# Patient Record
Sex: Female | Born: 1945 | Race: White | Hispanic: No | Marital: Married | Smoking: Never smoker
Health system: Southern US, Community
[De-identification: ages and names within clinical notes are randomized; demographics above are authoritative.]

## PROBLEM LIST (undated history)

## (undated) DIAGNOSIS — J189 Pneumonia, unspecified organism: Secondary | ICD-10-CM

## (undated) DIAGNOSIS — G4733 Obstructive sleep apnea (adult) (pediatric): Secondary | ICD-10-CM

## (undated) DIAGNOSIS — I509 Heart failure, unspecified: Secondary | ICD-10-CM

## (undated) DIAGNOSIS — J479 Bronchiectasis, uncomplicated: Secondary | ICD-10-CM

## (undated) DIAGNOSIS — M8580 Other specified disorders of bone density and structure, unspecified site: Secondary | ICD-10-CM

## (undated) DIAGNOSIS — N189 Chronic kidney disease, unspecified: Secondary | ICD-10-CM

## (undated) DIAGNOSIS — G4730 Sleep apnea, unspecified: Secondary | ICD-10-CM

## (undated) DIAGNOSIS — E119 Type 2 diabetes mellitus without complications: Secondary | ICD-10-CM

## (undated) DIAGNOSIS — C801 Malignant (primary) neoplasm, unspecified: Secondary | ICD-10-CM

## (undated) DIAGNOSIS — H35359 Cystoid macular degeneration, unspecified eye: Secondary | ICD-10-CM

## (undated) DIAGNOSIS — Z860100 Personal history of colon polyps, unspecified: Secondary | ICD-10-CM

## (undated) DIAGNOSIS — M810 Age-related osteoporosis without current pathological fracture: Secondary | ICD-10-CM

## (undated) DIAGNOSIS — I10 Essential (primary) hypertension: Secondary | ICD-10-CM

## (undated) DIAGNOSIS — K219 Gastro-esophageal reflux disease without esophagitis: Secondary | ICD-10-CM

## (undated) DIAGNOSIS — J45909 Unspecified asthma, uncomplicated: Secondary | ICD-10-CM

## (undated) DIAGNOSIS — E785 Hyperlipidemia, unspecified: Secondary | ICD-10-CM

## (undated) DIAGNOSIS — I5030 Unspecified diastolic (congestive) heart failure: Secondary | ICD-10-CM

## (undated) HISTORY — DX: Hyperlipidemia, unspecified: E78.5

## (undated) HISTORY — DX: Heart failure, unspecified: I50.9

## (undated) HISTORY — DX: Personal history of colon polyps, unspecified: Z86.0100

## (undated) HISTORY — DX: Age-related osteoporosis without current pathological fracture: M81.0

## (undated) HISTORY — DX: Type 2 diabetes mellitus without complications: E11.9

## (undated) HISTORY — DX: Cystoid macular degeneration, unspecified eye: H35.359

## (undated) HISTORY — DX: Unspecified diastolic (congestive) heart failure: I50.30

## (undated) HISTORY — DX: Other specified disorders of bone density and structure, unspecified site: M85.80

## (undated) HISTORY — DX: Malignant (primary) neoplasm, unspecified: C80.1

## (undated) HISTORY — DX: Personal history of colonic polyps: Z86.010

## (undated) HISTORY — DX: Bronchiectasis, uncomplicated: J47.9

## (undated) HISTORY — DX: Sleep apnea, unspecified: G47.30

## (undated) HISTORY — DX: Chronic kidney disease, unspecified: N18.9

## (undated) HISTORY — DX: Essential (primary) hypertension: I10

## (undated) HISTORY — DX: Gastro-esophageal reflux disease without esophagitis: K21.9

## (undated) HISTORY — DX: Obstructive sleep apnea (adult) (pediatric): G47.33

## (undated) HISTORY — PX: TUBAL LIGATION: SHX77

## (undated) HISTORY — PX: SHOULDER SURGERY: SHX246

## (undated) HISTORY — PX: FOOT SURGERY: SHX648

## (undated) HISTORY — DX: Pneumonia, unspecified organism: J18.9

## (undated) HISTORY — DX: Unspecified asthma, uncomplicated: J45.909

## (undated) HISTORY — PX: FOOT NEUROMA SURGERY: SHX646

---

## 1998-03-16 ENCOUNTER — Other Ambulatory Visit: Admission: RE | Admit: 1998-03-16 | Discharge: 1998-03-16 | Payer: Self-pay | Admitting: Obstetrics and Gynecology

## 1999-04-08 ENCOUNTER — Other Ambulatory Visit: Admission: RE | Admit: 1999-04-08 | Discharge: 1999-04-08 | Payer: Self-pay | Admitting: Obstetrics and Gynecology

## 1999-06-06 ENCOUNTER — Other Ambulatory Visit: Admission: RE | Admit: 1999-06-06 | Discharge: 1999-06-06 | Payer: Self-pay | Admitting: Obstetrics and Gynecology

## 2000-05-03 ENCOUNTER — Other Ambulatory Visit: Admission: RE | Admit: 2000-05-03 | Discharge: 2000-05-03 | Payer: Self-pay | Admitting: Obstetrics and Gynecology

## 2001-05-17 ENCOUNTER — Other Ambulatory Visit: Admission: RE | Admit: 2001-05-17 | Discharge: 2001-05-17 | Payer: Self-pay | Admitting: Obstetrics and Gynecology

## 2002-07-04 ENCOUNTER — Other Ambulatory Visit: Admission: RE | Admit: 2002-07-04 | Discharge: 2002-07-04 | Payer: Self-pay | Admitting: Obstetrics and Gynecology

## 2003-08-07 ENCOUNTER — Other Ambulatory Visit: Admission: RE | Admit: 2003-08-07 | Discharge: 2003-08-07 | Payer: Self-pay | Admitting: Obstetrics and Gynecology

## 2004-08-19 ENCOUNTER — Other Ambulatory Visit: Admission: RE | Admit: 2004-08-19 | Discharge: 2004-08-19 | Payer: Self-pay | Admitting: Obstetrics and Gynecology

## 2012-05-07 DIAGNOSIS — I10 Essential (primary) hypertension: Secondary | ICD-10-CM | POA: Diagnosis not present

## 2012-05-23 DIAGNOSIS — I10 Essential (primary) hypertension: Secondary | ICD-10-CM | POA: Diagnosis not present

## 2012-05-23 DIAGNOSIS — E782 Mixed hyperlipidemia: Secondary | ICD-10-CM | POA: Diagnosis not present

## 2012-05-23 DIAGNOSIS — K219 Gastro-esophageal reflux disease without esophagitis: Secondary | ICD-10-CM | POA: Diagnosis not present

## 2012-05-23 DIAGNOSIS — K5900 Constipation, unspecified: Secondary | ICD-10-CM | POA: Diagnosis not present

## 2012-05-23 DIAGNOSIS — Z79899 Other long term (current) drug therapy: Secondary | ICD-10-CM | POA: Diagnosis not present

## 2012-05-23 DIAGNOSIS — R635 Abnormal weight gain: Secondary | ICD-10-CM | POA: Diagnosis not present

## 2012-06-27 DIAGNOSIS — Z713 Dietary counseling and surveillance: Secondary | ICD-10-CM | POA: Diagnosis not present

## 2012-06-27 DIAGNOSIS — R635 Abnormal weight gain: Secondary | ICD-10-CM | POA: Diagnosis not present

## 2012-07-23 DIAGNOSIS — E669 Obesity, unspecified: Secondary | ICD-10-CM | POA: Diagnosis not present

## 2012-07-23 DIAGNOSIS — Z713 Dietary counseling and surveillance: Secondary | ICD-10-CM | POA: Diagnosis not present

## 2012-07-26 DIAGNOSIS — Z79899 Other long term (current) drug therapy: Secondary | ICD-10-CM | POA: Diagnosis not present

## 2012-07-26 DIAGNOSIS — Z124 Encounter for screening for malignant neoplasm of cervix: Secondary | ICD-10-CM | POA: Diagnosis not present

## 2012-07-26 DIAGNOSIS — K5900 Constipation, unspecified: Secondary | ICD-10-CM | POA: Diagnosis not present

## 2012-07-26 DIAGNOSIS — G4700 Insomnia, unspecified: Secondary | ICD-10-CM | POA: Diagnosis not present

## 2012-07-26 DIAGNOSIS — Z780 Asymptomatic menopausal state: Secondary | ICD-10-CM | POA: Diagnosis not present

## 2012-07-26 DIAGNOSIS — N951 Menopausal and female climacteric states: Secondary | ICD-10-CM | POA: Diagnosis not present

## 2012-07-26 DIAGNOSIS — E782 Mixed hyperlipidemia: Secondary | ICD-10-CM | POA: Diagnosis not present

## 2012-07-26 DIAGNOSIS — I10 Essential (primary) hypertension: Secondary | ICD-10-CM | POA: Diagnosis not present

## 2012-07-26 DIAGNOSIS — Z1211 Encounter for screening for malignant neoplasm of colon: Secondary | ICD-10-CM | POA: Diagnosis not present

## 2012-08-15 DIAGNOSIS — Z1231 Encounter for screening mammogram for malignant neoplasm of breast: Secondary | ICD-10-CM | POA: Diagnosis not present

## 2012-08-28 DIAGNOSIS — Z713 Dietary counseling and surveillance: Secondary | ICD-10-CM | POA: Diagnosis not present

## 2012-08-28 DIAGNOSIS — R635 Abnormal weight gain: Secondary | ICD-10-CM | POA: Diagnosis not present

## 2012-08-29 DIAGNOSIS — G4733 Obstructive sleep apnea (adult) (pediatric): Secondary | ICD-10-CM | POA: Diagnosis not present

## 2012-09-12 DIAGNOSIS — R6889 Other general symptoms and signs: Secondary | ICD-10-CM | POA: Diagnosis not present

## 2012-09-17 ENCOUNTER — Ambulatory Visit (INDEPENDENT_AMBULATORY_CARE_PROVIDER_SITE_OTHER): Payer: Medicare Other | Admitting: Pulmonary Disease

## 2012-09-17 ENCOUNTER — Encounter: Payer: Self-pay | Admitting: Pulmonary Disease

## 2012-09-17 ENCOUNTER — Ambulatory Visit (HOSPITAL_BASED_OUTPATIENT_CLINIC_OR_DEPARTMENT_OTHER): Payer: Medicare Other | Attending: Pulmonary Disease | Admitting: Radiology

## 2012-09-17 VITALS — BP 108/74 | HR 88 | Temp 97.7°F | Ht 63.0 in | Wt 183.0 lb

## 2012-09-17 VITALS — Ht 63.0 in | Wt 179.0 lb

## 2012-09-17 DIAGNOSIS — G4709 Other insomnia: Secondary | ICD-10-CM | POA: Insufficient documentation

## 2012-09-17 DIAGNOSIS — I10 Essential (primary) hypertension: Secondary | ICD-10-CM | POA: Insufficient documentation

## 2012-09-17 DIAGNOSIS — E669 Obesity, unspecified: Secondary | ICD-10-CM | POA: Diagnosis not present

## 2012-09-17 DIAGNOSIS — R5381 Other malaise: Secondary | ICD-10-CM | POA: Diagnosis not present

## 2012-09-17 DIAGNOSIS — R6889 Other general symptoms and signs: Secondary | ICD-10-CM | POA: Insufficient documentation

## 2012-09-17 DIAGNOSIS — G4733 Obstructive sleep apnea (adult) (pediatric): Secondary | ICD-10-CM | POA: Insufficient documentation

## 2012-09-17 DIAGNOSIS — G4730 Sleep apnea, unspecified: Secondary | ICD-10-CM | POA: Diagnosis not present

## 2012-09-17 DIAGNOSIS — R002 Palpitations: Secondary | ICD-10-CM | POA: Diagnosis not present

## 2012-09-17 DIAGNOSIS — R0683 Snoring: Secondary | ICD-10-CM

## 2012-09-17 DIAGNOSIS — R0989 Other specified symptoms and signs involving the circulatory and respiratory systems: Secondary | ICD-10-CM | POA: Insufficient documentation

## 2012-09-17 DIAGNOSIS — R0609 Other forms of dyspnea: Secondary | ICD-10-CM | POA: Insufficient documentation

## 2012-09-17 DIAGNOSIS — K219 Gastro-esophageal reflux disease without esophagitis: Secondary | ICD-10-CM | POA: Insufficient documentation

## 2012-09-17 DIAGNOSIS — G4700 Insomnia, unspecified: Secondary | ICD-10-CM | POA: Diagnosis not present

## 2012-09-17 HISTORY — DX: Obstructive sleep apnea (adult) (pediatric): G47.33

## 2012-09-17 NOTE — Assessment & Plan Note (Signed)
She reports snoring, sleep disruption, and daytime sleepiness.  She has history of refractory hypertension.  She reports recent home sleep study which was negative for sleep apnea, but showed oxygen desaturation >> I explained limitation of home sleep studies, and how these can underestimate presence of sleep apnea.  I am still concerned she could have sleep apnea.  We discussed how sleep apnea can affect various health problems including risks for hypertension, cardiovascular disease, and diabetes.  We also discussed how sleep disruption can increase risks for accident, such as while driving.  Weight loss as a means of improving sleep apnea was also reviewed.  Additional treatment options discussed were CPAP therapy, oral appliance, and surgical intervention.  Will get copy of her home sleep study.  Will also arrange for in lab sleep study to better assess presence of sleep apnea.

## 2012-09-17 NOTE — Patient Instructions (Signed)
Will arrange for sleep study Will call to arrange for follow up after sleep study reviewed 

## 2012-09-17 NOTE — Assessment & Plan Note (Signed)
Explained how nocturnal palpitations can be related to sleep apnea.  Will further assess her heart rhythm during sleep study.

## 2012-09-17 NOTE — Assessment & Plan Note (Signed)
Explained how nocturnal reflux can be associated with sleep apnea.

## 2012-09-17 NOTE — Assessment & Plan Note (Signed)
She is to continue elavil and melatonin for now.  Will re-assess her sleep aide requirements after review of her in lab sleep study.

## 2012-09-17 NOTE — Assessment & Plan Note (Signed)
Explained how sleep apnea can affect blood pressure control.

## 2012-09-17 NOTE — Progress Notes (Signed)
Chief Complaint  Patient presents with  . Sleep Consult    Epworth Score: 3.    History of Present Illness: Diana Hendrix is a 67 y.o. female for evaluation of sleep problems.  She has noticed trouble waking up feeling like her heart is beating hard.  This has been present for years, but seems to be happening more frequently.  She was also found to have refractory hypertension after developing a nose bleed.  She was seen by a nephrologist to further assess/treat her hypertension, and is now on multiple medications for her blood pressure control.  She does snore also.  There was concern she could have sleep apnea.  She had a home sleep study recently >> this is not available at present.  She was told her sleep apnea number was not high enough, but her oxygen level was low.  As a result she was referred to pulmonary/sleep medicine for further assessment.  She goes to sleep at 9 pm after taking elavil and melantonin at 730 pm.  She has been using these medications for years.  She falls asleep after 10 to 15 minutes.  She wakes up several times to use the bathroom.  She gets out of bed at 630 am.  She feels okay in the morning, but will get sleepy later in the afternoon >> this is not as much of a problem since she retired.  She denies morning headache.  She does not use anything to help her stay awake.  She will occasional take ativan during the day to help with her anxiety, but does not use this to help sleep.  She is unable to sleep on her back, and her mouth gets dry when she is asleep.  She denies sleep walking, sleep talking, bruxism, or nightmares.  There is no history of restless legs.  She denies sleep hallucinations, sleep paralysis, or cataplexy.  The Epworth score is 3 out of 24.  Yeily Link Stainback  has a past medical history of Hypertension; Hyperlipidemia; GERD (gastroesophageal reflux disease); and Macular edema, cystoid.  Toree Edling Lempke  has past surgical history that includes  Cesarean section; Tubal ligation; and Shoulder surgery.  Prior to Admission medications   Medication Sig Start Date End Date Taking? Authorizing Provider  amitriptyline (ELAVIL) 10 MG tablet Take 20 mg by mouth at bedtime.   Yes Historical Provider, MD  amLODipine (NORVASC) 5 MG tablet Take 5 mg by mouth daily.   Yes Historical Provider, MD  calcium carbonate (OS-CAL) 600 MG TABS tablet Take 600 mg by mouth 2 (two) times daily with a meal.   Yes Historical Provider, MD  Cholecalciferol (VITAMIN D-3) 1000 UNITS CAPS Take 2 capsules by mouth daily.   Yes Historical Provider, MD  fish oil-omega-3 fatty acids 1000 MG capsule Take 2 g by mouth daily.   Yes Historical Provider, MD  LORazepam (ATIVAN) 0.5 MG tablet Take 0.5 mg by mouth every 8 (eight) hours.   Yes Historical Provider, MD  Melatonin 5 MG TABS Take 1 tablet by mouth at bedtime.   Yes Historical Provider, MD  metoprolol succinate (TOPROL-XL) 50 MG 24 hr tablet Take 50 mg by mouth. Take with or immediately following a meal.   Yes Historical Provider, MD  olmesartan-hydrochlorothiazide (BENICAR HCT) 40-12.5 MG per tablet Take 1 tablet by mouth daily.   Yes Historical Provider, MD  pantoprazole (PROTONIX) 40 MG tablet Take 40 mg by mouth daily.   Yes Historical Provider, MD  simvastatin (ZOCOR) 10 MG  tablet Take 10 mg by mouth at bedtime.   Yes Historical Provider, MD  tobramycin-dexamethasone [facility]) ophthalmic solution 1 drop every 4 (four) hours while awake.   Yes Historical Provider, MD    No Known Allergies  Her family history includes Atrial fibrillation in her mother; Diabetes in her mother; Hypertension in her mother.  She  reports that she has never smoked. She has never used smokeless tobacco. She reports that she does not drink alcohol or use illicit drugs.   Physical Exam:  General - No distress ENT - No sinus tenderness, no oral exudate, MP 4, scalloped tongue, high arch palate, retrognathic, no LAN, no thyromegaly, TM  clear, pupils equal/reactive Cardiac - s1s2 regular, no murmur, pulses symmetric Chest - No wheeze/rales/dullness, good air entry, normal respiratory excursion Back - No focal tenderness Abd - Soft, non-tender, no organomegaly, + bowel sounds Ext - No edema Neuro - Normal strength, cranial nerves intact Skin - No rashes Psych - Normal mood, and behavior  Assessment:  Coralyn Helling, MD [facility] Pulmonary/Critical Care 09/17/2012, 3:57 PM Pager:  [phone] After 3pm call: [phone]

## 2012-09-17 NOTE — Progress Notes (Signed)
  Subjective:    Patient ID: Diana Hendrix, female    DOB: 05-02-1945, 67 y.o.   MRN: [id number]  HPI    Review of Systems  Constitutional: Negative for fever, chills, diaphoresis, activity change, appetite change, fatigue and unexpected weight change.  HENT: Negative for hearing loss, ear pain, nosebleeds, congestion, sore throat, facial swelling, rhinorrhea, sneezing, mouth sores, trouble swallowing, neck pain, neck stiffness, dental problem, voice change, postnasal drip, sinus pressure, tinnitus and ear discharge.   Eyes: Negative for photophobia, discharge, itching and visual disturbance.  Respiratory: Negative for apnea, cough, choking, chest tightness, shortness of breath, wheezing and stridor.   Cardiovascular: Negative for chest pain, palpitations and leg swelling.  Gastrointestinal: Negative for nausea, vomiting, abdominal pain, constipation, blood in stool and abdominal distention.  Genitourinary: Negative for dysuria, urgency, frequency, hematuria, flank pain, decreased urine volume and difficulty urinating.  Musculoskeletal: Negative for myalgias, back pain, joint swelling, arthralgias and gait problem.  Skin: Negative for color change, pallor and rash.  Neurological: Negative for dizziness, tremors, seizures, syncope, speech difficulty, weakness, light-headedness, numbness and headaches.  Hematological: Negative for adenopathy. Does not bruise/bleed easily.  Psychiatric/Behavioral: Negative for confusion, sleep disturbance and agitation. The patient is not nervous/anxious.        Objective:   Physical Exam        Assessment & Plan:

## 2012-09-19 DIAGNOSIS — G4733 Obstructive sleep apnea (adult) (pediatric): Secondary | ICD-10-CM

## 2012-09-19 NOTE — Procedures (Signed)
NAME:  Diana, Hendrix NO.:  [id number]  MEDICAL RECORD NO.:  [id number]          PATIENT TYPE:  OUT  LOCATION:  SLEEP CENTER                 FACILITY:  [facility]  PHYSICIAN:  Coralyn Helling, MD        DATE OF BIRTH:  Aug 24, 1945  DATE OF STUDY:  09/17/2012                           NOCTURNAL POLYSOMNOGRAM  REFERRING PHYSICIAN:  Coralyn Helling, MD  FACILITY:  [facility].  REFERRING PHYSICIAN:  Coralyn Helling, MD  INDICATION:  Ms. Simerson is a 67 year old female, who has a history of hypertension.  She also reports snoring, sleep disruption, and daytime sleepiness.  She is referred to sleep lab for evaluation of hypersomnia with obstructive sleep apnea.  Height is 5 feet 3 inches, weight is 139 pounds.  BMI is 32.  Neck size is 14.5 inches.  Medications are reviewed in her chart.  EPWORTH SLEEPINESS SCORE:  2.  SLEEP ARCHITECTURE:  Total recording time was 377 minutes.  Total sleep time was 262 minutes, sleep efficiency was 69%.  Sleep latency was 21 minutes.  REM latency was 253 minutes.  The patient was observed in all stages of sleep and she slept predominantly in the nonsupine position. Of note is that she had difficulty with sleep initiation and sleep maintenance due to respiratory events.  RESPIRATORY DATA:  The average respiratory rate was 14.  Moderate snoring was noted by the technician.  The respiratory disturbance index was 13.7.  There was 1 central apneic event.  The remainder of the events were obstructive in nature.  OXYGEN DATA:  The baseline oxygenation was 98%.  The oxygen saturation nadir was 90%.  The patient had the study conducted without the use of supplemental oxygen.  CARDIAC DATA:  The average heart rate was 52 and the rhythm strip showed sinus rhythm with occasional PVCs and PACs.  MOVEMENT/PARASOMNIA:  The periodic limb movement index was 0 and the patient had 1 restroom trip.  IMPRESSION:  This study shows evidence of mild  obstructive sleep apnea with a respiratory disturbance index of 13.7 and oxygen saturation nadir of 90%.  Additional therapeutic interventions include weight reduction, CPAP therapy, oral appliance, or surgical intervention.     Coralyn Helling, MD Diplomat, American Board of Sleep Medicine    VS/MEDQ  D:  09/19/2012 11:25:34  T:  09/19/2012 16:10:96  Job:  [id number]

## 2012-09-23 ENCOUNTER — Telehealth: Payer: Self-pay | Admitting: Pulmonary Disease

## 2012-09-23 NOTE — Telephone Encounter (Signed)
Pt returned call & can be reached at [phone].  Diana Hendrix

## 2012-09-23 NOTE — Telephone Encounter (Signed)
PSG 09/17/12 >> RDI 13.7, SpO2 low 905, PLMI 0, occasional PVC's and PAC's.  Will have my nurse inform pt that sleep study shows mild sleep apnea, and she needs ROV to discuss in more detail.

## 2012-09-23 NOTE — Telephone Encounter (Signed)
I spoke with pt and appt has been scheduled. Nothing further needed

## 2012-09-23 NOTE — Telephone Encounter (Signed)
lmtcb

## 2012-09-24 DIAGNOSIS — H04129 Dry eye syndrome of unspecified lacrimal gland: Secondary | ICD-10-CM | POA: Diagnosis not present

## 2012-09-24 DIAGNOSIS — H01009 Unspecified blepharitis unspecified eye, unspecified eyelid: Secondary | ICD-10-CM | POA: Diagnosis not present

## 2012-09-24 DIAGNOSIS — H2510 Age-related nuclear cataract, unspecified eye: Secondary | ICD-10-CM | POA: Diagnosis not present

## 2012-10-25 ENCOUNTER — Ambulatory Visit (INDEPENDENT_AMBULATORY_CARE_PROVIDER_SITE_OTHER): Payer: Medicare Other | Admitting: Pulmonary Disease

## 2012-10-25 ENCOUNTER — Encounter: Payer: Self-pay | Admitting: Pulmonary Disease

## 2012-10-25 VITALS — BP 112/72 | HR 60 | Temp 97.7°F | Ht 63.0 in | Wt 182.0 lb

## 2012-10-25 DIAGNOSIS — G4733 Obstructive sleep apnea (adult) (pediatric): Secondary | ICD-10-CM

## 2012-10-25 DIAGNOSIS — G4700 Insomnia, unspecified: Secondary | ICD-10-CM | POA: Diagnosis not present

## 2012-10-25 DIAGNOSIS — Z23 Encounter for immunization: Secondary | ICD-10-CM | POA: Diagnosis not present

## 2012-10-25 NOTE — Progress Notes (Signed)
Chief Complaint  Patient presents with  . Follow-up    Review sleep study results.    CC: Diana Hendrix, [facility] Nephrology  History of Present Illness: Diana Hendrix is a 67 y.o. female with OSA.  She is here to review her sleep study.  This shows mild sleep apnea.  She continues to have trouble with her sleep.  She continues to take ativan and elavil to help her sleep.  TESTS: PSG 09/17/12 >> RDI 13.7, SpO2 low 90%, PLMI 0, occasional PVC's and PAC's.   Diana Hendrix  has a past medical history of Hypertension; Hyperlipidemia; GERD (gastroesophageal reflux disease); and Macular edema, cystoid.  Diana Hendrix  has past surgical history that includes Cesarean section; Tubal ligation; and Shoulder surgery.  Prior to Admission medications   Medication Sig Start Date End Date Taking? Authorizing Provider  amitriptyline (ELAVIL) 10 MG tablet Take 20 mg by mouth at bedtime.   Yes Historical Provider, MD  amLODipine (NORVASC) 5 MG tablet Take 5 mg by mouth daily.   Yes Historical Provider, MD  calcium carbonate (OS-CAL) 600 MG TABS tablet Take 600 mg by mouth 2 (two) times daily with a meal.   Yes Historical Provider, MD  Cholecalciferol (VITAMIN D-3) 1000 UNITS CAPS Take 2 capsules by mouth daily.   Yes Historical Provider, MD  fish oil-omega-3 fatty acids 1000 MG capsule Take 2 g by mouth daily.   Yes Historical Provider, MD  LORazepam (ATIVAN) 0.5 MG tablet Take 0.5 mg by mouth every 8 (eight) hours.   Yes Historical Provider, MD  Melatonin 5 MG TABS Take 1 tablet by mouth at bedtime.   Yes Historical Provider, MD  metoprolol succinate (TOPROL-XL) 50 MG 24 hr tablet Take 50 mg by mouth 3 (three) times daily. Take with or immediately following a meal.   Yes Historical Provider, MD  olmesartan-hydrochlorothiazide (BENICAR HCT) 40-12.5 MG per tablet Take 1 tablet by mouth daily.   Yes Historical Provider, MD  pantoprazole (PROTONIX) 40 MG tablet Take 40 mg by mouth daily.   Yes  Historical Provider, MD  simvastatin (ZOCOR) 10 MG tablet Take 10 mg by mouth at bedtime.   Yes Historical Provider, MD  tobramycin-dexamethasone [facility]) ophthalmic solution 1 drop every 4 (four) hours while awake.   Yes Historical Provider, MD    No Known Allergies   Physical Exam:  General - No distress ENT - No sinus tenderness, no oral exudate, no LAN, MP 4, scalloped tongue, high arch palate, retrognathic Cardiac - s1s2 regular, no murmur Chest - No wheeze/rales/dullness Back - No focal tenderness Abd - Soft, non-tender Ext - No edema Neuro - Normal strength Skin - No rashes Psych - normal mood, and behavior   Assessment/Plan:  Coralyn Helling, MD [place] Pulmonary/Critical Care/Sleep Pager:  [phone]

## 2012-10-25 NOTE — Assessment & Plan Note (Signed)
Explained how some of her symptoms of insomnia could in fact be related to sleep apnea.  Will continue elavil and ativan for now.  Will re-assess her need for sleep aide after she is established on therapy for sleep apnea.

## 2012-10-25 NOTE — Assessment & Plan Note (Signed)
She has mild sleep apnea.  This is in the setting of refractory hypertension.  I have reviewed the recent sleep study results with the patient.  We discussed how sleep apnea can affect various health problems including risks for hypertension, cardiovascular disease, and diabetes.  We also discussed how sleep disruption can increase risks for accident, such as while driving.  Weight loss as a means of improving sleep apnea was also reviewed.  Additional treatment options discussed were CPAP therapy, oral appliance, and surgical intervention.  She would like to try arranging for oral appliance first.  Will arrange for referral to Dr. Althea Grimmer to assess for oral appliance to treat her obstructive sleep apnea.

## 2012-10-25 NOTE — Patient Instructions (Signed)
Will arrange for referral to Dr. Mark Katz to assess for oral appliance to treat obstructive sleep apnea  Follow up in 6 months 

## 2012-10-28 DIAGNOSIS — I10 Essential (primary) hypertension: Secondary | ICD-10-CM | POA: Diagnosis not present

## 2012-10-28 DIAGNOSIS — E7800 Pure hypercholesterolemia, unspecified: Secondary | ICD-10-CM | POA: Diagnosis not present

## 2012-10-28 DIAGNOSIS — N189 Chronic kidney disease, unspecified: Secondary | ICD-10-CM | POA: Diagnosis not present

## 2012-10-28 DIAGNOSIS — R300 Dysuria: Secondary | ICD-10-CM | POA: Diagnosis not present

## 2012-11-18 DIAGNOSIS — H2510 Age-related nuclear cataract, unspecified eye: Secondary | ICD-10-CM | POA: Diagnosis not present

## 2012-11-18 DIAGNOSIS — H02839 Dermatochalasis of unspecified eye, unspecified eyelid: Secondary | ICD-10-CM | POA: Diagnosis not present

## 2012-11-18 DIAGNOSIS — H04129 Dry eye syndrome of unspecified lacrimal gland: Secondary | ICD-10-CM | POA: Diagnosis not present

## 2012-11-26 DIAGNOSIS — H02839 Dermatochalasis of unspecified eye, unspecified eyelid: Secondary | ICD-10-CM | POA: Diagnosis not present

## 2012-11-26 DIAGNOSIS — H04129 Dry eye syndrome of unspecified lacrimal gland: Secondary | ICD-10-CM | POA: Diagnosis not present

## 2012-11-26 DIAGNOSIS — H2510 Age-related nuclear cataract, unspecified eye: Secondary | ICD-10-CM | POA: Diagnosis not present

## 2013-01-03 DIAGNOSIS — H2510 Age-related nuclear cataract, unspecified eye: Secondary | ICD-10-CM | POA: Diagnosis not present

## 2013-01-03 DIAGNOSIS — H269 Unspecified cataract: Secondary | ICD-10-CM | POA: Diagnosis not present

## 2013-01-24 DIAGNOSIS — H2510 Age-related nuclear cataract, unspecified eye: Secondary | ICD-10-CM | POA: Diagnosis not present

## 2013-01-24 DIAGNOSIS — H269 Unspecified cataract: Secondary | ICD-10-CM | POA: Diagnosis not present

## 2013-03-24 DIAGNOSIS — G4700 Insomnia, unspecified: Secondary | ICD-10-CM | POA: Diagnosis not present

## 2013-04-24 ENCOUNTER — Encounter: Payer: Self-pay | Admitting: Pulmonary Disease

## 2013-04-24 ENCOUNTER — Ambulatory Visit (INDEPENDENT_AMBULATORY_CARE_PROVIDER_SITE_OTHER): Payer: Medicare Other | Admitting: Pulmonary Disease

## 2013-04-24 VITALS — BP 118/72 | HR 60 | Ht 63.0 in | Wt 187.0 lb

## 2013-04-24 DIAGNOSIS — G4733 Obstructive sleep apnea (adult) (pediatric): Secondary | ICD-10-CM | POA: Diagnosis not present

## 2013-04-24 NOTE — Assessment & Plan Note (Signed)
Unfortunately she was unable to tolerate oral appliance.  We again discussed trying CPAP.  Also discussed how most of her nocturnal symptoms are likely related to sleep apnea.  Also discussed how her blood pressure control can be effected by sleep apnea.  She has f/u with her nephrologist next week, and would like to discuss with her first about how much treating sleep apnea can help her blood pressure control.  She will then call our office to inform whether she would want to try CPAP.  If she is agreeable to try CPAP, then will arrange for auto CPAP set up.

## 2013-04-24 NOTE — Progress Notes (Signed)
Chief Complaint  Patient presents with  . Sleep Apnea    Went to Dr. Ron Hendrix for oral appliance, has not been using due to it causing problems with her teeth    CC: Diana Hendrix, [facility] Nephrology  History of Present Illness: Diana Hendrix is a 68 y.o. female with OSA.  She was seen by Dr. Oneal Hendrix, and fitted for oral appliance.  She feels this helped her sleep.  Unfortunately she developed dental malalignment, and was advised by Dr. Ron Hendrix to stop using oral appliance.  She has started snoring again, and her husband thinks this is worse than before.  She also wakes up feeling like she is having palpitations, and wakes up hearing herself snore.  Her husband has sleep apnea, and uses CPAP > he has struggled with his set up, and she is concerned about whether she could use CPAP.  TESTS: PSG 09/17/12 >> RDI 13.7, SpO2 low 90%, PLMI 0, occasional PVC's and PAC's.  Diana Hendrix  has a past medical history of Hypertension; Hyperlipidemia; GERD (gastroesophageal reflux disease); Macular edema, cystoid; and OSA (obstructive sleep apnea) (09/17/2012).  Diana Hendrix  has past surgical history that includes Cesarean section; Tubal ligation; and Shoulder surgery.  Prior to Admission medications   Medication Sig Start Date End Date Taking? Authorizing Provider  amitriptyline (ELAVIL) 10 MG tablet Take 20 mg by mouth at bedtime.   Yes Historical Provider, MD  amLODipine (NORVASC) 5 MG tablet Take 5 mg by mouth daily.   Yes Historical Provider, MD  calcium carbonate (OS-CAL) 600 MG TABS tablet Take 600 mg by mouth 2 (two) times daily with a meal.   Yes Historical Provider, MD  Cholecalciferol (VITAMIN D-3) 1000 UNITS CAPS Take 2 capsules by mouth daily.   Yes Historical Provider, MD  fish oil-omega-3 fatty acids 1000 MG capsule Take 2 g by mouth daily.   Yes Historical Provider, MD  LORazepam (ATIVAN) 0.5 MG tablet Take 0.5 mg by mouth every 8 (eight) hours.   Yes Historical Provider, MD   Melatonin 5 MG TABS Take 1 tablet by mouth at bedtime.   Yes Historical Provider, MD  metoprolol succinate (TOPROL-XL) 50 MG 24 hr tablet Take 50 mg by mouth 3 (three) times daily. Take with or immediately following a meal.   Yes Historical Provider, MD  olmesartan-hydrochlorothiazide (BENICAR HCT) 40-12.5 MG per tablet Take 1 tablet by mouth daily.   Yes Historical Provider, MD  pantoprazole (PROTONIX) 40 MG tablet Take 40 mg by mouth daily.   Yes Historical Provider, MD  simvastatin (ZOCOR) 10 MG tablet Take 10 mg by mouth at bedtime.   Yes Historical Provider, MD  tobramycin-dexamethasone [facility]) ophthalmic solution 1 drop every 4 (four) hours while awake.   Yes Historical Provider, MD    No Known Allergies   Physical Exam:  General - No distress ENT - No sinus tenderness, no oral exudate, no LAN, MP 4, scalloped tongue, high arch palate, retrognathic Cardiac - s1s2 regular, no murmur Chest - No wheeze/rales/dullness Back - No focal tenderness Abd - Soft, non-tender Ext - No edema Neuro - Normal strength Skin - No rashes Psych - normal mood, and behavior   Assessment/Plan:  Diana Mires, MD Diana Hendrix Pulmonary/Critical Care/Sleep Pager:  [phone]

## 2013-04-24 NOTE — Patient Instructions (Signed)
Call once you have decide if you want to use CPAP Follow up in 3 months

## 2013-04-28 ENCOUNTER — Telehealth: Payer: Self-pay | Admitting: Pulmonary Disease

## 2013-04-28 DIAGNOSIS — G4733 Obstructive sleep apnea (adult) (pediatric): Secondary | ICD-10-CM

## 2013-04-28 DIAGNOSIS — N189 Chronic kidney disease, unspecified: Secondary | ICD-10-CM | POA: Diagnosis not present

## 2013-04-28 DIAGNOSIS — I10 Essential (primary) hypertension: Secondary | ICD-10-CM | POA: Diagnosis not present

## 2013-04-28 DIAGNOSIS — D649 Anemia, unspecified: Secondary | ICD-10-CM | POA: Diagnosis not present

## 2013-04-28 DIAGNOSIS — E039 Hypothyroidism, unspecified: Secondary | ICD-10-CM | POA: Diagnosis not present

## 2013-04-28 DIAGNOSIS — R300 Dysuria: Secondary | ICD-10-CM | POA: Diagnosis not present

## 2013-04-28 NOTE — Telephone Encounter (Signed)
Spoke with pt. She saw her Nephrologist and has decided to try CPAP. Advised her that we will send this message to VS and determine what pressure she should be set at.  VS - please advise. Thanks.

## 2013-04-28 NOTE — Telephone Encounter (Signed)
Please inform pt that I have sent order for Auto CPAP set up.

## 2013-04-29 DIAGNOSIS — R0902 Hypoxemia: Secondary | ICD-10-CM | POA: Diagnosis not present

## 2013-04-30 DIAGNOSIS — R0902 Hypoxemia: Secondary | ICD-10-CM | POA: Diagnosis not present

## 2013-05-07 ENCOUNTER — Telehealth: Payer: Self-pay | Admitting: Pulmonary Disease

## 2013-05-07 NOTE — Telephone Encounter (Signed)
Note   Order faxed to [facility]                          Type Date User    Provider Comments 04/28/2013 4:03 PM SOOD, Elisabeth Cara         Summary    Provider Comments         Note    New CPAP set. Please arrange for auto CPAP range 5 to 15 cm H2O with heated humidity and mask of choice. Please have download sent after two weeks use.       I spoke with rep at [facility] who stated that they needed OV notes from when patient was first seen and the most recent OV notes showing patient still needs CPAP therapy. I have faxed the OV notes from 09/17/12 and 04/24/13. They said the patient should hear from their office within a week at the latest.   I had to leave a message for patient to call us back to inform her of this.

## 2013-05-08 NOTE — Telephone Encounter (Signed)
Noted  

## 2013-05-08 NOTE — Telephone Encounter (Signed)
Pt returned call to triage.

## 2013-05-08 NOTE — Telephone Encounter (Signed)
Called spoke w/ pt. Made her aware. She reports her PCP ordered an ONO and had this done. Showed O2 level dropped below 80's several times every night. PCP did not order O2 since she told them we were ordering CPAP for her. She reports they were suppose to send [place] the reports yesterday. Will forward to Dr. Halford Chessman and Ria Comment so they can look out for results on pt. thanks

## 2013-05-27 DIAGNOSIS — I10 Essential (primary) hypertension: Secondary | ICD-10-CM | POA: Diagnosis not present

## 2013-05-27 DIAGNOSIS — E7800 Pure hypercholesterolemia, unspecified: Secondary | ICD-10-CM | POA: Diagnosis not present

## 2013-05-27 DIAGNOSIS — K219 Gastro-esophageal reflux disease without esophagitis: Secondary | ICD-10-CM | POA: Diagnosis not present

## 2013-05-27 DIAGNOSIS — G4700 Insomnia, unspecified: Secondary | ICD-10-CM | POA: Diagnosis not present

## 2013-05-29 ENCOUNTER — Telehealth: Payer: Self-pay | Admitting: Pulmonary Disease

## 2013-05-29 NOTE — Telephone Encounter (Signed)
Pt is aware of results. 

## 2013-05-29 NOTE — Telephone Encounter (Signed)
Auto CPAP 05/14/13 to 05/27/13 >> used on 14 of 14 nights with average 8 hrs 10 min. Average AHI 1.9 with median CPAP 9 cm H2O and 95 th percentile CPAP 12 cm H2O.  Will have my nurse inform pt that CPAP report looks good.  No change to current set up.  Will discuss in more detail at next ROV which needs to be two months after initial CPAP set up.

## 2013-06-30 DIAGNOSIS — I10 Essential (primary) hypertension: Secondary | ICD-10-CM | POA: Diagnosis not present

## 2013-06-30 DIAGNOSIS — N189 Chronic kidney disease, unspecified: Secondary | ICD-10-CM | POA: Diagnosis not present

## 2013-07-04 ENCOUNTER — Telehealth: Payer: Self-pay | Admitting: Pulmonary Disease

## 2013-07-04 NOTE — Telephone Encounter (Signed)
Called spoke with Jeneen Rinks. He reports the sleep study they received VS signature was not on it. He needs this faxed to [phone]. I have printed off sleep study and will have libby look over this with me.

## 2013-07-04 NOTE — Telephone Encounter (Signed)
refaxed to final report to [facility]

## 2013-07-04 NOTE — Telephone Encounter (Signed)
Per libby send to [facility].

## 2013-08-03 ENCOUNTER — Encounter: Payer: Self-pay | Admitting: Pulmonary Disease

## 2013-08-08 ENCOUNTER — Ambulatory Visit (INDEPENDENT_AMBULATORY_CARE_PROVIDER_SITE_OTHER): Payer: Medicare Other | Admitting: Pulmonary Disease

## 2013-08-08 ENCOUNTER — Encounter: Payer: Self-pay | Admitting: Pulmonary Disease

## 2013-08-08 VITALS — BP 142/88 | HR 65 | Temp 98.0°F | Ht 63.0 in | Wt 192.0 lb

## 2013-08-08 DIAGNOSIS — G4733 Obstructive sleep apnea (adult) (pediatric): Secondary | ICD-10-CM | POA: Diagnosis not present

## 2013-08-08 NOTE — Patient Instructions (Signed)
Follow up in 1 year.

## 2013-08-08 NOTE — Assessment & Plan Note (Signed)
She is compliant with CPAP and reports benefit.  Her main issue is with mouth dryness.  She can continue biotene gel.  Discussed humidifier adjustment and trying chin strap or full face mask.  She can use nasal irrigation and nasacort prn for sinus congestion.

## 2013-08-08 NOTE — Progress Notes (Signed)
Chief Complaint  Patient presents with  . Follow-up    Pt c/o increased dry mouth/throat in AM. Pt also states that she has a cold which is causing problems with CPPA. Pt states that the pressure seems to be too much--air is being forced out of mouth. Pt reports she has d/c all sleep aids.    CC: Diana Hendrix, [facility] Nephrology  History of Present Illness: Diana Hendrix is a 68 y.o. female with OSA.  She gets dry mouth.  She also gets sinus congestion.  She has nasal mask >> she does not think she could use full face mask.  TESTS: PSG 09/17/12 >> RDI 13.7, SpO2 low 90%, PLMI 0, occasional PVC's and PAC's. Auto CPAP 06/29/13 to 07/28/13 >> used on 29 of 30 nights with average 8 hrs and 27 min.  Average AHI is 3.1 with median CPAP 11 cm H2O and 95 th percentile CPAP 14 cm H2O  PMHx, PSHx, Medications, Allergies, Fhx, Shx reviewed.  Physical Exam:  General - No distress ENT - No sinus tenderness, no oral exudate, no LAN, MP 4, scalloped tongue, high arch palate, retrognathic Cardiac - s1s2 regular, no murmur Chest - No wheeze/rales/dullness Back - No focal tenderness Abd - Soft, non-tender Ext - No edema Neuro - Normal strength Skin - No rashes Psych - normal mood, and behavior   Assessment/Plan:  Chesley Mires, MD [place] Pulmonary/Critical Care/Sleep Pager:  [phone]

## 2013-09-17 ENCOUNTER — Encounter: Payer: Self-pay | Admitting: Internal Medicine

## 2013-09-17 ENCOUNTER — Ambulatory Visit (INDEPENDENT_AMBULATORY_CARE_PROVIDER_SITE_OTHER): Payer: Medicare Other | Admitting: Internal Medicine

## 2013-09-17 ENCOUNTER — Telehealth: Payer: Self-pay | Admitting: *Deleted

## 2013-09-17 ENCOUNTER — Other Ambulatory Visit (INDEPENDENT_AMBULATORY_CARE_PROVIDER_SITE_OTHER): Payer: Medicare Other

## 2013-09-17 VITALS — BP 114/82 | HR 65 | Temp 97.8°F | Resp 16 | Ht 63.0 in | Wt 190.0 lb

## 2013-09-17 DIAGNOSIS — K649 Unspecified hemorrhoids: Secondary | ICD-10-CM

## 2013-09-17 DIAGNOSIS — R002 Palpitations: Secondary | ICD-10-CM

## 2013-09-17 DIAGNOSIS — R635 Abnormal weight gain: Secondary | ICD-10-CM

## 2013-09-17 DIAGNOSIS — E782 Mixed hyperlipidemia: Secondary | ICD-10-CM | POA: Insufficient documentation

## 2013-09-17 DIAGNOSIS — I10 Essential (primary) hypertension: Secondary | ICD-10-CM | POA: Diagnosis not present

## 2013-09-17 DIAGNOSIS — E669 Obesity, unspecified: Secondary | ICD-10-CM | POA: Diagnosis not present

## 2013-09-17 DIAGNOSIS — E66812 Obesity, class 2: Secondary | ICD-10-CM | POA: Insufficient documentation

## 2013-09-17 DIAGNOSIS — Z23 Encounter for immunization: Secondary | ICD-10-CM | POA: Diagnosis not present

## 2013-09-17 DIAGNOSIS — G4733 Obstructive sleep apnea (adult) (pediatric): Secondary | ICD-10-CM

## 2013-09-17 DIAGNOSIS — Z1211 Encounter for screening for malignant neoplasm of colon: Secondary | ICD-10-CM | POA: Diagnosis not present

## 2013-09-17 DIAGNOSIS — E785 Hyperlipidemia, unspecified: Secondary | ICD-10-CM | POA: Insufficient documentation

## 2013-09-17 DIAGNOSIS — K219 Gastro-esophageal reflux disease without esophagitis: Secondary | ICD-10-CM

## 2013-09-17 LAB — CBC
HEMATOCRIT: 40 % (ref 36.0–46.0)
HEMOGLOBIN: 13.5 g/dL (ref 12.0–15.0)
MCHC: 33.7 g/dL (ref 30.0–36.0)
MCV: 91 fl (ref 78.0–100.0)
Platelets: 259 10*3/uL (ref 150.0–400.0)
RBC: 4.4 Mil/uL (ref 3.87–5.11)
RDW: 13.1 % (ref 11.5–15.5)
WBC: 5.2 10*3/uL (ref 4.0–10.5)

## 2013-09-17 LAB — LIPID PANEL
CHOL/HDL RATIO: 4
Cholesterol: 169 mg/dL (ref 0–200)
HDL: 47.6 mg/dL (ref >39.00)
LDL Cholesterol: 95 mg/dL (ref 0–99)
NONHDL: 121.4
Triglycerides: 131 mg/dL (ref 0.0–149.0)
VLDL: 26.2 mg/dL (ref 0.0–40.0)

## 2013-09-17 LAB — BASIC METABOLIC PANEL
BUN: 11 mg/dL (ref 6–23)
CALCIUM: 9.6 mg/dL (ref 8.4–10.5)
CO2: 31 mEq/L (ref 19–32)
Chloride: 96 mEq/L (ref 96–112)
Creatinine, Ser: 0.9 mg/dL (ref 0.4–1.2)
GFR: 69.92 mL/min (ref >60.00)
GLUCOSE: 95 mg/dL (ref 70–99)
Potassium: 3.9 mEq/L (ref 3.5–5.1)
SODIUM: 133 meq/L — AB (ref 135–145)

## 2013-09-17 LAB — TSH: TSH: 1.2 u[IU]/mL (ref 0.35–4.50)

## 2013-09-17 MED ORDER — PANTOPRAZOLE SODIUM 40 MG PO TBEC: 40.0000 mg | DELAYED_RELEASE_TABLET | Freq: Every day | ORAL | Status: DC

## 2013-09-17 MED ORDER — POLYETHYLENE GLYCOL 3350 17 G PO PACK: 17.0000 g | PACK | Freq: Every day | ORAL | Status: DC

## 2013-09-17 MED ORDER — AMITRIPTYLINE HCL 10 MG PO TABS
10.0000 mg | ORAL_TABLET | Freq: Every evening | ORAL | Status: DC | PRN
Start: 2013-09-17 — End: 2014-07-18

## 2013-09-17 MED ORDER — CITALOPRAM HYDROBROMIDE 10 MG PO TABS: 10.0000 mg | ORAL_TABLET | Freq: Every day | ORAL | Status: DC

## 2013-09-17 MED ORDER — SIMVASTATIN 10 MG PO TABS: 10.0000 mg | ORAL_TABLET | Freq: Every day | ORAL | Status: DC

## 2013-09-17 MED ORDER — METOPROLOL SUCCINATE ER 50 MG PO TB24: 50.0000 mg | ORAL_TABLET | Freq: Every day | ORAL | Status: DC

## 2013-09-17 NOTE — Assessment & Plan Note (Signed)
Filed Vitals:   09/17/13 0856  BP: 114/82  Pulse: 65  Temp: 97.8 F (36.6 C)  TempSrc: Oral  Resp: 16  Height: 5\' 3"  (1.6 m)  Weight: 190 lb 0.6 oz (86.202 kg)  SpO2: 97%   BP good at today's visit and will continue norvasc 5 mg daily, toprol-xl 50 mg daily, benicar 40/12.5 mg daily. Can try to titrate down if able in the future with some mild weight loss. Check BMP at today's visit and adjust meds if needed.

## 2013-09-17 NOTE — Assessment & Plan Note (Signed)
Sounds to be related to hypoxic nocturnal awakenings and have not been present since starting CPAP.

## 2013-09-17 NOTE — Progress Notes (Signed)
Pre visit review using our clinic review tool, if applicable. No additional management support is needed unless otherwise documented below in the visit note. 

## 2013-09-17 NOTE — Patient Instructions (Signed)
We will send you to a nutritionist to help with getting your diet on track. Also, work on going to [facility] 5 times per week or going on a walk or doing activity at home days you are not going to [employer].   We will check some blood tests today to check on your thyroid and kidneys.   Come back in about 6-12 months or sooner if you are having problems.   Exercise to Lose Weight Exercise and a healthy diet may help you lose weight. Your doctor may suggest specific exercises. EXERCISE IDEAS AND TIPS  Choose low-cost things you enjoy doing, such as walking, bicycling, or exercising to workout videos.  Take stairs instead of the elevator.  Walk during your lunch break.  Park your car further away from work or school.  Go to a gym or an exercise class.  Start with 5 to 10 minutes of exercise each day. Build up to 30 minutes of exercise 4 to 6 days a week.  Wear shoes with good support and comfortable clothes.  Stretch before and after working out.  Work out until you breathe harder and your heart beats faster.  Drink extra water when you exercise.  Do not do so much that you hurt yourself, feel dizzy, or get very short of breath. Exercises that burn about 150 calories:  Running 1  miles in 15 minutes.  Playing volleyball for 45 to 60 minutes.  Washing and waxing a car for 45 to 60 minutes.  Playing touch football for 45 minutes.  Walking 1  miles in 35 minutes.  Pushing a stroller 1  miles in 30 minutes.  Playing basketball for 30 minutes.  Raking leaves for 30 minutes.  Bicycling 5 miles in 30 minutes.  Walking 2 miles in 30 minutes.  Dancing for 30 minutes.  Shoveling snow for 15 minutes.  Swimming laps for 20 minutes.  Walking up stairs for 15 minutes.  Bicycling 4 miles in 15 minutes.  Gardening for 30 to 45 minutes.  Jumping rope for 15 minutes.  Washing windows or floors for 45 to 60 minutes. Document Released: 02/04/2010 Document Revised: 03/27/2011  Document Reviewed: 02/04/2010 [facility] Patient Information 2015 [place], [place]. This information is not intended to replace advice given to you by your health care provider. Make sure you discuss any questions you have with your health care provider.

## 2013-09-17 NOTE — Progress Notes (Signed)
   Subjective:    Patient ID: Diana Hendrix, female    DOB: [birth year], 68 y.o.   MRN: [id number]  HPI The patient is coming to be a new patient today. She is a 68 YO female with PMH of OSA, HTN, hyperlipidemia, GERD, constipation. 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 She does not go to [facility] as often since it is now further from her house. She denies any chest pains, SOB. She is now using CPAP for her mild OSA to help with her blood pressure. She is due for mammogram and colonoscopy. She would also like to see a nutritionist to help with her diet. She had seen one in the past but did not feel it was often enough to help her stay on track. She would like to have the flu shot today. She has had pap smears in the past which were normal. She uses miralax every day to help her bowel movements stay regular. She thinks that before she had her CPAP she would wake up at night with some heart racing however since starting CPAP she has not noticed any heart racing during the night and no problems with activity.    Review of Systems  Constitutional: Positive for activity change and fatigue. Negative for fever, chills, diaphoresis, appetite change and unexpected weight change.       Since retirement less physical activity  HENT: Positive for congestion. Negative for dental problem, drooling, nosebleeds, postnasal drip, sinus pressure and sore throat.        With CPAP  Respiratory: Negative for cough, chest tightness, shortness of breath and wheezing.   Endocrine: Negative for cold intolerance, heat intolerance, polydipsia, polyphagia and polyuria.  Genitourinary: Negative for dysuria, frequency and difficulty urinating.  Musculoskeletal: Negative for arthralgias and back pain.  Skin: Negative for color change, pallor, rash and wound.  Neurological: Negative for dizziness, weakness, light-headedness, numbness and  headaches.       Objective:   Physical Exam  Nursing note and vitals reviewed. Constitutional: She is oriented to person, place, and time. She appears well-developed and well-nourished. No distress.  Overweight  HENT:  Head: Normocephalic.  Eyes: EOM are normal.  Neck: Normal range of motion. Neck supple. No JVD present. No tracheal deviation present. No thyromegaly present.  Cardiovascular: Normal rate and regular rhythm.   No murmur heard. Pulmonary/Chest: Effort normal and breath sounds normal. No respiratory distress. She has no wheezes. She has no rales.  Abdominal: Soft. Bowel sounds are normal. She exhibits no distension. There is no tenderness. There is no rebound and no guarding.  Musculoskeletal: She exhibits no tenderness.  Neurological: She is alert and oriented to person, place, and time.  Skin: Skin is warm and dry. She is not diaphoretic.  Good pedal pulses      Assessment & Plan:   Referral placed for nutrtion for her obesity and GI for her screening colonoscopy. Also would ask GI to address her painful hemorrhoid.

## 2013-09-17 NOTE — Telephone Encounter (Signed)
Pt stated md referred her to a nutritionist and they did call her but medicare does not cover. So pt is wanting md advisement on what she can do. Inform pt md has already left for today will give her a call back tomorrow once she responds...Diana Hendrix

## 2013-09-17 NOTE — Assessment & Plan Note (Signed)
Mild to moderate OSA but has felt better with CPAP and advised her to continue working with it. She does have some congestion and is working with humidity.

## 2013-09-17 NOTE — Assessment & Plan Note (Signed)
Patient has gained weight since retirement and especially in the last 6 months. Will check thyroid and also advised her of exercises she can do at home since the gym is not close to her house anymore. Also referred to medical nutrition for some dietary training as she states she got benefit from this kind of help before but could use a refresher.

## 2013-09-17 NOTE — Assessment & Plan Note (Signed)
Patient has a hemorrhoid that is paining her more lately that was noticed at time of last colonoscopy (5 years ago) as she is due for colonoscopy will ask GI to address her hemorrhoid as well.

## 2013-09-17 NOTE — Assessment & Plan Note (Signed)
Patient is taking protonix 40 mg daily. Refilled at today's visit.

## 2013-09-17 NOTE — Assessment & Plan Note (Signed)
Checking lipid panel today and adjust zocor if needed.

## 2013-09-18 NOTE — Telephone Encounter (Signed)
Notified pt with md response.../lmb 

## 2013-09-18 NOTE — Telephone Encounter (Signed)
Could you tell her I will look into other resources for the nutrition but she may be able to attend the free weight loss seminars at [facility] long and  for her to continue working to increase her exercise and work on smaller portions at home.   Thanks, Dr. Doug Sou

## 2013-09-23 ENCOUNTER — Telehealth: Payer: Self-pay | Admitting: *Deleted

## 2013-09-23 MED ORDER — METOPROLOL SUCCINATE ER 50 MG PO TB24: 50.0000 mg | ORAL_TABLET | Freq: Two times a day (BID) | ORAL | Status: DC

## 2013-09-23 NOTE — Telephone Encounter (Signed)
Left msg on triage stating Dr. Doug Sou sent in refill on her metoprolol but she only sent in for 30 pills. Pt states she take metoprolol twice a day. Requesting updated script to be sent to pharmacy. Called pt inform her will resend to [facility]...Diana Hendrix

## 2013-09-29 DIAGNOSIS — Z1231 Encounter for screening mammogram for malignant neoplasm of breast: Secondary | ICD-10-CM | POA: Diagnosis not present

## 2013-10-15 ENCOUNTER — Telehealth: Payer: Self-pay | Admitting: Internal Medicine

## 2013-10-15 NOTE — Telephone Encounter (Signed)
Rec'd from Ann Held MD forward 24 pages to [place]

## 2013-10-16 DIAGNOSIS — Z86010 Personal history of colonic polyps: Secondary | ICD-10-CM | POA: Diagnosis not present

## 2013-10-16 DIAGNOSIS — K5900 Constipation, unspecified: Secondary | ICD-10-CM | POA: Diagnosis not present

## 2013-10-16 DIAGNOSIS — K648 Other hemorrhoids: Secondary | ICD-10-CM | POA: Diagnosis not present

## 2013-10-16 DIAGNOSIS — Z1211 Encounter for screening for malignant neoplasm of colon: Secondary | ICD-10-CM | POA: Diagnosis not present

## 2013-10-29 DIAGNOSIS — K219 Gastro-esophageal reflux disease without esophagitis: Secondary | ICD-10-CM | POA: Diagnosis not present

## 2013-10-29 DIAGNOSIS — Z1211 Encounter for screening for malignant neoplasm of colon: Secondary | ICD-10-CM | POA: Diagnosis not present

## 2013-10-29 DIAGNOSIS — K648 Other hemorrhoids: Secondary | ICD-10-CM | POA: Diagnosis not present

## 2013-10-29 DIAGNOSIS — D122 Benign neoplasm of ascending colon: Secondary | ICD-10-CM | POA: Diagnosis not present

## 2013-10-29 DIAGNOSIS — K5730 Diverticulosis of large intestine without perforation or abscess without bleeding: Secondary | ICD-10-CM | POA: Diagnosis not present

## 2013-10-29 DIAGNOSIS — Z79899 Other long term (current) drug therapy: Secondary | ICD-10-CM | POA: Diagnosis not present

## 2013-10-29 DIAGNOSIS — Z86010 Personal history of colonic polyps: Secondary | ICD-10-CM | POA: Diagnosis not present

## 2013-10-29 DIAGNOSIS — I10 Essential (primary) hypertension: Secondary | ICD-10-CM | POA: Diagnosis not present

## 2013-10-29 DIAGNOSIS — K5900 Constipation, unspecified: Secondary | ICD-10-CM | POA: Diagnosis not present

## 2013-10-29 HISTORY — PX: COLONOSCOPY: SHX174

## 2013-10-29 LAB — HM COLONOSCOPY

## 2013-11-03 DIAGNOSIS — D649 Anemia, unspecified: Secondary | ICD-10-CM | POA: Diagnosis not present

## 2013-11-03 DIAGNOSIS — I10 Essential (primary) hypertension: Secondary | ICD-10-CM | POA: Diagnosis not present

## 2013-11-03 DIAGNOSIS — E780 Pure hypercholesterolemia: Secondary | ICD-10-CM | POA: Diagnosis not present

## 2013-11-03 DIAGNOSIS — E039 Hypothyroidism, unspecified: Secondary | ICD-10-CM | POA: Diagnosis not present

## 2013-11-03 DIAGNOSIS — N189 Chronic kidney disease, unspecified: Secondary | ICD-10-CM | POA: Diagnosis not present

## 2013-11-15 ENCOUNTER — Encounter: Payer: Self-pay | Admitting: *Deleted

## 2014-01-22 ENCOUNTER — Telehealth: Payer: Self-pay | Admitting: Internal Medicine

## 2014-01-26 NOTE — Telephone Encounter (Signed)
Please review to see error.

## 2014-01-27 DIAGNOSIS — H04123 Dry eye syndrome of bilateral lacrimal glands: Secondary | ICD-10-CM | POA: Diagnosis not present

## 2014-01-27 DIAGNOSIS — Z961 Presence of intraocular lens: Secondary | ICD-10-CM | POA: Diagnosis not present

## 2014-01-27 NOTE — Telephone Encounter (Signed)
Patient called back.  States Pharmacy has not received script.

## 2014-01-28 ENCOUNTER — Other Ambulatory Visit: Payer: Self-pay | Admitting: Geriatric Medicine

## 2014-01-28 MED ORDER — POLYETHYLENE GLYCOL 3350 17 G PO PACK: 17.0000 g | PACK | Freq: Every day | ORAL | Status: DC

## 2014-01-28 NOTE — Telephone Encounter (Signed)
Spoke with pharmacy. Rx is ready.

## 2014-02-03 DIAGNOSIS — E780 Pure hypercholesterolemia: Secondary | ICD-10-CM | POA: Diagnosis not present

## 2014-02-03 DIAGNOSIS — R309 Painful micturition, unspecified: Secondary | ICD-10-CM | POA: Diagnosis not present

## 2014-02-03 DIAGNOSIS — D649 Anemia, unspecified: Secondary | ICD-10-CM | POA: Diagnosis not present

## 2014-02-03 DIAGNOSIS — E119 Type 2 diabetes mellitus without complications: Secondary | ICD-10-CM | POA: Diagnosis not present

## 2014-02-03 DIAGNOSIS — I10 Essential (primary) hypertension: Secondary | ICD-10-CM | POA: Diagnosis not present

## 2014-02-03 DIAGNOSIS — N189 Chronic kidney disease, unspecified: Secondary | ICD-10-CM | POA: Diagnosis not present

## 2014-03-08 ENCOUNTER — Other Ambulatory Visit: Payer: Self-pay | Admitting: Internal Medicine

## 2014-03-11 DIAGNOSIS — H43811 Vitreous degeneration, right eye: Secondary | ICD-10-CM | POA: Diagnosis not present

## 2014-03-18 ENCOUNTER — Encounter: Payer: Self-pay | Admitting: Internal Medicine

## 2014-03-18 ENCOUNTER — Ambulatory Visit (INDEPENDENT_AMBULATORY_CARE_PROVIDER_SITE_OTHER): Payer: Medicare Other | Admitting: Internal Medicine

## 2014-03-18 VITALS — BP 126/64 | HR 62 | Temp 98.0°F | Resp 16 | Ht 63.0 in | Wt 187.0 lb

## 2014-03-18 DIAGNOSIS — H9192 Unspecified hearing loss, left ear: Secondary | ICD-10-CM

## 2014-03-18 DIAGNOSIS — Z0000 Encounter for general adult medical examination without abnormal findings: Secondary | ICD-10-CM

## 2014-03-18 DIAGNOSIS — I10 Essential (primary) hypertension: Secondary | ICD-10-CM

## 2014-03-18 DIAGNOSIS — H9193 Unspecified hearing loss, bilateral: Secondary | ICD-10-CM | POA: Diagnosis not present

## 2014-03-18 NOTE — Patient Instructions (Addendum)
We have put in for the hearing test and you should get a call about scheduling that.   Keep taking the 1 and a half of the metoprolol and if you keep up the good work with weight loss we may be able to decrease the dose even further.   Exercise to Lose Weight Exercise and a healthy diet may help you lose weight. Your doctor may suggest specific exercises. EXERCISE IDEAS AND TIPS  Choose low-cost things you enjoy doing, such as walking, bicycling, or exercising to workout videos.  Take stairs instead of the elevator.  Walk during your lunch break.  Park your car further away from work or school.  Go to a gym or an exercise class.  Start with 5 to 10 minutes of exercise each day. Build up to 30 minutes of exercise 4 to 6 days a week.  Wear shoes with good support and comfortable clothes.  Stretch before and after working out.  Work out until you breathe harder and your heart beats faster.  Drink extra water when you exercise.  Do not do so much that you hurt yourself, feel dizzy, or get very short of breath. Exercises that burn about 150 calories:  Running 1  miles in 15 minutes.  Playing volleyball for 45 to 60 minutes.  Washing and waxing a car for 45 to 60 minutes.  Playing touch football for 45 minutes.  Walking 1  miles in 35 minutes.  Pushing a stroller 1  miles in 30 minutes.  Playing basketball for 30 minutes.  Raking leaves for 30 minutes.  Bicycling 5 miles in 30 minutes.  Walking 2 miles in 30 minutes.  Dancing for 30 minutes.  Shoveling snow for 15 minutes.  Swimming laps for 20 minutes.  Walking up stairs for 15 minutes.  Bicycling 4 miles in 15 minutes.  Gardening for 30 to 45 minutes.  Jumping rope for 15 minutes.  Washing windows or floors for 45 to 60 minutes. Document Released: 02/04/2010 Document Revised: 03/27/2011 Document Reviewed: 02/04/2010 [facility] Patient Information 2015 [place], [place]. This information is not intended to  replace advice given to you by your health care provider. Make sure you discuss any questions you have with your health care provider.

## 2014-03-18 NOTE — Assessment & Plan Note (Signed)
Doing great and able to decrease her metoprolol to 1.5 pills per day, continue other regimen as on medication list. If she continues with weight loss may be able to decrease even further. See back in 6-12 months. Recent BMP normal and no need for one today.

## 2014-03-18 NOTE — Progress Notes (Signed)
   Subjective:    Patient ID: Diana Hendrix, female    DOB: 1945/12/26, 69 y.o.   MRN: [id number]  HPI The patient is a 69 YO female who is here for follow up of her blood pressure. She has had this for many years and has been stable on her medicines for some time. She was going to try to lose some weight to see if we could go down on her medications. She denies any headaches, chest pains, pressure. Denies any side effects from the medication or lightheadedness.   Review of Systems  Constitutional: Positive for activity change. Negative for fever, chills, diaphoresis, appetite change and unexpected weight change.       Exercising more lately.   HENT: Negative for dental problem, drooling, nosebleeds, postnasal drip, sinus pressure and sore throat.        With CPAP  Respiratory: Negative for cough, chest tightness, shortness of breath and wheezing.   Endocrine: Negative for cold intolerance, heat intolerance, polydipsia, polyphagia and polyuria.  Genitourinary: Negative for dysuria, frequency and difficulty urinating.  Musculoskeletal: Negative for back pain and arthralgias.  Skin: Negative for color change, pallor, rash and wound.  Neurological: Negative for dizziness, weakness, light-headedness, numbness and headaches.      Objective:   Physical Exam  Constitutional: She is oriented to person, place, and time. She appears well-developed and well-nourished. No distress.  Overweight  HENT:  Head: Normocephalic.  Eyes: EOM are normal.  Neck: Normal range of motion. Neck supple. No JVD present. No tracheal deviation present. No thyromegaly present.  Cardiovascular: Normal rate and regular rhythm.   No murmur heard. Carotids without bruits  Pulmonary/Chest: Effort normal and breath sounds normal. No respiratory distress. She has no wheezes. She has no rales.  Abdominal: Soft. Bowel sounds are normal. She exhibits no distension. There is no tenderness. There is no rebound and no  guarding.  Musculoskeletal: She exhibits no tenderness.  Neurological: She is alert and oriented to person, place, and time.  Skin: Skin is warm and dry. She is not diaphoretic.  Nursing note and vitals reviewed.  Filed Vitals:   03/18/14 0940  BP: 126/64  Pulse: 62  Temp: 98 F (36.7 C)  TempSrc: Oral  Resp: 16  Height: 5\' 3"  (1.6 m)  Weight: 187 lb (84.823 kg)  SpO2: 95%      Assessment & Plan:

## 2014-03-21 ENCOUNTER — Other Ambulatory Visit: Payer: Self-pay | Admitting: Internal Medicine

## 2014-04-18 ENCOUNTER — Other Ambulatory Visit: Payer: Self-pay | Admitting: Internal Medicine

## 2014-04-26 ENCOUNTER — Other Ambulatory Visit: Payer: Self-pay | Admitting: Internal Medicine

## 2014-05-04 ENCOUNTER — Encounter: Payer: Self-pay | Admitting: Internal Medicine

## 2014-05-04 ENCOUNTER — Ambulatory Visit (INDEPENDENT_AMBULATORY_CARE_PROVIDER_SITE_OTHER): Payer: Medicare Other | Admitting: Internal Medicine

## 2014-05-04 VITALS — BP 122/80 | HR 64 | Temp 97.8°F | Resp 14 | Ht 63.0 in | Wt 184.8 lb

## 2014-05-04 DIAGNOSIS — K219 Gastro-esophageal reflux disease without esophagitis: Secondary | ICD-10-CM

## 2014-05-04 DIAGNOSIS — H9193 Unspecified hearing loss, bilateral: Secondary | ICD-10-CM | POA: Diagnosis not present

## 2014-05-04 DIAGNOSIS — R140 Abdominal distension (gaseous): Secondary | ICD-10-CM

## 2014-05-04 NOTE — Progress Notes (Signed)
Pre visit review using our clinic review tool, if applicable. No additional management support is needed unless otherwise documented below in the visit note. 

## 2014-05-04 NOTE — Patient Instructions (Signed)
We have given you some information about the gas and bloating. I would recommend to continue the probiotic as it will help restore the good bacteria in your gut and decrease the amount of gas.   You can also try gas-x or beano over the counter to help with gas as some people get a lot of relief from them.   It may take several weeks for the side effects of that diet to go away. The other thing to look at is if the protein shakes have a lot of the fake sugar (sweet n'low, aspertame) this can add some gas to the colon.   Call us if you have more problems or this does not go back to normal.

## 2014-05-05 DIAGNOSIS — R140 Abdominal distension (gaseous): Secondary | ICD-10-CM | POA: Insufficient documentation

## 2014-05-05 NOTE — Progress Notes (Signed)
   Subjective:    Patient ID: Diana Hendrix, female    DOB: 23-Jul-1945, 69 y.o.   MRN: [id number]  HPI The patient is a 68 YO female who is coming in with new problem of stomach bloating and gas causing discomfort (4/10) going on for 2-3 weeks now. She started a new diet with physician weight loss center and was advised to eat lots of greens and vegetables. Since then she has had this problem. She stopped the diet about 1-2 weeks ago. She is still drinking protein shakes. She has also started taking a probiotic and thinks that is helping some. She now is not having the discomfort in her stomach but still some gas and bloating. She denies constipation and is moving her bowels regularly. No blood in her stools.   Review of Systems  Constitutional: Negative.   Respiratory: Negative.   Cardiovascular: Negative.   Gastrointestinal: Positive for abdominal distention. Negative for nausea, vomiting, abdominal pain, diarrhea and constipation.  Neurological: Negative.       Objective:   Physical Exam  Constitutional: She appears well-developed and well-nourished.  HENT:  Head: Normocephalic and atraumatic.  Eyes: EOM are normal.  Neck: Normal range of motion.  Cardiovascular: Normal rate and regular rhythm.   Pulmonary/Chest: Effort normal and breath sounds normal.  Abdominal: Soft. Bowel sounds are normal. There is no tenderness. There is no rebound.  Hard to tell distention versus adiposity, not much tympany.    Filed Vitals:   05/04/14 1632  BP: 122/80  Pulse: 64  Temp: 97.8 F (36.6 C)  TempSrc: Oral  Resp: 14  Height: 5\' 3"  (1.6 m)  Weight: 184 lb 12.8 oz (83.825 kg)  SpO2: 95%      Assessment & Plan:

## 2014-05-05 NOTE — Assessment & Plan Note (Signed)
No do feel this represents an exacerbation. Continue daily PPI.

## 2014-05-05 NOTE — Assessment & Plan Note (Signed)
Likely from the change in diet. Encouraged her to continue her probiotic. Gave her information about high gas producing foods. Also talked to her about artificial sugars in her protein drink that could be contributing. She will continue with exercise. Keep taking PPI but do not think that is exacerbated.

## 2014-05-09 ENCOUNTER — Other Ambulatory Visit: Payer: Self-pay | Admitting: Internal Medicine

## 2014-05-17 ENCOUNTER — Other Ambulatory Visit: Payer: Self-pay | Admitting: Internal Medicine

## 2014-05-18 DIAGNOSIS — J301 Allergic rhinitis due to pollen: Secondary | ICD-10-CM | POA: Diagnosis not present

## 2014-05-18 DIAGNOSIS — H9193 Unspecified hearing loss, bilateral: Secondary | ICD-10-CM | POA: Diagnosis not present

## 2014-05-18 DIAGNOSIS — J342 Deviated nasal septum: Secondary | ICD-10-CM | POA: Diagnosis not present

## 2014-05-23 ENCOUNTER — Other Ambulatory Visit: Payer: Self-pay | Admitting: Internal Medicine

## 2014-05-25 ENCOUNTER — Encounter: Payer: Self-pay | Admitting: Internal Medicine

## 2014-05-29 DIAGNOSIS — H5711 Ocular pain, right eye: Secondary | ICD-10-CM | POA: Diagnosis not present

## 2014-05-29 DIAGNOSIS — H43811 Vitreous degeneration, right eye: Secondary | ICD-10-CM | POA: Diagnosis not present

## 2014-06-08 ENCOUNTER — Other Ambulatory Visit: Payer: Self-pay | Admitting: Internal Medicine

## 2014-06-20 ENCOUNTER — Other Ambulatory Visit: Payer: Self-pay | Admitting: Internal Medicine

## 2014-06-28 ENCOUNTER — Other Ambulatory Visit: Payer: Self-pay | Admitting: Internal Medicine

## 2014-07-11 ENCOUNTER — Other Ambulatory Visit: Payer: Self-pay | Admitting: Internal Medicine

## 2014-07-18 ENCOUNTER — Other Ambulatory Visit: Payer: Self-pay | Admitting: Internal Medicine

## 2014-08-04 DIAGNOSIS — I10 Essential (primary) hypertension: Secondary | ICD-10-CM | POA: Diagnosis not present

## 2014-08-04 DIAGNOSIS — N189 Chronic kidney disease, unspecified: Secondary | ICD-10-CM | POA: Diagnosis not present

## 2014-08-04 DIAGNOSIS — E039 Hypothyroidism, unspecified: Secondary | ICD-10-CM | POA: Diagnosis not present

## 2014-08-04 DIAGNOSIS — E780 Pure hypercholesterolemia: Secondary | ICD-10-CM | POA: Diagnosis not present

## 2014-08-04 DIAGNOSIS — D649 Anemia, unspecified: Secondary | ICD-10-CM | POA: Diagnosis not present

## 2014-08-08 ENCOUNTER — Other Ambulatory Visit: Payer: Self-pay | Admitting: Internal Medicine

## 2014-08-15 ENCOUNTER — Other Ambulatory Visit: Payer: Self-pay | Admitting: Internal Medicine

## 2014-08-22 ENCOUNTER — Other Ambulatory Visit: Payer: Self-pay | Admitting: Internal Medicine

## 2014-08-24 ENCOUNTER — Other Ambulatory Visit: Payer: Self-pay

## 2014-08-24 MED ORDER — PANTOPRAZOLE SODIUM 40 MG PO TBEC: 40.0000 mg | DELAYED_RELEASE_TABLET | Freq: Every day | ORAL | Status: DC

## 2014-08-24 MED ORDER — AMITRIPTYLINE HCL 10 MG PO TABS: 20.0000 mg | ORAL_TABLET | Freq: Every day | ORAL | Status: DC

## 2014-09-05 ENCOUNTER — Other Ambulatory Visit: Payer: Self-pay | Admitting: Internal Medicine

## 2014-09-07 ENCOUNTER — Ambulatory Visit (INDEPENDENT_AMBULATORY_CARE_PROVIDER_SITE_OTHER): Payer: Medicare Other | Admitting: Pulmonary Disease

## 2014-09-07 ENCOUNTER — Encounter: Payer: Self-pay | Admitting: Pulmonary Disease

## 2014-09-07 VITALS — BP 118/76 | HR 70 | Ht 63.0 in | Wt 187.4 lb

## 2014-09-07 DIAGNOSIS — G4733 Obstructive sleep apnea (adult) (pediatric): Secondary | ICD-10-CM | POA: Diagnosis not present

## 2014-09-07 NOTE — Patient Instructions (Signed)
Follow up in 1 year.

## 2014-09-07 NOTE — Progress Notes (Signed)
Chief Complaint  Patient presents with  . Follow-up    Pt reports good tolerance with CPAP. Denies problems with mask or pressure    CC: [name], [facility]  History of Present Illness: Diana Hendrix is a 69 y.o. female with OSA.  She has been doing well with CPAP.  She has nasal mask.  She gets about 8 hrs sleep.  She gets dryness in the mouth >> drinks water when she wakes up, and then she is okay.    She has noticed some post-nasal drip, throat clearing, and cough with chest congestion.  This does not happen often, and is not causing her too much trouble.  TESTS: PSG 09/17/12 >> RDI 13.7, SpO2 low 90%, PLMI 0, occasional PVC's and PAC's. Auto CPAP 08/08/14 to 09/06/14 >> used on 30 of 30 nights with average 8 hrs and 59 min.  Average AHI is 2 with median CPAP 10 cm H2O and 95 th percentile CPAP 11 cm H20.  PMHx >> HTN, HLD, GERD, Macular degeneration  PSHx, Medications, Allergies, Fhx, Shx reviewed.  Physical Exam: BP 118/76 mmHg  Pulse 70  Ht 5\' 3"  (1.6 m)  Wt 187 lb 6.4 oz (85.004 kg)  BMI 33.20 kg/m2  SpO2 96%  General - No distress ENT - No sinus tenderness, no oral exudate, no LAN, MP 4, scalloped tongue, high arch palate, retrognathic Cardiac - s1s2 regular, no murmur Chest - No wheeze/rales/dullness Back - No focal tenderness Abd - Soft, non-tender Ext - No edema Neuro - Normal strength Skin - No rashes Psych - normal mood, and behavior   Assessment/Plan:  Obstructive sleep apnea. She is compliant with therapy and reports benefit. Plan: - continue auto CPAP  Chest congestion. Discussion: She reports this is intermittent and associated with post-nasal drip.  She does not feel this is much of an issue at present. Plan: - she would like to defer further assessment at this time >> advised her to call if her symptoms progress  Obesity. Plan: - discussed importance of weight loss   Chesley Mires, MD [facility] Pager:  [phone]

## 2014-09-18 ENCOUNTER — Ambulatory Visit (INDEPENDENT_AMBULATORY_CARE_PROVIDER_SITE_OTHER): Payer: Medicare Other | Admitting: Internal Medicine

## 2014-09-18 ENCOUNTER — Encounter: Payer: Self-pay | Admitting: Internal Medicine

## 2014-09-18 VITALS — BP 130/80 | HR 69 | Temp 98.5°F | Resp 12 | Ht 63.0 in | Wt 189.1 lb

## 2014-09-18 DIAGNOSIS — Z780 Asymptomatic menopausal state: Secondary | ICD-10-CM

## 2014-09-18 DIAGNOSIS — M79672 Pain in left foot: Secondary | ICD-10-CM

## 2014-09-18 DIAGNOSIS — M8580 Other specified disorders of bone density and structure, unspecified site: Secondary | ICD-10-CM | POA: Diagnosis not present

## 2014-09-18 DIAGNOSIS — I10 Essential (primary) hypertension: Secondary | ICD-10-CM

## 2014-09-18 DIAGNOSIS — E669 Obesity, unspecified: Secondary | ICD-10-CM

## 2014-09-18 MED ORDER — POLYETHYLENE GLYCOL 3350 17 GM/SCOOP PO POWD: ORAL | Status: DC

## 2014-09-18 MED ORDER — PANTOPRAZOLE SODIUM 40 MG PO TBEC: 40.0000 mg | DELAYED_RELEASE_TABLET | Freq: Every day | ORAL | Status: DC

## 2014-09-18 MED ORDER — AMITRIPTYLINE HCL 10 MG PO TABS: 20.0000 mg | ORAL_TABLET | Freq: Every day | ORAL | Status: DC

## 2014-09-18 MED ORDER — CITALOPRAM HYDROBROMIDE 10 MG PO TABS: 10.0000 mg | ORAL_TABLET | Freq: Every day | ORAL | Status: DC

## 2014-09-18 MED ORDER — SIMVASTATIN 10 MG PO TABS
10.0000 mg | ORAL_TABLET | Freq: Every day | ORAL | Status: DC
Start: 2014-09-18 — End: 2015-09-11

## 2014-09-18 NOTE — Assessment & Plan Note (Signed)
Feels like bone spur. Can ice for the redness and try to use shoes that do not rub. She does not want further evaluation today but offered podiatry referral.

## 2014-09-18 NOTE — Assessment & Plan Note (Signed)
Weight unchanged and diet and exercise the same. Encouraged increased activity and she will work on it. Complicated by hypertension and hyperlipidemia.

## 2014-09-18 NOTE — Patient Instructions (Addendum)
Biotin is a supplement that helps with nail strength as well as hair.   You can try using some ice on the spot on your foot to see if that helps with the swelling. If it starts giving you more troubles we can always have you see a foot doctor.   Come back for the physical next time and we will get the bone density test when you get back from your trip.  Call us if you have problems or questions before the next visit.

## 2014-09-18 NOTE — Progress Notes (Signed)
   Subjective:    Patient ID: Diana Hendrix, female    DOB: 1945/07/19, 69 y.o.   MRN: [id number]  HPI The patient is a 69 YO female who is coming in for follow up on her blood pressure. She is still doing well on her medicine and no changes. Denies headaches, chest pains, SOB. She denies side effects from medicine. Checks BP at home and usually at goal. Has had for many years.  She is also having new foot pain. Has a lump on the side of her foot. Present about 3 months. Rubs against her shoes and has been trying to wear open shoes to help. Taken tylenol for pain which is effective.   Review of Systems  Constitutional: Negative.   Respiratory: Negative.   Cardiovascular: Negative.   Gastrointestinal: Negative for nausea, vomiting, abdominal pain, diarrhea, constipation and abdominal distention.  Musculoskeletal: Positive for arthralgias. Negative for myalgias and gait problem.  Neurological: Negative.       Objective:   Physical Exam  Constitutional: She is oriented to person, place, and time. She appears well-developed and well-nourished. No distress.  Overweight  HENT:  Head: Normocephalic.  Eyes: EOM are normal.  Neck: Normal range of motion. Neck supple. No JVD present. No tracheal deviation present. No thyromegaly present.  Cardiovascular: Normal rate and regular rhythm.   No murmur heard. Pulmonary/Chest: Effort normal and breath sounds normal. No respiratory distress. She has no wheezes. She has no rales.  Abdominal: Soft. Bowel sounds are normal. She exhibits no distension. There is no tenderness. There is no rebound and no guarding.  Musculoskeletal: She exhibits tenderness.  Small lump on the lateral aspect of the left foot, feels bony in origin. Slight redness to the skin covering but no signs of cellulitis or infection. No purulence to the area.  Neurological: She is alert and oriented to person, place, and time.  Skin: Skin is warm and dry. She is not diaphoretic.    Nursing note and vitals reviewed.  Filed Vitals:   09/18/14 1002  BP: 130/80  Pulse: 69  Temp: 98.5 F (36.9 C)  TempSrc: Oral  Resp: 12  Height: 5\' 3"  (1.6 m)  Weight: 189 lb 1.9 oz (85.784 kg)  SpO2: 98%      Assessment & Plan:

## 2014-09-18 NOTE — Assessment & Plan Note (Signed)
BP at goal on metoprolol, amlodipine, olmesartan/hctz. Recent BMP reviewed and no changes indicated today.

## 2014-09-18 NOTE — Progress Notes (Signed)
Pre visit review using our clinic review tool, if applicable. No additional management support is needed unless otherwise documented below in the visit note. 

## 2014-10-27 DIAGNOSIS — Z1231 Encounter for screening mammogram for malignant neoplasm of breast: Secondary | ICD-10-CM | POA: Diagnosis not present

## 2014-11-10 DIAGNOSIS — M722 Plantar fascial fibromatosis: Secondary | ICD-10-CM | POA: Diagnosis not present

## 2014-11-10 DIAGNOSIS — M2011 Hallux valgus (acquired), right foot: Secondary | ICD-10-CM | POA: Diagnosis not present

## 2014-11-10 DIAGNOSIS — M2012 Hallux valgus (acquired), left foot: Secondary | ICD-10-CM | POA: Diagnosis not present

## 2014-11-10 DIAGNOSIS — M7672 Peroneal tendinitis, left leg: Secondary | ICD-10-CM | POA: Diagnosis not present

## 2014-11-28 DIAGNOSIS — Z23 Encounter for immunization: Secondary | ICD-10-CM | POA: Diagnosis not present

## 2014-12-01 DIAGNOSIS — M722 Plantar fascial fibromatosis: Secondary | ICD-10-CM | POA: Diagnosis not present

## 2014-12-01 DIAGNOSIS — M7672 Peroneal tendinitis, left leg: Secondary | ICD-10-CM | POA: Diagnosis not present

## 2015-01-14 ENCOUNTER — Encounter: Payer: Self-pay | Admitting: *Deleted

## 2015-02-02 DIAGNOSIS — Z0100 Encounter for examination of eyes and vision without abnormal findings: Secondary | ICD-10-CM | POA: Diagnosis not present

## 2015-02-02 DIAGNOSIS — Z961 Presence of intraocular lens: Secondary | ICD-10-CM | POA: Diagnosis not present

## 2015-02-08 DIAGNOSIS — N189 Chronic kidney disease, unspecified: Secondary | ICD-10-CM | POA: Diagnosis not present

## 2015-02-08 DIAGNOSIS — R809 Proteinuria, unspecified: Secondary | ICD-10-CM | POA: Diagnosis not present

## 2015-02-08 DIAGNOSIS — E119 Type 2 diabetes mellitus without complications: Secondary | ICD-10-CM | POA: Diagnosis not present

## 2015-02-08 DIAGNOSIS — D649 Anemia, unspecified: Secondary | ICD-10-CM | POA: Diagnosis not present

## 2015-02-08 DIAGNOSIS — I10 Essential (primary) hypertension: Secondary | ICD-10-CM | POA: Diagnosis not present

## 2015-03-11 ENCOUNTER — Ambulatory Visit (INDEPENDENT_AMBULATORY_CARE_PROVIDER_SITE_OTHER)
Admission: RE | Admit: 2015-03-11 | Discharge: 2015-03-11 | Disposition: A | Discharge status: Home / Self Care | Payer: Medicare Other | Source: Ambulatory Visit | Attending: Internal Medicine | Admitting: Internal Medicine

## 2015-03-11 DIAGNOSIS — Z780 Asymptomatic menopausal state: Secondary | ICD-10-CM | POA: Diagnosis not present

## 2015-03-18 ENCOUNTER — Encounter: Payer: Self-pay | Admitting: Internal Medicine

## 2015-03-18 ENCOUNTER — Other Ambulatory Visit (INDEPENDENT_AMBULATORY_CARE_PROVIDER_SITE_OTHER): Payer: Medicare Other

## 2015-03-18 ENCOUNTER — Ambulatory Visit (INDEPENDENT_AMBULATORY_CARE_PROVIDER_SITE_OTHER): Payer: Medicare Other | Admitting: Internal Medicine

## 2015-03-18 VITALS — BP 122/80 | HR 90 | Temp 99.1°F | Resp 12 | Ht 63.0 in | Wt 192.4 lb

## 2015-03-18 DIAGNOSIS — Z0000 Encounter for general adult medical examination without abnormal findings: Secondary | ICD-10-CM | POA: Insufficient documentation

## 2015-03-18 DIAGNOSIS — D649 Anemia, unspecified: Secondary | ICD-10-CM

## 2015-03-18 DIAGNOSIS — E785 Hyperlipidemia, unspecified: Secondary | ICD-10-CM | POA: Diagnosis not present

## 2015-03-18 DIAGNOSIS — Z23 Encounter for immunization: Secondary | ICD-10-CM | POA: Diagnosis not present

## 2015-03-18 LAB — COMPREHENSIVE METABOLIC PANEL
ALK PHOS: 80 U/L (ref 39–117)
ALT: 14 U/L (ref 0–35)
AST: 22 U/L (ref 0–37)
Albumin: 4.4 g/dL (ref 3.5–5.2)
BUN: 17 mg/dL (ref 6–23)
CO2: 30 mEq/L (ref 19–32)
Calcium: 9.7 mg/dL (ref 8.4–10.5)
Chloride: 95 mEq/L — ABNORMAL LOW (ref 96–112)
Creatinine, Ser: 0.97 mg/dL (ref 0.40–1.20)
GFR: 60.58 mL/min (ref >60.00)
GLUCOSE: 109 mg/dL — AB (ref 70–99)
POTASSIUM: 4.2 meq/L (ref 3.5–5.1)
SODIUM: 132 meq/L — AB (ref 135–145)
TOTAL PROTEIN: 7.8 g/dL (ref 6.0–8.3)
Total Bilirubin: 0.4 mg/dL (ref 0.2–1.2)

## 2015-03-18 LAB — LIPID PANEL
Cholesterol: 147 mg/dL (ref 0–200)
HDL: 47.6 mg/dL (ref >39.00)
LDL Cholesterol: 78 mg/dL (ref 0–99)
NonHDL: 99.21
Total CHOL/HDL Ratio: 3
Triglycerides: 104 mg/dL (ref 0.0–149.0)
VLDL: 20.8 mg/dL (ref 0.0–40.0)

## 2015-03-18 LAB — CBC
HEMATOCRIT: 40.8 % (ref 36.0–46.0)
HEMOGLOBIN: 14 g/dL (ref 12.0–15.0)
MCHC: 34.3 g/dL (ref 30.0–36.0)
MCV: 88.3 fl (ref 78.0–100.0)
Platelets: 231 10*3/uL (ref 150.0–400.0)
RBC: 4.62 Mil/uL (ref 3.87–5.11)
RDW: 13.2 % (ref 11.5–15.5)
WBC: 5.7 10*3/uL (ref 4.0–10.5)

## 2015-03-18 MED ORDER — FLUTICASONE PROPIONATE 50 MCG/ACT NA SUSP: 2.0000 | Freq: Every day | NASAL | Status: DC

## 2015-03-18 NOTE — Progress Notes (Signed)
Pre visit review using our clinic review tool, if applicable. No additional management support is needed unless otherwise documented below in the visit note. 

## 2015-03-18 NOTE — Progress Notes (Signed)
   Subjective:    Patient ID: Diana Hendrix, female    DOB: 1945-08-24, 70 y.o.   MRN: [id number]  HPI Here for medicare wellness, no new complaints. Please see A/P for status and treatment of chronic medical problems.   Diet: heart healthy Physical activity: sedentary Depression/mood screen: negative Hearing: intact to whispered voice, mild loss Visual acuity: grossly normal with lens, performs annual eye exam  ADLs: capable Fall risk: none Home safety: good Cognitive evaluation: intact to orientation, naming, recall and repetition EOL planning: adv directives discussed  I have personally reviewed and have noted 1. The patient's medical and social history - reviewed today no changes 2. Their use of alcohol, tobacco or illicit drugs 3. Their current medications and supplements 4. The patient's functional ability including ADL's, fall risks, home safety risks and hearing or visual impairment. 5. Diet and physical activities 6. Evidence for depression or mood disorders 7. Care team reviewed and updated (available in snapshot)  Review of Systems  Constitutional: Negative.   HENT: Negative.   Eyes: Negative.   Respiratory: Negative.   Cardiovascular: Negative.   Gastrointestinal: Negative for nausea, vomiting, abdominal pain, diarrhea, constipation and abdominal distention.  Musculoskeletal: Positive for arthralgias. Negative for myalgias and gait problem.  Skin: Negative.   Neurological: Negative.       Objective:   Physical Exam  Constitutional: She is oriented to person, place, and time. She appears well-developed and well-nourished. No distress.  Overweight  HENT:  Head: Normocephalic.  Eyes: EOM are normal.  Neck: Normal range of motion. Neck supple. No JVD present. No tracheal deviation present. No thyromegaly present.  Cardiovascular: Normal rate and regular rhythm.   No murmur heard. Carotids without bruit bilaterally.   Pulmonary/Chest: Effort normal and  breath sounds normal. No respiratory distress. She has no wheezes. She has no rales.  Abdominal: Soft. Bowel sounds are normal. She exhibits no distension. There is no tenderness. There is no rebound and no guarding.  Musculoskeletal: She exhibits no tenderness.  Neurological: She is alert and oriented to person, place, and time.  Skin: Skin is warm and dry. She is not diaphoretic.  Nursing note and vitals reviewed.  Filed Vitals:   03/18/15 0955  BP: 122/80  Pulse: 90  Temp: 99.1 F (37.3 C)  TempSrc: Oral  Resp: 12  Height: 5\' 3"  (1.6 m)  Weight: 192 lb 6.4 oz (87.272 kg)  SpO2: 96%      Assessment & Plan:  Prevnar 13 and tdap given at visit.

## 2015-03-18 NOTE — Patient Instructions (Signed)
We have sent in flonase for the sinuses to help them to dry up. Use 2 sprays in each nostril once a day for the next 1-2 weeks.   We are checking the labs today and have given you the pneumonia and tetanus shot.   Come back in about 6 months for a check on the blood pressure.   Health Maintenance, Female Adopting a healthy lifestyle and getting preventive care can go a long way to promote health and wellness. Talk with your health care provider about what schedule of regular examinations is right for you. This is a good chance for you to check in with your provider about disease prevention and staying healthy. In between checkups, there are plenty of things you can do on your own. Experts have done a lot of research about which lifestyle changes and preventive measures are most likely to keep you healthy. Ask your health care provider for more information. WEIGHT AND DIET  Eat a healthy diet  Be sure to include plenty of vegetables, fruits, low-fat dairy products, and lean protein.  Do not eat a lot of foods high in solid fats, added sugars, or salt.  Get regular exercise. This is one of the most important things you can do for your health.  Most adults should exercise for at least 150 minutes each week. The exercise should increase your heart rate and make you sweat (moderate-intensity exercise).  Most adults should also do strengthening exercises at least twice a week. This is in addition to the moderate-intensity exercise.  Maintain a healthy weight  Body mass index (BMI) is a measurement that can be used to identify possible weight problems. It estimates body fat based on height and weight. Your health care provider can help determine your BMI and help you achieve or maintain a healthy weight.  For females 12 years of age and older:   A BMI below 18.5 is considered underweight.  A BMI of 18.5 to 24.9 is normal.  A BMI of 25 to 29.9 is considered overweight.  A BMI of 30 and  above is considered obese.  Watch levels of cholesterol and blood lipids  You should start having your blood tested for lipids and cholesterol at 69 years of age, then have this test every 5 years.  You may need to have your cholesterol levels checked more often if:  Your lipid or cholesterol levels are high.  You are older than 69 years of age.  You are at high risk for heart disease.  CANCER SCREENING   Lung Cancer  Lung cancer screening is recommended for adults 47-61 years old who are at high risk for lung cancer because of a history of smoking.  A yearly low-dose CT scan of the lungs is recommended for people who:  Currently smoke.  Have quit within the past 15 years.  Have at least a 30-pack-year history of smoking. A pack year is smoking an average of one pack of cigarettes a day for 1 year.  Yearly screening should continue until it has been 15 years since you quit.  Yearly screening should stop if you develop a health problem that would prevent you from having lung cancer treatment.  Breast Cancer  Practice breast self-awareness. This means understanding how your breasts normally appear and feel.  It also means doing regular breast self-exams. Let your health care provider know about any changes, no matter how small.  If you are in your 20s or 30s, you should have a  clinical breast exam (CBE) by a health care provider every 1-3 years as part of a regular health exam.  If you are 40 or older, have a CBE every year. Also consider having a breast X-ray (mammogram) every year.  If you have a family history of breast cancer, talk to your health care provider about genetic screening.  If you are at high risk for breast cancer, talk to your health care provider about having an MRI and a mammogram every year.  Breast cancer gene (BRCA) assessment is recommended for women who have family members with BRCA-related cancers. BRCA-related cancers  include:  Breast.  Ovarian.  Tubal.  Peritoneal cancers.  Results of the assessment will determine the need for genetic counseling and BRCA1 and BRCA2 testing. Cervical Cancer Your health care provider may recommend that you be screened regularly for cancer of the pelvic organs (ovaries, uterus, and vagina). This screening involves a pelvic examination, including checking for microscopic changes to the surface of your cervix (Pap test). You may be encouraged to have this screening done every 3 years, beginning at age 21.  For women ages 30-65, health care providers may recommend pelvic exams and Pap testing every 3 years, or they may recommend the Pap and pelvic exam, combined with testing for human papilloma virus (HPV), every 5 years. Some types of HPV increase your risk of cervical cancer. Testing for HPV may also be done on women of any age with unclear Pap test results.  Other health care providers may not recommend any screening for nonpregnant women who are considered low risk for pelvic cancer and who do not have symptoms. Ask your health care provider if a screening pelvic exam is right for you.  If you have had past treatment for cervical cancer or a condition that could lead to cancer, you need Pap tests and screening for cancer for at least 20 years after your treatment. If Pap tests have been discontinued, your risk factors (such as having a new sexual partner) need to be reassessed to determine if screening should resume. Some women have medical problems that increase the chance of getting cervical cancer. In these cases, your health care provider may recommend more frequent screening and Pap tests. Colorectal Cancer  This type of cancer can be detected and often prevented.  Routine colorectal cancer screening usually begins at 69 years of age and continues through 69 years of age.  Your health care provider may recommend screening at an earlier age if you have risk factors for  colon cancer.  Your health care provider may also recommend using home test kits to check for hidden blood in the stool.  A small camera at the end of a tube can be used to examine your colon directly (sigmoidoscopy or colonoscopy). This is done to check for the earliest forms of colorectal cancer.  Routine screening usually begins at age 50.  Direct examination of the colon should be repeated every 5-10 years through 69 years of age. However, you may need to be screened more often if early forms of precancerous polyps or small growths are found. Skin Cancer  Check your skin from head to toe regularly.  Tell your health care provider about any new moles or changes in moles, especially if there is a change in a mole's shape or color.  Also tell your health care provider if you have a mole that is larger than the size of a pencil eraser.  Always use sunscreen. Apply sunscreen liberally   and repeatedly throughout the day.  Protect yourself by wearing long sleeves, pants, a wide-brimmed hat, and sunglasses whenever you are outside. HEART DISEASE, DIABETES, AND HIGH BLOOD PRESSURE   High blood pressure causes heart disease and increases the risk of stroke. High blood pressure is more likely to develop in:  People who have blood pressure in the high end of the normal range (130-139/85-89 mm Hg).  People who are overweight or obese.  People who are African American.  If you are 68-18 years of age, have your blood pressure checked every 3-5 years. If you are 96 years of age or older, have your blood pressure checked every year. You should have your blood pressure measured twice--once when you are at a hospital or clinic, and once when you are not at a hospital or clinic. Record the average of the two measurements. To check your blood pressure when you are not at a hospital or clinic, you can use:  An automated blood pressure machine at a pharmacy.  A home blood pressure monitor.  If you  are between 52 years and 37 years old, ask your health care provider if you should take aspirin to prevent strokes.  Have regular diabetes screenings. This involves taking a blood sample to check your fasting blood sugar level.  If you are at a normal weight and have a low risk for diabetes, have this test once every three years after 69 years of age.  If you are overweight and have a high risk for diabetes, consider being tested at a younger age or more often. PREVENTING INFECTION  Hepatitis B  If you have a higher risk for hepatitis B, you should be screened for this virus. You are considered at high risk for hepatitis B if:  You were born in a country where hepatitis B is common. Ask your health care provider which countries are considered high risk.  Your parents were born in a high-risk country, and you have not been immunized against hepatitis B (hepatitis B vaccine).  You have HIV or AIDS.  You use needles to inject street drugs.  You live with someone who has hepatitis B.  You have had sex with someone who has hepatitis B.  You get hemodialysis treatment.  You take certain medicines for conditions, including cancer, organ transplantation, and autoimmune conditions. Hepatitis C  Blood testing is recommended for:  Everyone born from [birth year] through 1964.  Anyone with known risk factors for hepatitis C. Sexually transmitted infections (STIs)  You should be screened for sexually transmitted infections (STIs) including gonorrhea and chlamydia if:  You are sexually active and are younger than 69 years of age.  You are older than 69 years of age and your health care provider tells you that you are at risk for this type of infection.  Your sexual activity has changed since you were last screened and you are at an increased risk for chlamydia or gonorrhea. Ask your health care provider if you are at risk.  If you do not have HIV, but are at risk, it may be recommended that you  take a prescription medicine daily to prevent HIV infection. This is called pre-exposure prophylaxis (PrEP). You are considered at risk if:  You are sexually active and do not regularly use condoms or know the HIV status of your partner(s).  You take drugs by injection.  You are sexually active with a partner who has HIV. Talk with your health care provider about whether you are  at high risk of being infected with HIV. If you choose to begin PrEP, you should first be tested for HIV. You should then be tested every 3 months for as long as you are taking PrEP.  PREGNANCY   If you are premenopausal and you may become pregnant, ask your health care provider about preconception counseling.  If you may become pregnant, take 400 to 800 micrograms (mcg) of folic acid every day.  If you want to prevent pregnancy, talk to your health care provider about birth control (contraception). OSTEOPOROSIS AND MENOPAUSE   Osteoporosis is a disease in which the bones lose minerals and strength with aging. This can result in serious bone fractures. Your risk for osteoporosis can be identified using a bone density scan.  If you are 56 years of age or older, or if you are at risk for osteoporosis and fractures, ask your health care provider if you should be screened.  Ask your health care provider whether you should take a calcium or vitamin D supplement to lower your risk for osteoporosis.  Menopause may have certain physical symptoms and risks.  Hormone replacement therapy may reduce some of these symptoms and risks. Talk to your health care provider about whether hormone replacement therapy is right for you.  HOME CARE INSTRUCTIONS   Schedule regular health, dental, and eye exams.  Stay current with your immunizations.   Do not use any tobacco products including cigarettes, chewing tobacco, or electronic cigarettes.  If you are pregnant, do not drink alcohol.  If you are breastfeeding, limit how  much and how often you drink alcohol.  Limit alcohol intake to no more than 1 drink per day for nonpregnant women. One drink equals 12 ounces of beer, 5 ounces of wine, or 1 ounces of hard liquor.  Do not use street drugs.  Do not share needles.  Ask your health care provider for help if you need support or information about quitting drugs.  Tell your health care provider if you often feel depressed.  Tell your health care provider if you have ever been abused or do not feel safe at home.   This information is not intended to replace advice given to you by your health care provider. Make sure you discuss any questions you have with your health care provider.   Document Released: 07/18/2010 Document Revised: 01/23/2014 Document Reviewed: 12/04/2012 Elsevier Interactive Patient Education [employer].

## 2015-03-18 NOTE — Addendum Note (Signed)
Addended by: Resa Miner R on: 03/18/2015 03:29 PM   Modules accepted: Orders

## 2015-03-18 NOTE — Assessment & Plan Note (Signed)
Tdap and prevnar 13 given to update immunizations. Colonoscopy and mammogram up to date. Counseled her on sun safety and mole surveillance. Given 10 year screening recommendations.

## 2015-08-03 DIAGNOSIS — E785 Hyperlipidemia, unspecified: Secondary | ICD-10-CM | POA: Diagnosis not present

## 2015-08-03 DIAGNOSIS — E119 Type 2 diabetes mellitus without complications: Secondary | ICD-10-CM | POA: Diagnosis not present

## 2015-08-03 DIAGNOSIS — R809 Proteinuria, unspecified: Secondary | ICD-10-CM | POA: Diagnosis not present

## 2015-08-03 DIAGNOSIS — I10 Essential (primary) hypertension: Secondary | ICD-10-CM | POA: Diagnosis not present

## 2015-08-03 DIAGNOSIS — E7800 Pure hypercholesterolemia, unspecified: Secondary | ICD-10-CM | POA: Diagnosis not present

## 2015-08-03 DIAGNOSIS — N189 Chronic kidney disease, unspecified: Secondary | ICD-10-CM | POA: Diagnosis not present

## 2015-08-03 DIAGNOSIS — D649 Anemia, unspecified: Secondary | ICD-10-CM | POA: Diagnosis not present

## 2015-08-03 DIAGNOSIS — E039 Hypothyroidism, unspecified: Secondary | ICD-10-CM | POA: Diagnosis not present

## 2015-09-02 ENCOUNTER — Other Ambulatory Visit: Payer: Self-pay | Admitting: Internal Medicine

## 2015-09-02 ENCOUNTER — Ambulatory Visit (INDEPENDENT_AMBULATORY_CARE_PROVIDER_SITE_OTHER): Payer: Medicare Other | Admitting: Internal Medicine

## 2015-09-02 ENCOUNTER — Encounter: Payer: Self-pay | Admitting: Internal Medicine

## 2015-09-02 DIAGNOSIS — R0781 Pleurodynia: Secondary | ICD-10-CM | POA: Diagnosis not present

## 2015-09-02 MED ORDER — MELOXICAM 7.5 MG PO TABS
7.5000 mg | ORAL_TABLET | Freq: Every day | ORAL | 0 refills | Status: DC
Start: 2015-09-02 — End: 2015-09-02

## 2015-09-02 NOTE — Progress Notes (Signed)
Pre visit review using our clinic review tool, if applicable. No additional management support is needed unless otherwise documented below in the visit note. 

## 2015-09-02 NOTE — Assessment & Plan Note (Signed)
Rx for meloxicam for 1-2 weeks for pain. No evidence of fracture on exam and gradual improvement is reassuring. Counseled on home safety and making sure she does not fall again in the future.

## 2015-09-02 NOTE — Progress Notes (Signed)
   Subjective:    Patient ID: Diana Hendrix, female    DOB: [birth year], 70 y.o.   MRN: [id number]  HPI The patient is a 70 YO female coming in for follow up of fall. She was at her child's house and fell while in the tub. She hit the right rib cage and into her back. She denies hitting her head or LOC. She did not seek care at the time. She has been taking tylenol for the pain. She was scared to take ibuprofen for the pain. She has gradually improved but still having some soreness. She is able to take a deep breath without pain. No popping or snapping sound when she fell.   Review of Systems  Constitutional: Negative.   Respiratory: Negative.   Cardiovascular: Negative.   Gastrointestinal: Negative for abdominal distention, abdominal pain, constipation, diarrhea, nausea and vomiting.  Musculoskeletal: Positive for arthralgias and myalgias. Negative for gait problem.  Skin: Negative.   Neurological: Negative.       Objective:   Physical Exam  Constitutional: She is oriented to person, place, and time. She appears well-developed and well-nourished. No distress.  Overweight  HENT:  Head: Normocephalic.  Eyes: EOM are normal.  Neck: Normal range of motion. Neck supple. No JVD present. No tracheal deviation present. No thyromegaly present.  Cardiovascular: Normal rate and regular rhythm.   No murmur heard. Pulmonary/Chest: Effort normal and breath sounds normal. No respiratory distress. She has no wheezes. She has no rales. She exhibits tenderness.  Rib pain from central sternum to right paraspinal without evidence of fracture of the rib cage  Abdominal: Soft. Bowel sounds are normal. She exhibits no distension. There is no tenderness. There is no rebound and no guarding.  Musculoskeletal: She exhibits no tenderness.  Neurological: She is alert and oriented to person, place, and time.  Skin: Skin is warm and dry. She is not diaphoretic.  Nursing note and vitals reviewed.  Vitals:   09/02/15 0834  BP: 122/70  Pulse: 70  Resp: 16  Temp: 98.5 F (36.9 C)  TempSrc: Oral  SpO2: 98%  Weight: 198 lb 6.4 oz (90 kg)  Height: 5\' 3"  (1.6 m)      Assessment & Plan:

## 2015-09-02 NOTE — Patient Instructions (Signed)
We have sent in meloxicam (mobic) which is an anti-inflammatory medicine. Take 1 pill with breakfast for the next week. Then use as needed. It is okay to take the tylenol as well while taking the mobic.

## 2015-09-07 ENCOUNTER — Encounter: Payer: Self-pay | Admitting: Pulmonary Disease

## 2015-09-07 ENCOUNTER — Ambulatory Visit (INDEPENDENT_AMBULATORY_CARE_PROVIDER_SITE_OTHER): Payer: Medicare Other | Admitting: Pulmonary Disease

## 2015-09-07 VITALS — BP 102/60 | HR 62 | Ht 63.0 in | Wt 199.2 lb

## 2015-09-07 DIAGNOSIS — J310 Chronic rhinitis: Secondary | ICD-10-CM | POA: Diagnosis not present

## 2015-09-07 DIAGNOSIS — R05 Cough: Secondary | ICD-10-CM | POA: Diagnosis not present

## 2015-09-07 DIAGNOSIS — R058 Other specified cough: Secondary | ICD-10-CM

## 2015-09-07 DIAGNOSIS — G4733 Obstructive sleep apnea (adult) (pediatric): Secondary | ICD-10-CM

## 2015-09-07 NOTE — Progress Notes (Signed)
Current Outpatient Prescriptions on File Prior to Visit  Medication Sig  . amitriptyline (ELAVIL) 10 MG tablet Take 2 tablets (20 mg total) by mouth at bedtime.  Marland Kitchen amLODipine (NORVASC) 5 MG tablet Take 5 mg by mouth daily.  . calcium carbonate (OS-CAL) 600 MG TABS tablet Take 600 mg by mouth 2 (two) times daily with a meal.  . Cholecalciferol (VITAMIN D-3) 1000 UNITS CAPS Take 1 capsule by mouth daily.   . citalopram (CELEXA) 10 MG tablet Take 1 tablet (10 mg total) by mouth daily.  . meloxicam (MOBIC) 7.5 MG tablet TAKE 1 TABLET(7.5 MG) BY MOUTH DAILY  . metoprolol succinate (TOPROL-XL) 50 MG 24 hr tablet Take 50 mg by mouth daily. Take with or immediately following a meal.  . olmesartan-hydrochlorothiazide (BENICAR HCT) 40-12.5 MG per tablet Take 1 tablet by mouth daily.  . pantoprazole (PROTONIX) 40 MG tablet Take 1 tablet (40 mg total) by mouth daily.  . polyethylene glycol powder (GLYCOLAX/MIRALAX) powder MIX 1 CAPFUL IN 8 OUNCES OF WATER AND DRINK ONCE A DAY  . simvastatin (ZOCOR) 10 MG tablet Take 1 tablet (10 mg total) by mouth at bedtime.   No current facility-administered medications on file prior to visit.     Chief Complaint  Patient presents with  . Follow-up    Wears CPAP nightly. Denies problems with mask/pressure. DME: [facility]    Sleep tests PSG 09/17/12 >> RDI 13.7, SpO2 low 90%, PLMI 0, occasional PVC's and PAC's. Auto CPAP 08/07/15 to 09/05/15 >> used on 30 of 30 nights with average 8 hrs 56 min.  Average AHI 1.5 with median CPAP 9 and 95 th percentile CPAP 11 cm H2O  Past medical history HTN, HLD, GERD, Macular degeneration  Past surgical history, Family history, Social history, Allergies reviewed.  Vital signs BP 102/60 (BP Location: Left Arm, Cuff Size: Normal)   Pulse 62   Ht 5\' 3"  (1.6 m)   Wt 199 lb 3.2 oz (90.4 kg)   SpO2 96%   BMI 35.29 kg/m   CC: Diana Hendrix, [facility]  History of Present Illness: Diana Hendrix is a 70 y.o. female  with OSA.  She is still having nasal congestion and post nasal drip.  This is worse in the morning.  She gets a raspy voice because of this.  She has not been using flonase recently.  She has nasal mask.  No issues with mask fit.  She is sleeping well and feels CPAP helps.  Physical Exam:  General - No distress ENT - No sinus tenderness, no oral exudate, no LAN, MP 4, scalloped tongue, high arch palate, retrognathic Cardiac - s1s2 regular, no murmur Chest - No wheeze/rales/dullness Back - No focal tenderness Abd - Soft, non-tender Ext - No edema Neuro - Normal strength Skin - No rashes Psych - normal mood, and behavior   Assessment/Plan:  Obstructive sleep apnea. - She is compliant with therapy and reports benefit. 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 - she can try nasal irrigation - if symptoms persistent, then can try using flonase  Obesity. - discussed importance of weight loss   Patient Instructions  Try using saline nasal spray  If sinus congestion continues, then try using flonase one spray in each nostril daily as needed  Follow up in 1 year    Chesley Mires, MD [facility] Pager:  [phone] 09/07/2015, 10:37 AM

## 2015-09-07 NOTE — Patient Instructions (Signed)
Try using saline nasal spray  If sinus congestion continues, then try using flonase one spray in each nostril daily as needed  Follow up in 1 year

## 2015-09-10 DIAGNOSIS — H04123 Dry eye syndrome of bilateral lacrimal glands: Secondary | ICD-10-CM | POA: Diagnosis not present

## 2015-09-11 ENCOUNTER — Other Ambulatory Visit: Payer: Self-pay | Admitting: Internal Medicine

## 2015-09-21 ENCOUNTER — Encounter: Payer: Self-pay | Admitting: Internal Medicine

## 2015-09-21 ENCOUNTER — Ambulatory Visit (INDEPENDENT_AMBULATORY_CARE_PROVIDER_SITE_OTHER): Payer: Medicare Other | Admitting: Internal Medicine

## 2015-09-21 VITALS — BP 112/60 | HR 67 | Temp 98.1°F | Resp 16 | Ht 63.0 in | Wt 197.0 lb

## 2015-09-21 DIAGNOSIS — Z23 Encounter for immunization: Secondary | ICD-10-CM | POA: Diagnosis not present

## 2015-09-21 DIAGNOSIS — L989 Disorder of the skin and subcutaneous tissue, unspecified: Secondary | ICD-10-CM

## 2015-09-21 DIAGNOSIS — R21 Rash and other nonspecific skin eruption: Secondary | ICD-10-CM | POA: Insufficient documentation

## 2015-09-21 MED ORDER — HYDROCORTISONE 0.5 % EX CREA: 1.0000 "application " | TOPICAL_CREAM | Freq: Two times a day (BID) | CUTANEOUS | 0 refills | Status: DC

## 2015-09-21 NOTE — Progress Notes (Signed)
Pre visit review using our clinic review tool, if applicable. No additional management support is needed unless otherwise documented below in the visit note. 

## 2015-09-21 NOTE — Progress Notes (Signed)
   Subjective:    Patient ID: Diana Hendrix, female    DOB: 1945/09/22, 70 y.o.   MRN: [id number]  HPI The patient is a 70 YO female coming in for several skin concerns. She has a spot on her left thigh which started off as a skin tag and now is growing and changing colors in the last several years. Is now about the size of a pencil eraser. Not painful or sore. She has not seen a dermatologist in years. Additionally she has a red spot with dryness under her right eye and on the left chin. She had been using an old rx of triamcinolone cream on her face. When she saw her eye doctor recently she asked him about it and he advised she stop using this cream. She is now using moisturizer only and feels the spot is returning. Some itching mild but no pain. Not affecting her vision.   Review of Systems  Constitutional: Negative.   Respiratory: Negative.   Cardiovascular: Negative.   Gastrointestinal: Negative for abdominal distention, abdominal pain, constipation, diarrhea, nausea and vomiting.  Musculoskeletal: Positive for arthralgias. Negative for gait problem and myalgias.  Skin: Positive for color change and rash. Negative for pallor and wound.      Objective:   Physical Exam  Constitutional: She is oriented to person, place, and time. She appears well-developed and well-nourished. No distress.  Overweight  HENT:  Head: Normocephalic.  Eyes: EOM are normal.  Some puffiness under the right eye, no clear rash or skin discoloration.   Neck: Normal range of motion. Neck supple. No JVD present. No tracheal deviation present. No thyromegaly present.  Cardiovascular: Normal rate and regular rhythm.   No murmur heard. Pulmonary/Chest: Effort normal and breath sounds normal. No respiratory distress. She has no wheezes. She has no rales.  Abdominal: Soft. Bowel sounds are normal. She exhibits no distension. There is no tenderness. There is no rebound and no guarding.  Musculoskeletal: She exhibits  no tenderness.  Neurological: She is alert and oriented to person, place, and time.  Skin: Skin is warm and dry. She is not diaphoretic.  Skin lesion brown on the left thigh, no surrounding cellulitis.    Vitals:   09/21/15 0931  BP: 112/60  Pulse: 67  Resp: 16  Temp: 98.1 F (36.7 C)  TempSrc: Oral  SpO2: 98%  Weight: 197 lb (89.4 kg)  Height: 5\' 3"  (1.6 m)      Assessment & Plan:  Flu shot given at visit.

## 2015-09-21 NOTE — Patient Instructions (Signed)
We will get you in with the dermatologist to get those spots looked at.   We have also sent in a lotion to use on the face on the chin and under the eyes to help.

## 2015-09-21 NOTE — Assessment & Plan Note (Signed)
2 separate lesions, one on the left thigh which needs removal. Refer to dermatology for this. Additionally the spot of dry skin under the right eye which rx for low potency hydrocortisone. Advised to use moisturizer TID to help with the dry skin and avoid itching.

## 2015-09-25 ENCOUNTER — Other Ambulatory Visit: Payer: Self-pay | Admitting: Internal Medicine

## 2015-10-09 ENCOUNTER — Other Ambulatory Visit: Payer: Self-pay | Admitting: Internal Medicine

## 2015-10-22 DIAGNOSIS — L820 Inflamed seborrheic keratosis: Secondary | ICD-10-CM | POA: Diagnosis not present

## 2015-10-22 DIAGNOSIS — D225 Melanocytic nevi of trunk: Secondary | ICD-10-CM | POA: Diagnosis not present

## 2015-10-22 DIAGNOSIS — L309 Dermatitis, unspecified: Secondary | ICD-10-CM | POA: Diagnosis not present

## 2015-11-02 DIAGNOSIS — Z1231 Encounter for screening mammogram for malignant neoplasm of breast: Secondary | ICD-10-CM | POA: Diagnosis not present

## 2015-11-19 DIAGNOSIS — L821 Other seborrheic keratosis: Secondary | ICD-10-CM | POA: Diagnosis not present

## 2015-11-19 DIAGNOSIS — L814 Other melanin hyperpigmentation: Secondary | ICD-10-CM | POA: Diagnosis not present

## 2015-11-19 DIAGNOSIS — L820 Inflamed seborrheic keratosis: Secondary | ICD-10-CM | POA: Diagnosis not present

## 2015-11-19 DIAGNOSIS — L309 Dermatitis, unspecified: Secondary | ICD-10-CM | POA: Diagnosis not present

## 2015-11-19 DIAGNOSIS — D225 Melanocytic nevi of trunk: Secondary | ICD-10-CM | POA: Diagnosis not present

## 2015-12-11 ENCOUNTER — Other Ambulatory Visit: Payer: Self-pay | Admitting: Internal Medicine

## 2015-12-13 DIAGNOSIS — L309 Dermatitis, unspecified: Secondary | ICD-10-CM | POA: Diagnosis not present

## 2015-12-15 DIAGNOSIS — L309 Dermatitis, unspecified: Secondary | ICD-10-CM | POA: Diagnosis not present

## 2015-12-17 DIAGNOSIS — L239 Allergic contact dermatitis, unspecified cause: Secondary | ICD-10-CM | POA: Diagnosis not present

## 2015-12-25 ENCOUNTER — Other Ambulatory Visit: Payer: Self-pay | Admitting: Internal Medicine

## 2016-01-01 ENCOUNTER — Other Ambulatory Visit: Payer: Self-pay | Admitting: Internal Medicine

## 2016-02-01 DIAGNOSIS — I1310 Hypertensive heart and chronic kidney disease without heart failure, with stage 1 through stage 4 chronic kidney disease, or unspecified chronic kidney disease: Secondary | ICD-10-CM | POA: Diagnosis not present

## 2016-02-01 DIAGNOSIS — D649 Anemia, unspecified: Secondary | ICD-10-CM | POA: Diagnosis not present

## 2016-02-01 DIAGNOSIS — E785 Hyperlipidemia, unspecified: Secondary | ICD-10-CM | POA: Diagnosis not present

## 2016-02-01 DIAGNOSIS — I10 Essential (primary) hypertension: Secondary | ICD-10-CM | POA: Diagnosis not present

## 2016-02-01 DIAGNOSIS — E119 Type 2 diabetes mellitus without complications: Secondary | ICD-10-CM | POA: Diagnosis not present

## 2016-02-01 DIAGNOSIS — E039 Hypothyroidism, unspecified: Secondary | ICD-10-CM | POA: Diagnosis not present

## 2016-02-01 DIAGNOSIS — I43 Cardiomyopathy in diseases classified elsewhere: Secondary | ICD-10-CM | POA: Diagnosis not present

## 2016-02-01 DIAGNOSIS — N189 Chronic kidney disease, unspecified: Secondary | ICD-10-CM | POA: Diagnosis not present

## 2016-02-01 DIAGNOSIS — E7800 Pure hypercholesterolemia, unspecified: Secondary | ICD-10-CM | POA: Diagnosis not present

## 2016-03-06 DIAGNOSIS — H43813 Vitreous degeneration, bilateral: Secondary | ICD-10-CM | POA: Diagnosis not present

## 2016-03-06 DIAGNOSIS — H5203 Hypermetropia, bilateral: Secondary | ICD-10-CM | POA: Diagnosis not present

## 2016-03-11 ENCOUNTER — Other Ambulatory Visit: Payer: Self-pay | Admitting: Internal Medicine

## 2016-03-20 ENCOUNTER — Ambulatory Visit (INDEPENDENT_AMBULATORY_CARE_PROVIDER_SITE_OTHER): Payer: Medicare Other | Admitting: Internal Medicine

## 2016-03-20 ENCOUNTER — Other Ambulatory Visit (INDEPENDENT_AMBULATORY_CARE_PROVIDER_SITE_OTHER): Payer: Medicare Other

## 2016-03-20 ENCOUNTER — Encounter: Payer: Self-pay | Admitting: Internal Medicine

## 2016-03-20 VITALS — BP 142/80 | HR 65 | Temp 97.9°F | Ht 63.0 in | Wt 200.0 lb

## 2016-03-20 DIAGNOSIS — E785 Hyperlipidemia, unspecified: Secondary | ICD-10-CM

## 2016-03-20 DIAGNOSIS — Z6835 Body mass index (BMI) 35.0-35.9, adult: Secondary | ICD-10-CM | POA: Diagnosis not present

## 2016-03-20 DIAGNOSIS — Z0000 Encounter for general adult medical examination without abnormal findings: Secondary | ICD-10-CM

## 2016-03-20 DIAGNOSIS — I10 Essential (primary) hypertension: Secondary | ICD-10-CM | POA: Diagnosis not present

## 2016-03-20 DIAGNOSIS — R7301 Impaired fasting glucose: Secondary | ICD-10-CM

## 2016-03-20 DIAGNOSIS — E6609 Other obesity due to excess calories: Secondary | ICD-10-CM

## 2016-03-20 DIAGNOSIS — IMO0001 Reserved for inherently not codable concepts without codable children: Secondary | ICD-10-CM

## 2016-03-20 LAB — LIPID PANEL
CHOLESTEROL: 177 mg/dL (ref 0–200)
HDL: 50.5 mg/dL (ref >39.00)
LDL Cholesterol: 103 mg/dL — ABNORMAL HIGH (ref 0–99)
NonHDL: 126.87
TRIGLYCERIDES: 119 mg/dL (ref 0.0–149.0)
Total CHOL/HDL Ratio: 4
VLDL: 23.8 mg/dL (ref 0.0–40.0)

## 2016-03-20 LAB — COMPREHENSIVE METABOLIC PANEL
ALBUMIN: 4.3 g/dL (ref 3.5–5.2)
ALK PHOS: 86 U/L (ref 39–117)
ALT: 14 U/L (ref 0–35)
AST: 17 U/L (ref 0–37)
BUN: 13 mg/dL (ref 6–23)
CALCIUM: 9.7 mg/dL (ref 8.4–10.5)
CO2: 28 mEq/L (ref 19–32)
CREATININE: 0.94 mg/dL (ref 0.40–1.20)
Chloride: 97 mEq/L (ref 96–112)
GFR: 62.63 mL/min (ref >60.00)
Glucose, Bld: 101 mg/dL — ABNORMAL HIGH (ref 70–99)
Potassium: 4.2 mEq/L (ref 3.5–5.1)
SODIUM: 133 meq/L — AB (ref 135–145)
TOTAL PROTEIN: 7.5 g/dL (ref 6.0–8.3)
Total Bilirubin: 0.6 mg/dL (ref 0.2–1.2)

## 2016-03-20 LAB — CBC
HCT: 39.6 % (ref 36.0–46.0)
Hemoglobin: 13.6 g/dL (ref 12.0–15.0)
MCHC: 34.4 g/dL (ref 30.0–36.0)
MCV: 88.7 fl (ref 78.0–100.0)
Platelets: 272 10*3/uL (ref 150.0–400.0)
RBC: 4.47 Mil/uL (ref 3.87–5.11)
RDW: 13.2 % (ref 11.5–15.5)
WBC: 5.1 10*3/uL (ref 4.0–10.5)

## 2016-03-20 LAB — HEMOGLOBIN A1C: Hgb A1c MFr Bld: 6.1 % (ref 4.6–6.5)

## 2016-03-20 NOTE — Assessment & Plan Note (Signed)
Checking lipid panel for goal LDL <130 (ideal <100). Taking simvastatin 10 mg daily without side effects. Adjust as needed.

## 2016-03-20 NOTE — Assessment & Plan Note (Signed)
EKG unchanged from prior. BP mildly elevated today from prior. Has not taken meds this morning. Usually at goal. Metoprolol 50 mg daily, olmesartan/hctz, amlodipine. Checking CMP and adjust as needed.

## 2016-03-20 NOTE — Progress Notes (Signed)
Pre visit review using our clinic review tool, if applicable. No additional management support is needed unless otherwise documented below in the visit note. 

## 2016-03-20 NOTE — Assessment & Plan Note (Addendum)
Weight stable, she is not exercising and have advised her that she needs to do this. She is not drinking sodas anymore and doing less bread which is good. Not making a concerted effort at weight loss at this time due to no results previously and she has lost motivation. Complicated by HTN, GERD, OSA, hyperlipidemia.

## 2016-03-20 NOTE — Assessment & Plan Note (Signed)
Reminded about the need for mammogram, flu and tetanus and pneumonia up to date. Colonoscopy up to date. Aged out of pap screening. Counseled on sun safety and mole surveillance. Counseled on the need for regular exercise to help her health and wellness. Given 10 year screening recommendations.

## 2016-03-20 NOTE — Patient Instructions (Addendum)
We have done the EKG which is not changed from before. We are checking the labs today.   Work on getting back into exercise at least 3-4 days per week for 30 minutes each time to keep your heart and lungs healthy.   Ask Dr. Halford Chessman about the humidifier for the CPAP to help with dryness.   Health Maintenance, Female Adopting a healthy lifestyle and getting preventive care can go a long way to promote health and wellness. Talk with your health care provider about what schedule of regular examinations is right for you. This is a good chance for you to check in with your provider about disease prevention and staying healthy. In between checkups, there are plenty of things you can do on your own. Experts have done a lot of research about which lifestyle changes and preventive measures are most likely to keep you healthy. Ask your health care provider for more information. Weight and diet Eat a healthy diet  Be sure to include plenty of vegetables, fruits, low-fat dairy products, and lean protein.  Do not eat a lot of foods high in solid fats, added sugars, or salt.  Get regular exercise. This is one of the most important things you can do for your health.  Most adults should exercise for at least 150 minutes each week. The exercise should increase your heart rate and make you sweat (moderate-intensity exercise).  Most adults should also do strengthening exercises at least twice a week. This is in addition to the moderate-intensity exercise. Maintain a healthy weight  Body mass index (BMI) is a measurement that can be used to identify possible weight problems. It estimates body fat based on height and weight. Your health care provider can help determine your BMI and help you achieve or maintain a healthy weight.  For females 49 years of age and older:  A BMI below 18.5 is considered underweight.  A BMI of 18.5 to 24.9 is normal.  A BMI of 25 to 29.9 is considered overweight.  A BMI of 30 and  above is considered obese. Watch levels of cholesterol and blood lipids  You should start having your blood tested for lipids and cholesterol at 70 years of age, then have this test every 5 years.  You may need to have your cholesterol levels checked more often if:  Your lipid or cholesterol levels are high.  You are older than 70 years of age.  You are at high risk for heart disease. Cancer screening Lung Cancer  Lung cancer screening is recommended for adults 38-46 years old who are at high risk for lung cancer because of a history of smoking.  A yearly low-dose CT scan of the lungs is recommended for people who:  Currently smoke.  Have quit within the past 15 years.  Have at least a 30-pack-year history of smoking. A pack year is smoking an average of one pack of cigarettes a day for 1 year.  Yearly screening should continue until it has been 15 years since you quit.  Yearly screening should stop if you develop a health problem that would prevent you from having lung cancer treatment. Breast Cancer  Practice breast self-awareness. This means understanding how your breasts normally appear and feel.  It also means doing regular breast self-exams. Let your health care provider know about any changes, no matter how small.  If you are in your 20s or 30s, you should have a clinical breast exam (CBE) by a health care provider every  1-3 years as part of a regular health exam.  If you are 74 or older, have a CBE every year. Also consider having a breast X-ray (mammogram) every year.  If you have a family history of breast cancer, talk to your health care provider about genetic screening.  If you are at high risk for breast cancer, talk to your health care provider about having an MRI and a mammogram every year.  Breast cancer gene (BRCA) assessment is recommended for women who have family members with BRCA-related cancers. BRCA-related cancers  include:  Breast.  Ovarian.  Tubal.  Peritoneal cancers.  Results of the assessment will determine the need for genetic counseling and BRCA1 and BRCA2 testing. Cervical Cancer  Your health care provider may recommend that you be screened regularly for cancer of the pelvic organs (ovaries, uterus, and vagina). This screening involves a pelvic examination, including checking for microscopic changes to the surface of your cervix (Pap test). You may be encouraged to have this screening done every 3 years, beginning at age 27.  For women ages 10-65, health care providers may recommend pelvic exams and Pap testing every 3 years, or they may recommend the Pap and pelvic exam, combined with testing for human papilloma virus (HPV), every 5 years. Some types of HPV increase your risk of cervical cancer. Testing for HPV may also be done on women of any age with unclear Pap test results.  Other health care providers may not recommend any screening for nonpregnant women who are considered low risk for pelvic cancer and who do not have symptoms. Ask your health care provider if a screening pelvic exam is right for you.  If you have had past treatment for cervical cancer or a condition that could lead to cancer, you need Pap tests and screening for cancer for at least 20 years after your treatment. If Pap tests have been discontinued, your risk factors (such as having a new sexual partner) need to be reassessed to determine if screening should resume. Some women have medical problems that increase the chance of getting cervical cancer. In these cases, your health care provider may recommend more frequent screening and Pap tests. Colorectal Cancer  This type of cancer can be detected and often prevented.  Routine colorectal cancer screening usually begins at 70 years of age and continues through 70 years of age.  Your health care provider may recommend screening at an earlier age if you have risk factors  for colon cancer.  Your health care provider may also recommend using home test kits to check for hidden blood in the stool.  A small camera at the end of a tube can be used to examine your colon directly (sigmoidoscopy or colonoscopy). This is done to check for the earliest forms of colorectal cancer.  Routine screening usually begins at age 83.  Direct examination of the colon should be repeated every 5-10 years through 70 years of age. However, you may need to be screened more often if early forms of precancerous polyps or small growths are found. Skin Cancer  Check your skin from head to toe regularly.  Tell your health care provider about any new moles or changes in moles, especially if there is a change in a mole's shape or color.  Also tell your health care provider if you have a mole that is larger than the size of a pencil eraser.  Always use sunscreen. Apply sunscreen liberally and repeatedly throughout the day.  Protect yourself by  wearing long sleeves, pants, a wide-brimmed hat, and sunglasses whenever you are outside. Heart disease, diabetes, and high blood pressure  High blood pressure causes heart disease and increases the risk of stroke. High blood pressure is more likely to develop in:  People who have blood pressure in the high end of the normal range (130-139/85-89 mm Hg).  People who are overweight or obese.  People who are African American.  If you are 59-33 years of age, have your blood pressure checked every 3-5 years. If you are 11 years of age or older, have your blood pressure checked every year. You should have your blood pressure measured twice-once when you are at a hospital or clinic, and once when you are not at a hospital or clinic. Record the average of the two measurements. To check your blood pressure when you are not at a hospital or clinic, you can use:  An automated blood pressure machine at a pharmacy.  A home blood pressure monitor.  If you  are between 85 years and 70 years old, ask your health care provider if you should take aspirin to prevent strokes.  Have regular diabetes screenings. This involves taking a blood sample to check your fasting blood sugar level.  If you are at a normal weight and have a low risk for diabetes, have this test once every three years after 70 years of age.  If you are overweight and have a high risk for diabetes, consider being tested at a younger age or more often. Preventing infection Hepatitis B  If you have a higher risk for hepatitis B, you should be screened for this virus. You are considered at high risk for hepatitis B if:  You were born in a country where hepatitis B is common. Ask your health care provider which countries are considered high risk.  Your parents were born in a high-risk country, and you have not been immunized against hepatitis B (hepatitis B vaccine).  You have HIV or AIDS.  You use needles to inject street drugs.  You live with someone who has hepatitis B.  You have had sex with someone who has hepatitis B.  You get hemodialysis treatment.  You take certain medicines for conditions, including cancer, organ transplantation, and autoimmune conditions. Hepatitis C  Blood testing is recommended for:  Everyone born from [birth year] through 1964.  Anyone with known risk factors for hepatitis C. Sexually transmitted infections (STIs)  You should be screened for sexually transmitted infections (STIs) including gonorrhea and chlamydia if:  You are sexually active and are younger than 70 years of age.  You are older than 70 years of age and your health care provider tells you that you are at risk for this type of infection.  Your sexual activity has changed since you were last screened and you are at an increased risk for chlamydia or gonorrhea. Ask your health care provider if you are at risk.  If you do not have HIV, but are at risk, it may be recommended that you  take a prescription medicine daily to prevent HIV infection. This is called pre-exposure prophylaxis (PrEP). You are considered at risk if:  You are sexually active and do not regularly use condoms or know the HIV status of your partner(s).  You take drugs by injection.  You are sexually active with a partner who has HIV. Talk with your health care provider about whether you are at high risk of being infected with HIV. If you choose  to begin PrEP, you should first be tested for HIV. You should then be tested every 3 months for as long as you are taking PrEP. Pregnancy  If you are premenopausal and you may become pregnant, ask your health care provider about preconception counseling.  If you may become pregnant, take 400 to 800 micrograms (mcg) of folic acid every day.  If you want to prevent pregnancy, talk to your health care provider about birth control (contraception). Osteoporosis and menopause  Osteoporosis is a disease in which the bones lose minerals and strength with aging. This can result in serious bone fractures. Your risk for osteoporosis can be identified using a bone density scan.  If you are 41 years of age or older, or if you are at risk for osteoporosis and fractures, ask your health care provider if you should be screened.  Ask your health care provider whether you should take a calcium or vitamin D supplement to lower your risk for osteoporosis.  Menopause may have certain physical symptoms and risks.  Hormone replacement therapy may reduce some of these symptoms and risks. Talk to your health care provider about whether hormone replacement therapy is right for you. Follow these instructions at home:  Schedule regular health, dental, and eye exams.  Stay current with your immunizations.  Do not use any tobacco products including cigarettes, chewing tobacco, or electronic cigarettes.  If you are pregnant, do not drink alcohol.  If you are breastfeeding, limit  how much and how often you drink alcohol.  Limit alcohol intake to no more than 1 drink per day for nonpregnant women. One drink equals 12 ounces of beer, 5 ounces of wine, or 1 ounces of hard liquor.  Do not use street drugs.  Do not share needles.  Ask your health care provider for help if you need support or information about quitting drugs.  Tell your health care provider if you often feel depressed.  Tell your health care provider if you have ever been abused or do not feel safe at home. This information is not intended to replace advice given to you by your health care provider. Make sure you discuss any questions you have with your health care provider. Document Released: 07/18/2010 Document Revised: 06/10/2015 Document Reviewed: 10/06/2014 Elsevier Interactive Patient Education  2017 [employer].

## 2016-03-20 NOTE — Progress Notes (Signed)
   Subjective:    Patient ID: Diana Hendrix, female    DOB: 1945-09-12, 71 y.o.   MRN: [id number]  HPI Here for medicare wellness, no new complaints. Please see A/P for status and treatment of chronic medical problems.  HPI #2: coming in for follow up of her hypertension (no recent EKG in the last several years, BP mildly elevated today but typically at goal, significant family hx of CAD and heart attack, denies SOB or chest pains or abdominal pain) and her hyperlipidemia (needs lipid panel, goal <130 but ideal <100 due to family history, taking simvastatin 10 mg daily, no side effects, denies chest pains or SOB), and her weight (BMI 35.4, not dieting at all due to loss of motivation in prior attempts with lack of results, she has stopped drinking sodas and decreased bread intake, not exercising at all right now, complicated by GERD, OSA, HTN, hyperlipidemia).   Diet: heart healthy  Physical activity: sedentary Depression/mood screen: negative Hearing: intact to whispered voice Visual acuity: grossly normal, performs annual eye exam  ADLs: capable Fall risk: none Home safety: good Cognitive evaluation: intact to orientation, naming, recall and repetition EOL planning: adv directives discussed  I have personally reviewed and have noted 1. The patient's medical and social history - reviewed today no changes 2. Their use of alcohol, tobacco or illicit drugs 3. Their current medications and supplements 4. The patient's functional ability including ADL's, fall risks, home safety risks and hearing or visual impairment. 5. Diet and physical activities 6. Evidence for depression or mood disorders 7. Care team reviewed and updated (available in snapshot)  Review of Systems  Constitutional: Positive for activity change. Negative for appetite change, fatigue, fever and unexpected weight change.  HENT: Negative.   Eyes: Negative.   Respiratory: Negative for cough, chest tightness and shortness  of breath.   Cardiovascular: Negative for chest pain, palpitations and leg swelling.  Gastrointestinal: Negative for abdominal distention, abdominal pain, constipation, diarrhea, nausea and vomiting.  Musculoskeletal: Negative.   Skin: Negative.   Neurological: Negative.   Psychiatric/Behavioral: Negative.       Objective:   Physical Exam  Constitutional: She is oriented to person, place, and time. She appears well-developed and well-nourished.  Overweight  HENT:  Head: Normocephalic and atraumatic.  Eyes: EOM are normal.  Neck: Normal range of motion.  Cardiovascular: Normal rate and regular rhythm.   Pulmonary/Chest: Effort normal and breath sounds normal. No respiratory distress. She has no wheezes. She has no rales.  Abdominal: Soft. Bowel sounds are normal. She exhibits no distension. There is no tenderness. There is no rebound.  Musculoskeletal: She exhibits no edema.  Neurological: She is alert and oriented to person, place, and time. Coordination normal.  Skin: Skin is warm and dry.  Psychiatric: She has a normal mood and affect.   Vitals:   03/20/16 0856  BP: (!) 142/80  Pulse: 65  Temp: 97.9 F (36.6 C)  TempSrc: Oral  SpO2: 97%  Weight: 200 lb (90.7 kg)  Height: 5\' 3"  (1.6 m)   EKG: Rate 58, axis normal, intervals normal, sinus, no st or t wave changes, no significant change from prior.    Assessment & Plan:

## 2016-03-25 ENCOUNTER — Other Ambulatory Visit: Payer: Self-pay | Admitting: Internal Medicine

## 2016-04-02 ENCOUNTER — Other Ambulatory Visit: Payer: Self-pay | Admitting: Internal Medicine

## 2016-05-17 DIAGNOSIS — M25561 Pain in right knee: Secondary | ICD-10-CM | POA: Diagnosis not present

## 2016-05-17 DIAGNOSIS — M25461 Effusion, right knee: Secondary | ICD-10-CM | POA: Diagnosis not present

## 2016-05-17 DIAGNOSIS — M25462 Effusion, left knee: Secondary | ICD-10-CM | POA: Diagnosis not present

## 2016-05-17 DIAGNOSIS — G8929 Other chronic pain: Secondary | ICD-10-CM | POA: Insufficient documentation

## 2016-05-17 DIAGNOSIS — M7651 Patellar tendinitis, right knee: Secondary | ICD-10-CM | POA: Diagnosis not present

## 2016-05-17 DIAGNOSIS — M25562 Pain in left knee: Secondary | ICD-10-CM | POA: Diagnosis not present

## 2016-05-17 DIAGNOSIS — M170 Bilateral primary osteoarthritis of knee: Secondary | ICD-10-CM | POA: Diagnosis not present

## 2016-06-09 DIAGNOSIS — M1711 Unilateral primary osteoarthritis, right knee: Secondary | ICD-10-CM | POA: Diagnosis not present

## 2016-06-09 DIAGNOSIS — G8929 Other chronic pain: Secondary | ICD-10-CM | POA: Diagnosis not present

## 2016-06-10 ENCOUNTER — Other Ambulatory Visit: Payer: Self-pay | Admitting: Internal Medicine

## 2016-07-12 ENCOUNTER — Telehealth: Payer: Self-pay | Admitting: Pulmonary Disease

## 2016-07-12 DIAGNOSIS — G4733 Obstructive sleep apnea (adult) (pediatric): Secondary | ICD-10-CM

## 2016-07-12 NOTE — Telephone Encounter (Signed)
Pt requesting an order for cpap supplies to be sent to [facility].  This order has been placed.  Nothing further needed.

## 2016-07-24 DIAGNOSIS — H43812 Vitreous degeneration, left eye: Secondary | ICD-10-CM | POA: Diagnosis not present

## 2016-08-10 DIAGNOSIS — M1711 Unilateral primary osteoarthritis, right knee: Secondary | ICD-10-CM | POA: Diagnosis not present

## 2016-08-10 DIAGNOSIS — M25361 Other instability, right knee: Secondary | ICD-10-CM | POA: Diagnosis not present

## 2016-08-18 DIAGNOSIS — S83231A Complex tear of medial meniscus, current injury, right knee, initial encounter: Secondary | ICD-10-CM | POA: Diagnosis not present

## 2016-08-18 DIAGNOSIS — S83241A Other tear of medial meniscus, current injury, right knee, initial encounter: Secondary | ICD-10-CM | POA: Diagnosis not present

## 2016-08-18 DIAGNOSIS — G8929 Other chronic pain: Secondary | ICD-10-CM | POA: Diagnosis not present

## 2016-08-22 DIAGNOSIS — M1711 Unilateral primary osteoarthritis, right knee: Secondary | ICD-10-CM | POA: Diagnosis not present

## 2016-08-22 DIAGNOSIS — S83241A Other tear of medial meniscus, current injury, right knee, initial encounter: Secondary | ICD-10-CM | POA: Diagnosis not present

## 2016-08-22 DIAGNOSIS — H43813 Vitreous degeneration, bilateral: Secondary | ICD-10-CM | POA: Diagnosis not present

## 2016-08-25 ENCOUNTER — Encounter: Payer: Self-pay | Admitting: Pulmonary Disease

## 2016-08-25 ENCOUNTER — Ambulatory Visit (INDEPENDENT_AMBULATORY_CARE_PROVIDER_SITE_OTHER): Payer: Medicare Other | Admitting: Pulmonary Disease

## 2016-08-25 VITALS — BP 102/70 | HR 64 | Ht 63.0 in | Wt 203.0 lb

## 2016-08-25 DIAGNOSIS — J310 Chronic rhinitis: Secondary | ICD-10-CM | POA: Diagnosis not present

## 2016-08-25 DIAGNOSIS — R05 Cough: Secondary | ICD-10-CM | POA: Diagnosis not present

## 2016-08-25 DIAGNOSIS — Z9989 Dependence on other enabling machines and devices: Secondary | ICD-10-CM

## 2016-08-25 DIAGNOSIS — R058 Other specified cough: Secondary | ICD-10-CM

## 2016-08-25 DIAGNOSIS — G4733 Obstructive sleep apnea (adult) (pediatric): Secondary | ICD-10-CM | POA: Diagnosis not present

## 2016-08-25 MED ORDER — FLUTICASONE PROPIONATE 50 MCG/ACT NA SUSP: 2.0000 | Freq: Every day | NASAL | 2 refills | Status: DC

## 2016-08-25 NOTE — Progress Notes (Signed)
Current Outpatient Prescriptions on File Prior to Visit  Medication Sig  . amitriptyline (ELAVIL) 10 MG tablet TAKE 2 TABLETS(20 MG) BY MOUTH AT BEDTIME  . amLODipine (NORVASC) 5 MG tablet Take 5 mg by mouth daily.  . Cholecalciferol (VITAMIN D-3) 1000 UNITS CAPS Take 1 capsule by mouth daily.   . citalopram (CELEXA) 10 MG tablet TAKE 1 TABLET(10 MG) BY MOUTH DAILY  . metoprolol succinate (TOPROL-XL) 50 MG 24 hr tablet Take 75 mg by mouth daily. Take with or immediately following a meal.   . olmesartan-hydrochlorothiazide (BENICAR HCT) 40-12.5 MG per tablet Take 1 tablet by mouth daily.  . pantoprazole (PROTONIX) 40 MG tablet TAKE 1 TABLET(40 MG) BY MOUTH DAILY  . polyethylene glycol powder (GLYCOLAX/MIRALAX) powder MIX 1 CAPFUL IN 8 OUNCES OF WATER AND DRINK ONCE A DAY  . simvastatin (ZOCOR) 10 MG tablet TAKE 1 TABLET(10 MG) BY MOUTH AT BEDTIME   No current facility-administered medications on file prior to visit.     Chief Complaint  Patient presents with  . Follow-up    Pt denies any increased cough - uses OTC cough/mucus medication. Pt reports congested cough until about noon daily. Wears CPAP nightly.  Denies problems with mask/pressure setting. DME: [facility]    Sleep tests PSG 09/17/12 >> RDI 13.7, SpO2 low 90%, PLMI 0, occasional PVC's and PAC's. Auto CPAP 07/26/16 to 08/24/16 >> used on 30 of 30 nights with average 8 hrs 46 min.  Average AHI 1.8 with median CPAP 10 and 95 th percentile CPAP 12 cm H2O  Past medical history HTN, HLD, GERD, Macular degeneration  Past surgical history, Family history, Social history, Allergies reviewed.  Vital signs BP 102/70 (BP Location: Left Arm, Cuff Size: Normal)   Pulse 64   Ht 5\' 3"  (1.6 m)   Wt 203 lb (92.1 kg)   SpO2 96%   BMI 35.96 kg/m   CC: [name], [facility]  History of Present Illness: Diana Hendrix is a 71 y.o. female with OSA.  She is still having sinus congestion, dry mouth, and scratchy throat.  She  has to clear her throat frequently.  She also gets dry eyes.  She tried increasing temperature on humidifier, but then had water build up.  She is using nasal cup mask.  This leaks sometimes.  She is not having fever, wheeze, sputum, or chest pain.  Physical Exam:  General - pleasant Eyes - pupils reactive, wears glasses ENT - no sinus tenderness, no oral exudate, no LAN, MP 4, scalloped tongue, high arch palate, retrognathic Cardiac - regular, no murmur Chest - no wheeze, rales Abd - soft, non tender Ext - no edema Skin - no rashes Neuro - normal strength Psych - normal mood    Assessment/Plan:  Obstructive sleep apnea. 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 - will have her adjust her humidifier setting and try flonase  Obesity. - reviewed options to assist with weight loss   Patient Instructions  Try using flonase 1 spray in each nostril nightly for next two weeks, then as needed  Follow up in 1 year    Chesley Mires, MD [facility] Pager:  [phone] 08/25/2016, 9:25 AM

## 2016-08-25 NOTE — Patient Instructions (Addendum)
Try using flonase 1 spray in each nostril nightly for next two weeks, then as needed  Follow up in 1 year

## 2016-09-13 DIAGNOSIS — M659 Synovitis and tenosynovitis, unspecified: Secondary | ICD-10-CM | POA: Diagnosis not present

## 2016-09-13 DIAGNOSIS — S83231A Complex tear of medial meniscus, current injury, right knee, initial encounter: Secondary | ICD-10-CM | POA: Diagnosis not present

## 2016-09-13 DIAGNOSIS — S83262A Peripheral tear of lateral meniscus, current injury, left knee, initial encounter: Secondary | ICD-10-CM | POA: Diagnosis not present

## 2016-09-13 DIAGNOSIS — M6752 Plica syndrome, left knee: Secondary | ICD-10-CM | POA: Diagnosis not present

## 2016-09-13 DIAGNOSIS — M94262 Chondromalacia, left knee: Secondary | ICD-10-CM | POA: Diagnosis not present

## 2016-09-13 DIAGNOSIS — Y999 Unspecified external cause status: Secondary | ICD-10-CM | POA: Diagnosis not present

## 2016-09-13 DIAGNOSIS — S83261A Peripheral tear of lateral meniscus, current injury, right knee, initial encounter: Secondary | ICD-10-CM | POA: Diagnosis not present

## 2016-09-13 DIAGNOSIS — S83232A Complex tear of medial meniscus, current injury, left knee, initial encounter: Secondary | ICD-10-CM | POA: Diagnosis not present

## 2016-09-13 DIAGNOSIS — G8918 Other acute postprocedural pain: Secondary | ICD-10-CM | POA: Diagnosis not present

## 2016-09-13 HISTORY — PX: MENISCUS REPAIR: SHX5179

## 2016-09-19 ENCOUNTER — Other Ambulatory Visit: Payer: Self-pay | Admitting: Internal Medicine

## 2016-09-20 ENCOUNTER — Ambulatory Visit: Payer: Medicare Other | Admitting: Internal Medicine

## 2016-09-21 DIAGNOSIS — Z9889 Other specified postprocedural states: Secondary | ICD-10-CM | POA: Insufficient documentation

## 2016-09-22 DIAGNOSIS — R2689 Other abnormalities of gait and mobility: Secondary | ICD-10-CM | POA: Diagnosis not present

## 2016-09-22 DIAGNOSIS — M25561 Pain in right knee: Secondary | ICD-10-CM | POA: Diagnosis not present

## 2016-09-22 DIAGNOSIS — M62551 Muscle wasting and atrophy, not elsewhere classified, right thigh: Secondary | ICD-10-CM | POA: Diagnosis not present

## 2016-09-25 DIAGNOSIS — M25561 Pain in right knee: Secondary | ICD-10-CM | POA: Diagnosis not present

## 2016-09-25 DIAGNOSIS — R2689 Other abnormalities of gait and mobility: Secondary | ICD-10-CM | POA: Diagnosis not present

## 2016-09-25 DIAGNOSIS — M62551 Muscle wasting and atrophy, not elsewhere classified, right thigh: Secondary | ICD-10-CM | POA: Diagnosis not present

## 2016-09-26 DIAGNOSIS — I10 Essential (primary) hypertension: Secondary | ICD-10-CM | POA: Diagnosis not present

## 2016-09-26 DIAGNOSIS — R809 Proteinuria, unspecified: Secondary | ICD-10-CM | POA: Diagnosis not present

## 2016-09-26 DIAGNOSIS — R1033 Periumbilical pain: Secondary | ICD-10-CM | POA: Diagnosis not present

## 2016-09-26 DIAGNOSIS — E7800 Pure hypercholesterolemia, unspecified: Secondary | ICD-10-CM | POA: Diagnosis not present

## 2016-09-26 DIAGNOSIS — N189 Chronic kidney disease, unspecified: Secondary | ICD-10-CM | POA: Diagnosis not present

## 2016-09-26 DIAGNOSIS — D649 Anemia, unspecified: Secondary | ICD-10-CM | POA: Diagnosis not present

## 2016-09-26 DIAGNOSIS — E119 Type 2 diabetes mellitus without complications: Secondary | ICD-10-CM | POA: Diagnosis not present

## 2016-09-28 DIAGNOSIS — R1011 Right upper quadrant pain: Secondary | ICD-10-CM | POA: Diagnosis not present

## 2016-09-29 DIAGNOSIS — M25561 Pain in right knee: Secondary | ICD-10-CM | POA: Diagnosis not present

## 2016-09-29 DIAGNOSIS — M62551 Muscle wasting and atrophy, not elsewhere classified, right thigh: Secondary | ICD-10-CM | POA: Diagnosis not present

## 2016-09-29 DIAGNOSIS — R2689 Other abnormalities of gait and mobility: Secondary | ICD-10-CM | POA: Diagnosis not present

## 2016-09-30 ENCOUNTER — Other Ambulatory Visit: Payer: Self-pay | Admitting: Internal Medicine

## 2016-10-03 DIAGNOSIS — R2689 Other abnormalities of gait and mobility: Secondary | ICD-10-CM | POA: Diagnosis not present

## 2016-10-03 DIAGNOSIS — M25561 Pain in right knee: Secondary | ICD-10-CM | POA: Diagnosis not present

## 2016-10-03 DIAGNOSIS — M62551 Muscle wasting and atrophy, not elsewhere classified, right thigh: Secondary | ICD-10-CM | POA: Diagnosis not present

## 2016-10-06 DIAGNOSIS — R2689 Other abnormalities of gait and mobility: Secondary | ICD-10-CM | POA: Diagnosis not present

## 2016-10-06 DIAGNOSIS — M25561 Pain in right knee: Secondary | ICD-10-CM | POA: Diagnosis not present

## 2016-10-06 DIAGNOSIS — M62551 Muscle wasting and atrophy, not elsewhere classified, right thigh: Secondary | ICD-10-CM | POA: Diagnosis not present

## 2016-10-09 ENCOUNTER — Encounter: Payer: Self-pay | Admitting: Internal Medicine

## 2016-10-09 ENCOUNTER — Ambulatory Visit (INDEPENDENT_AMBULATORY_CARE_PROVIDER_SITE_OTHER): Payer: Medicare Other | Admitting: Internal Medicine

## 2016-10-09 VITALS — BP 110/70 | HR 65 | Temp 98.1°F | Ht 63.0 in | Wt 206.0 lb

## 2016-10-09 DIAGNOSIS — I10 Essential (primary) hypertension: Secondary | ICD-10-CM

## 2016-10-09 DIAGNOSIS — G4700 Insomnia, unspecified: Secondary | ICD-10-CM | POA: Diagnosis not present

## 2016-10-09 DIAGNOSIS — Z23 Encounter for immunization: Secondary | ICD-10-CM | POA: Diagnosis not present

## 2016-10-09 DIAGNOSIS — R7301 Impaired fasting glucose: Secondary | ICD-10-CM

## 2016-10-09 NOTE — Progress Notes (Signed)
   Subjective:    Patient ID: Diana Hendrix, female    DOB: [birth year], 71 y.o.   MRN: [id number]  HPI The patient is a 71 YO female coming in for follow up of her blood pressure (taking amlodipine and metoprolol and olmesartan/hctz with BP at goal and no side effects, denies headaches or chest pains), and her mood (taking celexa daily and denies worsening depression or anxiety, she feels the level of control is good, denies SI/HI, no sleeping problems). No new concerns. She is also working on decreasing sugar and carbs in her diet due to mildly elevated sugar levels (last Hga1c 6.1).   Review of Systems  Constitutional: Negative.   Respiratory: Negative for cough, chest tightness and shortness of breath.   Cardiovascular: Negative for chest pain, palpitations and leg swelling.  Gastrointestinal: Negative for abdominal distention, abdominal pain, constipation, diarrhea, nausea and vomiting.  Musculoskeletal: Negative.   Skin: Negative.   Neurological: Negative.   Psychiatric/Behavioral: Negative.  Negative for behavioral problems, confusion, decreased concentration, dysphoric mood and sleep disturbance.      Objective:   Physical Exam  Constitutional: She is oriented to person, place, and time. She appears well-developed and well-nourished.  HENT:  Head: Normocephalic and atraumatic.  Eyes: EOM are normal.  Neck: Normal range of motion.  Cardiovascular: Normal rate and regular rhythm.   Pulmonary/Chest: Effort normal and breath sounds normal. No respiratory distress. She has no wheezes. She has no rales.  Abdominal: Soft. Bowel sounds are normal. She exhibits no distension. There is no tenderness. There is no rebound.  Musculoskeletal: She exhibits no edema.  Neurological: She is alert and oriented to person, place, and time. Coordination normal.  Skin: Skin is warm and dry.  Psychiatric: She has a normal mood and affect.   Vitals:   10/09/16 1334  BP: 110/70  Pulse: 65  Temp:  98.1 F (36.7 C)  TempSrc: Oral  SpO2: 98%  Weight: 206 lb (93.4 kg)  Height: 5\' 3"  (1.6 m)      Assessment & Plan:  Flu shot given at visit

## 2016-10-09 NOTE — Patient Instructions (Signed)
Keep working on the exercise and cutting back on sugars and carbs in the diet.

## 2016-10-11 ENCOUNTER — Encounter: Payer: Self-pay | Admitting: Internal Medicine

## 2016-10-11 DIAGNOSIS — R7301 Impaired fasting glucose: Secondary | ICD-10-CM | POA: Insufficient documentation

## 2016-10-11 DIAGNOSIS — I152 Hypertension secondary to endocrine disorders: Secondary | ICD-10-CM

## 2016-10-11 NOTE — Assessment & Plan Note (Signed)
BP at goal on metoprolol, amlodipine, olmesartan/hctz and recent CMP at goal without need for changes.

## 2016-10-11 NOTE — Assessment & Plan Note (Signed)
Does have pre-diabetes but not diabetes and removed modifier from her chart. Not on medications but is working on diet and exercise to prevent progression to diabetes.

## 2016-10-11 NOTE — Assessment & Plan Note (Signed)
Taking celexa for insomnia and mood and doing well on 10 mg daily. Does not wish to try to stop at this time.

## 2016-10-11 NOTE — Assessment & Plan Note (Signed)
>>  ASSESSMENT AND PLAN FOR IMPAIRED FASTING BLOOD SUGAR WRITTEN ON 10/11/2016 11:20 AM BY CRAWFORD, ELIZABETH A, MD  Does have pre-diabetes but not diabetes and removed modifier from her chart. Not on medications but is working on diet and exercise to prevent progression to diabetes.

## 2016-11-21 LAB — HM MAMMOGRAPHY

## 2016-11-22 ENCOUNTER — Encounter: Payer: Self-pay | Admitting: Internal Medicine

## 2016-11-22 DIAGNOSIS — Z1231 Encounter for screening mammogram for malignant neoplasm of breast: Secondary | ICD-10-CM | POA: Diagnosis not present

## 2016-12-02 ENCOUNTER — Other Ambulatory Visit: Payer: Self-pay | Admitting: Internal Medicine

## 2016-12-16 ENCOUNTER — Other Ambulatory Visit: Payer: Self-pay | Admitting: Internal Medicine

## 2016-12-19 DIAGNOSIS — M1711 Unilateral primary osteoarthritis, right knee: Secondary | ICD-10-CM | POA: Diagnosis not present

## 2016-12-19 DIAGNOSIS — Z9889 Other specified postprocedural states: Secondary | ICD-10-CM | POA: Diagnosis not present

## 2016-12-21 ENCOUNTER — Other Ambulatory Visit: Payer: Self-pay

## 2016-12-30 ENCOUNTER — Other Ambulatory Visit: Payer: Self-pay | Admitting: Internal Medicine

## 2017-01-16 HISTORY — PX: OTHER SURGICAL HISTORY: SHX169

## 2017-01-16 HISTORY — PX: MENISCUS REPAIR: SHX5179

## 2017-01-30 DIAGNOSIS — S39012A Strain of muscle, fascia and tendon of lower back, initial encounter: Secondary | ICD-10-CM | POA: Diagnosis not present

## 2017-03-03 ENCOUNTER — Other Ambulatory Visit: Payer: Self-pay | Admitting: Internal Medicine

## 2017-03-08 DIAGNOSIS — H524 Presbyopia: Secondary | ICD-10-CM | POA: Diagnosis not present

## 2017-03-08 DIAGNOSIS — H04123 Dry eye syndrome of bilateral lacrimal glands: Secondary | ICD-10-CM | POA: Diagnosis not present

## 2017-03-17 ENCOUNTER — Other Ambulatory Visit: Payer: Self-pay | Admitting: Internal Medicine

## 2017-03-26 DIAGNOSIS — N189 Chronic kidney disease, unspecified: Secondary | ICD-10-CM | POA: Diagnosis not present

## 2017-03-26 DIAGNOSIS — E7800 Pure hypercholesterolemia, unspecified: Secondary | ICD-10-CM | POA: Diagnosis not present

## 2017-03-26 DIAGNOSIS — I10 Essential (primary) hypertension: Secondary | ICD-10-CM | POA: Diagnosis not present

## 2017-03-26 DIAGNOSIS — E119 Type 2 diabetes mellitus without complications: Secondary | ICD-10-CM | POA: Diagnosis not present

## 2017-03-26 DIAGNOSIS — D649 Anemia, unspecified: Secondary | ICD-10-CM | POA: Diagnosis not present

## 2017-03-31 ENCOUNTER — Other Ambulatory Visit: Payer: Self-pay | Admitting: Internal Medicine

## 2017-04-09 ENCOUNTER — Ambulatory Visit (INDEPENDENT_AMBULATORY_CARE_PROVIDER_SITE_OTHER): Payer: Medicare Other | Admitting: Internal Medicine

## 2017-04-09 ENCOUNTER — Other Ambulatory Visit (INDEPENDENT_AMBULATORY_CARE_PROVIDER_SITE_OTHER): Payer: Medicare Other

## 2017-04-09 ENCOUNTER — Encounter: Payer: Self-pay | Admitting: Internal Medicine

## 2017-04-09 VITALS — BP 116/70 | HR 59 | Temp 98.0°F | Ht 63.0 in | Wt 205.0 lb

## 2017-04-09 DIAGNOSIS — E782 Mixed hyperlipidemia: Secondary | ICD-10-CM | POA: Diagnosis not present

## 2017-04-09 DIAGNOSIS — R7301 Impaired fasting glucose: Secondary | ICD-10-CM

## 2017-04-09 DIAGNOSIS — Z0000 Encounter for general adult medical examination without abnormal findings: Secondary | ICD-10-CM

## 2017-04-09 DIAGNOSIS — I10 Essential (primary) hypertension: Secondary | ICD-10-CM

## 2017-04-09 LAB — COMPREHENSIVE METABOLIC PANEL
ALK PHOS: 84 U/L (ref 39–117)
ALT: 12 U/L (ref 0–35)
AST: 17 U/L (ref 0–37)
Albumin: 3.9 g/dL (ref 3.5–5.2)
BUN: 16 mg/dL (ref 6–23)
CO2: 29 meq/L (ref 19–32)
Calcium: 9.6 mg/dL (ref 8.4–10.5)
Chloride: 98 mEq/L (ref 96–112)
Creatinine, Ser: 0.92 mg/dL (ref 0.40–1.20)
GFR: 64.01 mL/min (ref >60.00)
GLUCOSE: 105 mg/dL — AB (ref 70–99)
POTASSIUM: 3.9 meq/L (ref 3.5–5.1)
SODIUM: 134 meq/L — AB (ref 135–145)
TOTAL PROTEIN: 7.5 g/dL (ref 6.0–8.3)
Total Bilirubin: 0.6 mg/dL (ref 0.2–1.2)

## 2017-04-09 LAB — CBC
HEMATOCRIT: 38.6 % (ref 36.0–46.0)
HEMOGLOBIN: 13.2 g/dL (ref 12.0–15.0)
MCHC: 34.3 g/dL (ref 30.0–36.0)
MCV: 88.1 fl (ref 78.0–100.0)
PLATELETS: 271 10*3/uL (ref 150.0–400.0)
RBC: 4.38 Mil/uL (ref 3.87–5.11)
RDW: 13.6 % (ref 11.5–15.5)
WBC: 5.1 10*3/uL (ref 4.0–10.5)

## 2017-04-09 LAB — LIPID PANEL
Cholesterol: 150 mg/dL (ref 0–200)
HDL: 43.2 mg/dL (ref >39.00)
LDL Cholesterol: 81 mg/dL (ref 0–99)
NONHDL: 107.25
Total CHOL/HDL Ratio: 3
Triglycerides: 130 mg/dL (ref 0.0–149.0)
VLDL: 26 mg/dL (ref 0.0–40.0)

## 2017-04-09 LAB — HEMOGLOBIN A1C: Hgb A1c MFr Bld: 6.1 % (ref 4.6–6.5)

## 2017-04-09 NOTE — Patient Instructions (Signed)
We will check the labs today and call you back about the results.   Health Maintenance, Female Adopting a healthy lifestyle and getting preventive care can go a long way to promote health and wellness. Talk with your health care provider about what schedule of regular examinations is right for you. This is a good chance for you to check in with your provider about disease prevention and staying healthy. In between checkups, there are plenty of things you can do on your own. Experts have done a lot of research about which lifestyle changes and preventive measures are most likely to keep you healthy. Ask your health care provider for more information. Weight and diet Eat a healthy diet  Be sure to include plenty of vegetables, fruits, low-fat dairy products, and lean protein.  Do not eat a lot of foods high in solid fats, added sugars, or salt.  Get regular exercise. This is one of the most important things you can do for your health. ? Most adults should exercise for at least 150 minutes each week. The exercise should increase your heart rate and make you sweat (moderate-intensity exercise). ? Most adults should also do strengthening exercises at least twice a week. This is in addition to the moderate-intensity exercise.  Maintain a healthy weight  Body mass index (BMI) is a measurement that can be used to identify possible weight problems. It estimates body fat based on height and weight. Your health care provider can help determine your BMI and help you achieve or maintain a healthy weight.  For females 20 years of age and older: ? A BMI below 18.5 is considered underweight. ? A BMI of 18.5 to 24.9 is normal. ? A BMI of 25 to 29.9 is considered overweight. ? A BMI of 30 and above is considered obese.  Watch levels of cholesterol and blood lipids  You should start having your blood tested for lipids and cholesterol at 71 years of age, then have this test every 5 years.  You may need to  have your cholesterol levels checked more often if: ? Your lipid or cholesterol levels are high. ? You are older than 71 years of age. ? You are at high risk for heart disease.  Cancer screening Lung Cancer  Lung cancer screening is recommended for adults 55-80 years old who are at high risk for lung cancer because of a history of smoking.  A yearly low-dose CT scan of the lungs is recommended for people who: ? Currently smoke. ? Have quit within the past 15 years. ? Have at least a 30-pack-year history of smoking. A pack year is smoking an average of one pack of cigarettes a day for 1 year.  Yearly screening should continue until it has been 15 years since you quit.  Yearly screening should stop if you develop a health problem that would prevent you from having lung cancer treatment.  Breast Cancer  Practice breast self-awareness. This means understanding how your breasts normally appear and feel.  It also means doing regular breast self-exams. Let your health care provider know about any changes, no matter how small.  If you are in your 20s or 30s, you should have a clinical breast exam (CBE) by a health care provider every 1-3 years as part of a regular health exam.  If you are 40 or older, have a CBE every year. Also consider having a breast X-ray (mammogram) every year.  If you have a family history of breast cancer, talk   to your health care provider about genetic screening.  If you are at high risk for breast cancer, talk to your health care provider about having an MRI and a mammogram every year.  Breast cancer gene (BRCA) assessment is recommended for women who have family members with BRCA-related cancers. BRCA-related cancers include: ? Breast. ? Ovarian. ? Tubal. ? Peritoneal cancers.  Results of the assessment will determine the need for genetic counseling and BRCA1 and BRCA2 testing.  Cervical Cancer Your health care provider may recommend that you be screened  regularly for cancer of the pelvic organs (ovaries, uterus, and vagina). This screening involves a pelvic examination, including checking for microscopic changes to the surface of your cervix (Pap test). You may be encouraged to have this screening done every 3 years, beginning at age 21.  For women ages 30-65, health care providers may recommend pelvic exams and Pap testing every 3 years, or they may recommend the Pap and pelvic exam, combined with testing for human papilloma virus (HPV), every 5 years. Some types of HPV increase your risk of cervical cancer. Testing for HPV may also be done on women of any age with unclear Pap test results.  Other health care providers may not recommend any screening for nonpregnant women who are considered low risk for pelvic cancer and who do not have symptoms. Ask your health care provider if a screening pelvic exam is right for you.  If you have had past treatment for cervical cancer or a condition that could lead to cancer, you need Pap tests and screening for cancer for at least 20 years after your treatment. If Pap tests have been discontinued, your risk factors (such as having a new sexual partner) need to be reassessed to determine if screening should resume. Some women have medical problems that increase the chance of getting cervical cancer. In these cases, your health care provider may recommend more frequent screening and Pap tests.  Colorectal Cancer  This type of cancer can be detected and often prevented.  Routine colorectal cancer screening usually begins at 71 years of age and continues through 71 years of age.  Your health care provider may recommend screening at an earlier age if you have risk factors for colon cancer.  Your health care provider may also recommend using home test kits to check for hidden blood in the stool.  A small camera at the end of a tube can be used to examine your colon directly (sigmoidoscopy or colonoscopy). This is  done to check for the earliest forms of colorectal cancer.  Routine screening usually begins at age 50.  Direct examination of the colon should be repeated every 5-10 years through 71 years of age. However, you may need to be screened more often if early forms of precancerous polyps or small growths are found.  Skin Cancer  Check your skin from head to toe regularly.  Tell your health care provider about any new moles or changes in moles, especially if there is a change in a mole's shape or color.  Also tell your health care provider if you have a mole that is larger than the size of a pencil eraser.  Always use sunscreen. Apply sunscreen liberally and repeatedly throughout the day.  Protect yourself by wearing long sleeves, pants, a wide-brimmed hat, and sunglasses whenever you are outside.  Heart disease, diabetes, and high blood pressure  High blood pressure causes heart disease and increases the risk of stroke. High blood pressure is more   likely to develop in: ? People who have blood pressure in the high end of the normal range (130-139/85-89 mm Hg). ? People who are overweight or obese. ? People who are African American.  If you are 90-95 years of age, have your blood pressure checked every 3-5 years. If you are 4 years of age or older, have your blood pressure checked every year. You should have your blood pressure measured twice-once when you are at a hospital or clinic, and once when you are not at a hospital or clinic. Record the average of the two measurements. To check your blood pressure when you are not at a hospital or clinic, you can use: ? An automated blood pressure machine at a pharmacy. ? A home blood pressure monitor.  If you are between 44 years and 21 years old, ask your health care provider if you should take aspirin to prevent strokes.  Have regular diabetes screenings. This involves taking a blood sample to check your fasting blood sugar level. ? If you are  at a normal weight and have a low risk for diabetes, have this test once every three years after 71 years of age. ? If you are overweight and have a high risk for diabetes, consider being tested at a younger age or more often. Preventing infection Hepatitis B  If you have a higher risk for hepatitis B, you should be screened for this virus. You are considered at high risk for hepatitis B if: ? You were born in a country where hepatitis B is common. Ask your health care provider which countries are considered high risk. ? Your parents were born in a high-risk country, and you have not been immunized against hepatitis B (hepatitis B vaccine). ? You have HIV or AIDS. ? You use needles to inject street drugs. ? You live with someone who has hepatitis B. ? You have had sex with someone who has hepatitis B. ? You get hemodialysis treatment. ? You take certain medicines for conditions, including cancer, organ transplantation, and autoimmune conditions.  Hepatitis C  Blood testing is recommended for: ? Everyone born from [birth year] through 1964. ? Anyone with known risk factors for hepatitis C.  Sexually transmitted infections (STIs)  You should be screened for sexually transmitted infections (STIs) including gonorrhea and chlamydia if: ? You are sexually active and are younger than 71 years of age. ? You are older than 71 years of age and your health care provider tells you that you are at risk for this type of infection. ? Your sexual activity has changed since you were last screened and you are at an increased risk for chlamydia or gonorrhea. Ask your health care provider if you are at risk.  If you do not have HIV, but are at risk, it may be recommended that you take a prescription medicine daily to prevent HIV infection. This is called pre-exposure prophylaxis (PrEP). You are considered at risk if: ? You are sexually active and do not regularly use condoms or know the HIV status of your  partner(s). ? You take drugs by injection. ? You are sexually active with a partner who has HIV.  Talk with your health care provider about whether you are at high risk of being infected with HIV. If you choose to begin PrEP, you should first be tested for HIV. You should then be tested every 3 months for as long as you are taking PrEP. Pregnancy  If you are premenopausal and you may  become pregnant, ask your health care provider about preconception counseling.  If you may become pregnant, take 400 to 800 micrograms (mcg) of folic acid every day.  If you want to prevent pregnancy, talk to your health care provider about birth control (contraception). Osteoporosis and menopause  Osteoporosis is a disease in which the bones lose minerals and strength with aging. This can result in serious bone fractures. Your risk for osteoporosis can be identified using a bone density scan.  If you are 40 years of age or older, or if you are at risk for osteoporosis and fractures, ask your health care provider if you should be screened.  Ask your health care provider whether you should take a calcium or vitamin D supplement to lower your risk for osteoporosis.  Menopause may have certain physical symptoms and risks.  Hormone replacement therapy may reduce some of these symptoms and risks. Talk to your health care provider about whether hormone replacement therapy is right for you. Follow these instructions at home:  Schedule regular health, dental, and eye exams.  Stay current with your immunizations.  Do not use any tobacco products including cigarettes, chewing tobacco, or electronic cigarettes.  If you are pregnant, do not drink alcohol.  If you are breastfeeding, limit how much and how often you drink alcohol.  Limit alcohol intake to no more than 1 drink per day for nonpregnant women. One drink equals 12 ounces of beer, 5 ounces of wine, or 1 ounces of hard liquor.  Do not use street  drugs.  Do not share needles.  Ask your health care provider for help if you need support or information about quitting drugs.  Tell your health care provider if you often feel depressed.  Tell your health care provider if you have ever been abused or do not feel safe at home. This information is not intended to replace advice given to you by your health care provider. Make sure you discuss any questions you have with your health care provider. Document Released: 07/18/2010 Document Revised: 06/10/2015 Document Reviewed: 10/06/2014 Elsevier Interactive Patient Education  [employer].

## 2017-04-09 NOTE — Progress Notes (Signed)
   Subjective:    Patient ID: Diana Hendrix, female    DOB: November 04, 1945, 72 y.o.   MRN: [id number]  HPI Here for medicare wellness, no new complaints. Please see A/P for status and treatment of chronic medical problems.   HPI #2: Here for follow up of cholesterol (taking simvastatin, denies side effects, denies new chest pains or stroke symptoms), and her blood pressure (taking olmesartan/hctz) and her impaired fasting blood sugars (exercising some in the last 2-3 weeks, no weight gain but no loss, still working on less carbs and sugars, denies numbness or tingling). No new concerns.   Diet: heart healthy  Physical activity: sedentary, exercises 2 days per week Depression/mood screen: negative Hearing: intact to whispered voice Visual acuity: grossly normal with lens, performs annual eye exam  ADLs: capable Fall risk: none Home safety: good Cognitive evaluation: intact to orientation, naming, recall and repetition EOL planning: adv directives discussed  I have personally reviewed and have noted 1. The patient's medical and social history - reviewed today no changes 2. Their use of alcohol, tobacco or illicit drugs 3. Their current medications and supplements 4. The patient's functional ability including ADL's, fall risks, home safety risks and hearing or visual impairment. 5. Diet and physical activities 6. Evidence for depression or mood disorders 7. Care team reviewed and updated (available in snapshot)  Review of Systems  Constitutional: Negative.   HENT: Negative.   Eyes: Negative.   Respiratory: Negative for cough, chest tightness and shortness of breath.   Cardiovascular: Negative for chest pain, palpitations and leg swelling.  Gastrointestinal: Negative for abdominal distention, abdominal pain, constipation, diarrhea, nausea and vomiting.  Musculoskeletal: Positive for arthralgias.  Skin: Negative.   Neurological: Negative.   Psychiatric/Behavioral: Negative.         Objective:   Physical Exam  Constitutional: She is oriented to person, place, and time. She appears well-developed and well-nourished.  overweight  HENT:  Head: Normocephalic and atraumatic.  Eyes: EOM are normal.  Neck: Normal range of motion.  Cardiovascular: Normal rate and regular rhythm.  Pulmonary/Chest: Effort normal and breath sounds normal. No respiratory distress. She has no wheezes. She has no rales.  Abdominal: Soft. Bowel sounds are normal. She exhibits no distension. There is no tenderness. There is no rebound.  Musculoskeletal: She exhibits no edema.  Neurological: She is alert and oriented to person, place, and time. Coordination normal.  Skin: Skin is warm and dry.  Psychiatric: She has a normal mood and affect.   Vitals:   04/09/17 0925  BP: 116/70  Pulse: (!) 59  Temp: 98 F (36.7 C)  TempSrc: Oral  SpO2: 97%  Weight: 205 lb (93 kg)  Height: 5\' 3"  (1.6 m)      Assessment & Plan:

## 2017-04-12 NOTE — Assessment & Plan Note (Signed)
Checking HGA1c and adjust therapy if needed. She is not exercising as much and would like to do more now that the weather is improving.

## 2017-04-12 NOTE — Assessment & Plan Note (Signed)
Taking simvastatin, checking lipid panel and adjust as needed.

## 2017-04-12 NOTE — Assessment & Plan Note (Signed)
>>  ASSESSMENT AND PLAN FOR IMPAIRED FASTING BLOOD SUGAR WRITTEN ON 04/12/2017  7:41 AM BY CRAWFORD, ELIZABETH A, MD  Checking HGA1c and adjust therapy if needed. She is not exercising as much and would like to do more now that the weather is improving.

## 2017-04-12 NOTE — Assessment & Plan Note (Signed)
Colonoscopy due 2025, mammogram due nov 2020. Flu and tetanus and pneumonia are up to date. Counseled about shingrix. DEXA done. Aged out of pap smear. Counseled about exercise, sun safety and mole surveillance. Given 10 year screening recommendations.

## 2017-04-12 NOTE — Assessment & Plan Note (Signed)
Taking metoprolol, olmesartan/hctz and amlodipine. Checking CMP and adjust as needed. BP at goal today.

## 2017-04-16 ENCOUNTER — Observation Stay (HOSPITAL_COMMUNITY): Payer: Medicare Other

## 2017-04-16 ENCOUNTER — Emergency Department (HOSPITAL_COMMUNITY): Payer: Medicare Other

## 2017-04-16 ENCOUNTER — Inpatient Hospital Stay (HOSPITAL_COMMUNITY)
Admission: EM | Admit: 2017-04-16 | Discharge: 2017-04-17 | DRG: 101 | Disposition: A | Discharge status: Home / Self Care | Payer: Medicare Other | Attending: Internal Medicine | Admitting: Internal Medicine

## 2017-04-16 ENCOUNTER — Encounter (HOSPITAL_COMMUNITY): Payer: Self-pay | Admitting: Emergency Medicine

## 2017-04-16 DIAGNOSIS — R32 Unspecified urinary incontinence: Secondary | ICD-10-CM | POA: Diagnosis not present

## 2017-04-16 DIAGNOSIS — Z9889 Other specified postprocedural states: Secondary | ICD-10-CM | POA: Diagnosis not present

## 2017-04-16 DIAGNOSIS — I4581 Long QT syndrome: Secondary | ICD-10-CM | POA: Diagnosis present

## 2017-04-16 DIAGNOSIS — R55 Syncope and collapse: Secondary | ICD-10-CM

## 2017-04-16 DIAGNOSIS — R9431 Abnormal electrocardiogram [ECG] [EKG]: Secondary | ICD-10-CM

## 2017-04-16 DIAGNOSIS — Z9989 Dependence on other enabling machines and devices: Secondary | ICD-10-CM

## 2017-04-16 DIAGNOSIS — G4733 Obstructive sleep apnea (adult) (pediatric): Secondary | ICD-10-CM | POA: Diagnosis present

## 2017-04-16 DIAGNOSIS — G8929 Other chronic pain: Secondary | ICD-10-CM | POA: Diagnosis not present

## 2017-04-16 DIAGNOSIS — R42 Dizziness and giddiness: Secondary | ICD-10-CM | POA: Diagnosis not present

## 2017-04-16 DIAGNOSIS — R112 Nausea with vomiting, unspecified: Secondary | ICD-10-CM | POA: Diagnosis not present

## 2017-04-16 DIAGNOSIS — Z79899 Other long term (current) drug therapy: Secondary | ICD-10-CM

## 2017-04-16 DIAGNOSIS — R569 Unspecified convulsions: Secondary | ICD-10-CM | POA: Diagnosis not present

## 2017-04-16 DIAGNOSIS — Z91018 Allergy to other foods: Secondary | ICD-10-CM | POA: Diagnosis not present

## 2017-04-16 DIAGNOSIS — I10 Essential (primary) hypertension: Secondary | ICD-10-CM | POA: Diagnosis not present

## 2017-04-16 DIAGNOSIS — M25562 Pain in left knee: Secondary | ICD-10-CM | POA: Diagnosis not present

## 2017-04-16 DIAGNOSIS — J9811 Atelectasis: Secondary | ICD-10-CM | POA: Diagnosis not present

## 2017-04-16 DIAGNOSIS — M25561 Pain in right knee: Secondary | ICD-10-CM | POA: Diagnosis not present

## 2017-04-16 DIAGNOSIS — E876 Hypokalemia: Secondary | ICD-10-CM | POA: Diagnosis not present

## 2017-04-16 DIAGNOSIS — T502X5A Adverse effect of carbonic-anhydrase inhibitors, benzothiadiazides and other diuretics, initial encounter: Secondary | ICD-10-CM | POA: Diagnosis present

## 2017-04-16 DIAGNOSIS — E782 Mixed hyperlipidemia: Secondary | ICD-10-CM | POA: Diagnosis present

## 2017-04-16 DIAGNOSIS — E785 Hyperlipidemia, unspecified: Secondary | ICD-10-CM | POA: Diagnosis present

## 2017-04-16 DIAGNOSIS — K219 Gastro-esophageal reflux disease without esophagitis: Secondary | ICD-10-CM | POA: Diagnosis not present

## 2017-04-16 DIAGNOSIS — R404 Transient alteration of awareness: Secondary | ICD-10-CM | POA: Diagnosis not present

## 2017-04-16 DIAGNOSIS — M170 Bilateral primary osteoarthritis of knee: Secondary | ICD-10-CM | POA: Diagnosis not present

## 2017-04-16 HISTORY — DX: Syncope and collapse: R55

## 2017-04-16 LAB — CBC
HEMATOCRIT: 37.8 % (ref 36.0–46.0)
HEMOGLOBIN: 12.6 g/dL (ref 12.0–15.0)
MCH: 29.2 pg (ref 26.0–34.0)
MCHC: 33.3 g/dL (ref 30.0–36.0)
MCV: 87.7 fL (ref 78.0–100.0)
Platelets: 283 10*3/uL (ref 150–400)
RBC: 4.31 MIL/uL (ref 3.87–5.11)
RDW: 13.1 % (ref 11.5–15.5)
WBC: 7.8 10*3/uL (ref 4.0–10.5)

## 2017-04-16 LAB — BASIC METABOLIC PANEL
Anion gap: 17 — ABNORMAL HIGH (ref 5–15)
BUN: 15 mg/dL (ref 6–20)
CALCIUM: 9.4 mg/dL (ref 8.9–10.3)
CHLORIDE: 99 mmol/L — AB (ref 101–111)
CO2: 17 mmol/L — AB (ref 22–32)
Creatinine, Ser: 1.02 mg/dL — ABNORMAL HIGH (ref 0.44–1.00)
GFR calc non Af Amer: 54 mL/min — ABNORMAL LOW (ref >60)
GLUCOSE: 147 mg/dL — AB (ref 65–99)
POTASSIUM: 3 mmol/L — AB (ref 3.5–5.1)
Sodium: 133 mmol/L — ABNORMAL LOW (ref 135–145)

## 2017-04-16 LAB — URINALYSIS, ROUTINE W REFLEX MICROSCOPIC
Bilirubin Urine: NEGATIVE
Glucose, UA: NEGATIVE mg/dL
HGB URINE DIPSTICK: NEGATIVE
Ketones, ur: 20 mg/dL — AB
Leukocytes, UA: NEGATIVE
NITRITE: NEGATIVE
PH: 7 (ref 5.0–8.0)
Protein, ur: NEGATIVE mg/dL
SPECIFIC GRAVITY, URINE: 1.011 (ref 1.005–1.030)

## 2017-04-16 LAB — CBG MONITORING, ED: GLUCOSE-CAPILLARY: 158 mg/dL — AB (ref 65–99)

## 2017-04-16 LAB — I-STAT TROPONIN, ED: Troponin i, poc: 0 ng/mL (ref 0.00–0.08)

## 2017-04-16 LAB — MAGNESIUM: MAGNESIUM: 1.8 mg/dL (ref 1.7–2.4)

## 2017-04-16 LAB — HEPATIC FUNCTION PANEL
ALK PHOS: 81 U/L (ref 38–126)
ALT: 18 U/L (ref 14–54)
AST: 33 U/L (ref 15–41)
Albumin: 3.9 g/dL (ref 3.5–5.0)
Bilirubin, Direct: 0.2 mg/dL (ref 0.1–0.5)
Indirect Bilirubin: 0.5 mg/dL (ref 0.3–0.9)
TOTAL PROTEIN: 6.8 g/dL (ref 6.5–8.1)
Total Bilirubin: 0.7 mg/dL (ref 0.3–1.2)

## 2017-04-16 LAB — CREATININE, SERUM
CREATININE: 0.99 mg/dL (ref 0.44–1.00)
GFR, EST NON AFRICAN AMERICAN: 56 mL/min — AB (ref >60)

## 2017-04-16 MED ORDER — ACETAMINOPHEN 650 MG RE SUPP: 650.0000 mg | Freq: Four times a day (QID) | RECTAL | Status: DC | PRN

## 2017-04-16 MED ORDER — MAGNESIUM SULFATE IN D5W 1-5 GM/100ML-% IV SOLN
1.0000 g | Freq: Once | INTRAVENOUS | Status: AC
Start: 2017-04-16 — End: 2017-04-17
  Administered 2017-04-17: 1 g via INTRAVENOUS
  Filled 2017-04-16: qty 100

## 2017-04-16 MED ORDER — LORAZEPAM 2 MG/ML IJ SOLN: 1.0000 mg | INTRAMUSCULAR | Status: DC | PRN

## 2017-04-16 MED ORDER — HYDRALAZINE HCL 20 MG/ML IJ SOLN
10.0000 mg | INTRAMUSCULAR | Status: DC | PRN
Start: 2017-04-16 — End: 2017-04-17

## 2017-04-16 MED ORDER — SIMVASTATIN 5 MG PO TABS: 10.0000 mg | ORAL_TABLET | Freq: Every day | ORAL | Status: DC

## 2017-04-16 MED ORDER — ACETAMINOPHEN 325 MG PO TABS
650.0000 mg | ORAL_TABLET | Freq: Four times a day (QID) | ORAL | Status: DC | PRN
  Administered 2017-04-17 (×2): 650 mg via ORAL
  Filled 2017-04-16 (×2): qty 2

## 2017-04-16 MED ORDER — SODIUM CHLORIDE 0.9 % IV SOLN
INTRAVENOUS | Status: DC
  Administered 2017-04-17 (×2): via INTRAVENOUS

## 2017-04-16 MED ORDER — POTASSIUM CHLORIDE CRYS ER 20 MEQ PO TBCR
40.0000 meq | EXTENDED_RELEASE_TABLET | Freq: Once | ORAL | Status: AC
  Administered 2017-04-16: 40 meq via ORAL
  Filled 2017-04-16: qty 2

## 2017-04-16 MED ORDER — LORAZEPAM 2 MG/ML IJ SOLN
0.5000 mg | Freq: Once | INTRAMUSCULAR | Status: AC
  Administered 2017-04-16: 0.5 mg via INTRAVENOUS
  Filled 2017-04-16: qty 1

## 2017-04-16 MED ORDER — ENOXAPARIN SODIUM 40 MG/0.4ML ~~LOC~~ SOLN
40.0000 mg | Freq: Every day | SUBCUTANEOUS | Status: DC
  Administered 2017-04-17: 40 mg via SUBCUTANEOUS
  Filled 2017-04-16: qty 0.4

## 2017-04-16 MED ORDER — CITALOPRAM HYDROBROMIDE 10 MG PO TABS
10.0000 mg | ORAL_TABLET | Freq: Every day | ORAL | Status: DC
  Administered 2017-04-17: 10 mg via ORAL
  Filled 2017-04-16: qty 1

## 2017-04-16 MED ORDER — AMITRIPTYLINE HCL 10 MG PO TABS
10.0000 mg | ORAL_TABLET | Freq: Every day | ORAL | Status: DC
  Filled 2017-04-16: qty 1

## 2017-04-16 MED ORDER — SODIUM CHLORIDE 0.9 % IV BOLUS
1000.0000 mL | Freq: Once | INTRAVENOUS | Status: AC
  Administered 2017-04-16: 1000 mL via INTRAVENOUS

## 2017-04-16 NOTE — ED Triage Notes (Signed)
Per EMS pt was at a PT appointment to get a cortisone shot for her right knee.  Immediately after the shot she became nausea and had a syncopal event.  When EMS arrived she vomited x2.  They gave her 4mg  of Zofran and she has not vomited any more. AOx4 NAD.

## 2017-04-16 NOTE — ED Notes (Signed)
Back to CT 

## 2017-04-16 NOTE — ED Notes (Signed)
Transport here for MRI 

## 2017-04-16 NOTE — H&P (Signed)
History and Physical    Diana Hendrix YDX:[id number] DOB: [birth year] DOA: 04/16/2017  PCP: Hoyt Koch, MD  Patient coming from: Home.  Chief Complaint: Possible seizures.  HPI: Diana Hendrix is a 72 y.o. female with history of hypertension, hyperlipidemia, sleep apnea was brought to the ER from orthopedic office after patient had a seizure-like activity.  Patient states she went for knee injection following which patient started developing mild dizziness.  Patient was made to lie flat.  After a few minutes patient stood up and again started feeling dizzy and next thing she remembers is that the physician and PAS were around her.  She does not recollect what happened but was told that she may have had a seizure.  She had incontinence of urine.  Had mild headache which improved.  Did not have any weakness of the upper or lower extremities before or after the episode.  ED Course: In the ER patient was found to be having persistent nausea vomiting for which patient was given Ativan and following which patient's nausea vomiting improved.  Mildly hypotensive which improved with fluids.  EKG shows normal sinus rhythm with prolonged QT interval.  CT head was unremarkable.  ER physician discussed with on-call neurologist who advised patient to be admitted for observation and get MRI and EEG.  Patient states she had recently taken coenzyme Q and turmeric otherwise no new medications.  She has been taking these medications for last 1 week.  She did take some chondroitin sulfate last month which she discontinued last week because of diarrhea.  Review of Systems: As per HPI, rest all negative.   Past Medical History:  Diagnosis Date  . GERD (gastroesophageal reflux disease)   . Hyperlipidemia   . Hypertension   . Macular edema, cystoid   . OSA (obstructive sleep apnea) 09/17/2012    Past Surgical History:  Procedure Laterality Date  . CESAREAN SECTION    . MENISCUS REPAIR Right  09/13/2016  . SHOULDER SURGERY    . TUBAL LIGATION       reports that she has never smoked. She has never used smokeless tobacco. She reports that she does not drink alcohol or use drugs.  Allergies  Allergen Reactions  . Glucosamine Forte [Nutritional Supplements] Nausea And Vomiting    Family History  Problem Relation Age of Onset  . Hypertension Mother   . Atrial fibrillation Mother   . Diabetes Mother     Prior to Admission medications   Medication Sig Start Date End Date Taking? Authorizing Provider  amitriptyline (ELAVIL) 10 MG tablet TAKE 2 TABLETS(20 MG) BY MOUTH AT BEDTIME 04/02/17  Yes Hoyt Koch, MD  amLODipine (NORVASC) 5 MG tablet Take 5 mg by mouth every evening.    Yes [provider]  Cholecalciferol (VITAMIN D-3) 1000 UNITS CAPS Take 1 capsule by mouth daily.    Yes [provider]  citalopram (CELEXA) 10 MG tablet TAKE 1 TABLET(10 MG) BY MOUTH DAILY 04/02/17  Yes Hoyt Koch, MD  Coenzyme Q10 (COQ10 PO) Take 1 tablet by mouth every morning.   Yes [provider]  fluticasone (FLONASE) 50 MCG/ACT nasal spray Place 2 sprays into both nostrils daily. Patient taking differently: Place 2 sprays into both nostrils daily as needed for allergies.  08/25/16  Yes Chesley Mires, MD  metoprolol succinate (TOPROL-XL) 50 MG 24 hr tablet Take 75 mg by mouth daily. Take with or immediately following a meal.    Yes [provider]  olmesartan-hydrochlorothiazide (BENICAR HCT) 40-12.5 MG per tablet Take 1 tablet by mouth daily.   Yes [provider]  pantoprazole (PROTONIX) 40 MG tablet TAKE 1 TABLET(40 MG) BY MOUTH DAILY 03/19/17  Yes Hoyt Koch, MD  Polyethyl Glycol-Propyl Glycol (SYSTANE OP) Place 1 drop into both eyes 4 (four) times daily.   Yes [provider]  polyethylene glycol powder (GLYCOLAX/MIRALAX) powder MIX 1 CAPFUL IN 8 OUNCES OF WATER AND DRINK ONCE A DAY 09/18/14  Yes Hoyt Koch,  MD  simvastatin (ZOCOR) 10 MG tablet TAKE 1 TABLET(10 MG) BY MOUTH AT BEDTIME 03/05/17  Yes Hoyt Koch, MD  TURMERIC PO Take 500 mg by mouth daily.   Yes [provider]    Physical Exam: Vitals:   04/16/17 2030 04/16/17 2045 04/16/17 2100 04/16/17 2115  BP: 109/71 107/72 93/79 102/81  Pulse: 96 98 (!) 101 (!) 104  Resp: 17 18 (!) 22 (!) 23  Temp:      TempSrc:      SpO2: 97% 98% 97% 95%  Weight:      Height:          Constitutional: Moderately built and nourished. Vitals:   04/16/17 2030 04/16/17 2045 04/16/17 2100 04/16/17 2115  BP: 109/71 107/72 93/79 102/81  Pulse: 96 98 (!) 101 (!) 104  Resp: 17 18 (!) 22 (!) 23  Temp:      TempSrc:      SpO2: 97% 98% 97% 95%  Weight:      Height:       Eyes: Anicteric no pallor. ENMT: No discharge from the ears eyes nose or mouth. Neck: No mass felt.  No neck rigidity.  No JVD appreciated. Respiratory: No rhonchi no crepitations. Cardiovascular: S1-S2 heard no murmurs appreciated. Abdomen: Soft nontender bowel sounds present. Musculoskeletal: No edema.  No joint effusion. Skin: No rash.  Skin appears warm. Neurologic: Alert awake oriented to time place and person.  Moves all extremities 5 x 5.  No facial asymmetry.  Tongue is midline. Psychiatric: Appears normal.  Normal affect.   Labs on Admission: I have personally reviewed following labs and imaging studies  CBC: Recent Labs  Lab 04/16/17 1623  WBC 7.8  HGB 12.6  HCT 37.8  MCV 87.7  PLT 983   Basic Metabolic Panel: Recent Labs  Lab 04/16/17 1623 04/16/17 1735  NA 133*  --   K 3.0*  --   CL 99*  --   CO2 17*  --   GLUCOSE 147*  --   BUN 15  --   CREATININE 1.02*  --   CALCIUM 9.4  --   MG  --  1.8   GFR: Estimated Creatinine Clearance: 54.8 mL/min (A) (by C-G formula based on SCr of 1.02 mg/dL (H)). Liver Function Tests: Recent Labs  Lab 04/16/17 1735  AST 33  ALT 18  ALKPHOS 81  BILITOT 0.7  PROT 6.8  ALBUMIN 3.9   No  results for input(s): LIPASE, AMYLASE in the last 168 hours. No results for input(s): AMMONIA in the last 168 hours. Coagulation Profile: No results for input(s): INR, PROTIME in the last 168 hours. Cardiac Enzymes: No results for input(s): CKTOTAL, CKMB, CKMBINDEX, TROPONINI in the last 168 hours. BNP (last 3 results) No results for input(s): PROBNP in the last 8760 hours. HbA1C: No results for input(s): HGBA1C in the last 72 hours. CBG: Recent Labs  Lab 04/16/17 1630  GLUCAP 158*   Lipid Profile: No results for input(s): CHOL,  HDL, LDLCALC, TRIG, CHOLHDL, LDLDIRECT in the last 72 hours. Thyroid Function Tests: No results for input(s): TSH, T4TOTAL, FREET4, T3FREE, THYROIDAB in the last 72 hours. Anemia Panel: No results for input(s): VITAMINB12, FOLATE, FERRITIN, TIBC, IRON, RETICCTPCT in the last 72 hours. Urine analysis:    Component Value Date/Time   COLORURINE STRAW (A) 04/16/2017 2040   APPEARANCEUR CLEAR 04/16/2017 2040   LABSPEC 1.011 04/16/2017 2040   PHURINE 7.0 04/16/2017 2040   GLUCOSEU NEGATIVE 04/16/2017 2040   HGBUR NEGATIVE 04/16/2017 2040   BILIRUBINUR NEGATIVE 04/16/2017 2040   KETONESUR 20 (A) 04/16/2017 2040   PROTEINUR NEGATIVE 04/16/2017 2040   NITRITE NEGATIVE 04/16/2017 2040   LEUKOCYTESUR NEGATIVE 04/16/2017 2040   Sepsis Labs: @LABRCNTIP (procalcitonin:4,lacticidven:4) )No results found for this or any previous visit (from the past 240 hour(s)).   Radiological Exams on Admission: Dg Chest 2 View  Result Date: 04/16/2017 CLINICAL DATA:  Syncope EXAM: CHEST - 2 VIEW COMPARISON:  None. FINDINGS: Normal heart size. Low lung volumes with bibasilar atelectasis. No pneumothorax. No pleural effusion. IMPRESSION: Bibasilar atelectasis. Electronically Signed   By: Marybelle Killings M.D.   On: 04/16/2017 18:39   Ct Head Wo Contrast  Result Date: 04/16/2017 CLINICAL DATA:  Vertigo EXAM: CT HEAD WITHOUT CONTRAST TECHNIQUE: Contiguous axial images were obtained  from the base of the skull through the vertex without intravenous contrast. COMPARISON:  None. FINDINGS: Brain: There is no mass effect, midline shift, or acute intracranial hemorrhage. Global atrophy is noted. Mild chronic ischemic changes in the periventricular white matter. Vascular: No hyperdense vessel or unexpected calcification. Skull: Cranium is intact. Sinuses/Orbits: Mastoid air cells are clear. Visualized paranasal sinuses are clear. Orbits are within normal limits. Other: Noncontributory. IMPRESSION: No acute intracranial pathology. Electronically Signed   By: Marybelle Killings M.D.   On: 04/16/2017 19:09    EKG: Independently reviewed.  Normal sinus rhythm with QTC of 521 ms.  Assessment/Plan Principal Problem:   Convulsion ([place]) Active Problems:   OSA (obstructive sleep apnea)   Hypertension   Hyperlipidemia    1. Possible seizures -will check MRI brain EEG.  Closely monitor in telemetry.  Being the first episode no antibiotics started.  We will also check 2D echo. 2. Prolonged QT interval -replace potassium check magnesium levels.  Avoid QT prolonging medications. 3. Hypertension -mildly hypotensive on presentation.  Holding antihypertensives and keeping patient on PRN IV hydralazine for now.  Closely follow blood pressure trends.  Restart antihypertensives at the lower dose. 4. Hypokalemia likely from diuretics and also recent diarrhea.  Replace potassium and recheck. 5. Hyperlipidemia on statins. 6. Sleep apnea on CPAP.   DVT prophylaxis: Lovenox. Code Status: Full code. Family Communication: Discussed with patient. Disposition Plan: Home. Consults called: ER physician discussed with neurologist. Admission status: Observation.   Rise Patience MD [facility] Pager [phone].  If 7PM-7AM, please contact night-coverage [email or web address] Password [email or web address]  04/16/2017, 10:14 PM

## 2017-04-16 NOTE — ED Provider Notes (Signed)
[facility] EMERGENCY DEPARTMENT Provider Note   CSN: [id number] Arrival date & time: 04/16/17  1612     History   Chief Complaint Chief Complaint  Patient presents with  . Loss of Consciousness    HPI Diana Hendrix is a 72 y.o. female.  HPI   Diana Hendrix is a 71 y.o. female, with a history of GERD, HTN, and hyperlipidemia, presenting to the ED with loss of consciousness. Was at an office visit with Jacobo Forest, PA-C with [facility] to receive bilateral knee injections around 2pm when she suddenly lost consciousness.  She was injected with 8 mL of 1% lidocaine (80 total mg at 10mg /mL) and 12 mg of Celestone.  In his note, Mr. Clance Boll states patient tolerated the injection well, but then stated she felt as if she was going to pass out.  She lost consciousness, became "very spastic," and was incontinent of urine. Unknown seizure time, unknown postictal period. Patient states she has received injections in her knees previously without adverse effect.  Per EMS, she vomited twice.  She received 4 mg Zofran in route to the hospital.  Patient continues to feel nauseous as well as dizzy/lightheaded.  States she feels "numb all over" and "very fatigued."   Denies headache, fever, diarrhea, unilateral weakness, vision deficit, chest pain, shortness of breath, abdominal pain, or any other complaints.   Past Medical History:  Diagnosis Date  . GERD (gastroesophageal reflux disease)   . Hyperlipidemia   . Hypertension   . Macular edema, cystoid   . OSA (obstructive sleep apnea) 09/17/2012    Patient Active Problem List   Diagnosis Date Noted  . Convulsion ([place]) 04/16/2017  . Impaired fasting blood sugar 10/11/2016  . Routine general medical examination at a health care facility 03/18/2015  . Hyperlipidemia 09/17/2013  . Hemorrhoid 09/17/2013  . Obesity 09/17/2013  . OSA (obstructive sleep apnea) 09/17/2012  . Palpitations 09/17/2012  .  GERD (gastroesophageal reflux disease) 09/17/2012  . Insomnia 09/17/2012  . Hypertension 09/17/2012    Past Surgical History:  Procedure Laterality Date  . CESAREAN SECTION    . MENISCUS REPAIR Right 09/13/2016  . SHOULDER SURGERY    . TUBAL LIGATION       OB History   None      Home Medications    Prior to Admission medications   Medication Sig Start Date End Date Taking? Authorizing Provider  amitriptyline (ELAVIL) 10 MG tablet TAKE 2 TABLETS(20 MG) BY MOUTH AT BEDTIME 04/02/17  Yes Hoyt Koch, MD  amLODipine (NORVASC) 5 MG tablet Take 5 mg by mouth every evening.    Yes [provider]  Cholecalciferol (VITAMIN D-3) 1000 UNITS CAPS Take 1 capsule by mouth daily.    Yes [provider]  citalopram (CELEXA) 10 MG tablet TAKE 1 TABLET(10 MG) BY MOUTH DAILY 04/02/17  Yes Hoyt Koch, MD  Coenzyme Q10 (COQ10 PO) Take 1 tablet by mouth every morning.   Yes [provider]  fluticasone (FLONASE) 50 MCG/ACT nasal spray Place 2 sprays into both nostrils daily. Patient taking differently: Place 2 sprays into both nostrils daily as needed for allergies.  08/25/16  Yes Chesley Mires, MD  metoprolol succinate (TOPROL-XL) 50 MG 24 hr tablet Take 75 mg by mouth daily. Take with or immediately following a meal.    Yes [provider]  olmesartan-hydrochlorothiazide (BENICAR HCT) 40-12.5 MG per tablet Take 1 tablet by mouth daily.   Yes [provider]  pantoprazole (PROTONIX) 40 MG tablet TAKE 1 TABLET(40 MG) BY MOUTH DAILY 03/19/17  Yes Hoyt Koch, MD  Polyethyl Glycol-Propyl Glycol (SYSTANE OP) Place 1 drop into both eyes 4 (four) times daily.   Yes [provider]  polyethylene glycol powder (GLYCOLAX/MIRALAX) powder MIX 1 CAPFUL IN 8 OUNCES OF WATER AND DRINK ONCE A DAY 09/18/14  Yes Hoyt Koch, MD  simvastatin (ZOCOR) 10 MG tablet TAKE 1 TABLET(10 MG) BY MOUTH AT BEDTIME 03/05/17  Yes Hoyt Koch, MD  TURMERIC PO Take 500 mg by mouth daily.   Yes [provider]    Family History Family History  Problem Relation Age of Onset  . Hypertension Mother   . Atrial fibrillation Mother   . Diabetes Mother     Social History Social History   Tobacco Use  . Smoking status: Never Smoker  . Smokeless tobacco: Never Used  Substance Use Topics  . Alcohol use: No  . Drug use: No     Allergies   Glucosamine forte [nutritional supplements]   Review of Systems Review of Systems  Constitutional: Positive for diaphoresis. Negative for chills and fever.  Respiratory: Negative for cough and shortness of breath.   Cardiovascular: Negative for chest pain.  Gastrointestinal: Positive for nausea and vomiting. Negative for abdominal pain, blood in stool and diarrhea.  Musculoskeletal: Negative for back pain and neck pain.  Neurological: Positive for dizziness, seizures, syncope and numbness. Negative for headaches.  All other systems reviewed and are negative.    Physical Exam Updated Vital Signs BP 120/68 (BP Location: Right Arm)   Pulse 78   Temp (!) 96.1 F (35.6 C) (Axillary)   Resp 20   Ht 5\' 3"  (1.6 m)   Wt 90.7 kg (200 lb)   SpO2 100%   BMI 35.43 kg/m   Physical Exam  Constitutional: She appears well-developed and well-nourished. She appears distressed.  HENT:  Head: Normocephalic and atraumatic.  Eyes: Conjunctivae are normal.  Neck: Neck supple.  Cardiovascular: Normal rate, regular rhythm, normal heart sounds and intact distal pulses.  Pulmonary/Chest: Effort normal and breath sounds normal. No respiratory distress.  Abdominal: Soft. There is no tenderness. There is no guarding.  Musculoskeletal: She exhibits no edema.  Lymphadenopathy:    She has no cervical adenopathy.  Neurological: She is alert.  Patient is somewhat disoriented and resistant to answering questions stating that she "just feels too bad." Lip smacking. No noted facial  droop.  After patient began to feel better, I performed the following exam: No sensory deficits.  No noted speech deficits. No aphasia. Patient handles oral secretions without difficulty. No noted swallowing defects.  Equal grip strength bilaterally. Strength 5/5 in the upper extremities. Strength 5/5 in the ankles bilaterally.  Patient was unable to sit up on her own to assess her lower extremity strength and reflexes. She was able to hold herself up by grabbing the rails, but then needed to lay back down because she started to feel nauseous again. Coordination intact including finger to nose.  Cranial nerves III-XII grossly intact.  No facial droop.   Skin: She is diaphoretic. There is pallor.  Patient is cool, clammy, and diaphoretic.  Psychiatric: She has a normal mood and affect. Her behavior is normal.  Nursing note and vitals reviewed.    ED Treatments / Results  Labs (all labs ordered are listed, but only abnormal results are displayed) Labs Reviewed  BASIC METABOLIC PANEL - Abnormal; Notable for the  following components:      Result Value   Sodium 133 (*)    Potassium 3.0 (*)    Chloride 99 (*)    CO2 17 (*)    Glucose, Bld 147 (*)    Creatinine, Ser 1.02 (*)    GFR calc non Af Amer 54 (*)    Anion gap 17 (*)    All other components within normal limits  URINALYSIS, ROUTINE W REFLEX MICROSCOPIC - Abnormal; Notable for the following components:   Color, Urine STRAW (*)    Ketones, ur 20 (*)    All other components within normal limits  CBG MONITORING, ED - Abnormal; Notable for the following components:   Glucose-Capillary 158 (*)    All other components within normal limits  CBC  HEPATIC FUNCTION PANEL  MAGNESIUM  I-STAT TROPONIN, ED    EKG EKG Interpretation  Date/Time:  Monday April 16 2017 19:27:37 EDT Ventricular Rate:  88 PR Interval:    QRS Duration: 110 QT Interval:  430 QTC Calculation: 521 R Axis:   61 Text Interpretation:  Sinus rhythm  Prolonged PR interval Abnormal R-wave progression, early transition Borderline repolarization abnormality Prolonged QT interval Confirmed by Aletta Edouard [phone]) on 04/16/2017 7:37:21 PM   EKG Interpretation  Date/Time:  Monday April 16 2017 16:22:56 EDT Ventricular Rate:  79 PR Interval:    QRS Duration: 103 QT Interval:  441 QTC Calculation: 506 R Axis:   61 Text Interpretation:  Sinus rhythm Abnormal R-wave progression, early transition Nonspecific repol abnormality, diffuse leads Prolonged QT interval no prior to compare with Confirmed by Aletta Edouard [phone]) on 04/16/2017 4:46:58 PM        Radiology Dg Chest 2 View  Result Date: 04/16/2017 CLINICAL DATA:  Syncope EXAM: CHEST - 2 VIEW COMPARISON:  None. FINDINGS: Normal heart size. Low lung volumes with bibasilar atelectasis. No pneumothorax. No pleural effusion. IMPRESSION: Bibasilar atelectasis. Electronically Signed   By: Marybelle Killings M.D.   On: 04/16/2017 18:39   Ct Head Wo Contrast  Result Date: 04/16/2017 CLINICAL DATA:  Vertigo EXAM: CT HEAD WITHOUT CONTRAST TECHNIQUE: Contiguous axial images were obtained from the base of the skull through the vertex without intravenous contrast. COMPARISON:  None. FINDINGS: Brain: There is no mass effect, midline shift, or acute intracranial hemorrhage. Global atrophy is noted. Mild chronic ischemic changes in the periventricular white matter. Vascular: No hyperdense vessel or unexpected calcification. Skull: Cranium is intact. Sinuses/Orbits: Mastoid air cells are clear. Visualized paranasal sinuses are clear. Orbits are within normal limits. Other: Noncontributory. IMPRESSION: No acute intracranial pathology. Electronically Signed   By: Marybelle Killings M.D.   On: 04/16/2017 19:09    Procedures Procedures (including critical care time)  Medications Ordered in ED Medications  magnesium sulfate IVPB 1 g 100 mL (has no administration in time range)  sodium chloride 0.9 % bolus 1,000 mL (1,000  mLs Intravenous New Bag/Given 04/16/17 1738)  LORazepam (ATIVAN) injection 0.5 mg (0.5 mg Intravenous Given 04/16/17 1738)  potassium chloride SA (K-DUR,KLOR-CON) CR tablet 40 mEq (40 mEq Oral Given 04/16/17 1925)     Initial Impression / Assessment and Plan / ED Course  I have reviewed the triage vital signs and the nursing notes.  Pertinent labs & imaging results that were available during my care of the patient were reviewed by me and considered in my medical decision making (see chart for details).  Clinical Course as of Apr 16 2200  Mon Apr 16, 2017  1742 Patient presents after  receiving an intra-articular injection in her right knee for arthritis.  She is acutely complaining of nausea vomiting feeling tingling in her face and extremities.  Also complaining of excessive thirst.  Felt like she had a syncopal event or possible seizure with some motor activity.  Her vitals are stable but she looks very uncomfortable.  She has had these injections before and never caused any kind of event like this.  Injection involved lidocaine and steroid I believe.  On EKG she has a prolonged QTC.  Rest of her labs are coming back and so far unremarkable.  She is getting some fluids and some Ativan for nausea.  We will try to be careful of increasing her QTC at all.   [MB]  3016 Will review with poison control.   [MB]  0109 Spoke with Oris Drone from poison control. States she will review with toxicologist.    [SJ]  1755 Spoke with Nicole Kindred, pharmacist.    [SJ]  [phone] Patient reevaluated.  States she feels somewhat better.  Nausea and overall feeling of illness have improved.  She has return of nausea with sitting up in the bed and states she needs to lay back.  Feeling of numbness in extremities has improved.  Denies dizziness, chest pain, shortness of breath, or any other complaints.   [SJ]  5732 Bernice from poison control called back.  States she talked to the toxicologist, Dr. Brigitte Pulse, who states he thinks it is  unlikely that the patient is having systemic lidocaine toxicity.  Recommend looking for alternate sources for her symptoms.    [SJ]  2046 Spoke with Dr. Rory Percy, neurology. States patient can be admitted via hospitalist for new onset seizure work up including MRI and EEG. No anticonvulsant at this time.   [SJ]  2100 Patient continues to feel better. No seizure activity thus far in ED course.   [SJ]  2118 Spoke with Dr. Hal Hope, hospitalist. Agrees to admit the patient.   [SJ]    Clinical Course User Index [MB] Hayden Rasmussen, MD [SJ] Lorayne Bender, PA-C    Patient presents following an episode of syncope versus seizure.  She improved during ED course.  Hypokalemia noted and addressed.  Hyponatremia, hypochloremia, and decreased CO2 are thought to be due to dehydration and were addressed with IV fluids.  She will be admitted for new onset seizure workup.   Findings and plan of care discussed with Aletta Edouard, MD. Dr. Melina Copa personally evaluated and examined this patient.  Vitals:   04/16/17 2030 04/16/17 2045 04/16/17 2100 04/16/17 2115  BP: 109/71 107/72 93/79 102/81  Pulse: 96 98 (!) 101 (!) 104  Resp: 17 18 (!) 22 (!) 23  Temp:      TempSrc:      SpO2: 97% 98% 97% 95%  Weight:      Height:        Orthostatic VS for the past 24 hrs:  BP- Lying Pulse- Lying BP- Sitting Pulse- Sitting BP- Standing at 0 minutes Pulse- Standing at 0 minutes  04/16/17 1931 112/69 90 116/75 89 121/67 75     Final Clinical Impressions(s) / ED Diagnoses   Final diagnoses:  Syncope and collapse  Prolonged QT interval    ED Discharge Orders    None       Layla Maw 04/16/17 2204    Hayden Rasmussen, MD 04/17/17 1218

## 2017-04-16 NOTE — ED Notes (Signed)
Admitting at bedside 

## 2017-04-17 ENCOUNTER — Observation Stay (HOSPITAL_COMMUNITY): Payer: Medicare Other

## 2017-04-17 DIAGNOSIS — E782 Mixed hyperlipidemia: Secondary | ICD-10-CM | POA: Diagnosis not present

## 2017-04-17 DIAGNOSIS — R55 Syncope and collapse: Secondary | ICD-10-CM | POA: Diagnosis not present

## 2017-04-17 DIAGNOSIS — R569 Unspecified convulsions: Principal | ICD-10-CM

## 2017-04-17 DIAGNOSIS — T502X5A Adverse effect of carbonic-anhydrase inhibitors, benzothiadiazides and other diuretics, initial encounter: Secondary | ICD-10-CM | POA: Diagnosis present

## 2017-04-17 DIAGNOSIS — I10 Essential (primary) hypertension: Secondary | ICD-10-CM | POA: Diagnosis not present

## 2017-04-17 DIAGNOSIS — Z9989 Dependence on other enabling machines and devices: Secondary | ICD-10-CM | POA: Diagnosis not present

## 2017-04-17 DIAGNOSIS — Z79899 Other long term (current) drug therapy: Secondary | ICD-10-CM | POA: Diagnosis not present

## 2017-04-17 DIAGNOSIS — R32 Unspecified urinary incontinence: Secondary | ICD-10-CM | POA: Diagnosis present

## 2017-04-17 DIAGNOSIS — K219 Gastro-esophageal reflux disease without esophagitis: Secondary | ICD-10-CM | POA: Diagnosis present

## 2017-04-17 DIAGNOSIS — E876 Hypokalemia: Secondary | ICD-10-CM | POA: Diagnosis present

## 2017-04-17 DIAGNOSIS — I4581 Long QT syndrome: Secondary | ICD-10-CM | POA: Diagnosis present

## 2017-04-17 DIAGNOSIS — G4733 Obstructive sleep apnea (adult) (pediatric): Secondary | ICD-10-CM | POA: Diagnosis not present

## 2017-04-17 DIAGNOSIS — Z91018 Allergy to other foods: Secondary | ICD-10-CM | POA: Diagnosis not present

## 2017-04-17 DIAGNOSIS — E785 Hyperlipidemia, unspecified: Secondary | ICD-10-CM | POA: Diagnosis present

## 2017-04-17 LAB — CBC
HEMATOCRIT: 36.7 % (ref 36.0–46.0)
Hemoglobin: 12.7 g/dL (ref 12.0–15.0)
MCH: 30.5 pg (ref 26.0–34.0)
MCHC: 34.6 g/dL (ref 30.0–36.0)
MCV: 88.2 fL (ref 78.0–100.0)
Platelets: 286 10*3/uL (ref 150–400)
RBC: 4.16 MIL/uL (ref 3.87–5.11)
RDW: 13.6 % (ref 11.5–15.5)
WBC: 8.3 10*3/uL (ref 4.0–10.5)

## 2017-04-17 LAB — BASIC METABOLIC PANEL
Anion gap: 12 (ref 5–15)
BUN: 10 mg/dL (ref 6–20)
CALCIUM: 8.8 mg/dL — AB (ref 8.9–10.3)
CO2: 20 mmol/L — AB (ref 22–32)
Chloride: 102 mmol/L (ref 101–111)
Creatinine, Ser: 0.9 mg/dL (ref 0.44–1.00)
GFR calc Af Amer: >60 mL/min (ref >60)
GLUCOSE: 156 mg/dL — AB (ref 65–99)
POTASSIUM: 3.7 mmol/L (ref 3.5–5.1)
Sodium: 134 mmol/L — ABNORMAL LOW (ref 135–145)

## 2017-04-17 LAB — MAGNESIUM: Magnesium: 2.1 mg/dL (ref 1.7–2.4)

## 2017-04-17 MED ORDER — POTASSIUM CHLORIDE CRYS ER 20 MEQ PO TBCR: 20.0000 meq | EXTENDED_RELEASE_TABLET | Freq: Once | ORAL | Status: DC

## 2017-04-17 NOTE — Procedures (Signed)
ELECTROENCEPHALOGRAM REPORT  Date of Study: 04/17/2017  Patient's Name: Diana Hendrix MRN: [id number] Date of Birth: 05/13/1945  Referring Provider: Dr. Gean Birchwood  Clinical History: This is a 72 year old woman with loss of consciousness  Medications: Elavil Celexa  Technical Summary: A multichannel digital EEG recording measured by the international 10-20 system with electrodes applied with paste and impedances below 5000 ohms performed in our laboratory with EKG monitoring in an awake and drowsy patient.  Hyperventilation and photic stimulation were performed.  The digital EEG was referentially recorded, reformatted, and digitally filtered in a variety of bipolar and referential montages for optimal display.    Description: The patient is awake and drowsy during the recording.  During maximal wakefulness, there is a symmetric, medium voltage 9 Hz posterior dominant rhythm that attenuates with eye opening.  The record is symmetric.  During drowsiness, there is an increase in theta slowing of the background, at times with shifting asymmetry over the bilateral temporal regions. Deeper stages of sleep were not seen. Hyperventilation and photic stimulation did not elicit any abnormalities.  There were no epileptiform discharges or electrographic seizures seen.    EKG lead was unremarkable.  Impression: This awake and drowsy EEG is normal.    Clinical Correlation: A normal EEG does not exclude a clinical diagnosis of epilepsy.  Clinical correlation is advised.   Ellouise Newer, M.D.

## 2017-04-17 NOTE — Discharge Summary (Signed)
Physician Discharge Summary  Diana Hendrix QMV:[id number] DOB: 08-05-1945 DOA: 04/16/2017  PCP: Hoyt Koch, MD  Admit date: 04/16/2017 Discharge date: 04/17/2017  Time spent: 32 minutes  Recommendations for Outpatient Follow-up:  1. Follow-up with PCP    Discharge Diagnoses:  Principal Problem:   Convulsion ([place]) Active Problems:   OSA (obstructive sleep apnea)   Hypertension   Hyperlipidemia   Discharge Condition: good  Diet recommendation: heart healthy  Filed Weights   04/16/17 1622 04/16/17 2355  Weight: 90.7 kg (200 lb) 93.4 kg (205 lb 14.6 oz)    History of present illness:  Patient is a 72 year old female that came from orthopedics office after she went in there for knee injection but developed dizziness and passed out.  She was noted to have been having tonic-clonic seizures.  She was incontinent of urine.  In the ER she was still having nausea was vomiting which responded to Ativan.  Patient was mildly hypotensive but also has EKG showing QT interval prolongation.  Head CT was negative but patient was admitted for observation and evaluation of seizure  Hospital Course:  Patient was admitted and MRI was done.  EEG also don't that were both normal.  She was mildly orthostatic.  Normal seizures in the hospital.  She was not treated with any antiseizure medication as this is her first episode and the only episode.  She did very well this point being discharged home to follow up with PCP.  Suspected possible vagal syncopal episode or medication-induced seizure  Procedures:  MRI of the brain  EEG   Consultations:  None  Discharge Exam: Vitals:   04/17/17 0835 04/17/17 1229  BP: 131/67 98/67  Pulse: 93 89  Resp: 18 18  Temp: 97.7 F (36.5 C) 97.8 F (36.6 C)  SpO2: 99% 98%    General: Stable, NAD Cardiovascular: RRR Respiratory: Good AE bilateraly  Discharge Instructions   Discharge Instructions    Diet - low sodium heart healthy   Complete  by:  As directed    Increase activity slowly   Complete by:  As directed      Allergies as of 04/17/2017      Reactions   Glucosamine Forte [nutritional Supplements] Nausea And Vomiting      Medication List    TAKE these medications   amitriptyline 10 MG tablet Commonly known as:  ELAVIL TAKE 2 TABLETS(20 MG) BY MOUTH AT BEDTIME   amLODipine 5 MG tablet Commonly known as:  NORVASC Take 5 mg by mouth every evening.   citalopram 10 MG tablet Commonly known as:  CELEXA TAKE 1 TABLET(10 MG) BY MOUTH DAILY   COQ10 PO Take 1 tablet by mouth every morning.   fluticasone 50 MCG/ACT nasal spray Commonly known as:  FLONASE Place 2 sprays into both nostrils daily. What changed:    when to take this  reasons to take this   metoprolol succinate 50 MG 24 hr tablet Commonly known as:  TOPROL-XL Take 75 mg by mouth daily. Take with or immediately following a meal.   olmesartan-hydrochlorothiazide 40-12.5 MG tablet Commonly known as:  BENICAR HCT Take 1 tablet by mouth daily.   pantoprazole 40 MG tablet Commonly known as:  PROTONIX TAKE 1 TABLET(40 MG) BY MOUTH DAILY   polyethylene glycol powder powder Commonly known as:  GLYCOLAX/MIRALAX MIX 1 CAPFUL IN 8 OUNCES OF WATER AND DRINK ONCE A DAY   simvastatin 10 MG tablet Commonly known as:  ZOCOR TAKE 1 TABLET(10 MG) BY MOUTH  AT BEDTIME   SYSTANE OP Place 1 drop into both eyes 4 (four) times daily.   TURMERIC PO Take 500 mg by mouth daily.   Vitamin D-3 1000 units Caps Take 1 capsule by mouth daily.      Allergies  Allergen Reactions  . Glucosamine Forte [Nutritional Supplements] Nausea And Vomiting      The results of significant diagnostics from this hospitalization (including imaging, microbiology, ancillary and laboratory) are listed below for reference.    Significant Diagnostic Studies: Dg Chest 2 View  Result Date: 04/16/2017 CLINICAL DATA:  Syncope EXAM: CHEST - 2 VIEW COMPARISON:  None. FINDINGS:  Normal heart size. Low lung volumes with bibasilar atelectasis. No pneumothorax. No pleural effusion. IMPRESSION: Bibasilar atelectasis. Electronically Signed   By: Marybelle Killings M.D.   On: 04/16/2017 18:39   Ct Head Wo Contrast  Result Date: 04/16/2017 CLINICAL DATA:  Vertigo EXAM: CT HEAD WITHOUT CONTRAST TECHNIQUE: Contiguous axial images were obtained from the base of the skull through the vertex without intravenous contrast. COMPARISON:  None. FINDINGS: Brain: There is no mass effect, midline shift, or acute intracranial hemorrhage. Global atrophy is noted. Mild chronic ischemic changes in the periventricular white matter. Vascular: No hyperdense vessel or unexpected calcification. Skull: Cranium is intact. Sinuses/Orbits: Mastoid air cells are clear. Visualized paranasal sinuses are clear. Orbits are within normal limits. Other: Noncontributory. IMPRESSION: No acute intracranial pathology. Electronically Signed   By: Marybelle Killings M.D.   On: 04/16/2017 19:09   Mr Brain Wo Contrast  Result Date: 04/16/2017 CLINICAL DATA:  Syncopal episode at Sports Medicine appointment. Possible seizure. History of hypertension and hyperlipidemia. EXAM: MRI HEAD WITHOUT CONTRAST TECHNIQUE: Multiplanar, multiecho pulse sequences of the brain and surrounding structures were obtained without intravenous contrast. COMPARISON:  Insert young CT HEAD CT HEAD April 16, 2017 FINDINGS: INTRACRANIAL CONTENTS: No reduced diffusion to suggest acute ischemia. No susceptibility artifact to suggest hemorrhage. The ventricles and sulci are normal for patient's age. Scattered subcentimeter supratentorial white matter FLAIR T2 hyperintensities compatible with mild chronic small vessel ischemic disease, less than expected for age. No suspicious parenchymal signal, masses, mass effect. No abnormal extra-axial fluid collections. No extra-axial masses. Symmetric size, morphology and signal of the hippocampi. VASCULAR: Normal major intracranial  vascular flow voids present at skull base. SKULL AND UPPER CERVICAL SPINE: No abnormal sellar expansion. No suspicious calvarial bone marrow signal. Craniocervical junction maintained. SINUSES/ORBITS: The mastoid air-cells and included paranasal sinuses are well-aerated.The included ocular globes and orbital contents are non-suspicious. Status post bilateral ocular lens implants. OTHER: None. IMPRESSION: Normal noncontrast MRI of the head for age. Electronically Signed   By: Elon Alas M.D.   On: 04/16/2017 22:54    Microbiology: No results found for this or any previous visit (from the past 240 hour(s)).   Labs: Basic Metabolic Panel: Recent Labs  Lab 04/16/17 1623 04/16/17 1735 04/17/17 0347  NA 133*  --  134*  K 3.0*  --  3.7  CL 99*  --  102  CO2 17*  --  20*  GLUCOSE 147*  --  156*  BUN 15  --  10  CREATININE 1.02* 0.99 0.90  CALCIUM 9.4  --  8.8*  MG  --  1.8 2.1   Liver Function Tests: Recent Labs  Lab 04/16/17 1735  AST 33  ALT 18  ALKPHOS 81  BILITOT 0.7  PROT 6.8  ALBUMIN 3.9   No results for input(s): LIPASE, AMYLASE in the last 168 hours. No results for  input(s): AMMONIA in the last 168 hours. CBC: Recent Labs  Lab 04/16/17 1623 04/17/17 0347  WBC 7.8 8.3  HGB 12.6 12.7  HCT 37.8 36.7  MCV 87.7 88.2  PLT 283 286   Cardiac Enzymes: No results for input(s): CKTOTAL, CKMB, CKMBINDEX, TROPONINI in the last 168 hours. BNP: BNP (last 3 results) No results for input(s): BNP in the last 8760 hours.  ProBNP (last 3 results) No results for input(s): PROBNP in the last 8760 hours.  CBG: Recent Labs  Lab 04/16/17 1630  GLUCAP 158*       SignedBarbette Merino MD.  [facility] 04/17/2017, 6:07 PM

## 2017-04-17 NOTE — Progress Notes (Signed)
EEG completed, results pending. 

## 2017-04-18 ENCOUNTER — Telehealth: Payer: Self-pay | Admitting: *Deleted

## 2017-04-18 NOTE — Telephone Encounter (Signed)
Transition Care Management Follow-up Telephone Call   Date discharged? 04/17/17   How have you been since you were released from the hospital? Pt states she is doing alright still kind of weak   Do you understand why you were in the hospital? YES   Do you understand the discharge instructions? YES   Where were you discharged to? Home   Items Reviewed:  Medications reviewed: YES  Allergies reviewed: YES  Dietary changes reviewed: NO  Referrals reviewed: No referral needed   Functional Questionnaire:   Activities of Daily Living (ADLs):   She states she are independent in the following: ambulation, bathing and hygiene, feeding, continence, grooming, toileting and dressing States she doesn't  require assistance she just takes her time   Any transportation issues/concerns?: NO   Any patient concerns? NO   Confirmed importance and date/time of follow-up visits scheduled YES, appt 04/26/17  Provider Appointment booked with Dr. Sharlet Salina  Confirmed with patient if condition begins to worsen call PCP or go to the ER.  Patient was given the office number and encouraged to call back with question or concerns.  : YES

## 2017-04-19 NOTE — Consult Note (Signed)
            [facility] Primary Care Navigator  04/19/2017  Diana Hendrix 03-Jan-1946 [id number]  Attempt to seepatient at the bedside to identify possible discharge needsbutshe was already dischargedhome.  Per chart review, patient was seen after she went in to orthopedic office for knee injection but developed dizziness and passed out; noted to have been having tonic-clonic seizures.  Primary care provider's officeis listed as providingtransition of care (TOC)follow-up.   Patient has discharge instruction to follow-up with primary care provider post hospitalization.   For additional questions please contact:  Edwena Felty A. Kendyll Huettner, BSN, RN-BC [facility] PRIMARY CARE Navigator Cell: [phone]

## 2017-04-24 DIAGNOSIS — G8929 Other chronic pain: Secondary | ICD-10-CM | POA: Diagnosis not present

## 2017-04-24 DIAGNOSIS — Z8679 Personal history of other diseases of the circulatory system: Secondary | ICD-10-CM | POA: Diagnosis not present

## 2017-04-24 DIAGNOSIS — M170 Bilateral primary osteoarthritis of knee: Secondary | ICD-10-CM | POA: Diagnosis not present

## 2017-04-26 ENCOUNTER — Encounter: Payer: Self-pay | Admitting: Internal Medicine

## 2017-04-26 ENCOUNTER — Ambulatory Visit (INDEPENDENT_AMBULATORY_CARE_PROVIDER_SITE_OTHER): Payer: Medicare Other | Admitting: Internal Medicine

## 2017-04-26 VITALS — BP 110/70 | HR 62 | Temp 97.7°F | Ht 63.0 in | Wt 202.0 lb

## 2017-04-26 DIAGNOSIS — I10 Essential (primary) hypertension: Secondary | ICD-10-CM

## 2017-04-26 DIAGNOSIS — R55 Syncope and collapse: Secondary | ICD-10-CM | POA: Diagnosis not present

## 2017-04-26 NOTE — Progress Notes (Signed)
   Subjective:    Patient ID: Diana Hendrix, female    DOB: [birth year], 72 y.o.   MRN: [id number]  HPI The patient is a 72 YO female coming in for hospital follow up (passed out in the orthopedic office after cortisone injection, some concern about seizures given bladder release, no shaking witnessed, no post ictal state). She had EEG and CT head without findings. She denies fevers or chills. No chest pains or coughing or SOB. She denies diarrhea or vomiting. She had vomiting after the event and nausea which is resolved. Went back to orthopedic and they do not think she should do any more shots in her knees. Still feeling slightly tired.   PMH, [facility], social history reviewed and updated.   Review of Systems  Constitutional: Negative.   HENT: Negative.   Eyes: Negative.   Respiratory: Negative for cough, chest tightness and shortness of breath.   Cardiovascular: Negative for chest pain, palpitations and leg swelling.  Gastrointestinal: Negative for abdominal distention, abdominal pain, constipation, diarrhea, nausea and vomiting.  Musculoskeletal: Negative.   Skin: Negative.   Neurological: Negative.   Psychiatric/Behavioral: Negative.       Objective:   Physical Exam  Constitutional: She is oriented to person, place, and time. She appears well-developed and well-nourished.  HENT:  Head: Normocephalic and atraumatic.  Eyes: EOM are normal.  Neck: Normal range of motion.  Cardiovascular: Normal rate and regular rhythm.  Pulmonary/Chest: Effort normal and breath sounds normal. No respiratory distress. She has no wheezes. She has no rales.  Abdominal: Soft. Bowel sounds are normal. She exhibits no distension. There is no tenderness. There is no rebound.  Musculoskeletal: She exhibits no edema.  Neurological: She is alert and oriented to person, place, and time. Coordination normal.  Skin: Skin is warm and dry.  Psychiatric: She has a normal mood and affect.   Vitals:   04/26/17 0854   BP: 110/70  Pulse: 62  Temp: 97.7 F (36.5 C)  TempSrc: Oral  SpO2: 96%  Weight: 202 lb (91.6 kg)  Height: 5\' 3"  (1.6 m)      Assessment & Plan:

## 2017-04-26 NOTE — Patient Instructions (Signed)
We are not checking any labs today.

## 2017-04-26 NOTE — Assessment & Plan Note (Signed)
BP at goal and no indication to change her amlodipine, metoprolol, olmesartan/hctz. Recent CMP okay.

## 2017-04-26 NOTE — Assessment & Plan Note (Signed)
I do not suspect that this was a true convulsion. Likely some hypoxia with syncope and vasovagal. She is reminded not to drive for 6 months after this event.

## 2017-06-02 ENCOUNTER — Other Ambulatory Visit: Payer: Self-pay | Admitting: Internal Medicine

## 2017-06-16 ENCOUNTER — Other Ambulatory Visit: Payer: Self-pay | Admitting: Internal Medicine

## 2017-06-30 ENCOUNTER — Other Ambulatory Visit: Payer: Self-pay | Admitting: Internal Medicine

## 2017-07-17 DIAGNOSIS — M25562 Pain in left knee: Secondary | ICD-10-CM | POA: Insufficient documentation

## 2017-07-18 DIAGNOSIS — M25562 Pain in left knee: Secondary | ICD-10-CM | POA: Diagnosis not present

## 2017-07-21 ENCOUNTER — Other Ambulatory Visit: Payer: Self-pay | Admitting: Internal Medicine

## 2017-07-25 DIAGNOSIS — S83232A Complex tear of medial meniscus, current injury, left knee, initial encounter: Secondary | ICD-10-CM | POA: Diagnosis not present

## 2017-07-27 ENCOUNTER — Telehealth: Payer: Self-pay | Admitting: Internal Medicine

## 2017-07-27 NOTE — Telephone Encounter (Signed)
Form is complete. We will fax back to her surgeon.

## 2017-07-27 NOTE — Telephone Encounter (Signed)
Copied from [place] [id number]. Topic: Appointment Scheduling - Scheduling Inquiry for Clinic >> Jul 27, 2017  1:29 PM Synthia Innocent wrote: Reason for CRM: Pt is going to have knee surgery soon by Dr Veverly Fells. His office is going to fax over surgical clearance. Patient would like to know if she needs appt? Please advise

## 2017-07-27 NOTE — Telephone Encounter (Signed)
Forms faxed and patient informed

## 2017-08-08 DIAGNOSIS — S83272A Complex tear of lateral meniscus, current injury, left knee, initial encounter: Secondary | ICD-10-CM | POA: Diagnosis not present

## 2017-08-08 DIAGNOSIS — S83232A Complex tear of medial meniscus, current injury, left knee, initial encounter: Secondary | ICD-10-CM | POA: Diagnosis not present

## 2017-08-08 DIAGNOSIS — X58XXXA Exposure to other specified factors, initial encounter: Secondary | ICD-10-CM | POA: Diagnosis not present

## 2017-08-08 DIAGNOSIS — G8918 Other acute postprocedural pain: Secondary | ICD-10-CM | POA: Diagnosis not present

## 2017-08-08 DIAGNOSIS — M94262 Chondromalacia, left knee: Secondary | ICD-10-CM | POA: Diagnosis not present

## 2017-08-08 DIAGNOSIS — Y999 Unspecified external cause status: Secondary | ICD-10-CM | POA: Diagnosis not present

## 2017-08-23 DIAGNOSIS — Z4789 Encounter for other orthopedic aftercare: Secondary | ICD-10-CM | POA: Insufficient documentation

## 2017-09-15 ENCOUNTER — Other Ambulatory Visit: Payer: Self-pay | Admitting: Internal Medicine

## 2017-10-04 ENCOUNTER — Ambulatory Visit (INDEPENDENT_AMBULATORY_CARE_PROVIDER_SITE_OTHER): Payer: Medicare Other | Admitting: Internal Medicine

## 2017-10-04 ENCOUNTER — Telehealth: Payer: Self-pay | Admitting: Internal Medicine

## 2017-10-04 ENCOUNTER — Encounter: Payer: Self-pay | Admitting: Internal Medicine

## 2017-10-04 VITALS — BP 120/80 | HR 62 | Temp 97.7°F | Ht 63.0 in | Wt 207.0 lb

## 2017-10-04 DIAGNOSIS — R7301 Impaired fasting glucose: Secondary | ICD-10-CM | POA: Diagnosis not present

## 2017-10-04 DIAGNOSIS — Z23 Encounter for immunization: Secondary | ICD-10-CM | POA: Diagnosis not present

## 2017-10-04 NOTE — Progress Notes (Signed)
   Subjective:    Patient ID: Diana Hendrix, female    DOB: 19-Dec-1945, 72 y.o.   MRN: [id number]  HPI The patient is a 72 YO female coming in for follow up of pre-diabetes. She is gaining weight since last visit. Has had knee surgery on left knee and this kept her from being active for the last 2-3 months. She is snacking some more and her husband likes snacks so they are always in their house. She denies numbness or tingling in hands or feet. Denies extreme thirst or hunger. Denies urinating a lot.   Review of Systems  Constitutional: Negative.   HENT: Negative.   Eyes: Negative.   Respiratory: Negative for cough, chest tightness and shortness of breath.   Cardiovascular: Negative for chest pain, palpitations and leg swelling.  Gastrointestinal: Negative for abdominal distention, abdominal pain, constipation, diarrhea, nausea and vomiting.  Musculoskeletal: Positive for arthralgias and gait problem.  Skin: Negative.   Psychiatric/Behavioral: Negative.       Objective:   Physical Exam  Constitutional: She is oriented to person, place, and time. She appears well-developed and well-nourished.  HENT:  Head: Normocephalic and atraumatic.  Eyes: EOM are normal.  Neck: Normal range of motion.  Cardiovascular: Normal rate and regular rhythm.  Pulmonary/Chest: Effort normal and breath sounds normal. No respiratory distress. She has no wheezes. She has no rales.  Abdominal: Soft. Bowel sounds are normal. She exhibits no distension. There is no tenderness. There is no rebound.  Musculoskeletal: She exhibits tenderness. She exhibits no edema.  Left knee pain  Neurological: She is alert and oriented to person, place, and time. Coordination normal.  Skin: Skin is warm and dry.  Psychiatric: She has a normal mood and affect.   Vitals:   10/04/17 0941  BP: 120/80  Pulse: 62  Temp: 97.7 F (36.5 C)  TempSrc: Oral  SpO2: 98%  Weight: 207 lb (93.9 kg)  Height: 5\' 3"  (1.6 m)        Assessment & Plan:  Flu shot given at visit

## 2017-10-04 NOTE — Assessment & Plan Note (Signed)
BMI >98 and complicated by athritis, OSA, hypertension, hyperlipidemia and pre-diabetes. She is up about 5 pounds since last visit due to immobility due to knee surgery. She is going to work on limiting snacks and increasing mobility at home.

## 2017-10-04 NOTE — Telephone Encounter (Signed)
Copied from [place] [phone]. Topic: General - Other >> Oct 04, 2017  4:39 PM Wynetta Emery, [place] C wrote: Reason for CRM: pt says that she was suppose to have labs today after her visit but she forgot. Pt is going to see her nephrologist tomorrow and will have labs completed there also. She will have provider to fax PCP her results.

## 2017-10-04 NOTE — Assessment & Plan Note (Signed)
>>  ASSESSMENT AND PLAN FOR IMPAIRED FASTING BLOOD SUGAR WRITTEN ON 10/04/2017 10:21 AM BY CRAWFORD, ELIZABETH A, MD  Weight is up about 5 pounds since last visit. Checking HgA1c and would not be surprised if this is up some. We talked about limiting snacks kept in the house as well as increasing exercise as she is able with her knees and doing arm and upper body exercises in the meantime. She is motivated to work on [employer].

## 2017-10-04 NOTE — Telephone Encounter (Signed)
fyi

## 2017-10-04 NOTE — Assessment & Plan Note (Signed)
Weight is up about 5 pounds since last visit. Checking HgA1c and would not be surprised if this is up some. We talked about limiting snacks kept in the house as well as increasing exercise as she is able with her knees and doing arm and upper body exercises in the meantime. She is motivated to work on [employer].

## 2017-10-04 NOTE — Patient Instructions (Signed)
We have given you the flu shot today and will check the sugar levels.

## 2017-10-05 DIAGNOSIS — E785 Hyperlipidemia, unspecified: Secondary | ICD-10-CM | POA: Diagnosis not present

## 2017-10-05 DIAGNOSIS — N189 Chronic kidney disease, unspecified: Secondary | ICD-10-CM | POA: Diagnosis not present

## 2017-10-05 DIAGNOSIS — E119 Type 2 diabetes mellitus without complications: Secondary | ICD-10-CM | POA: Diagnosis not present

## 2017-10-05 DIAGNOSIS — E7800 Pure hypercholesterolemia, unspecified: Secondary | ICD-10-CM | POA: Diagnosis not present

## 2017-10-05 DIAGNOSIS — D649 Anemia, unspecified: Secondary | ICD-10-CM | POA: Diagnosis not present

## 2017-10-05 DIAGNOSIS — E039 Hypothyroidism, unspecified: Secondary | ICD-10-CM | POA: Diagnosis not present

## 2017-10-05 DIAGNOSIS — I10 Essential (primary) hypertension: Secondary | ICD-10-CM | POA: Diagnosis not present

## 2017-10-11 ENCOUNTER — Ambulatory Visit: Payer: Medicare Other | Admitting: Internal Medicine

## 2017-10-20 ENCOUNTER — Other Ambulatory Visit: Payer: Self-pay | Admitting: Internal Medicine

## 2017-11-27 ENCOUNTER — Telehealth: Payer: Self-pay | Admitting: Pulmonary Disease

## 2017-11-27 NOTE — Telephone Encounter (Signed)
Spoke with [facility] [place]) and they stated the patient had to wait two more days on 11/14 to be qualified for new supplies so the order should be released at that time. I called pt to let her know and advised her to call if she didn't receive them from [facility]. Pt understood. Nothing further is needed.

## 2017-12-05 ENCOUNTER — Telehealth: Payer: Self-pay | Admitting: Pulmonary Disease

## 2017-12-05 NOTE — Telephone Encounter (Signed)
Called and spoke with Patient.  Her Last OV was 08/25/16.  Explained that she needed a follow up appointment to get new supplies.  Patient stated understanding.  OV with Lazaro Arms, NP for 12/07/17.  Nothing further at this time.

## 2017-12-06 ENCOUNTER — Other Ambulatory Visit: Payer: Self-pay

## 2017-12-07 ENCOUNTER — Encounter: Payer: Self-pay | Admitting: Nurse Practitioner

## 2017-12-07 ENCOUNTER — Ambulatory Visit (INDEPENDENT_AMBULATORY_CARE_PROVIDER_SITE_OTHER): Payer: Medicare Other | Admitting: Nurse Practitioner

## 2017-12-07 VITALS — BP 128/64 | HR 76 | Ht 63.0 in | Wt 200.2 lb

## 2017-12-07 DIAGNOSIS — G4733 Obstructive sleep apnea (adult) (pediatric): Secondary | ICD-10-CM

## 2017-12-07 DIAGNOSIS — Z9989 Dependence on other enabling machines and devices: Secondary | ICD-10-CM

## 2017-12-07 NOTE — Progress Notes (Signed)
@Patient  ID: Diana Hendrix, female    DOB: 09-12-1945, 72 y.o.   MRN: [id number]  Chief Complaint  Patient presents with  . Follow-up    Referring provider: Hoyt Koch, *  HPI 72 year old female with OSA followed by Dr. Halford Chessman PMH: HTN, HLD, GERD, Macular degeneration  Sleep tests: PSG 09/17/12 >> RDI 13.7, SpO2 low 90%, PLMI 0, occasional PVC's and PAC's. Auto CPAP 07/26/16 to 08/24/16 >> used on 30 of 30 nights with average 8 hrs 46 min.  Average AHI 1.8 with median CPAP 10 and 95 th percentile CPAP 12 cm H2O  OV 12/07/17 - follow up: Patient presents for a follow up on CPAP/OSA. She states that she has been doing well. She is compliant with her CPAP. She wears it every night. States that she feels less drowsy throughout the day. She needs new supplies for her CPAP today. She denies any night mares or sleep walking. She denies any fever, chest pain, or shortness of breath.   CPAP compliance report 11/06/17 - 12/05/17: usage days 30/30 (100%), average usage 8 hours 36 minutes, AutoSet 5-15 cmH20, AHI: 1.6.     Allergies  Allergen Reactions  . Cortisone Other (See Comments)    seizure   . Glucosamine Forte [Nutritional Supplements] Nausea And Vomiting    Immunization History  Administered Date(s) Administered  . Influenza, High Dose Seasonal PF 10/09/2016, 10/04/2017  . Influenza,inj,Quad PF,6+ Mos 10/25/2012, 09/17/2013, 09/21/2015  . Influenza-Unspecified 12/12/2014  . Pneumococcal Conjugate-13 03/18/2015  . Pneumococcal Polysaccharide-23 09/17/2013  . Tdap 03/18/2015  . Zoster 09/17/2011    Past Medical History:  Diagnosis Date  . GERD (gastroesophageal reflux disease)   . Hyperlipidemia   . Hypertension   . Macular edema, cystoid   . OSA (obstructive sleep apnea) 09/17/2012    Tobacco History: Social History   Tobacco Use  Smoking Status Never Smoker  Smokeless Tobacco Never Used   Counseling given: Yes   Outpatient Encounter Medications as  of 12/07/2017  Medication Sig  . amitriptyline (ELAVIL) 10 MG tablet TAKE 2 TABLETS(20 MG) BY MOUTH AT BEDTIME  . amLODipine (NORVASC) 5 MG tablet Take 5 mg by mouth every evening.   . Cholecalciferol (VITAMIN D-3) 1000 UNITS CAPS Take 1 capsule by mouth daily.   . citalopram (CELEXA) 10 MG tablet TAKE 1 TABLET(10 MG) BY MOUTH DAILY  . Coenzyme Q10 (COQ10 PO) Take 1 tablet by mouth every morning.  . fluticasone (FLONASE) 50 MCG/ACT nasal spray Place 2 sprays into both nostrils daily. (Patient taking differently: Place 2 sprays into both nostrils daily as needed for allergies. )  . metoprolol succinate (TOPROL-XL) 50 MG 24 hr tablet Take 75 mg by mouth daily. Take with or immediately following a meal.   . olmesartan-hydrochlorothiazide (BENICAR HCT) 40-12.5 MG per tablet Take 1 tablet by mouth daily.  . pantoprazole (PROTONIX) 40 MG tablet TAKE 1 TABLET(40 MG) BY MOUTH DAILY  . Polyethyl Glycol-Propyl Glycol (SYSTANE OP) Place 1 drop into both eyes 4 (four) times daily.  . polyethylene glycol powder (GLYCOLAX/MIRALAX) powder MIX 1 CAPFUL IN 8 OUNCES OF WATER AND DRINK ONCE A DAY  . simvastatin (ZOCOR) 10 MG tablet TAKE 1 TABLET(10 MG) BY MOUTH AT BEDTIME  . TURMERIC PO Take 500 mg by mouth daily.   No facility-administered encounter medications on file as of 12/07/2017.      Review of Systems  Review of Systems  Constitutional: Negative.  Negative for chills and fever.  HENT: Negative.  Negative for congestion, sinus pressure and sinus pain.   Respiratory: Negative for cough and shortness of breath.   Cardiovascular: Negative.  Negative for chest pain, palpitations and leg swelling.  Gastrointestinal: Negative.   Allergic/Immunologic: Negative.   Neurological: Negative.   Psychiatric/Behavioral: Negative.        Physical Exam  BP 128/64 (BP Location: Left Arm, Patient Position: Sitting, Cuff Size: Normal)   Pulse 76   Ht 5\' 3"  (1.6 m)   Wt 200 lb 3.2 oz (90.8 kg)   SpO2 96%    BMI 35.46 kg/m   Wt Readings from Last 5 Encounters:  12/07/17 200 lb 3.2 oz (90.8 kg)  10/04/17 207 lb (93.9 kg)  04/26/17 202 lb (91.6 kg)  04/16/17 205 lb 14.6 oz (93.4 kg)  04/09/17 205 lb (93 kg)     Physical Exam  Constitutional: She is oriented to person, place, and time. She appears well-developed and well-nourished. No distress.  Cardiovascular: Normal rate and regular rhythm.  Pulmonary/Chest: Effort normal and breath sounds normal. No respiratory distress. She has no wheezes. She has no rales.  Musculoskeletal: She exhibits no edema.  Neurological: She is alert and oriented to person, place, and time.  Psychiatric: She has a normal mood and affect.  Nursing note and vitals reviewed.      Assessment & Plan:   OSA (obstructive sleep apnea) Patient Instructions  Patient continues to benefit from CPAP with good compliance and control documented Will order new supplies Continue CPAP at current settings 5-15 cmH20  Continue current medications Goal of 4 hours or more usage per night Maintain healthy weight Do not drive if drowsy Follow up with Dr. Halford Chessman  in 1 year or sooner if needed        Fenton Foy, NP 12/07/2017

## 2017-12-07 NOTE — Assessment & Plan Note (Signed)
Patient Instructions  Patient continues to benefit from CPAP with good compliance and control documented Will order new supplies Continue CPAP at current settings 5-15 cmH20  Continue current medications Goal of 4 hours or more usage per night Maintain healthy weight Do not drive if drowsy Follow up with Dr. Halford Chessman  in 1 year or sooner if needed

## 2017-12-07 NOTE — Patient Instructions (Addendum)
Patient continues to benefit from CPAP with good compliance and control documented Will order new supplies Continue CPAP at current settings 5-15 cmH20  Continue current medications Goal of 4 hours or more usage per night Maintain healthy weight Do not drive if drowsy Follow up with Dr. Halford Chessman  in 1 year or sooner if needed

## 2017-12-17 ENCOUNTER — Other Ambulatory Visit: Payer: Self-pay | Admitting: Internal Medicine

## 2017-12-18 DIAGNOSIS — Z1231 Encounter for screening mammogram for malignant neoplasm of breast: Secondary | ICD-10-CM | POA: Diagnosis not present

## 2017-12-18 LAB — HM MAMMOGRAPHY

## 2017-12-20 ENCOUNTER — Encounter: Payer: Self-pay | Admitting: Internal Medicine

## 2017-12-20 NOTE — Progress Notes (Signed)
Abstracted and sent to scan  

## 2017-12-22 ENCOUNTER — Other Ambulatory Visit: Payer: Self-pay | Admitting: Internal Medicine

## 2017-12-25 NOTE — Progress Notes (Signed)
Reviewed and agree with assessment/plan.   Lela Gell, MD [facility] 01/12/2016, 12:24 PM Pager:  [phone]  

## 2018-03-11 DIAGNOSIS — H524 Presbyopia: Secondary | ICD-10-CM | POA: Diagnosis not present

## 2018-03-11 DIAGNOSIS — H04123 Dry eye syndrome of bilateral lacrimal glands: Secondary | ICD-10-CM | POA: Diagnosis not present

## 2018-03-11 DIAGNOSIS — Z961 Presence of intraocular lens: Secondary | ICD-10-CM | POA: Diagnosis not present

## 2018-03-16 ENCOUNTER — Other Ambulatory Visit: Payer: Self-pay | Admitting: Internal Medicine

## 2018-03-26 DIAGNOSIS — D649 Anemia, unspecified: Secondary | ICD-10-CM | POA: Diagnosis not present

## 2018-03-26 DIAGNOSIS — E039 Hypothyroidism, unspecified: Secondary | ICD-10-CM | POA: Diagnosis not present

## 2018-03-26 DIAGNOSIS — N189 Chronic kidney disease, unspecified: Secondary | ICD-10-CM | POA: Diagnosis not present

## 2018-03-26 DIAGNOSIS — M109 Gout, unspecified: Secondary | ICD-10-CM | POA: Diagnosis not present

## 2018-03-26 DIAGNOSIS — E119 Type 2 diabetes mellitus without complications: Secondary | ICD-10-CM | POA: Diagnosis not present

## 2018-03-26 DIAGNOSIS — I10 Essential (primary) hypertension: Secondary | ICD-10-CM | POA: Diagnosis not present

## 2018-03-26 LAB — HEPATIC FUNCTION PANEL
ALT: 11 (ref 7–35)
AST: 18 (ref 13–35)
Alkaline Phosphatase: 93 (ref 25–125)
Bilirubin, Total: 0.7

## 2018-03-26 LAB — CBC AND DIFFERENTIAL
HCT: 40 (ref 36–46)
Hemoglobin: 13.5 (ref 12.0–16.0)
Neutrophils Absolute: 4
Platelets: 279 (ref 150–399)
WBC: 5.6

## 2018-03-26 LAB — BASIC METABOLIC PANEL
BUN: 19 (ref 4–21)
Creatinine: 0.9 (ref 0.5–1.1)
Glucose: 93
Potassium: 4 (ref 3.4–5.3)
Sodium: 134 — AB (ref 137–147)

## 2018-03-26 LAB — LIPID PANEL
Cholesterol: 181 (ref 0–200)
HDL: 45 (ref 35–70)
LDL Cholesterol: 111
Triglycerides: 156 (ref 40–160)

## 2018-03-26 LAB — HEMOGLOBIN A1C: Hemoglobin A1C: 5.8

## 2018-04-11 ENCOUNTER — Telehealth: Payer: Self-pay | Admitting: *Deleted

## 2018-04-11 NOTE — Telephone Encounter (Signed)
Diana P., front office team lead to call pt and advise of virtual visit instructions.

## 2018-04-11 NOTE — Telephone Encounter (Signed)
Convert to virtual if able please

## 2018-04-11 NOTE — Telephone Encounter (Signed)
I called patient Re: 04/12/18 OV with PCP for 6 mo f/u due to COVID-19 pandemic. Initially patient stated she was asymptomatic. We discussed her missed Hgb A1c from 09/2017. Patient states her A1c was checked on 03/26/18 and was 5.7. She c/o of a dizzy spell episode at the end of February. She states the episode lasted approximately 5 hours. She was seated and eating her lunch when it started. Then she felt nauseated. She checked he BP and it was normal.  Also, she was at [facility] 2 weeks ago and states the pollen count was high. Since she c/o throat pain and a "terrible cough", but she feels this is allergy related. She does not take OTC meds for he allergies. Please advise, keep ROV on 04/12/18, virtual visit or R/S.

## 2018-04-12 ENCOUNTER — Ambulatory Visit (INDEPENDENT_AMBULATORY_CARE_PROVIDER_SITE_OTHER): Payer: Medicare Other | Admitting: Internal Medicine

## 2018-04-12 ENCOUNTER — Encounter: Payer: Self-pay | Admitting: Internal Medicine

## 2018-04-12 DIAGNOSIS — F5101 Primary insomnia: Secondary | ICD-10-CM | POA: Diagnosis not present

## 2018-04-12 DIAGNOSIS — R7301 Impaired fasting glucose: Secondary | ICD-10-CM | POA: Diagnosis not present

## 2018-04-12 DIAGNOSIS — K219 Gastro-esophageal reflux disease without esophagitis: Secondary | ICD-10-CM | POA: Diagnosis not present

## 2018-04-12 DIAGNOSIS — I10 Essential (primary) hypertension: Secondary | ICD-10-CM

## 2018-04-12 NOTE — Assessment & Plan Note (Signed)
>>  ASSESSMENT AND PLAN FOR IMPAIRED FASTING BLOOD SUGAR WRITTEN ON 04/12/2018 10:40 AM BY CRAWFORD, ELIZABETH A, MD  Most recent HgA1c 5.8 per patient at nephrologist and has been working on weight loss. Encouraged to continue.

## 2018-04-12 NOTE — Progress Notes (Signed)
Virtual Visit via Video Note  I connected with Diana Hendrix on 04/12/18 at  9:20 AM EDT by a video enabled telemedicine application and verified that I am speaking with the correct person using two identifiers.   I discussed the limitations of evaluation and management by telemedicine and the availability of in person appointments. The patient expressed understanding and agreed to proceed.  History of Present Illness: The patient is a 73 y.o. YO female presents for virtual visit for follow up of blood pressure (taking amlodipine and olmesartan/hctz, denies chest pains or headaches, BP at nephrologist earlier this month at goal, denies side effects, 126/80 BP at home) and insomnia (taking elavil and celexa, overall mood and sleep is stable, slightly more anxiety about health with coronavirus outbreak, she is staying home and coping well overall) and GERD (taking protonix daily, controls her symptoms well, denies breakthrough symptoms, if she misses she does get symptoms, improves QOL). Weight is down about 9 pounds in the last year. Recent labs with nephrology and she will mail [place] a copy.  Observations/Objective: Appearance: normal, breathing appears normal, good grooming, abdomen does not appear distended, throat not examined, memory normal, mental status is A and O times 3  Assessment and Plan: See problem oriented charting  Follow Up Instructions:  I discussed the assessment and treatment plan with the patient. The patient was provided an opportunity to ask questions and all were answered. The patient agreed with the plan and demonstrated an understanding of the instructions.   The patient was advised to call back or seek an in-person evaluation if the symptoms worsen or if the condition fails to improve as anticipated.  I provided 25 minutes of non-face-to-face time during this encounter.  Hoyt Koch, MD

## 2018-04-12 NOTE — Assessment & Plan Note (Signed)
Taking amlodipine and olmesartan/hctz with good reported BP control. Recent labs at nephrology and patient agrees to send [place] those in the mail. Will review and decide if any additional labs are needed.

## 2018-04-12 NOTE — Assessment & Plan Note (Signed)
Most recent HgA1c 5.8 per patient at nephrologist and has been working on weight loss. Encouraged to continue.

## 2018-04-12 NOTE — Assessment & Plan Note (Signed)
Taking elavil and celexa and doing well overall. We talked about the importance of coping skills during this outbreak as it will strain her mental health. Good coping skills in place.

## 2018-04-12 NOTE — Assessment & Plan Note (Signed)
Taking protonix daily, will refill. She does get symptoms is missed or late doses. Benefit outweighs risk at this time.

## 2018-04-18 ENCOUNTER — Encounter: Payer: Self-pay | Admitting: Internal Medicine

## 2018-04-18 NOTE — Progress Notes (Signed)
Abstracted and sent to scan  

## 2018-04-20 ENCOUNTER — Other Ambulatory Visit: Payer: Self-pay | Admitting: Internal Medicine

## 2018-05-14 ENCOUNTER — Ambulatory Visit (INDEPENDENT_AMBULATORY_CARE_PROVIDER_SITE_OTHER): Payer: Medicare Other | Admitting: *Deleted

## 2018-05-14 DIAGNOSIS — Z0000 Encounter for general adult medical examination without abnormal findings: Secondary | ICD-10-CM | POA: Diagnosis not present

## 2018-05-14 NOTE — Progress Notes (Signed)
Subjective:   Diana Hendrix is a 73 y.o. female who presents for Medicare Annual (Subsequent) preventive examination.  I connected with patient 05/14/18 at  1:00 PM EDT by a video enabled telemedicine application and verified that I am speaking with the correct person using two identifiers. Patient stated full name and DOB. Patient gave permission to continue with virtual visit. Patient's location was at home and Nurse's location was at [place] office.   Review of Systems:  No ROS.  Medicare Wellness Visit. Additional risk factors are reflected in the social history.  Cardiac Risk Factors include: advanced age (>41men, >23 women);obesity (BMI >30kg/m2);hypertension Sleep patterns: feels rested on waking, gets up 0-1 times nightly to void and sleeps 7 hours nightly. Wears C-PAP  Home Safety/Smoke Alarms: Feels safe in home. Smoke alarms in place.  Living environment; residence and Firearm Safety: 1-story house/ trailer. Lives with husband, no needs for DME, good support system  Seat Belt Safety/Bike Helmet: Wears seat belt.      Objective:     Vitals: There were no vitals taken for this visit.  There is no height or weight on file to calculate BMI.  Advanced Directives 05/14/2018 04/17/2017  Does Patient Have a Medical Advance Directive? Yes Yes  Type of Paramedic of [place];Living will [place]  Does patient want to make changes to medical advance directive? - No - Patient declined  Copy of [place] in Chart? No - copy requested No - copy requested    Tobacco Social History   Tobacco Use  Smoking Status Never Smoker  Smokeless Tobacco Never Used     Counseling given: Not Answered  Past Medical History:  Diagnosis Date  . GERD (gastroesophageal reflux disease)   . Hyperlipidemia   . Hypertension   . Macular edema, cystoid   . OSA (obstructive sleep apnea) 09/17/2012   Past Surgical History:  Procedure  Laterality Date  . CESAREAN SECTION    . MENISCUS REPAIR Right 09/13/2016  . SHOULDER SURGERY    . TUBAL LIGATION     Family History  Problem Relation Age of Onset  . Hypertension Mother   . Atrial fibrillation Mother   . Diabetes Mother    Social History   Socioeconomic History  . Marital status: Married    Spouse name: Not on file  . Number of children: 2  . Years of education: Not on file  . Highest education level: Not on file  Occupational History  . Not on file  Social Needs  . Financial resource strain: Not hard at all  . Food insecurity:    Worry: Never true    Inability: Never true  . Transportation needs:    Medical: No    Non-medical: No  Tobacco Use  . Smoking status: Never Smoker  . Smokeless tobacco: Never Used  Substance and Sexual Activity  . Alcohol use: No  . Drug use: No  . Sexual activity: Not Currently  Lifestyle  . Physical activity:    Days per week: 5 days    Minutes per session: 40 min  . Stress: Not on file  Relationships  . Social connections:    Talks on phone: More than three times a week    Gets together: More than three times a week    Attends religious service: 1 to 4 times per year    Active member of club or organization: Yes    Attends meetings of clubs  or organizations: 1 to 4 times per year    Relationship status: Married  Other Topics Concern  . Not on file  Social History Narrative  . Not on file    Outpatient Encounter Medications as of 05/14/2018  Medication Sig  . amitriptyline (ELAVIL) 10 MG tablet TAKE 2 TABLETS(20 MG) BY MOUTH AT BEDTIME  . amLODipine (NORVASC) 5 MG tablet Take 5 mg by mouth every evening.   . Cholecalciferol (VITAMIN D-3) 1000 UNITS CAPS Take 1 capsule by mouth daily.   . citalopram (CELEXA) 10 MG tablet TAKE 1 TABLET(10 MG) BY MOUTH DAILY  . Coenzyme Q10 (COQ10 PO) Take 1 tablet by mouth every morning.  . fluticasone (FLONASE) 50 MCG/ACT nasal spray Place 2 sprays into both nostrils daily.  (Patient taking differently: Place 2 sprays into both nostrils daily as needed for allergies. )  . metoprolol succinate (TOPROL-XL) 50 MG 24 hr tablet Take 75 mg by mouth daily. Take with or immediately following a meal.   . olmesartan-hydrochlorothiazide (BENICAR HCT) 40-12.5 MG per tablet Take 1 tablet by mouth daily.  . pantoprazole (PROTONIX) 40 MG tablet TAKE 1 TABLET(40 MG) BY MOUTH DAILY  . Polyethyl Glycol-Propyl Glycol (SYSTANE OP) Place 1 drop into both eyes 4 (four) times daily.  . polyethylene glycol powder (GLYCOLAX/MIRALAX) powder MIX 1 CAPFUL IN 8 OUNCES OF WATER AND DRINK ONCE A DAY  . Probiotic Product (PROBIOTIC DAILY PO) Take 1 tablet by mouth daily.  . simvastatin (ZOCOR) 10 MG tablet TAKE 1 TABLET(10 MG) BY MOUTH AT BEDTIME  . TURMERIC PO Take 500 mg by mouth daily.   No facility-administered encounter medications on file as of 05/14/2018.     Activities of Daily Living In your present state of health, do you have any difficulty performing the following activities: 05/14/2018  Hearing? N  Vision? N  Difficulty concentrating or making decisions? N  Walking or climbing stairs? N  Dressing or bathing? N  Doing errands, shopping? N  Preparing Food and eating ? N  Using the Toilet? N  In the past six months, have you accidently leaked urine? N  Do you have problems with loss of bowel control? N  Managing your Medications? N  Managing your Finances? N  Housekeeping or managing your Housekeeping? N  Some recent data might be hidden    Patient Care Team: Hoyt Koch, MD as PCP - General (Internal Medicine) Jola Schmidt, MD as Consulting Physician (Ophthalmology) Chesley Mires, MD as Consulting Physician (Pulmonary Disease)    Assessment:   This is a routine wellness examination for Diana Hendrix. Physical assessment deferred to PCP.   Exercise Activities and Dietary recommendations Current Exercise Habits: Home exercise routine, Type of exercise: walking,  Time (Minutes): 40, Frequency (Times/Week): 5, Weekly Exercise (Minutes/Week): 200, Intensity: Mild, Exercise limited by: orthopedic condition(s)  Diet (meal preparation, eat out, water intake, caffeinated beverages, dairy products, fruits and vegetables): in general, a "healthy" diet  , well balanced   Currently doing Weight Watchers online. States she has lost around 9 pounds.   Goals   None     Fall Risk Fall Risk  05/14/2018 12/06/2017 12/21/2016 10/09/2016 03/18/2015  Falls in the past year? 0 0 No No No  Comment - Emmi Telephone Survey: data to providers prior to load Emmi Telephone Survey: data to providers prior to load - -  Number falls in past yr: 0 - - - -    Depression Screen PHQ 2/9 Scores 05/14/2018 10/09/2016 03/18/2015  PHQ -  2 Score 0 0 0     Cognitive Function       Ad8 score reviewed for issues:  Issues making decisions: no  Less interest in hobbies / activities: no  Repeats questions, stories (family complaining): no  Trouble using ordinary gadgets (microwave, computer, phone):no  Forgets the month or year: no  Mismanaging finances: no  Remembering appts: no  Daily problems with thinking and/or memory: no Ad8 score is= 0  Immunization History  Administered Date(s) Administered  . Influenza, High Dose Seasonal PF 10/09/2016, 10/04/2017  . Influenza,inj,Quad PF,6+ Mos 10/25/2012, 09/17/2013, 09/21/2015  . Influenza-Unspecified 12/12/2014  . Pneumococcal Conjugate-13 03/18/2015  . Pneumococcal Polysaccharide-23 09/17/2013  . Tdap 03/18/2015  . Zoster 09/17/2011    Screening Tests Health Maintenance  Topic Date Due  . INFLUENZA VACCINE  08/17/2018  . MAMMOGRAM  12/19/2019  . COLONOSCOPY  10/30/2023  . TETANUS/TDAP  03/17/2025  . DEXA SCAN  Completed  . Hepatitis C Screening  Completed  . PNA vac Low Risk Adult  Completed      Plan:    Reviewed health maintenance screenings with patient today and relevant education, vaccines, and/or  referrals were provided.   Continue to eat heart healthy diet (full of fruits, vegetables, whole grains, lean protein, water--limit salt, fat, and sugar intake) and increase physical activity as tolerated.  Continue doing brain stimulating activities (puzzles, reading, adult coloring books, staying active) to keep memory sharp.   I have personally reviewed and noted the following in the patient's chart:   . Medical and social history . Use of alcohol, tobacco or illicit drugs  . Current medications and supplements . Functional ability and status . Nutritional status . Physical activity . Advanced directives . List of other physicians . Screenings to include cognitive, depression, and falls . Referrals and appointments  In addition, I have reviewed and discussed with patient certain preventive protocols, quality metrics, and best practice recommendations. A written personalized care plan for preventive services as well as general preventive health recommendations were provided to patient.     Michiel Cowboy, RN  05/14/2018

## 2018-05-14 NOTE — Progress Notes (Signed)
Medical screening examination/treatment/procedure(s) were performed by non-physician practitioner and as supervising physician I was immediately available for consultation/collaboration. I agree with above. Lorn Butcher A Kobie Whidby, MD 

## 2018-05-21 DIAGNOSIS — L820 Inflamed seborrheic keratosis: Secondary | ICD-10-CM | POA: Diagnosis not present

## 2018-05-21 DIAGNOSIS — D2239 Melanocytic nevi of other parts of face: Secondary | ICD-10-CM | POA: Diagnosis not present

## 2018-06-01 ENCOUNTER — Other Ambulatory Visit: Payer: Self-pay | Admitting: Internal Medicine

## 2018-06-18 ENCOUNTER — Telehealth: Payer: Self-pay | Admitting: Internal Medicine

## 2018-06-18 MED ORDER — PANTOPRAZOLE SODIUM 40 MG PO TBEC: DELAYED_RELEASE_TABLET | ORAL | 1 refills | Status: DC

## 2018-06-18 NOTE — Telephone Encounter (Signed)
Copied from [place] [id number]. Topic: Quick Communication - Rx Refill/Question >> Jun 18, 2018  3:59 PM Richardo Priest, NT wrote: Medication:  pantoprazole (PROTONIX) 40 MG tablet  Has the patient contacted their pharmacy? Yes they have sent requests.  Preferred Pharmacy (with phone number or street name):  [facility] [place], [place] - [address] AT [address] [place] [address] [phone] (Phone) [phone] (Fax)  Agent: Please be advised that RX refills may take up to 3 business days. We ask that you follow-up with your pharmacy.

## 2018-06-22 ENCOUNTER — Other Ambulatory Visit: Payer: Self-pay | Admitting: Internal Medicine

## 2018-06-27 DIAGNOSIS — D2239 Melanocytic nevi of other parts of face: Secondary | ICD-10-CM | POA: Diagnosis not present

## 2018-06-27 DIAGNOSIS — C44729 Squamous cell carcinoma of skin of left lower limb, including hip: Secondary | ICD-10-CM | POA: Diagnosis not present

## 2018-06-27 DIAGNOSIS — L820 Inflamed seborrheic keratosis: Secondary | ICD-10-CM | POA: Diagnosis not present

## 2018-07-20 ENCOUNTER — Other Ambulatory Visit: Payer: Self-pay | Admitting: Internal Medicine

## 2018-08-23 ENCOUNTER — Other Ambulatory Visit: Payer: Self-pay | Admitting: Internal Medicine

## 2018-09-10 ENCOUNTER — Other Ambulatory Visit: Payer: Self-pay | Admitting: Internal Medicine

## 2018-10-14 ENCOUNTER — Ambulatory Visit: Payer: Medicare Other | Admitting: Internal Medicine

## 2018-10-16 ENCOUNTER — Ambulatory Visit (INDEPENDENT_AMBULATORY_CARE_PROVIDER_SITE_OTHER): Payer: Medicare Other | Admitting: Internal Medicine

## 2018-10-16 ENCOUNTER — Encounter: Payer: Self-pay | Admitting: Internal Medicine

## 2018-10-16 ENCOUNTER — Other Ambulatory Visit: Payer: Self-pay

## 2018-10-16 VITALS — BP 120/68 | HR 65 | Temp 98.0°F | Ht 63.0 in | Wt 199.0 lb

## 2018-10-16 DIAGNOSIS — R21 Rash and other nonspecific skin eruption: Secondary | ICD-10-CM | POA: Diagnosis not present

## 2018-10-16 DIAGNOSIS — G43109 Migraine with aura, not intractable, without status migrainosus: Secondary | ICD-10-CM | POA: Diagnosis not present

## 2018-10-16 DIAGNOSIS — K5904 Chronic idiopathic constipation: Secondary | ICD-10-CM | POA: Diagnosis not present

## 2018-10-16 DIAGNOSIS — I10 Essential (primary) hypertension: Secondary | ICD-10-CM

## 2018-10-16 DIAGNOSIS — Z23 Encounter for immunization: Secondary | ICD-10-CM

## 2018-10-16 DIAGNOSIS — K5900 Constipation, unspecified: Secondary | ICD-10-CM | POA: Insufficient documentation

## 2018-10-16 MED ORDER — TRIAMCINOLONE ACETONIDE 0.1 % EX CREA: 1.0000 "application " | TOPICAL_CREAM | Freq: Two times a day (BID) | CUTANEOUS | 2 refills | Status: DC

## 2018-10-16 NOTE — Progress Notes (Signed)
   Subjective:   Patient ID: Diana Hendrix, female    DOB: Oct 03, 1945, 73 y.o.   MRN: [id number]  HPI The patient is a 73 YO female coming in for concerns about rash on her back (saw dermatologist many years ago, used some cream for awhile which helped significantly, was told it was dry skin, does not use lotion as she cannot reach well consistently), and constipation (going on years, has been using miralax for some time or fiber, she states one thing and then the next interchangably during exchange, denies current problems, feels that the fiber gives her gas, wants to try something different, has hemorrhoids which are active when she is having hard stools, up to date on colonoscopy), and blood pressure (BP at goal, denies side effects with her amlodipine and olmesartan/hctz and metoprolol, denies chest pains or headaches) as well as opthalmolic  migraines (with season change, for 2-3 days and then gone for months, has not tried allergy medicine, takes tylenol which helps for a short time only, the headache part is mild, has vision change/loss for about 30 minutes, talked to her eye specialist and they could not find a problem, has had for years and stable overall).   Review of Systems  Constitutional: Negative.   HENT: Negative.   Eyes: Positive for visual disturbance.  Respiratory: Negative for cough, chest tightness and shortness of breath.   Cardiovascular: Negative for chest pain, palpitations and leg swelling.  Gastrointestinal: Positive for constipation. Negative for abdominal distention, abdominal pain, diarrhea, nausea and vomiting.  Musculoskeletal: Negative.   Skin: Positive for rash.  Neurological: Positive for headaches.  Psychiatric/Behavioral: Negative.     Objective:  Physical Exam Constitutional:      Appearance: She is well-developed.  HENT:     Head: Normocephalic and atraumatic.  Neck:     Musculoskeletal: Normal range of motion.  Cardiovascular:     Rate and  Rhythm: Normal rate and regular rhythm.  Pulmonary:     Effort: Pulmonary effort is normal. No respiratory distress.     Breath sounds: Normal breath sounds. No wheezing or rales.  Abdominal:     General: Bowel sounds are normal. There is no distension.     Palpations: Abdomen is soft.     Tenderness: There is no abdominal tenderness. There is no rebound.  Musculoskeletal:        General: No tenderness.  Skin:    General: Skin is warm and dry.     Comments: Mild rash with dry skin on the mid back without stigmata of scratching  Neurological:     Mental Status: She is alert and oriented to person, place, and time.     Coordination: Coordination normal.     Vitals:   10/16/18 0810  BP: 120/68  Pulse: 65  Temp: 98 F (36.7 C)  TempSrc: Oral  SpO2: 97%  Weight: 199 lb (90.3 kg)  Height: 5\' 3"  (1.6 m)    Assessment & Plan:  Flu shot given at visit  Visit time 40 minutes: greater than 50% of that time was spent in face to face counseling and coordination of care with the patient: counseled about the above problems and concerns in great detail regarding migraines, constipation, rash, overall health, covid-19 counseling

## 2018-10-16 NOTE — Patient Instructions (Signed)
For the bowels you can try senokot-d 1 pill twice a day (maximum dose 2 pills twice a day) as needed for constipation.   We have sent in the cream for the back.

## 2018-10-16 NOTE — Assessment & Plan Note (Signed)
Rx triamcinolone until healed then lotion regularly.

## 2018-10-16 NOTE — Assessment & Plan Note (Signed)
Advised to try zyrtec around season change to see if this could have allergic component. Taking tylenol for headache portion with good control.

## 2018-10-16 NOTE — Assessment & Plan Note (Signed)
BP at goal on amlodipine, olmesartan/hctz and metoprolol. Recent labs at goal so no labs today.

## 2018-10-16 NOTE — Assessment & Plan Note (Signed)
Advised to try senokot-d 1-2 pills twice a day for constipation. Colonoscopy up to date without findings.

## 2018-10-22 DIAGNOSIS — D649 Anemia, unspecified: Secondary | ICD-10-CM | POA: Diagnosis not present

## 2018-10-22 DIAGNOSIS — M109 Gout, unspecified: Secondary | ICD-10-CM | POA: Diagnosis not present

## 2018-10-22 DIAGNOSIS — E119 Type 2 diabetes mellitus without complications: Secondary | ICD-10-CM | POA: Diagnosis not present

## 2018-10-22 DIAGNOSIS — N189 Chronic kidney disease, unspecified: Secondary | ICD-10-CM | POA: Diagnosis not present

## 2018-10-22 DIAGNOSIS — R809 Proteinuria, unspecified: Secondary | ICD-10-CM | POA: Diagnosis not present

## 2018-10-22 DIAGNOSIS — K5900 Constipation, unspecified: Secondary | ICD-10-CM | POA: Diagnosis not present

## 2018-10-22 DIAGNOSIS — I10 Essential (primary) hypertension: Secondary | ICD-10-CM | POA: Diagnosis not present

## 2018-11-15 DIAGNOSIS — H04123 Dry eye syndrome of bilateral lacrimal glands: Secondary | ICD-10-CM | POA: Diagnosis not present

## 2018-11-15 DIAGNOSIS — H0015 Chalazion left lower eyelid: Secondary | ICD-10-CM | POA: Diagnosis not present

## 2018-11-24 ENCOUNTER — Other Ambulatory Visit: Payer: Self-pay | Admitting: Internal Medicine

## 2018-12-14 ENCOUNTER — Other Ambulatory Visit: Payer: Self-pay | Admitting: Internal Medicine

## 2018-12-21 ENCOUNTER — Other Ambulatory Visit: Payer: Self-pay | Admitting: Internal Medicine

## 2018-12-26 ENCOUNTER — Encounter: Payer: Self-pay | Admitting: Gastroenterology

## 2019-01-11 ENCOUNTER — Other Ambulatory Visit: Payer: Self-pay | Admitting: Internal Medicine

## 2019-01-23 ENCOUNTER — Encounter: Payer: Self-pay | Admitting: Gastroenterology

## 2019-01-28 DIAGNOSIS — Z1231 Encounter for screening mammogram for malignant neoplasm of breast: Secondary | ICD-10-CM | POA: Diagnosis not present

## 2019-01-28 LAB — HM MAMMOGRAPHY

## 2019-02-04 ENCOUNTER — Ambulatory Visit: Payer: Medicare Other

## 2019-02-10 ENCOUNTER — Encounter: Payer: Self-pay | Admitting: Gastroenterology

## 2019-02-10 ENCOUNTER — Other Ambulatory Visit: Payer: Self-pay

## 2019-02-10 ENCOUNTER — Ambulatory Visit (INDEPENDENT_AMBULATORY_CARE_PROVIDER_SITE_OTHER): Payer: Medicare Other | Admitting: Gastroenterology

## 2019-02-10 VITALS — BP 124/70 | HR 66 | Temp 97.1°F | Ht 63.0 in | Wt 205.1 lb

## 2019-02-10 DIAGNOSIS — Z01818 Encounter for other preprocedural examination: Secondary | ICD-10-CM

## 2019-02-10 DIAGNOSIS — Z86010 Personal history of colonic polyps: Secondary | ICD-10-CM | POA: Diagnosis not present

## 2019-02-10 DIAGNOSIS — K5900 Constipation, unspecified: Secondary | ICD-10-CM | POA: Diagnosis not present

## 2019-02-10 MED ORDER — CLENPIQ 10-3.5-12 MG-GM -GM/160ML PO SOLN: 1.0000 | Freq: Once | ORAL | 0 refills | Status: AC

## 2019-02-10 MED ORDER — LINACLOTIDE 290 MCG PO CAPS: 290.0000 ug | ORAL_CAPSULE | Freq: Every day | ORAL | 0 refills | Status: DC

## 2019-02-10 NOTE — Progress Notes (Signed)
Chief Complaint:   Referring Provider:  Hoyt Koch, *      ASSESSMENT AND PLAN;   #1. H/O polyps (TAs)  #2. Chronic constipation-likely exacerbated by meds including calcium channel blockers, decreased physical activity.  Plan: - Miralax17g po bid - Linzess 260mcg po qd (samples given) - Proceed with colonoscopy with 2 day prep. Discussed risks & benefits. (Risks including rare perforation req laparotomy, bleeding after bx/polypectomy req blood transfusion, rarely missing neoplasms, risks of anesthesia/sedation). Benefits outweigh the risks. Patient agrees to proceed. All the questions were answered. Consent forms given for review. -If any worsening of left lower quadrant abdominal pain, would proceed with CT Abdo/pelvis.    HPI:    Diana Hendrix (Cathy)is a 74 y.o. female  With long standing constipation x over 10 to 15 years.  Has gotten worse recently.  Has been taking MiraLAX once a day.  It "stopped working in between".  She took senna for some time which also did not work very well.  Switch back to MiraLAX.  Currently taking 17 g MiraLAX once a day and drinking plenty of water.  Having bowel movements at the frequency of 1/3-4 days.  No NSAIDs.   No melena or hematochezia.  Does have left lower quadrant abdominal discomfort which gets better with defecation.  Has abdominal bloating which gets better with defecation as well.  Has been on for blood pressure medications including amlodipine.  Blood pressure previously has been difficult to control.  She is doing much better currently.  No upper GI symptoms including nausea, vomiting, heartburn, odynophagia or dysphagia.  No fever chills night sweats.  No weight loss.  Mother had history of diverticulitis.  Past GI procedures: Colonoscopy 10/2013: ([facility]) Fair prep, 6 mm polyp SP polypectomy, mild sigmoid diverticulosis.  Biopsies tubular adenoma. Past Medical History:  Diagnosis Date  . GERD  (gastroesophageal reflux disease)   . History of colon polyps   . Hyperlipidemia   . Hypertension   . Macular edema, cystoid   . OSA (obstructive sleep apnea) 09/17/2012  . Osteopenia     Past Surgical History:  Procedure Laterality Date  . CESAREAN SECTION    . COLONOSCOPY  10/29/2013   Colonic polyp status post polypectomy. Mild sigmoid diverticulosis. Small internal hemorrhoids  . FOOT NEUROMA SURGERY     s/p surgery in both feet  . MENISCUS REPAIR Right 09/13/2016  . SHOULDER SURGERY    . TUBAL LIGATION      Family History  Problem Relation Age of Onset  . Hypertension Mother   . Atrial fibrillation Mother   . Diabetes Mother   . Colon cancer Neg Hx   . Esophageal cancer Neg Hx     Social History   Tobacco Use  . Smoking status: Never Smoker  . Smokeless tobacco: Never Used  Substance Use Topics  . Alcohol use: No  . Drug use: No    Current Outpatient Medications  Medication Sig Dispense Refill  . amitriptyline (ELAVIL) 10 MG tablet TAKE 2 TABLETS(20 MG) BY MOUTH AT BEDTIME 180 tablet 1  . amLODipine (NORVASC) 5 MG tablet Take 5 mg by mouth every evening.     . Cholecalciferol (VITAMIN D-3) 1000 UNITS CAPS Take 1 capsule by mouth daily.     . Cholecalciferol (VITAMIN D3) 50 MCG (2000 UT) TABS Take 1 tablet by mouth daily.    . citalopram (CELEXA) 10 MG tablet TAKE 1 TABLET(10 MG) BY MOUTH DAILY 90 tablet 1  .  Coenzyme Q10 (COQ10 PO) Take 1 tablet by mouth every morning.    . metoprolol succinate (TOPROL-XL) 50 MG 24 hr tablet Take 75 mg by mouth daily. Take with or immediately following a meal.     . olmesartan-hydrochlorothiazide (BENICAR HCT) 40-12.5 MG per tablet Take 1 tablet by mouth daily.    . pantoprazole (PROTONIX) 40 MG tablet TAKE 1 TABLET(40 MG) BY MOUTH DAILY 90 tablet 1  . Polyethyl Glycol-Propyl Glycol (SYSTANE OP) Place 1 drop into both eyes 4 (four) times daily.    . polyethylene glycol powder (GLYCOLAX/MIRALAX) powder MIX 1 CAPFUL IN 8 OUNCES OF  WATER AND DRINK ONCE A DAY 527 g 3  . Probiotic Product (PROBIOTIC DAILY PO) Take 1 tablet by mouth daily.    . simvastatin (ZOCOR) 10 MG tablet TAKE 1 TABLET(10 MG) BY MOUTH AT BEDTIME 90 tablet 1  . triamcinolone cream (KENALOG) 0.1 % Apply 1 application topically 2 (two) times daily. 100 g 2  . TURMERIC PO Take 500 mg by mouth daily.     No current facility-administered medications for this visit.    Allergies  Allergen Reactions  . Cortisone Other (See Comments)    seizure   . Glucosamine Forte [Nutritional Supplements] Nausea And Vomiting    Review of Systems:  Constitutional: Denies fever, chills, diaphoresis, appetite change and fatigue.  HEENT: Denies photophobia, eye pain, redness, hearing loss, ear pain, congestion, sore throat, rhinorrhea, sneezing, mouth sores, neck pain, neck stiffness and tinnitus.   Respiratory: Denies SOB, DOE, cough, chest tightness,  and wheezing.   Cardiovascular: Denies chest pain, palpitations and leg swelling.  Genitourinary: Denies dysuria, urgency, frequency, hematuria, flank pain and difficulty urinating.  Musculoskeletal: has myalgias, back pain, joint swelling, arthralgias and gait problem.  Skin: No rash.  Neurological: Denies dizziness, seizures, syncope, weakness, light-headedness, numbness and headaches.  Hematological: Denies adenopathy. Easy bruising, personal or family bleeding history  Psychiatric/Behavioral: Has anxiety or depression     Physical Exam:    BP 124/70   Pulse 66   Temp (!) 97.1 F (36.2 C) (Temporal)   Ht 5\' 3"  (1.6 m)   Wt 205 lb 2 oz (93 kg)   BMI 36.34 kg/m  Wt Readings from Last 3 Encounters:  02/10/19 205 lb 2 oz (93 kg)  10/16/18 199 lb (90.3 kg)  12/07/17 200 lb 3.2 oz (90.8 kg)   Constitutional:  Well-developed, in no acute distress. Psychiatric: Normal mood and affect. Behavior is normal. HEENT: Pupils normal.  Conjunctivae are normal. No scleral icterus. Neck supple.  Cardiovascular: Normal  rate, regular rhythm. No edema Pulmonary/chest: Effort normal and breath sounds normal. No wheezing, rales or rhonchi. Abdominal: Soft, nondistended. Nontender. Bowel sounds active throughout. There are no masses palpable. No hepatomegaly. Rectal:  defered Neurological: Alert and oriented to person place and time. Skin: Skin is warm and dry. No rashes noted.  Data Reviewed: I have personally reviewed following labs and imaging studies  CBC: CBC Latest Ref Rng & Units 03/26/2018 04/17/2017 04/16/2017  WBC - 5.6 8.3 7.8  Hemoglobin 12.0 - 16.0 13.5 12.7 12.6  Hematocrit 36 - 46 40 36.7 37.8  Platelets 150 - 399 279 286 283    CMP: CMP Latest Ref Rng & Units 03/26/2018 04/17/2017 04/16/2017  Glucose 65 - 99 mg/dL - 156(H) -  BUN 4 - 21 19 10  -  Creatinine 0.5 - 1.1 0.9 0.90 0.99  Sodium 137 - 147 134(A) 134(L) -  Potassium 3.4 - 5.3 4.0 3.7 -  Chloride 101 - 111 mmol/L - 102 -  CO2 22 - 32 mmol/L - 20(L) -  Calcium 8.9 - 10.3 mg/dL - 8.8(L) -  Total Protein 6.5 - 8.1 g/dL - - 6.8  Total Bilirubin 0.3 - 1.2 mg/dL - - 0.7  Alkaline Phos 25 - 125 93 - 81  AST 13 - 35 18 - 33  ALT 7 - 35 11 - 18  Have reviewed previous labs.  Previously TSH was normal.   Carmell Austria, MD 02/10/2019, 9:08 AM  Cc: Hoyt Koch, *

## 2019-02-10 NOTE — Patient Instructions (Addendum)
If you are age 73 or older, your body mass index should be between 23-30. Your Body mass index is 36.34 kg/m. If this is out of the aforementioned range listed, please consider follow up with your Primary Care Provider.  If you are age 64 or younger, your body mass index should be between 19-25. Your Body mass index is 36.34 kg/m. If this is out of the aformentioned range listed, please consider follow up with your Primary Care Provider.   We have given you samples of the following medication to take: Clenpiq Linzess  You have been scheduled for a colonoscopy. Please follow written instructions given to you at your visit today.  Please pick up your prep supplies at the pharmacy within the next 1-3 days. If you use inhalers (even only as needed), please bring them with you on the day of your procedure. Your physician has requested that you go to www.startemmi.com and enter the access code given to you at your visit today. This web site gives a general overview about your procedure. However, you should still follow specific instructions given to you by our office regarding your preparation for the procedure.   Two days before your procedure: Mix 3 packs (or capfuls) of Miralax in 48 ounces of clear liquid and drink at 6pm.  Thank you,  Dr. Jackquline Denmark

## 2019-02-14 DIAGNOSIS — Z961 Presence of intraocular lens: Secondary | ICD-10-CM | POA: Diagnosis not present

## 2019-02-14 DIAGNOSIS — H5203 Hypermetropia, bilateral: Secondary | ICD-10-CM | POA: Diagnosis not present

## 2019-02-14 DIAGNOSIS — H04123 Dry eye syndrome of bilateral lacrimal glands: Secondary | ICD-10-CM | POA: Diagnosis not present

## 2019-02-21 ENCOUNTER — Ambulatory Visit (INDEPENDENT_AMBULATORY_CARE_PROVIDER_SITE_OTHER): Payer: Medicare Other

## 2019-02-21 ENCOUNTER — Other Ambulatory Visit: Payer: Self-pay | Admitting: Gastroenterology

## 2019-02-21 DIAGNOSIS — Z1159 Encounter for screening for other viral diseases: Secondary | ICD-10-CM | POA: Diagnosis not present

## 2019-02-22 LAB — SARS CORONAVIRUS 2 (TAT 6-24 HRS): SARS Coronavirus 2: NEGATIVE

## 2019-02-24 ENCOUNTER — Ambulatory Visit (AMBULATORY_SURGERY_CENTER): Payer: Medicare Other | Admitting: Gastroenterology

## 2019-02-24 ENCOUNTER — Other Ambulatory Visit: Payer: Self-pay

## 2019-02-24 ENCOUNTER — Encounter: Payer: Self-pay | Admitting: Gastroenterology

## 2019-02-24 VITALS — BP 124/88 | HR 61 | Temp 96.8°F | Resp 19 | Ht 63.0 in | Wt 205.0 lb

## 2019-02-24 DIAGNOSIS — K5900 Constipation, unspecified: Secondary | ICD-10-CM

## 2019-02-24 DIAGNOSIS — Z860100 Personal history of colon polyps, unspecified: Secondary | ICD-10-CM

## 2019-02-24 DIAGNOSIS — I10 Essential (primary) hypertension: Secondary | ICD-10-CM | POA: Diagnosis not present

## 2019-02-24 DIAGNOSIS — G4733 Obstructive sleep apnea (adult) (pediatric): Secondary | ICD-10-CM | POA: Diagnosis not present

## 2019-02-24 MED ORDER — SODIUM CHLORIDE 0.9 % IV SOLN: 500.0000 mL | Freq: Once | INTRAVENOUS | Status: DC

## 2019-02-24 MED ORDER — FLEET ENEMA 7-19 GM/118ML RE ENEM
1.0000 | ENEMA | Freq: Once | RECTAL | Status: AC
  Administered 2019-02-24: 08:00:00 1 via RECTAL

## 2019-02-24 NOTE — Patient Instructions (Signed)
Handouts on high fiber diet, diverticulosis, and hemorrhoids given to you today  High fiber diet recommended    YOU HAD AN ENDOSCOPIC PROCEDURE TODAY AT [facility]:   Refer to the procedure report that was given to you for any specific questions about what was found during the examination.  If the procedure report does not answer your questions, please call your gastroenterologist to clarify.  If you requested that your care partner not be given the details of your procedure findings, then the procedure report has been included in a sealed envelope for you to review at your convenience later.  YOU SHOULD EXPECT: Some feelings of bloating in the abdomen. Passage of more gas than usual.  Walking can help get rid of the air that was put into your GI tract during the procedure and reduce the bloating. If you had a lower endoscopy (such as a colonoscopy or flexible sigmoidoscopy) you may notice spotting of blood in your stool or on the toilet paper. If you underwent a bowel prep for your procedure, you may not have a normal bowel movement for a few days.  Please Note:  You might notice some irritation and congestion in your nose or some drainage.  This is from the oxygen used during your procedure.  There is no need for concern and it should clear up in a day or so.  SYMPTOMS TO REPORT IMMEDIATELY:   Following lower endoscopy (colonoscopy or flexible sigmoidoscopy):  Excessive amounts of blood in the stool  Significant tenderness or worsening of abdominal pains  Swelling of the abdomen that is new, acute  Fever of 100F or higher  For urgent or emergent issues, a gastroenterologist can be reached at any hour by calling [phone].   DIET:  We do recommend a small meal at first, but then you may proceed to your regular diet.  Drink plenty of fluids but you should avoid alcoholic beverages for 24 hours.  ACTIVITY:  You should plan to take it easy for the rest of today and you  should NOT DRIVE or use heavy machinery until tomorrow (because of the sedation medicines used during the test).    FOLLOW UP: Our staff will call the number listed on your records 48-72 hours following your procedure to check on you and address any questions or concerns that you may have regarding the information given to you following your procedure. If we do not reach you, we will leave a message.  We will attempt to reach you two times.  During this call, we will ask if you have developed any symptoms of COVID 19. If you develop any symptoms (ie: fever, flu-like symptoms, shortness of breath, cough etc.) before then, please call [phone].  If you test positive for Covid 19 in the 2 weeks post procedure, please call and report this information to [place].    If any biopsies were taken you will be contacted by phone or by letter within the next 1-3 weeks.  Please call us at [phone] if you have not heard about the biopsies in 3 weeks.    SIGNATURES/CONFIDENTIALITY: You and/or your care partner have signed paperwork which will be entered into your electronic medical record.  These signatures attest to the fact that that the information above on your After Visit Summary has been reviewed and is understood.  Full responsibility of the confidentiality of this discharge information lies with you and/or your care-partner.

## 2019-02-24 NOTE — Progress Notes (Signed)
Vitals-DT Temp-JB  History review. Patient needed an enema due to brown stool with particles.  Dr Telford Nab an enema to clean out the colon.  Fleets results: clear with some sludge noted.

## 2019-02-24 NOTE — Progress Notes (Signed)
Report given to PACU, vss 

## 2019-02-24 NOTE — Op Note (Signed)
[facility] Patient Name: Diana Hendrix Procedure Date: 02/24/2019 8:42 AM MRN: [id number] Endoscopist: Jackquline Denmark , MD Age: 74 Referring MD:  Date of Birth: 1945-05-08 Gender: Female Account #: [id number] Procedure:                Colonoscopy Indications:              High risk colon cancer surveillance: Personal                            history of colonic polyps Medicines:                Monitored Anesthesia Care Procedure:                Pre-Anesthesia Assessment:                           - Prior to the procedure, a History and Physical                            was performed, and patient medications and                            allergies were reviewed. The patient's tolerance of                            previous anesthesia was also reviewed. The risks                            and benefits of the procedure and the sedation                            options and risks were discussed with the patient.                            All questions were answered, and informed consent                            was obtained. Prior Anticoagulants: The patient has                            taken no previous anticoagulant or antiplatelet                            agents. ASA Grade Assessment: II - A patient with                            mild systemic disease. After reviewing the risks                            and benefits, the patient was deemed in                            satisfactory condition to undergo the procedure.  After obtaining informed consent, the colonoscope                            was passed under direct vision. Throughout the                            procedure, the patient's blood pressure, pulse, and                            oxygen saturations were monitored continuously. The                            Colonoscope was introduced through the anus and                            advanced to the 2 cm into the ileum. The                             colonoscopy was performed without difficulty. The                            patient tolerated the procedure well. The quality                            of the bowel preparation was adequate to identify                            polyps. Some adherent stool. Of note that she was                            also given an enema prior to colonoscopy.                            Aggressive suctioning and aspiration was performed.                            Overall the examination was adequate. The terminal                            ileum, ileocecal valve, appendiceal orifice, and                            rectum were photographed. Scope In: 8:45:18 AM Scope Out: 8:58:07 AM Scope Withdrawal Time: 0 hours 8 minutes 34 seconds  Total Procedure Duration: 0 hours 12 minutes 49 seconds  Findings:                 A few small-mouthed diverticula were found in the                            sigmoid colon.                           Non-bleeding internal hemorrhoids were found during  retroflexion. The hemorrhoids were small.                           The terminal ileum appeared normal.                           The exam was otherwise without abnormality on                            direct and retroflexion views. The colon was                            redundant. Complications:            No immediate complications. Estimated Blood Loss:     Estimated blood loss: none. Impression:               -Mild sigmoid diverticulosis.                           -Otherwise normal colonoscopy to TI. The colon was                            redundant. Recommendation:           - Patient has a contact number available for                            emergencies. The signs and symptoms of potential                            delayed complications were discussed with the                            patient. Return to normal activities tomorrow.                             Written discharge instructions were provided to the                            patient.                           - High fiber diet.                           - Continue present medications including MiraLAX.                           - Repeat screening colonoscopy only if with any new                            problems or change in family history.                           - Return to GI office in 12 weeks. Jackquline Denmark, MD 02/24/2019 9:03:40 AM This report has been signed electronically.

## 2019-02-25 ENCOUNTER — Telehealth: Payer: Self-pay | Admitting: Gastroenterology

## 2019-02-25 MED ORDER — LINACLOTIDE 290 MCG PO CAPS: 290.0000 ug | ORAL_CAPSULE | Freq: Every day | ORAL | 11 refills | Status: DC

## 2019-02-25 NOTE — Telephone Encounter (Signed)
I have sent prescription to patient's pharmacy.  

## 2019-02-26 ENCOUNTER — Telehealth: Payer: Self-pay

## 2019-02-26 ENCOUNTER — Telehealth: Payer: Self-pay | Admitting: Gastroenterology

## 2019-02-26 NOTE — Telephone Encounter (Signed)
  Follow up Call-  Call back number 02/24/2019  Post procedure Call Back phone  # [phone]  Permission to leave phone message Yes  Some recent data might be hidden     Patient questions:  Do you have a fever, pain , or abdominal swelling? No. Pain Score  0 *  Have you tolerated food without any problems? Yes.    Have you been able to return to your normal activities? Yes.    Do you have any questions about your discharge instructions: Diet   No. Medications  No. Follow up visit  No.  Do you have questions or concerns about your Care? No.  Actions: * If pain score is 4 or above: No action needed, pain <4.   1. Have you developed a fever since your procedure? No  2.   Have you had an respiratory symptoms (SOB or cough) since your procedure? No  3.   Have you tested positive for COVID 19 since your procedure? No  4.   Have you had any family members/close contacts diagnosed with the COVID 19 since your procedure?  No   If yes to any of these questions please route to Joylene John, RN and Alphonsa Gin, RN.

## 2019-02-27 NOTE — Telephone Encounter (Signed)
Patient called in and sent Mychart message to Dr. Lyndel Safe- she wants to try the Linzess- states that it was expensive cause she was having to meet her deductible. Once she does that she will just have to pay a copay for the medication so she is okay taking it.

## 2019-02-27 NOTE — Telephone Encounter (Signed)
Please call in Linzess 290 mcg p.o. once a day, 30, 6 refills RG

## 2019-02-27 NOTE — Telephone Encounter (Signed)
Please see message below

## 2019-02-28 DIAGNOSIS — Z23 Encounter for immunization: Secondary | ICD-10-CM | POA: Diagnosis not present

## 2019-02-28 MED ORDER — LINACLOTIDE 290 MCG PO CAPS: 290.0000 ug | ORAL_CAPSULE | Freq: Every day | ORAL | 6 refills | Status: DC

## 2019-02-28 NOTE — Telephone Encounter (Signed)
Sent prescription to patients pharmacy.  

## 2019-03-26 ENCOUNTER — Telehealth: Payer: Self-pay | Admitting: Pulmonary Disease

## 2019-03-26 DIAGNOSIS — G4733 Obstructive sleep apnea (adult) (pediatric): Secondary | ICD-10-CM

## 2019-03-26 NOTE — Telephone Encounter (Signed)
Called and spoke with the pt  She is c/o too much pressure coming from CPAP  She has already tried 2 different masks  C/o dry mouth  She states "feels like a tornado going on in there"  She wants to see about lowering the pressure  I printed a DL and placed in your lookat for review  Please advise thanks!

## 2019-03-28 DIAGNOSIS — Z23 Encounter for immunization: Secondary | ICD-10-CM | POA: Diagnosis not present

## 2019-03-30 NOTE — Telephone Encounter (Signed)
I haven't received download.

## 2019-03-31 NOTE — Telephone Encounter (Signed)
Download has been taken from [place] folder in C pod and placed on VS' desk upstairs.

## 2019-04-01 NOTE — Telephone Encounter (Signed)
Auto CPAP 02/24/19 to 03/25/19 >> used on 30 of 30 nights with average 8 hrs 38 min.  Average AHI 1.2 with median CPAP 10 and 95 th percentile CPAP 12 cm H2O.   Please send order to change auto CPAP range to 5 to 9 cm H2O.  She should call back if pressure setting still feels too high, and can decrease setting further.

## 2019-04-01 NOTE — Telephone Encounter (Signed)
I spoke with the pt and notified of response per VS  Order sent to [facility]

## 2019-04-01 NOTE — Telephone Encounter (Signed)
Pt called back regarding this--please return call.

## 2019-04-15 ENCOUNTER — Ambulatory Visit: Payer: Medicare Other | Admitting: Internal Medicine

## 2019-04-16 ENCOUNTER — Ambulatory Visit: Payer: Medicare Other | Admitting: Internal Medicine

## 2019-04-17 ENCOUNTER — Ambulatory Visit: Payer: Medicare Other | Admitting: Internal Medicine

## 2019-04-24 ENCOUNTER — Other Ambulatory Visit: Payer: Self-pay

## 2019-04-24 ENCOUNTER — Ambulatory Visit (INDEPENDENT_AMBULATORY_CARE_PROVIDER_SITE_OTHER): Payer: Medicare Other | Admitting: Internal Medicine

## 2019-04-24 ENCOUNTER — Encounter: Payer: Self-pay | Admitting: Internal Medicine

## 2019-04-24 VITALS — BP 132/84 | HR 62 | Temp 98.9°F | Ht 63.0 in | Wt 203.1 lb

## 2019-04-24 DIAGNOSIS — R0789 Other chest pain: Secondary | ICD-10-CM | POA: Diagnosis not present

## 2019-04-24 DIAGNOSIS — E782 Mixed hyperlipidemia: Secondary | ICD-10-CM

## 2019-04-24 DIAGNOSIS — R7301 Impaired fasting glucose: Secondary | ICD-10-CM | POA: Diagnosis not present

## 2019-04-24 DIAGNOSIS — I10 Essential (primary) hypertension: Secondary | ICD-10-CM

## 2019-04-24 DIAGNOSIS — R0602 Shortness of breath: Secondary | ICD-10-CM

## 2019-04-24 LAB — HEMOGLOBIN A1C: Hgb A1c MFr Bld: 6 % (ref 4.6–6.5)

## 2019-04-24 LAB — CBC
HCT: 38.7 % (ref 36.0–46.0)
Hemoglobin: 13.4 g/dL (ref 12.0–15.0)
MCHC: 34.6 g/dL (ref 30.0–36.0)
MCV: 88.8 fl (ref 78.0–100.0)
Platelets: 268 10*3/uL (ref 150.0–400.0)
RBC: 4.36 Mil/uL (ref 3.87–5.11)
RDW: 13.3 % (ref 11.5–15.5)
WBC: 5.8 10*3/uL (ref 4.0–10.5)

## 2019-04-24 LAB — COMPREHENSIVE METABOLIC PANEL
ALT: 13 U/L (ref 0–35)
AST: 18 U/L (ref 0–37)
Albumin: 4.5 g/dL (ref 3.5–5.2)
Alkaline Phosphatase: 88 U/L (ref 39–117)
BUN: 16 mg/dL (ref 6–23)
CO2: 29 mEq/L (ref 19–32)
Calcium: 9.8 mg/dL (ref 8.4–10.5)
Chloride: 99 mEq/L (ref 96–112)
Creatinine, Ser: 0.98 mg/dL (ref 0.40–1.20)
GFR: 55.67 mL/min — ABNORMAL LOW (ref >60.00)
Glucose, Bld: 107 mg/dL — ABNORMAL HIGH (ref 70–99)
Potassium: 4 mEq/L (ref 3.5–5.1)
Sodium: 135 mEq/L (ref 135–145)
Total Bilirubin: 0.6 mg/dL (ref 0.2–1.2)
Total Protein: 7.6 g/dL (ref 6.0–8.3)

## 2019-04-24 LAB — LIPID PANEL
Cholesterol: 182 mg/dL (ref 0–200)
HDL: 50.5 mg/dL (ref >39.00)
LDL Cholesterol: 111 mg/dL — ABNORMAL HIGH (ref 0–99)
NonHDL: 131.68
Total CHOL/HDL Ratio: 4
Triglycerides: 103 mg/dL (ref 0.0–149.0)
VLDL: 20.6 mg/dL (ref 0.0–40.0)

## 2019-04-24 LAB — BRAIN NATRIURETIC PEPTIDE: Pro B Natriuretic peptide (BNP): 26 pg/mL (ref 0.0–100.0)

## 2019-04-24 NOTE — Progress Notes (Signed)
   Subjective:   Patient ID: Diana Hendrix, female    DOB: 1945/09/20, 74 y.o.   MRN: [id number]  HPI The patient is a 74 YO female coming in for follow up of impaired blood sugar (denies change in diet or weight recently, not really exercising lately) and blood pressure (taking metoprolol and olmesartan/hctz and amlodipine, denies side effects such as dizziness, denies high readings at home, complicated by ckd, denies chest pains or headaches) and hyperlipidemia (taking simvastatin daily, denies chest pains or stroke symptoms, denies side effects).   Review of Systems  Constitutional: Negative.   HENT: Negative.   Eyes: Negative.   Respiratory: Negative for cough, chest tightness and shortness of breath.   Cardiovascular: Negative for chest pain, palpitations and leg swelling.  Gastrointestinal: Negative for abdominal distention, abdominal pain, constipation, diarrhea, nausea and vomiting.  Musculoskeletal: Negative.   Skin: Negative.   Neurological: Negative.   Psychiatric/Behavioral: Negative.     Objective:  Physical Exam Constitutional:      Appearance: She is well-developed. She is obese.  HENT:     Head: Normocephalic and atraumatic.  Cardiovascular:     Rate and Rhythm: Normal rate and regular rhythm.  Pulmonary:     Effort: Pulmonary effort is normal. No respiratory distress.     Breath sounds: Normal breath sounds. No wheezing or rales.  Abdominal:     General: Bowel sounds are normal. There is no distension.     Palpations: Abdomen is soft.     Tenderness: There is no abdominal tenderness. There is no rebound.  Musculoskeletal:     Cervical back: Normal range of motion.  Skin:    General: Skin is warm and dry.  Neurological:     Mental Status: She is alert and oriented to person, place, and time.     Coordination: Coordination normal.     Vitals:   04/24/19 0809  BP: 132/84  Pulse: 62  Temp: 98.9 F (37.2 C)  TempSrc: Oral  SpO2: 97%  Weight: 203 lb 2  oz (92.1 kg)  Height: 5\' 3"  (1.6 m)    EKG: Rate 59, axis normal, interval normal, sinus brady, no st or t wave changes, no significant change when compared to 2019  This visit occurred during the SARS-CoV-2 public health emergency.  Safety protocols were in place, including screening questions prior to the visit, additional usage of staff PPE, and extensive cleaning of exam room while observing appropriate contact time as indicated for disinfecting solutions.   Assessment & Plan:

## 2019-04-24 NOTE — Patient Instructions (Addendum)
Please schedule a visit with the health coach for your wellness visit on April 28th or later for this year.   We have done the EKG today which looks normal. We will get you in with the cardiologist to check the heart.   We will check the labs today as well to make sure everything is the same.

## 2019-04-25 ENCOUNTER — Encounter: Payer: Self-pay | Admitting: Internal Medicine

## 2019-04-25 DIAGNOSIS — R0789 Other chest pain: Secondary | ICD-10-CM | POA: Insufficient documentation

## 2019-04-25 NOTE — Assessment & Plan Note (Signed)
Checking lipid panel and adjust simvastatin 10 mg daily as needed.

## 2019-04-25 NOTE — Assessment & Plan Note (Signed)
BP at goal on olmesartan/hctz, amlodipine, metoprolol. Checking CMP and adjust as needed.

## 2019-04-25 NOTE — Assessment & Plan Note (Signed)
Checking HgA1c and adjust as needed.  

## 2019-04-25 NOTE — Assessment & Plan Note (Signed)
Has happened 1-2 times in the last year but strong family history of CAD. EKG done today stable from prior. Referral to cardiology for consideration of stress testing.

## 2019-04-25 NOTE — Assessment & Plan Note (Signed)
>>  ASSESSMENT AND PLAN FOR IMPAIRED FASTING BLOOD SUGAR WRITTEN ON 04/25/2019  1:14 PM BY CRAWFORD, ELIZABETH A, MD  Checking HgA1c and adjust as needed.

## 2019-04-27 NOTE — Progress Notes (Signed)
Cardiology Office Note:    Date:  04/28/2019   ID:  Diana Hendrix, DOB [birth year], MRN [id number]  PCP:  Hoyt Koch, MD  Cardiologist:  Shirlee More, MD   Referring MD: Hoyt Koch, *  ASSESSMENT:    1. Chest tightness   2. Essential hypertension   3. Mixed hyperlipidemia   4. Precordial pain   5. Dyspnea on exertion    PLAN:    In order of problems listed above:  1. Further evaluation cardiac CTA to guide therapy continue antihypertensive medications and lipid-lowering with statin 2. Stable BP at target continue current treatment 3. Continue her statin 4. Despite absence of edema her symptoms are quite suggestive of heart failure and there is a phenotype associated with obesity with a low BNP check echocardiogram regarding EF diastolic filling and pulmonary pressures  Next appointment 6 weeks   Medication Adjustments/Labs and Tests Ordered: Current medicines are reviewed at length with the patient today.  Concerns regarding medicines are outlined above.  No orders of the defined types were placed in this encounter.  No orders of the defined types were placed in this encounter.    Chief Complaint  Patient presents with  . New Patient (Initial Visit)  . Chest Pain    History of Present Illness:    Diana Hendrix is a 74 y.o. female with hypertension and hyperlipidemia who is being seen today for the evaluation of chest pain at the request of Hoyt Koch, *.  She also has a history of GERD and has been seen by gastroenterology.  She has had difficult to control hypertension has been followed by nephrology.  She also has obstructive sleep apnea uses CPAP. A proBNP level was performed which was normal 27 An EKG was performed 04/24/2019 described as unremarkable but I am unable to review it with in epic.  Since Covid she has not felt well she has exercise intolerance shortness of breath with activity she attributes to her mask but also  short of breath in the mornings when she wakes up without a mass.  She notices dependent edema at the end of the day.  She had an episode when she was doing crafts where she had severe pain in her left chest it lasted for few minutes and she would stop and rest and it resolved there has been no recurrence.  Her 10-year risk of atherosclerotic cardiovascular disease is intermediate at 10% and after discussion of modalities for further evaluation will undergo cardiac CTA regarding CAD and echocardiogram regarding diastolic heart failure.  I will see her back in the office in 6 weeks.  She said as a youngster she had a murmur but it resolved during life she does have a strong family history of valvular heart disease in both her mother and father.  Her mother also had CAD and heart failure. Past Medical History:  Diagnosis Date  . GERD (gastroesophageal reflux disease)   . History of colon polyps   . Hyperlipidemia   . Hypertension   . Macular edema, cystoid   . OSA (obstructive sleep apnea) 09/17/2012  . Osteopenia     Past Surgical History:  Procedure Laterality Date  . CESAREAN SECTION    . COLONOSCOPY  10/29/2013   Colonic polyp status post polypectomy. Mild sigmoid diverticulosis. Small internal hemorrhoids  . FOOT NEUROMA SURGERY     s/p surgery in both feet  . MENISCUS REPAIR Right 09/13/2016  . SHOULDER SURGERY    .  TUBAL LIGATION      Current Medications: Current Meds  Medication Sig  . amitriptyline (ELAVIL) 10 MG tablet TAKE 2 TABLETS(20 MG) BY MOUTH AT BEDTIME  . amLODipine (NORVASC) 5 MG tablet Take 5 mg by mouth every evening.   . Cholecalciferol (VITAMIN D3) 50 MCG (2000 UT) TABS Take 1 tablet by mouth daily.  . citalopram (CELEXA) 10 MG tablet TAKE 1 TABLET(10 MG) BY MOUTH DAILY  . Coenzyme Q10 (COQ10 PO) Take 1 tablet by mouth every morning.  . linaclotide (LINZESS) 290 MCG CAPS capsule Take 1 capsule (290 mcg total) by mouth daily before breakfast.  . metoprolol succinate  (TOPROL-XL) 50 MG 24 hr tablet Take 75 mg by mouth daily. Take with or immediately following a meal.   . olmesartan-hydrochlorothiazide (BENICAR HCT) 40-12.5 MG per tablet Take 1 tablet by mouth daily.  . pantoprazole (PROTONIX) 40 MG tablet TAKE 1 TABLET(40 MG) BY MOUTH DAILY  . Polyethyl Glycol-Propyl Glycol (SYSTANE OP) Place 1 drop into both eyes 4 (four) times daily.  . polyethylene glycol powder (GLYCOLAX/MIRALAX) powder MIX 1 CAPFUL IN 8 OUNCES OF WATER AND DRINK ONCE A DAY  . Probiotic Product (PROBIOTIC DAILY PO) Take 1 tablet by mouth daily.  . simvastatin (ZOCOR) 10 MG tablet TAKE 1 TABLET(10 MG) BY MOUTH AT BEDTIME  . TURMERIC PO Take 500 mg by mouth daily.     Allergies:   Cortisone and Glucosamine forte [nutritional supplements]   Social History   Socioeconomic History  . Marital status: Married    Spouse name: Not on file  . Number of children: 2  . Years of education: Not on file  . Highest education level: Not on file  Occupational History  . Not on file  Tobacco Use  . Smoking status: Never Smoker  . Smokeless tobacco: Never Used  Substance and Sexual Activity  . Alcohol use: No  . Drug use: No  . Sexual activity: Not Currently  Other Topics Concern  . Not on file  Social History Narrative  . Not on file   Social Determinants of Health   Financial Resource Strain: Low Risk   . Difficulty of Paying Living Expenses: Not hard at all  Food Insecurity: No Food Insecurity  . Worried About Charity fundraiser in the Last Year: Never true  . Ran Out of Food in the Last Year: Never true  Transportation Needs: No Transportation Needs  . Lack of Transportation (Medical): No  . Lack of Transportation (Non-Medical): No  Physical Activity: Sufficiently Active  . Days of Exercise per Week: 5 days  . Minutes of Exercise per Session: 40 min  Stress:   . Feeling of Stress :   Social Connections: Not Isolated  . Frequency of Communication with Friends and Family: More  than three times a week  . Frequency of Social Gatherings with Friends and Family: More than three times a week  . Attends Religious Services: 1 to 4 times per year  . Active Member of Clubs or Organizations: Yes  . Attends Archivist Meetings: 1 to 4 times per year  . Marital Status: Married     Family History: The patient's family history includes Atrial fibrillation in her mother; Diabetes in her mother; Hypertension in her mother. There is no history of Colon cancer or Esophageal cancer.  ROS:   Review of Systems  Constitution: Positive for malaise/fatigue.  HENT: Negative.   Eyes: Negative.   Cardiovascular: Positive for chest pain, dyspnea on  exertion and leg swelling.  Respiratory: Positive for shortness of breath.   Endocrine: Negative.   Hematologic/Lymphatic: Negative.   Skin: Negative.   Musculoskeletal: Negative.   Gastrointestinal: Negative.   Genitourinary: Negative.   Neurological: Negative.   Psychiatric/Behavioral: Negative.   Allergic/Immunologic: Negative.    Please see the history of present illness.     All other systems reviewed and are negative.  EKGs/Labs/Other Studies Reviewed:    The following studies were reviewed today:   EKG:  EKG is  ordered today.  The ekg ordered today is personally reviewed and demonstrates sinus rhythm and is normal  Recent Labs: 04/24/2019: ALT 13; BUN 16; Creatinine, Ser 0.98; Hemoglobin 13.4; Platelets 268.0; Potassium 4.0; Pro B Natriuretic peptide (BNP) 26.0; Sodium 135  Recent Lipid Panel    Component Value Date/Time   CHOL 182 04/24/2019 0913   TRIG 103.0 04/24/2019 0913   HDL 50.50 04/24/2019 0913   CHOLHDL 4 04/24/2019 0913   VLDL 20.6 04/24/2019 0913   LDLCALC 111 (H) 04/24/2019 0913    Physical Exam:    VS:  BP 104/78   Pulse 67   Ht 5\' 3"  (1.6 m)   Wt 203 lb (92.1 kg)   SpO2 95%   BMI 35.96 kg/m     Wt Readings from Last 3 Encounters:  04/28/19 203 lb (92.1 kg)  04/24/19 203 lb 2 oz  (92.1 kg)  02/24/19 205 lb (93 kg)     GEN:  Well nourished, well developed in no acute distress HEENT: Normal NECK: No JVD; No carotid bruits LYMPHATICS: No lymphadenopathy CARDIAC: RRR, no murmurs, rubs, gallops RESPIRATORY:  Clear to auscultation without rales, wheezing or rhonchi  ABDOMEN: Soft, non-tender, non-distended MUSCULOSKELETAL:  No edema; No deformity  SKIN: Warm and dry NEUROLOGIC:  Alert and oriented x 3 PSYCHIATRIC:  Normal affect     Signed, Shirlee More, MD  04/28/2019 10:30 AM    [place]

## 2019-04-28 ENCOUNTER — Other Ambulatory Visit: Payer: Self-pay

## 2019-04-28 ENCOUNTER — Ambulatory Visit (INDEPENDENT_AMBULATORY_CARE_PROVIDER_SITE_OTHER): Payer: Medicare Other | Admitting: Cardiology

## 2019-04-28 ENCOUNTER — Encounter: Payer: Self-pay | Admitting: Cardiology

## 2019-04-28 VITALS — BP 104/78 | HR 67 | Ht 63.0 in | Wt 203.0 lb

## 2019-04-28 DIAGNOSIS — R072 Precordial pain: Secondary | ICD-10-CM | POA: Diagnosis not present

## 2019-04-28 DIAGNOSIS — I10 Essential (primary) hypertension: Secondary | ICD-10-CM | POA: Diagnosis not present

## 2019-04-28 DIAGNOSIS — R0600 Dyspnea, unspecified: Secondary | ICD-10-CM | POA: Diagnosis not present

## 2019-04-28 DIAGNOSIS — E782 Mixed hyperlipidemia: Secondary | ICD-10-CM

## 2019-04-28 DIAGNOSIS — R0789 Other chest pain: Secondary | ICD-10-CM

## 2019-04-28 DIAGNOSIS — R0609 Other forms of dyspnea: Secondary | ICD-10-CM

## 2019-04-28 MED ORDER — METOPROLOL TARTRATE 100 MG PO TABS: 100.0000 mg | ORAL_TABLET | Freq: Once | ORAL | 0 refills | Status: DC

## 2019-04-28 NOTE — Patient Instructions (Addendum)
Medication Instructions:  Your physician recommends that you continue on your current medications as directed. Please refer to the Current Medication list given to you today.  *If you need a refill on your cardiac medications before your next appointment, please call your pharmacy*   Lab Work: Your physician recommends that you return for lab work in: TODAY BMP If you have labs (blood work) drawn today and your tests are completely normal, you will receive your results only by: Marland Kitchen MyChart Message (if you have MyChart) OR . A paper copy in the mail If you have any lab test that is abnormal or we need to change your treatment, we will call you to review the results.   Testing/Procedures: Your physician has requested that you have an echocardiogram. Echocardiography is a painless test that uses sound waves to create images of your heart. It provides your doctor with information about the size and shape of your heart and how well your heart's chambers and valves are working. This procedure takes approximately one hour. There are no restrictions for this procedure.  Your cardiac CT will be scheduled at the below location:   [facility] [address] [place], [place] [address] [phone]   If scheduled at [facility], please arrive at the [facility] main entrance of [facility] 30 minutes prior to test start time. Proceed to the [facility] Radiology Department (first floor) to check-in and test prep.  Please follow these instructions carefully (unless otherwise directed):  On the Night Before the Test: . Be sure to Drink plenty of water. . Do not consume any caffeinated/decaffeinated beverages or chocolate 12 hours prior to your test. . Do not take any antihistamines 12 hours prior to your test.  On the Day of the Test: . Drink plenty of water. Do not drink any water within one hour of the test. . Do not eat any food 4 hours prior to the test. . You  may take your regular medications prior to the test.  . Take metoprolol two hours prior to test. . HOLD Hydrochlorothiazide morning of the test. . FEMALES- please wear underwire-free bra if available       After the Test: . Drink plenty of water. . After receiving IV contrast, you may experience a mild flushed feeling. This is normal. . On occasion, you may experience a mild rash up to 24 hours after the test. This is not dangerous. If this occurs, you can take Benadryl 25 mg and increase your fluid intake. . If you experience trouble breathing, this can be serious. If it is severe call 911 IMMEDIATELY. If it is mild, please call our office.  Once we have confirmed authorization from your insurance company, we will call you to set up a date and time for your test.   For non-scheduling related questions, please contact the cardiac imaging nurse navigator should you have any questions/concerns: Marchia Bond, RN Navigator Cardiac Imaging [facility] [phone] office  For scheduling needs, including cancellations and rescheduling, please call [phone].      Follow-Up: At [facility], you and your health needs are our priority.  As part of our continuing mission to provide you with exceptional heart care, we have created designated Provider Care Teams.  These Care Teams include your primary Cardiologist (physician) and Advanced Practice Providers (APPs -  Physician Assistants and Nurse Practitioners) who all work together to provide you with the care you need, when you  need it.  We recommend signing up for the patient portal called "MyChart".  Sign up information is provided on this After Visit Summary.  MyChart is used to connect with patients for Virtual Visits (Telemedicine).  Patients are able to view lab/test results, encounter notes, upcoming appointments, etc.  Non-urgent messages can be sent to your provider as well.   To learn more about what you  can do with MyChart, go to [email or web address].    Your next appointment:   6 week(s)  The format for your next appointment:   In Person  Provider:   Shirlee More, MD   Other Instructions

## 2019-04-29 ENCOUNTER — Telehealth: Payer: Self-pay

## 2019-04-29 LAB — BASIC METABOLIC PANEL
BUN/Creatinine Ratio: 14 (ref 12–28)
BUN: 14 mg/dL (ref 8–27)
CO2: 21 mmol/L (ref 20–29)
Calcium: 9.6 mg/dL (ref 8.7–10.3)
Chloride: 99 mmol/L (ref 96–106)
Creatinine, Ser: 1 mg/dL (ref 0.57–1.00)
GFR calc Af Amer: 65 mL/min/{1.73_m2} (ref >59)
GFR calc non Af Amer: 56 mL/min/{1.73_m2} — ABNORMAL LOW (ref >59)
Glucose: 95 mg/dL (ref 65–99)
Potassium: 4.3 mmol/L (ref 3.5–5.2)
Sodium: 137 mmol/L (ref 134–144)

## 2019-04-29 NOTE — Telephone Encounter (Signed)
Spoke with patient regarding results.  Patient verbalizes understanding and is agreeable to plan of care. Advised patient to call back with any issues or concerns.  

## 2019-04-29 NOTE — Telephone Encounter (Signed)
-----   Message from Richardo Priest, MD sent at 04/29/2019  7:58 AM EDT ----- Normal or stable result  For cardiac CTA

## 2019-04-30 ENCOUNTER — Other Ambulatory Visit: Payer: Self-pay

## 2019-04-30 ENCOUNTER — Telehealth: Payer: Self-pay

## 2019-04-30 ENCOUNTER — Ambulatory Visit (INDEPENDENT_AMBULATORY_CARE_PROVIDER_SITE_OTHER): Payer: Medicare Other

## 2019-04-30 DIAGNOSIS — R072 Precordial pain: Secondary | ICD-10-CM

## 2019-04-30 DIAGNOSIS — R0789 Other chest pain: Secondary | ICD-10-CM | POA: Diagnosis not present

## 2019-04-30 NOTE — Telephone Encounter (Signed)
-----   Message from Richardo Priest, MD sent at 04/30/2019  4:37 PM EDT ----- Normal or stable result  Good result reassuring test results are good this is reassuring I can discuss in detail

## 2019-04-30 NOTE — Telephone Encounter (Signed)
Spoke with patient regarding results and recommendation.  Patient verbalizes understanding and is agreeable to plan of care. Advised patient to call back with any issues or concerns.  

## 2019-04-30 NOTE — Progress Notes (Signed)
Complete echocardiogram performed.  Jimmy Stacee Earp RDCS, RVT  

## 2019-05-07 ENCOUNTER — Telehealth: Payer: Self-pay

## 2019-05-07 ENCOUNTER — Encounter: Payer: Self-pay | Admitting: *Deleted

## 2019-05-07 ENCOUNTER — Telehealth: Payer: Self-pay | Admitting: *Deleted

## 2019-05-07 NOTE — Telephone Encounter (Signed)
Patient given detailed instructions per Myocardial Perfusion Study Information Sheet for the test on 05/14/2019 at 0745. Patient notified to arrive 15 minutes early and that it is imperative to arrive on time for appointment to keep from having the test rescheduled.  If you need to cancel or reschedule your appointment, please call the office within 24 hours of your appointment. . Patient verbalized understanding.Diana Hendrix, [name] letter sent with instructions

## 2019-05-07 NOTE — Addendum Note (Signed)
Addended by: Resa Miner I on: 05/07/2019 02:58 PM   Modules accepted: Orders

## 2019-05-07 NOTE — Telephone Encounter (Signed)
Please arrive 15 minutes prior to your appointment time for registration and insurance purposes.  The test will take approximately 3 to 4 hours to complete; you may bring reading material.  If someone comes with you to your appointment, they will need to remain in the main lobby due to limited space in the testing area. **If you are pregnant or breastfeeding, please notify the nuclear lab prior to your appointment**  How to prepare for your Myocardial Perfusion Test: . Do not eat or drink 3 hours prior to your test, except you may have water. . Do not consume products containing caffeine (regular or decaffeinated) 12 hours prior to your test. (ex: coffee, chocolate, sodas, tea). . Do bring a list of your current medications with you.  If not listed below, you may take your medications as normal. . Do wear comfortable clothes (no dresses or overalls) and walking shoes, tennis shoes preferred (No heels or open toe shoes are allowed). . Do NOT wear cologne, perfume, aftershave, or lotions (deodorant is allowed). . If these instructions are not followed, your test will have to be rescheduled.  Please report to [address] for your test.  If you have questions or concerns about your appointment, you can call the [facility] at [phone].  If you cannot keep your appointment, please provide 24 hours notification to the Nuclear Lab, to avoid a possible $50 charge to your account.  Spoke with patient and let her know that we are going to cancel her CT scan per Dr. Joya Gaskins verbal order and will be ordering a stress test instead. I went over the above instructions with the patient and she verbalizes understanding.   Encouraged patient to call back with any questions or concerns.

## 2019-05-13 ENCOUNTER — Encounter: Payer: Self-pay | Admitting: Gastroenterology

## 2019-05-13 ENCOUNTER — Ambulatory Visit (INDEPENDENT_AMBULATORY_CARE_PROVIDER_SITE_OTHER): Payer: Medicare Other | Admitting: Gastroenterology

## 2019-05-13 ENCOUNTER — Other Ambulatory Visit: Payer: Self-pay

## 2019-05-13 VITALS — BP 118/72 | HR 64 | Temp 96.8°F | Ht 63.0 in | Wt 204.1 lb

## 2019-05-13 DIAGNOSIS — K5900 Constipation, unspecified: Secondary | ICD-10-CM

## 2019-05-13 NOTE — Progress Notes (Signed)
.    Chief Complaint: FU  Referring Provider:  Hoyt Koch, *      ASSESSMENT AND PLAN;   #1. Chronic constipation-likely exacerbated by meds including calcium channel blockers. Neg colon 02/2019. Much better.  Plan: - Miralax17g po QD to continue - Continue Linzess 234mcg po qd. She has enough. - FU as needed.    HPI:    Diana Hendrix (Cathy)is a 74 y.o. female   For FU Feels 100% better from GI standpoint.  Wants to hold off on any further evaluation at the present time.  I do agree.  Had negative colonoscopy in  Feb 2021.  No need to repeat unless new problems.  Has been having "heart running fast at times".  Evaluated by Dr. Bettina Gavia.  She is having cardiac stress test tomorrow.  No upper GI symptoms including nausea, vomiting, heartburn, odynophagia or dysphagia.  No fever chills night sweats.  No weight loss.  Mother had history of diverticulitis.  Past GI procedures: Colonoscopy 02/2019: Mild diverticulosis.  Otherwise normal.  10/2013: ([facility]) Fair prep, 6 mm polyp SP polypectomy, mild sigmoid diverticulosis.  Biopsies tubular adenoma. Past Medical History:  Diagnosis Date  . GERD (gastroesophageal reflux disease)   . History of colon polyps   . Hyperlipidemia   . Hypertension   . Macular edema, cystoid   . OSA (obstructive sleep apnea) 09/17/2012  . Osteopenia     Past Surgical History:  Procedure Laterality Date  . CESAREAN SECTION    . COLONOSCOPY  10/29/2013   Colonic polyp status post polypectomy. Mild sigmoid diverticulosis. Small internal hemorrhoids  . FOOT NEUROMA SURGERY     s/p surgery in both feet  . MENISCUS REPAIR Right 09/13/2016  . SHOULDER SURGERY    . TUBAL LIGATION      Family History  Problem Relation Age of Onset  . Hypertension Mother   . Atrial fibrillation Mother   . Diabetes Mother   . Colon cancer Neg Hx   . Esophageal cancer Neg Hx     Social History   Tobacco Use  . Smoking status: Never Smoker  .  Smokeless tobacco: Never Used  Substance Use Topics  . Alcohol use: No  . Drug use: No    Current Outpatient Medications  Medication Sig Dispense Refill  . amitriptyline (ELAVIL) 10 MG tablet TAKE 2 TABLETS(20 MG) BY MOUTH AT BEDTIME 180 tablet 1  . amLODipine (NORVASC) 5 MG tablet Take 5 mg by mouth every evening.     . Cholecalciferol (VITAMIN D3) 50 MCG (2000 UT) TABS Take 1 tablet by mouth daily.    . citalopram (CELEXA) 10 MG tablet TAKE 1 TABLET(10 MG) BY MOUTH DAILY 90 tablet 1  . Coenzyme Q10 (COQ10 PO) Take 1 tablet by mouth every morning.    . linaclotide (LINZESS) 290 MCG CAPS capsule Take 1 capsule (290 mcg total) by mouth daily before breakfast. 30 capsule 6  . metoprolol succinate (TOPROL-XL) 50 MG 24 hr tablet Take 75 mg by mouth daily. Take with or immediately following a meal.     . olmesartan-hydrochlorothiazide (BENICAR HCT) 40-12.5 MG per tablet Take 1 tablet by mouth daily.    . pantoprazole (PROTONIX) 40 MG tablet TAKE 1 TABLET(40 MG) BY MOUTH DAILY 90 tablet 1  . Polyethyl Glycol-Propyl Glycol (SYSTANE OP) Place 1 drop into both eyes 4 (four) times daily.    . polyethylene glycol powder (GLYCOLAX/MIRALAX) powder MIX 1 CAPFUL IN 8 OUNCES OF WATER AND DRINK ONCE  A DAY 527 g 3  . Probiotic Product (PROBIOTIC DAILY PO) Take 1 tablet by mouth daily.    . simvastatin (ZOCOR) 10 MG tablet TAKE 1 TABLET(10 MG) BY MOUTH AT BEDTIME 90 tablet 1  . TURMERIC PO Take 500 mg by mouth daily.     No current facility-administered medications for this visit.    Allergies  Allergen Reactions  . Cortisone Other (See Comments)    seizure   . Glucosamine Forte [Nutritional Supplements] Nausea And Vomiting    Review of Systems:  neg     Physical Exam:    BP 118/72   Pulse 64   Temp (!) 96.8 F (36 C)   Ht 5\' 3"  (1.6 m)   Wt 204 lb 2 oz (92.6 kg)   BMI 36.16 kg/m  Wt Readings from Last 3 Encounters:  05/13/19 204 lb 2 oz (92.6 kg)  04/28/19 203 lb (92.1 kg)  04/24/19  203 lb 2 oz (92.1 kg)   Constitutional:  Well-developed, in no acute distress. Psychiatric: Normal mood and affect. Behavior is normal. HEENT: Pupils normal.  Conjunctivae are normal. No scleral icterus. Neck supple.  Cardiovascular: Normal rate, regular rhythm. No edema Pulmonary/chest: Effort normal and breath sounds normal. No wheezing, rales or rhonchi. Abdominal: Soft, nondistended. Nontender. Bowel sounds active throughout. There are no masses palpable. No hepatomegaly. Rectal:  defered Neurological: Alert and oriented to person place and time. Skin: Skin is warm and dry. No rashes noted.  Data Reviewed: I have personally reviewed following labs and imaging studies  CBC: CBC Latest Ref Rng & Units 04/24/2019 03/26/2018 04/17/2017  WBC 4.0 - 10.5 K/uL 5.8 5.6 8.3  Hemoglobin 12.0 - 15.0 g/dL 13.4 13.5 12.7  Hematocrit 36.0 - 46.0 % 38.7 40 36.7  Platelets 150.0 - 400.0 K/uL 268.0 279 286    CMP: CMP Latest Ref Rng & Units 04/28/2019 04/24/2019 03/26/2018  Glucose 65 - 99 mg/dL 95 107(H) -  BUN 8 - 27 mg/dL 14 16 19   Creatinine 0.57 - 1.00 mg/dL 1.00 0.98 0.9  Sodium 134 - 144 mmol/L 137 135 134(A)  Potassium 3.5 - 5.2 mmol/L 4.3 4.0 4.0  Chloride 96 - 106 mmol/L 99 99 -  CO2 20 - 29 mmol/L 21 29 -  Calcium 8.7 - 10.3 mg/dL 9.6 9.8 -  Total Protein 6.0 - 8.3 g/dL - 7.6 -  Total Bilirubin 0.2 - 1.2 mg/dL - 0.6 -  Alkaline Phos 39 - 117 U/L - 88 93  AST 0 - 37 U/L - 18 18  ALT 0 - 35 U/L - 13 11  Have reviewed previous labs.  Previously TSH was normal.   Carmell Austria, MD 05/13/2019, 8:48 AM  Cc: Hoyt Koch, *

## 2019-05-13 NOTE — Patient Instructions (Signed)
If you are age 73 or older, your body mass index should be between 23-30. Your Body mass index is 36.16 kg/m. If this is out of the aforementioned range listed, please consider follow up with your Primary Care Provider.  If you are age 51 or younger, your body mass index should be between 19-25. Your Body mass index is 36.16 kg/m. If this is out of the aformentioned range listed, please consider follow up with your Primary Care Provider.   Follow up as needed.   Thank you,  Dr. Jackquline Denmark

## 2019-05-14 ENCOUNTER — Telehealth: Payer: Self-pay

## 2019-05-14 ENCOUNTER — Ambulatory Visit (INDEPENDENT_AMBULATORY_CARE_PROVIDER_SITE_OTHER): Payer: Medicare Other

## 2019-05-14 VITALS — Ht 63.0 in | Wt 203.0 lb

## 2019-05-14 DIAGNOSIS — R072 Precordial pain: Secondary | ICD-10-CM | POA: Diagnosis not present

## 2019-05-14 DIAGNOSIS — R0600 Dyspnea, unspecified: Secondary | ICD-10-CM | POA: Diagnosis not present

## 2019-05-14 DIAGNOSIS — R110 Nausea: Secondary | ICD-10-CM | POA: Diagnosis not present

## 2019-05-14 DIAGNOSIS — R0789 Other chest pain: Secondary | ICD-10-CM | POA: Diagnosis not present

## 2019-05-14 DIAGNOSIS — R0609 Other forms of dyspnea: Secondary | ICD-10-CM

## 2019-05-14 LAB — MYOCARDIAL PERFUSION IMAGING
LV dias vol: 63 mL (ref 46–106)
LV sys vol: 17 mL
Peak HR: 79 {beats}/min
Rest HR: 55 {beats}/min
SDS: 1
SRS: 3
SSS: 4
TID: 1.02

## 2019-05-14 MED ORDER — TECHNETIUM TC 99M TETROFOSMIN IV KIT
31.6000 | PACK | Freq: Once | INTRAVENOUS | Status: AC | PRN
  Administered 2019-05-14: 31.6 via INTRAVENOUS

## 2019-05-14 MED ORDER — REGADENOSON 0.4 MG/5ML IV SOLN
0.4000 mg | Freq: Once | INTRAVENOUS | Status: AC
  Administered 2019-05-14: 0.4 mg via INTRAVENOUS

## 2019-05-14 MED ORDER — AMINOPHYLLINE 25 MG/ML IV SOLN
150.0000 mg | Freq: Once | INTRAVENOUS | Status: AC
  Administered 2019-05-14: 150 mg via INTRAVENOUS

## 2019-05-14 MED ORDER — TECHNETIUM TC 99M TETROFOSMIN IV KIT
10.5000 | PACK | Freq: Once | INTRAVENOUS | Status: AC | PRN
  Administered 2019-05-14: 10.5 via INTRAVENOUS

## 2019-05-14 NOTE — Telephone Encounter (Signed)
-----   Message from Richardo Priest, MD sent at 05/14/2019  3:35 PM EDT ----- Normal or stable result

## 2019-05-14 NOTE — Telephone Encounter (Signed)
Spoke with patient regarding results.  Patient verbalizes understanding and is agreeable to plan of care. Advised patient to call back with any issues or concerns.  

## 2019-05-20 ENCOUNTER — Ambulatory Visit (INDEPENDENT_AMBULATORY_CARE_PROVIDER_SITE_OTHER): Payer: Medicare Other

## 2019-05-20 ENCOUNTER — Ambulatory Visit (INDEPENDENT_AMBULATORY_CARE_PROVIDER_SITE_OTHER): Payer: Medicare Other | Admitting: Pulmonary Disease

## 2019-05-20 ENCOUNTER — Other Ambulatory Visit: Payer: Self-pay

## 2019-05-20 ENCOUNTER — Encounter: Payer: Self-pay | Admitting: Pulmonary Disease

## 2019-05-20 VITALS — BP 118/70 | HR 67 | Temp 97.7°F | Ht 63.0 in | Wt 204.2 lb

## 2019-05-20 DIAGNOSIS — R0600 Dyspnea, unspecified: Secondary | ICD-10-CM | POA: Diagnosis not present

## 2019-05-20 DIAGNOSIS — G4730 Sleep apnea, unspecified: Secondary | ICD-10-CM | POA: Diagnosis not present

## 2019-05-20 DIAGNOSIS — Z9989 Dependence on other enabling machines and devices: Secondary | ICD-10-CM

## 2019-05-20 DIAGNOSIS — R0609 Other forms of dyspnea: Secondary | ICD-10-CM

## 2019-05-20 DIAGNOSIS — G4733 Obstructive sleep apnea (adult) (pediatric): Secondary | ICD-10-CM

## 2019-05-20 DIAGNOSIS — E669 Obesity, unspecified: Secondary | ICD-10-CM

## 2019-05-20 DIAGNOSIS — R0602 Shortness of breath: Secondary | ICD-10-CM | POA: Diagnosis not present

## 2019-05-20 NOTE — Progress Notes (Signed)
Diana Hendrix, Diana Hendrix, Diana Hendrix  Chief Complaint  Patient presents with  . Follow-up    F/U for OSA. States she uses her CPAP every night. Uses [employer] as her DME. Denies any new sleep issues.     Constitutional:  BP 118/70   Pulse 67   Temp 97.7 F (36.5 C) (Temporal)   Ht 5\' 3"  (1.6 m)   Wt 204 lb 3.2 oz (92.6 kg)   SpO2 97% Comment: on RA  BMI 36.17 kg/m   Past Medical History:  HTN, HLD, GERD, Macular degeneration, Colon polyps, Osteopenia  Summary:  Diana Hendrix is a 74 y.o. female with obstructive sleep apnea.  Subjective:  She uses CPAP nightly.  Using nasal mask.  Was having mouth dryness until pressure range reduced >> now not an issue.  She was seen by cardiology recently for shortness of breath.  Had Echo that showed diastolic dysfunction Diana mild AR.  Recent lab work showed normal hemoglobin Diana CO2.  She gets winded easily with exertion.  Hasn't been able to exercise at [facility] since pandemic started.  Not having chest pain, palpitations, cough, wheeze, or leg swelling.  No history of smoking.  No history of thromboembolic disease.    Physical Exam:   Appearance - well kempt  ENMT - no sinus tenderness, no nasal discharge, no oral exudate, Mallampati 3  Respiratory - no wheeze, or rales  CV - regular rate Diana rhythm, no murmurs  GI - soft, non tender  Lymph - no adenopathy noted in neck  Ext - no edema  Skin - no rashes  Neuro - normal strength, oriented x 3  Psych - normal mood Diana affect  CMP Latest Ref Rng & Units 04/28/2019 04/24/2019 03/26/2018  Glucose 65 - 99 mg/dL 95 107(H) -  BUN 8 - 27 mg/dL 14 16 19   Creatinine 0.57 - 1.00 mg/dL 1.00 0.98 0.9  Sodium 134 - 144 mmol/L 137 135 134(A)  Potassium 3.5 - 5.2 mmol/L 4.3 4.0 4.0  Chloride 96 - 106 mmol/L 99 99 -  CO2 20 - 29 mmol/L 21 29 -  Calcium 8.7 - 10.3 mg/dL 9.6 9.8 -  Total Protein 6.0 - 8.3 g/dL - 7.6 -  Total Bilirubin 0.2 - 1.2 mg/dL - 0.6 -  Alkaline  Phos 39 - 117 U/L - 88 93  AST 0 - 37 U/L - 18 18  ALT 0 - 35 U/L - 13 11    CBC Latest Ref Rng & Units 04/24/2019 03/26/2018 04/17/2017  WBC 4.0 - 10.5 K/uL 5.8 5.6 8.3  Hemoglobin 12.0 - 15.0 g/dL 13.4 13.5 12.7  Hematocrit 36.0 - 46.0 % 38.7 40 36.7  Platelets 150.0 - 400.0 K/uL 268.0 279 286   ProBNP    Component Value Date/Time   PROBNP 26.0 04/24/2019 0913    Assessment/Plan:   Obstructive sleep apnea. - she is compliant with therapy Diana reports benefit - continue auto CPAP 5 to 9 cm H2O - her device is more than 73 yrs old, not functioning properly Diana not amenable to repair - will arrange for new auto CPAP device - explained she might need repeat home sleep study prior to getting new CPAP machine  Dyspnea on exertion. - will arrange for chest xray Diana Hendrix function testing to help narrow differential - she has f/u with Cardiology later this month  Obesity. - discussed importance of weight loss  A total of  33 minutes spent addressing patient Hendrix issues on  day of visit.   Follow up:  Patient Instructions  Chest xray today Will schedule Hendrix function test Will arrange for new auto CPAP set up Follow up in 6 weeks with Dr. Halford Chessman or Nurse Practitioner   Signature:  Chesley Mires, MD [place] Pager: [phone] 05/20/2019, 10:11 AM  Flow Sheet    Sleep tests:  PSG 09/17/12 >> RDI 13.7, SpO2 low 90%, PLMI 0, occasional PVC's Diana PAC's. Auto CPAP 04/19/19 to 05/18/19 >> used on 30 of 30 nights with average 8 hrs 20 min.  Average AHI 2.2 with median CPAP 9 Diana 95 th percentile CPAP 9 cm H2O.  Cardiac tests:  Echo 04/30/19 >> EF 60 to 65%, grade 2 DD, mild AR  Medications:   Allergies as of 05/20/2019      Reactions   Cortisone Other (See Comments)   seizure    Glucosamine Forte [nutritional Supplements] Nausea Diana Vomiting      Medication List       Accurate as of May 20, 2019 10:11 AM. If you have any questions, ask your nurse  or doctor.        amitriptyline 10 MG tablet Commonly known as: ELAVIL TAKE 2 TABLETS(20 MG) BY MOUTH AT BEDTIME   amLODipine 5 MG tablet Commonly known as: NORVASC Take 5 mg by mouth every evening.   citalopram 10 MG tablet Commonly known as: CELEXA TAKE 1 TABLET(10 MG) BY MOUTH DAILY   COQ10 PO Take 1 tablet by mouth every morning.   linaclotide 290 MCG Caps capsule Commonly known as: Linzess Take 1 capsule (290 mcg total) by mouth daily before breakfast.   metoprolol succinate 50 MG 24 hr tablet Commonly known as: TOPROL-XL Take 75 mg by mouth daily. Take with or immediately following a meal.   olmesartan-hydrochlorothiazide 40-12.5 MG tablet Commonly known as: BENICAR HCT Take 1 tablet by mouth daily.   pantoprazole 40 MG tablet Commonly known as: PROTONIX TAKE 1 TABLET(40 MG) BY MOUTH DAILY   polyethylene glycol powder 17 GM/SCOOP powder Commonly known as: GLYCOLAX/MIRALAX MIX 1 CAPFUL IN 8 OUNCES OF WATER Diana DRINK ONCE A DAY   PROBIOTIC DAILY PO Take 1 tablet by mouth daily.   simvastatin 10 MG tablet Commonly known as: ZOCOR TAKE 1 TABLET(10 MG) BY MOUTH AT BEDTIME   SYSTANE OP Place 1 drop into both eyes 4 (four) times daily.   TURMERIC PO Take 500 mg by mouth daily.   Vitamin D3 50 MCG (2000 UT) Tabs Take 1 tablet by mouth daily.       Past Surgical History:  She  has a past surgical history that includes Cesarean section; Tubal ligation; Shoulder surgery; Meniscus repair (Right, 09/13/2016); Foot neuroma surgery; Diana Colonoscopy (10/29/2013).  Family History:  Her family history includes Atrial fibrillation in her mother; Diabetes in her mother; Hypertension in her mother.  Social History:  She  reports that she has never smoked. She has never used smokeless tobacco. She reports that she does not drink alcohol or use drugs.

## 2019-05-20 NOTE — Patient Instructions (Addendum)
Chest xray today Will schedule pulmonary function test Will arrange for new auto CPAP set up Follow up in 6 weeks with Dr. Halford Chessman or Nurse Practitioner

## 2019-05-21 ENCOUNTER — Ambulatory Visit (HOSPITAL_COMMUNITY): Payer: Medicare Other

## 2019-05-21 ENCOUNTER — Telehealth: Payer: Self-pay | Admitting: Pulmonary Disease

## 2019-05-21 NOTE — Telephone Encounter (Signed)
DG Chest 2 View  Result Date: 05/20/2019 CLINICAL DATA:  Shortness of breath. EXAM: CHEST - 2 VIEW COMPARISON:  April 16, 2017 FINDINGS: The heart, hila, mediastinum, lungs, and pleura are normal. No pulmonary nodules, masses, or focal infiltrates. IMPRESSION: No active cardiopulmonary disease. Electronically Signed   By: Dorise Bullion III M.D   On: 05/20/2019 15:12    Please let her know her chest xray was normal.

## 2019-05-22 NOTE — Telephone Encounter (Signed)
Spoke with patient. She verbalized understanding of results. Nothing further needed at time of call.  

## 2019-05-24 ENCOUNTER — Other Ambulatory Visit: Payer: Self-pay | Admitting: Internal Medicine

## 2019-06-09 ENCOUNTER — Other Ambulatory Visit: Payer: Self-pay | Admitting: Internal Medicine

## 2019-06-09 NOTE — Progress Notes (Signed)
Cardiology Office Note:    Date:  06/10/2019   ID:  [name], DOB Jan 24, 1945, MRN [id number]  PCP:  Hoyt Koch, MD  Cardiologist:  Shirlee More, MD    Referring MD: Hoyt Koch, *    ASSESSMENT:    1. Dyspnea on exertion   2. Essential hypertension    PLAN:    In order of problems listed above:  1. Her shortness of breath appears to be due to hypertensive heart disease with objective evidence of elevated left ventricular filling pressures.  I will stop her calcium channel blocker which can cause confusion with edema with a relatively low blood pressure and avoid hypoperfusion recheck renal function in 2 weeks and placed on a very small dose of a loop diuretic to look for objective response and follow-up in 6 weeks.  She will continue her other antihypertensives ARB and beta-blocker.   Next appointment: 6 weeks   Medication Adjustments/Labs and Tests Ordered: Current medicines are reviewed at length with the patient today.  Concerns regarding medicines are outlined above.  No orders of the defined types were placed in this encounter.  No orders of the defined types were placed in this encounter.   Chief Complaint  Patient presents with  . Follow-up    6 WK FU after myocardial perfusion study and echo cardiogram  . Shortness of Breath    History of Present Illness:    Diana Hendrix is a 74 y.o. female with a hx of chest pain, hypertension, hyperlipidemia and SOB last seen 04/28/2019.She also has a history of GERD and has been seen by gastroenterology.  She has had difficult to control hypertension and has been followed by nephrology. She also has obstructive sleep apnea and uses CPAP.   Compliance with diet, lifestyle and medications: Yes  I reviewed this results below in terms the patient could understand.  She continues to be short of breath with activities like housework and she notices peripheral edema at the end of the day she does take  a calcium channel blocker her blood pressure tends to run 1 [id number] 20 systolic.  Which shortness of breath and findings of pseudonormal diastolic function I think she would benefit from minimum dose of diuretic furosemide 20 mg Monday Wednesday and Friday 2 weeks check renal function I am not can recheck proBNP as it was normal not uncommon in this diastolic heart failure phenotype with obesity and recently in the office 6 weeks she will call me if her systolics are consistently greater than 140.  She is not having orthopnea or chest pain.  She does not add salt to her diet.  Myoview 05/14/2019: Study Highlights  The left ventricular ejection fraction is hyperdynamic (>65%).  Nuclear stress EF: 73%.  There was no ST segment deviation noted during stress.  The study is normal.     Echo 04/30/2019: 1. Left ventricular ejection fraction, by estimation, is 60 to 65%. The  left ventricle has normal function. The left ventricle has no regional  wall motion abnormalities. Left ventricular diastolic parameters are  consistent with Grade II diastolic dysfunction (pseudonormalization).  2. Right ventricular systolic function is normal. The right ventricular  size is normal. There is normal pulmonary artery systolic pressure.  3. The mitral valve is normal in structure. No evidence of mitral valve  regurgitation. No evidence of mitral stenosis.  4. The aortic valve is normal in structure. Aortic valve regurgitation is  mild. No aortic stenosis is present.  Past Medical History:  Diagnosis Date  . GERD (gastroesophageal reflux disease)   . History of colon polyps   . Hyperlipidemia   . Hypertension   . Macular edema, cystoid   . OSA (obstructive sleep apnea) 09/17/2012  . Osteopenia     Past Surgical History:  Procedure Laterality Date  . CESAREAN SECTION    . COLONOSCOPY  10/29/2013   Colonic polyp status post polypectomy. Mild sigmoid diverticulosis. Small internal hemorrhoids  .  FOOT NEUROMA SURGERY     s/p surgery in both feet  . MENISCUS REPAIR Right 09/13/2016  . SHOULDER SURGERY    . TUBAL LIGATION      Current Medications: Current Meds  Medication Sig  . amitriptyline (ELAVIL) 10 MG tablet TAKE 2 TABLETS(20 MG) BY MOUTH AT BEDTIME  . amLODipine (NORVASC) 5 MG tablet Take 5 mg by mouth every evening.   . Cholecalciferol (VITAMIN D3) 50 MCG (2000 UT) TABS Take 1 tablet by mouth daily.  . citalopram (CELEXA) 10 MG tablet TAKE 1 TABLET(10 MG) BY MOUTH DAILY  . Coenzyme Q10 (COQ10 PO) Take 1 tablet by mouth every morning.  . linaclotide (LINZESS) 290 MCG CAPS capsule Take 1 capsule (290 mcg total) by mouth daily before breakfast.  . metoprolol succinate (TOPROL-XL) 50 MG 24 hr tablet Take 75 mg by mouth daily. Take with or immediately following a meal.   . olmesartan-hydrochlorothiazide (BENICAR HCT) 40-12.5 MG per tablet Take 1 tablet by mouth daily.  . pantoprazole (PROTONIX) 40 MG tablet TAKE 1 TABLET(40 MG) BY MOUTH DAILY  . Polyethyl Glycol-Propyl Glycol (SYSTANE OP) Place 1 drop into both eyes 4 (four) times daily.  . polyethylene glycol powder (GLYCOLAX/MIRALAX) powder MIX 1 CAPFUL IN 8 OUNCES OF WATER AND DRINK ONCE A DAY  . Probiotic Product (PROBIOTIC DAILY PO) Take 1 tablet by mouth daily.  . simvastatin (ZOCOR) 10 MG tablet TAKE 1 TABLET(10 MG) BY MOUTH AT BEDTIME  . TURMERIC PO Take 500 mg by mouth daily.     Allergies:   Cortisone   Social History   Socioeconomic History  . Marital status: Married    Spouse name: Not on file  . Number of children: 2  . Years of education: Not on file  . Highest education level: Not on file  Occupational History  . Not on file  Tobacco Use  . Smoking status: Never Smoker  . Smokeless tobacco: Never Used  Substance and Sexual Activity  . Alcohol use: No  . Drug use: No  . Sexual activity: Not Currently  Other Topics Concern  . Not on file  Social History Narrative  . Not on file   Social  Determinants of Health   Financial Resource Strain:   . Difficulty of Paying Living Expenses:   Food Insecurity:   . Worried About Charity fundraiser in the Last Year:   . Arboriculturist in the Last Year:   Transportation Needs:   . Film/video editor (Medical):   Marland Kitchen Lack of Transportation (Non-Medical):   Physical Activity:   . Days of Exercise per Week:   . Minutes of Exercise per Session:   Stress:   . Feeling of Stress :   Social Connections:   . Frequency of Communication with Friends and Family:   . Frequency of Social Gatherings with Friends and Family:   . Attends Religious Services:   . Active Member of Clubs or Organizations:   . Attends Archivist Meetings:   .  Marital Status:      Family History: The patient's family history includes Atrial fibrillation in her mother; Diabetes in her mother; Hypertension in her mother. There is no history of Colon cancer or Esophageal cancer. ROS:   Please see the history of present illness.    All other systems reviewed and are negative.  EKGs/Labs/Other Studies Reviewed:    The following studies were reviewed today: Recent Labs: 04/24/2019: ALT 13; Hemoglobin 13.4; Platelets 268.0; Pro B Natriuretic peptide (BNP) 26.0 04/28/2019: BUN 14; Creatinine, Ser 1.00; Potassium 4.3; Sodium 137  Recent Lipid Panel    Component Value Date/Time   CHOL 182 04/24/2019 0913   TRIG 103.0 04/24/2019 0913   HDL 50.50 04/24/2019 0913   CHOLHDL 4 04/24/2019 0913   VLDL 20.6 04/24/2019 0913   LDLCALC 111 (H) 04/24/2019 0913    Physical Exam:    VS:  BP 110/62   Pulse 67   Ht 5\' 3"  (1.6 m)   Wt 205 lb 3.2 oz (93.1 kg)   SpO2 96%   BMI 36.35 kg/m     Wt Readings from Last 3 Encounters:  06/10/19 205 lb 3.2 oz (93.1 kg)  05/20/19 204 lb 3.2 oz (92.6 kg)  05/14/19 203 lb (92.1 kg)     GEN:  Well nourished, well developed in no acute distress HEENT: Normal NECK: No JVD; No carotid bruits LYMPHATICS: No  lymphadenopathy CARDIAC: RRR, no murmurs, rubs, gallops RESPIRATORY:  Clear to auscultation without rales, wheezing or rhonchi  ABDOMEN: Soft, non-tender, non-distended MUSCULOSKELETAL: Trace to 1+ bilateral lower extremity edema; No deformity  SKIN: Warm and dry NEUROLOGIC:  Alert and oriented x 3 PSYCHIATRIC:  Normal affect    Signed, Shirlee More, MD  06/10/2019 8:29 AM    [facility]

## 2019-06-10 ENCOUNTER — Other Ambulatory Visit: Payer: Self-pay

## 2019-06-10 ENCOUNTER — Ambulatory Visit (INDEPENDENT_AMBULATORY_CARE_PROVIDER_SITE_OTHER): Payer: Medicare Other | Admitting: Cardiology

## 2019-06-10 ENCOUNTER — Encounter: Payer: Self-pay | Admitting: Cardiology

## 2019-06-10 VITALS — BP 110/62 | HR 67 | Ht 63.0 in | Wt 205.2 lb

## 2019-06-10 DIAGNOSIS — R0600 Dyspnea, unspecified: Secondary | ICD-10-CM | POA: Diagnosis not present

## 2019-06-10 DIAGNOSIS — I10 Essential (primary) hypertension: Secondary | ICD-10-CM

## 2019-06-10 DIAGNOSIS — R0609 Other forms of dyspnea: Secondary | ICD-10-CM

## 2019-06-10 MED ORDER — FUROSEMIDE 20 MG PO TABS
20.0000 mg | ORAL_TABLET | ORAL | 3 refills | Status: DC
Start: 2019-06-11 — End: 2020-03-04

## 2019-06-10 NOTE — Addendum Note (Signed)
Addended by: Resa Miner I on: 06/10/2019 08:45 AM   Modules accepted: Orders

## 2019-06-10 NOTE — Patient Instructions (Signed)
Medication Instructions:  Your physician has recommended you make the following change in your medication:  STOP: Amlodipine START: Furosemide 20 mg take one tablet by mouth on Monday, Wednesday, and Friday *If you need a refill on your cardiac medications before your next appointment, please call your pharmacy*   Lab Work: Your physician recommends that you return for lab work in: TWO weeks BMP If you have labs (blood work) drawn today and your tests are completely normal, you will receive your results only by: Marland Kitchen MyChart Message (if you have MyChart) OR . A paper copy in the mail If you have any lab test that is abnormal or we need to change your treatment, we will call you to review the results.   Testing/Procedures: None   Follow-Up: At [facility], you and your health needs are our priority.  As part of our continuing mission to provide you with exceptional heart care, we have created designated Provider Care Teams.  These Care Teams include your primary Cardiologist (physician) and Advanced Practice Providers (APPs -  Physician Assistants and Nurse Practitioners) who all work together to provide you with the care you need, when you need it.  We recommend signing up for the patient portal called "MyChart".  Sign up information is provided on this After Visit Summary.  MyChart is used to connect with patients for Virtual Visits (Telemedicine).  Patients are able to view lab/test results, encounter notes, upcoming appointments, etc.  Non-urgent messages can be sent to your provider as well.   To learn more about what you can do with MyChart, go to [email or web address].    Your next appointment:   6 week(s)  The format for your next appointment:   In Person  Provider:   Dr. Bettina Gavia   Other Instructions Please take your BP daily at home and call us if your systolic BP is above [phone].

## 2019-06-21 ENCOUNTER — Other Ambulatory Visit: Payer: Self-pay | Admitting: Internal Medicine

## 2019-06-24 DIAGNOSIS — I10 Essential (primary) hypertension: Secondary | ICD-10-CM | POA: Diagnosis not present

## 2019-06-24 DIAGNOSIS — R0600 Dyspnea, unspecified: Secondary | ICD-10-CM | POA: Diagnosis not present

## 2019-06-24 LAB — BASIC METABOLIC PANEL
BUN/Creatinine Ratio: 18 (ref 12–28)
BUN: 18 mg/dL (ref 8–27)
CO2: 24 mmol/L (ref 20–29)
Calcium: 9.7 mg/dL (ref 8.7–10.3)
Chloride: 93 mmol/L — ABNORMAL LOW (ref 96–106)
Creatinine, Ser: 1.01 mg/dL — ABNORMAL HIGH (ref 0.57–1.00)
GFR calc Af Amer: 64 mL/min/{1.73_m2} (ref >59)
GFR calc non Af Amer: 56 mL/min/{1.73_m2} — ABNORMAL LOW (ref >59)
Glucose: 105 mg/dL — ABNORMAL HIGH (ref 65–99)
Potassium: 4.2 mmol/L (ref 3.5–5.2)
Sodium: 130 mmol/L — ABNORMAL LOW (ref 134–144)

## 2019-06-28 ENCOUNTER — Other Ambulatory Visit (HOSPITAL_COMMUNITY): Payer: Medicare Other

## 2019-07-12 ENCOUNTER — Other Ambulatory Visit: Payer: Self-pay | Admitting: Internal Medicine

## 2019-07-18 ENCOUNTER — Other Ambulatory Visit: Payer: Self-pay

## 2019-07-18 ENCOUNTER — Ambulatory Visit (INDEPENDENT_AMBULATORY_CARE_PROVIDER_SITE_OTHER): Payer: Medicare Other | Admitting: Cardiology

## 2019-07-18 ENCOUNTER — Ambulatory Visit (INDEPENDENT_AMBULATORY_CARE_PROVIDER_SITE_OTHER): Payer: Medicare Other

## 2019-07-18 ENCOUNTER — Encounter: Payer: Self-pay | Admitting: Cardiology

## 2019-07-18 VITALS — BP 118/64 | HR 68 | Ht 63.0 in | Wt 203.8 lb

## 2019-07-18 DIAGNOSIS — I10 Essential (primary) hypertension: Secondary | ICD-10-CM | POA: Diagnosis not present

## 2019-07-18 DIAGNOSIS — R002 Palpitations: Secondary | ICD-10-CM

## 2019-07-18 DIAGNOSIS — I951 Orthostatic hypotension: Secondary | ICD-10-CM

## 2019-07-18 DIAGNOSIS — R0602 Shortness of breath: Secondary | ICD-10-CM

## 2019-07-18 DIAGNOSIS — M7989 Other specified soft tissue disorders: Secondary | ICD-10-CM

## 2019-07-18 NOTE — Patient Instructions (Signed)
Medication Instructions:  Your physician recommends that you continue on your current medications as directed. Please refer to the Current Medication list given to you today.  Use over the counter mucinex. *If you need a refill on your cardiac medications before your next appointment, please call your pharmacy*   Lab Work: Your physician recommends that you return for lab work in: TODAY BMP, ProBNP If you have labs (blood work) drawn today and your tests are completely normal, you will receive your results only by: Marland Kitchen MyChart Message (if you have MyChart) OR . A paper copy in the mail If you have any lab test that is abnormal or we need to change your treatment, we will call you to review the results.   Testing/Procedures: A zio monitor was ordered today. It will remain on for 3 days. You will then return monitor and event diary in provided box. It takes 1-2 weeks for report to be downloaded and returned to [place]. We will call you with the results. If monitor falls off or has orange flashing light, please call Zio for further instructions.      Follow-Up: At [facility], you and your health needs are our priority.  As part of our continuing mission to provide you with exceptional heart care, we have created designated Provider Care Teams.  These Care Teams include your primary Cardiologist (physician) and Advanced Practice Providers (APPs -  Physician Assistants and Nurse Practitioners) who all work together to provide you with the care you need, when you need it.  We recommend signing up for the patient portal called "MyChart".  Sign up information is provided on this After Visit Summary.  MyChart is used to connect with patients for Virtual Visits (Telemedicine).  Patients are able to view lab/test results, encounter notes, upcoming appointments, etc.  Non-urgent messages can be sent to your provider as well.   To learn more about what you can do with MyChart, go to  [email or web address].    Your next appointment:   6 month(s)  The format for your next appointment:   In Person  Provider:   Shirlee More, MD   Other Instructions

## 2019-07-18 NOTE — Progress Notes (Signed)
Cardiology Office Note:    Date:  07/18/2019   ID:  Diana Hendrix, DOB 22-Sep-1945, MRN [id number]  PCP:  Hoyt Koch, MD  Cardiologist:  Shirlee More, MD    Referring MD: Hoyt Koch, *    ASSESSMENT:    1. Orthostatic hypotension   2. Essential hypertension   3. Palpitations   4. Swelling of left lower extremity    PLAN:    In order of problems listed above:  1. Off calcium channel blocker off Elavil taking a small dose of loop diuretic she is improved edema is cleared BP at target and not having orthostatic hypotension.  She has complaints of rapid heart rhythm we will plan 3-day ZIO monitor and with her diuretics check renal function potassium.  She discussed many other concerns including secretions she sees pulmonary showed PFTs done I advised her to try using Mucinex and intermittent very minor nonanginal chest pain at this time I do not think she requires further ischemia evaluation.  To continue her current antihypertensive medications as well as statin for lipid lowering.   Next appointment: 6 months   Medication Adjustments/Labs and Tests Ordered: Current medicines are reviewed at length with the patient today.  Concerns regarding medicines are outlined above.  No orders of the defined types were placed in this encounter.  No orders of the defined types were placed in this encounter.   Chief Complaint  Patient presents with  . Follow-up  . Congestive Heart Failure  . Hypertension    History of Present Illness:    Diana Hendrix is a 74 y.o. female with a hx of hypertensive heart disease which shortness of breath hyperlipidemia and GERD as well as obstructive sleep apnea on CPAP.  She was last seen 06/10/2019.  Calcium channel blocker was discontinued and she was placed on a loop diuretic.  Went over the myocardial perfusion study showing no ischemia EF 73% and an echocardiogram showing mild LVH and diastolic dysfunction.  proBNP level was  not elevated. Compliance with diet, lifestyle and medications: Yes  Overall is doing well blood pressure target edema is resolved and no further episodes of symptomatic orthostatic hypotension.  She is concerned because of secretions and has been talking to pulmonary about getting PFTs done I advised her to use Mucinex.  Time she gets brief nonanginal chest pain and she has had a myocardial perfusion study that was normal.  Think we need to repeat an ischemic evaluation the end of the procedure she tells me at times she thinks her heart rate is excessively rapid by using her apple watch not having palpitation will apply a 3-day ZIO monitor to screen for arrhythmia.  No longer takes a tricyclic antidepressant Past Medical History:  Diagnosis Date  . GERD (gastroesophageal reflux disease)   . History of colon polyps   . Hyperlipidemia   . Hypertension   . Macular edema, cystoid   . OSA (obstructive sleep apnea) 09/17/2012  . Osteopenia     Past Surgical History:  Procedure Laterality Date  . CESAREAN SECTION    . COLONOSCOPY  10/29/2013   Colonic polyp status post polypectomy. Mild sigmoid diverticulosis. Small internal hemorrhoids  . FOOT NEUROMA SURGERY     s/p surgery in both feet  . MENISCUS REPAIR Right 09/13/2016  . SHOULDER SURGERY    . TUBAL LIGATION      Current Medications: Current Meds  Medication Sig  . amitriptyline (ELAVIL) 10 MG tablet TAKE 2 TABLETS(20 MG)  BY MOUTH AT BEDTIME  . Cholecalciferol (VITAMIN D3) 50 MCG (2000 UT) TABS Take 1 tablet by mouth daily.  . citalopram (CELEXA) 10 MG tablet TAKE 1 TABLET(10 MG) BY MOUTH DAILY  . Coenzyme Q10 (COQ10 PO) Take 1 tablet by mouth every morning.  . furosemide (LASIX) 20 MG tablet Take 1 tablet (20 mg total) by mouth 3 (three) times a week. Please take on Monday, Wednesday, and Friday  . linaclotide (LINZESS) 290 MCG CAPS capsule Take 1 capsule (290 mcg total) by mouth daily before breakfast.  . metoprolol succinate  (TOPROL-XL) 50 MG 24 hr tablet Take 75 mg by mouth daily. Take with or immediately following a meal.   . olmesartan-hydrochlorothiazide (BENICAR HCT) 40-12.5 MG per tablet Take 1 tablet by mouth daily.  . pantoprazole (PROTONIX) 40 MG tablet TAKE 1 TABLET(40 MG) BY MOUTH DAILY  . Polyethyl Glycol-Propyl Glycol (SYSTANE OP) Place 1 drop into both eyes 4 (four) times daily.  . polyethylene glycol powder (GLYCOLAX/MIRALAX) powder MIX 1 CAPFUL IN 8 OUNCES OF WATER AND DRINK ONCE A DAY  . Probiotic Product (PROBIOTIC DAILY PO) Take 1 tablet by mouth daily.  . simvastatin (ZOCOR) 10 MG tablet TAKE 1 TABLET(10 MG) BY MOUTH AT BEDTIME  . TURMERIC PO Take 500 mg by mouth daily.     Allergies:   Cortisone   Social History   Socioeconomic History  . Marital status: Married    Spouse name: Not on file  . Number of children: 2  . Years of education: Not on file  . Highest education level: Not on file  Occupational History  . Not on file  Tobacco Use  . Smoking status: Never Smoker  . Smokeless tobacco: Never Used  Vaping Use  . Vaping Use: Never used  Substance and Sexual Activity  . Alcohol use: No  . Drug use: No  . Sexual activity: Not Currently  Other Topics Concern  . Not on file  Social History Narrative  . Not on file   Social Determinants of Health   Financial Resource Strain:   . Difficulty of Paying Living Expenses:   Food Insecurity:   . Worried About Charity fundraiser in the Last Year:   . Arboriculturist in the Last Year:   Transportation Needs:   . Film/video editor (Medical):   Marland Kitchen Lack of Transportation (Non-Medical):   Physical Activity:   . Days of Exercise per Week:   . Minutes of Exercise per Session:   Stress:   . Feeling of Stress :   Social Connections:   . Frequency of Communication with Friends and Family:   . Frequency of Social Gatherings with Friends and Family:   . Attends Religious Services:   . Active Member of Clubs or Organizations:   .  Attends Archivist Meetings:   Marland Kitchen Marital Status:      Family History: The patient's family history includes Atrial fibrillation in her mother; Diabetes in her mother; Hypertension in her mother. There is no history of Colon cancer or Esophageal cancer. ROS:   Please see the history of present illness.    All other systems reviewed and are negative.  EKGs/Labs/Other Studies Reviewed:    The following studies were reviewed today:    Recent Labs: 04/24/2019: ALT 13; Hemoglobin 13.4; Platelets 268.0; Pro B Natriuretic peptide (BNP) 26.0 06/24/2019: BUN 18; Creatinine, Ser 1.01; Potassium 4.2; Sodium 130  Recent Lipid Panel    Component Value Date/Time  CHOL 182 04/24/2019 0913   TRIG 103.0 04/24/2019 0913   HDL 50.50 04/24/2019 0913   CHOLHDL 4 04/24/2019 0913   VLDL 20.6 04/24/2019 0913   LDLCALC 111 (H) 04/24/2019 0913    Physical Exam:    VS:  BP 118/64 (BP Location: Right Arm, Patient Position: Sitting, Cuff Size: Normal)   Pulse 68   Ht 5\' 3"  (1.6 m)   Wt 203 lb 12.8 oz (92.4 kg)   SpO2 96%   BMI 36.10 kg/m     Wt Readings from Last 3 Encounters:  07/18/19 203 lb 12.8 oz (92.4 kg)  06/10/19 205 lb 3.2 oz (93.1 kg)  05/20/19 204 lb 3.2 oz (92.6 kg)     GEN:  Well nourished, well developed in no acute distress HEENT: Normal NECK: No JVD; No carotid bruits LYMPHATICS: No lymphadenopathy CARDIAC: RRR, no murmurs, rubs, gallops RESPIRATORY:  Clear to auscultation without rales, wheezing or rhonchi  ABDOMEN: Soft, non-tender, non-distended MUSCULOSKELETAL:  No edema; No deformity  SKIN: Warm and dry NEUROLOGIC:  Alert and oriented x 3 PSYCHIATRIC:  Normal affect    Signed, Shirlee More, MD  07/18/2019 1:14 PM    [facility]

## 2019-07-19 LAB — BASIC METABOLIC PANEL
BUN/Creatinine Ratio: 15 (ref 12–28)
BUN: 15 mg/dL (ref 8–27)
CO2: 24 mmol/L (ref 20–29)
Calcium: 9.5 mg/dL (ref 8.7–10.3)
Chloride: 91 mmol/L — ABNORMAL LOW (ref 96–106)
Creatinine, Ser: 0.98 mg/dL (ref 0.57–1.00)
GFR calc Af Amer: 67 mL/min/{1.73_m2} (ref >59)
GFR calc non Af Amer: 58 mL/min/{1.73_m2} — ABNORMAL LOW (ref >59)
Glucose: 87 mg/dL (ref 65–99)
Potassium: 4.6 mmol/L (ref 3.5–5.2)
Sodium: 130 mmol/L — ABNORMAL LOW (ref 134–144)

## 2019-07-19 LAB — PRO B NATRIURETIC PEPTIDE: NT-Pro BNP: 52 pg/mL (ref 0–301)

## 2019-07-22 ENCOUNTER — Telehealth: Payer: Self-pay

## 2019-07-22 NOTE — Telephone Encounter (Signed)
Spoke with patient regarding results and recommendation.  Patient verbalizes understanding and is agreeable to plan of care. Advised patient to call back with any issues or concerns.  

## 2019-07-22 NOTE — Telephone Encounter (Signed)
-----   Message from Berniece Salines, DO sent at 07/22/2019 10:22 AM EDT ----- Labs stable compared to 4 weeks ago.

## 2019-08-02 DIAGNOSIS — R002 Palpitations: Secondary | ICD-10-CM | POA: Diagnosis not present

## 2019-08-06 ENCOUNTER — Telehealth: Payer: Self-pay

## 2019-08-06 NOTE — Telephone Encounter (Signed)
-----   Message from Richardo Priest, MD sent at 08/06/2019 10:38 AM EDT ----- Normal or stable result  No arrhythmia is seen

## 2019-08-06 NOTE — Telephone Encounter (Signed)
Spoke with patient regarding results and recommendation.  Patient verbalizes understanding and is agreeable to plan of care. Advised patient to call back with any issues or concerns.  

## 2019-08-07 ENCOUNTER — Other Ambulatory Visit: Payer: Self-pay

## 2019-08-07 ENCOUNTER — Ambulatory Visit (INDEPENDENT_AMBULATORY_CARE_PROVIDER_SITE_OTHER): Payer: Medicare Other | Admitting: Pulmonary Disease

## 2019-08-07 DIAGNOSIS — R0609 Other forms of dyspnea: Secondary | ICD-10-CM

## 2019-08-07 DIAGNOSIS — R0600 Dyspnea, unspecified: Secondary | ICD-10-CM | POA: Diagnosis not present

## 2019-08-07 LAB — PULMONARY FUNCTION TEST
DL/VA % pred: 93 %
DL/VA: 3.87 ml/min/mmHg/L
DLCO cor % pred: 88 %
DLCO cor: 16.1 ml/min/mmHg
DLCO unc % pred: 88 %
DLCO unc: 16.1 ml/min/mmHg
FEF 25-75 Post: 3.02 L/sec
FEF 25-75 Pre: 2.71 L/sec
FEF2575-%Change-Post: 11 %
FEF2575-%Pred-Post: 180 %
FEF2575-%Pred-Pre: 162 %
FEV1-%Change-Post: 3 %
FEV1-%Pred-Post: 108 %
FEV1-%Pred-Pre: 104 %
FEV1-Post: 2.2 L
FEV1-Pre: 2.13 L
FEV1FVC-%Change-Post: 0 %
FEV1FVC-%Pred-Pre: 111 %
FEV6-%Change-Post: -2 %
FEV6-%Pred-Post: 96 %
FEV6-%Pred-Pre: 98 %
FEV6-Post: 2.48 L
FEV6-Pre: 2.53 L
FEV6FVC-%Change-Post: -4 %
FEV6FVC-%Pred-Post: 100 %
FEV6FVC-%Pred-Pre: 105 %
FVC-%Change-Post: 2 %
FVC-%Pred-Post: 96 %
FVC-%Pred-Pre: 93 %
FVC-Post: 2.6 L
FVC-Pre: 2.54 L
Post FEV1/FVC ratio: 85 %
Post FEV6/FVC ratio: 95 %
Pre FEV1/FVC ratio: 84 %
Pre FEV6/FVC Ratio: 100 %
RV % pred: 78 %
RV: 1.7 L
TLC % pred: 89 %
TLC: 4.34 L

## 2019-08-07 NOTE — Progress Notes (Signed)
PFT done tpday.

## 2019-08-11 ENCOUNTER — Other Ambulatory Visit: Payer: Self-pay

## 2019-08-11 ENCOUNTER — Encounter: Payer: Self-pay | Admitting: Primary Care

## 2019-08-11 ENCOUNTER — Ambulatory Visit (INDEPENDENT_AMBULATORY_CARE_PROVIDER_SITE_OTHER): Payer: Medicare Other | Admitting: Primary Care

## 2019-08-11 DIAGNOSIS — R0600 Dyspnea, unspecified: Secondary | ICD-10-CM

## 2019-08-11 DIAGNOSIS — G4733 Obstructive sleep apnea (adult) (pediatric): Secondary | ICD-10-CM | POA: Diagnosis not present

## 2019-08-11 DIAGNOSIS — R0609 Other forms of dyspnea: Secondary | ICD-10-CM | POA: Insufficient documentation

## 2019-08-11 NOTE — Progress Notes (Signed)
Reviewed and agree with assessment/plan.   Chesley Mires, MD [facility] Pulmonary/Critical Care 08/11/2019, 2:37 PM Pager:  [phone]

## 2019-08-11 NOTE — Assessment & Plan Note (Signed)
-  Dyspnea on exertion improved on furosemide 3 times a week -Chest x-ray and pulmonary function testing are normal -Continue to follow with cardiology as instructed -No additional testing needed at this time

## 2019-08-11 NOTE — Progress Notes (Signed)
@Patient  ID: Diana Hendrix, female    DOB: May 15, 1945, 74 y.o.   MRN: [id number]  Chief Complaint  Patient presents with   Follow-up    Referring provider: Hoyt Koch, *  HPI: 74 year old female, never smoked. Past medical history significant for obstructive sleep apnea on CPAP, dyspnea on exertion. Patient of Dr. Halford Chessman, last seen in office in May 2021. Chest x-ray at that time showed no active cardiopulmonary disease. She was ordered for pulmonary function testing.  08/11/2019 Patient presents today for 56-month follow-up with PFTs. She is doing well states that her dyspnea as improved since seeing cardiology. Her blood pressure medication was changed and she was put on Furosemide three times a week. Pulmonary function testing done on July 22nd appears normal. She reports left ear fullness since doing testing, improved some after taking Claritin.    Pulmonary function testing 08/07/2019 PFT-FVC 2.60 (96%), FEV1 2.20 (108%), ratio 85, DLCO 88% Interpretation: normal spirometry and lung volumes, normal diffusion capacity, no bronchodilator response  Airview download 07/12/2019-08/10/2019 30/30 days used; 100% greater than 4 hours Average usage 8 hours 22 minutes Pressure 5-9 cm H2O AHI 1.1   Allergies  Allergen Reactions   Cortisone Other (See Comments)    seizure     Immunization History  Administered Date(s) Administered   Fluad Quad(high Dose 65+) 10/16/2018   Influenza, High Dose Seasonal PF 10/09/2016, 10/04/2017   Influenza,inj,Quad PF,6+ Mos 10/25/2012, 09/17/2013, 09/21/2015   Influenza-Unspecified 12/12/2014   Moderna SARS-COVID-2 Vaccination 02/28/2019, 03/28/2019   Pneumococcal Conjugate-13 03/18/2015   Pneumococcal Polysaccharide-23 09/17/2013   Tdap 03/18/2015   Zoster 09/17/2011    Past Medical History:  Diagnosis Date   GERD (gastroesophageal reflux disease)    History of colon polyps    Hyperlipidemia    Hypertension     Macular edema, cystoid    OSA (obstructive sleep apnea) 09/17/2012   Osteopenia     Tobacco History: Social History   Tobacco Use  Smoking Status Never Smoker  Smokeless Tobacco Never Used   Counseling given: Not Answered   Outpatient Medications Prior to Visit  Medication Sig Dispense Refill   amitriptyline (ELAVIL) 10 MG tablet TAKE 2 TABLETS(20 MG) BY MOUTH AT BEDTIME 180 tablet 1   Cholecalciferol (VITAMIN D3) 50 MCG (2000 UT) TABS Take 1 tablet by mouth daily.     citalopram (CELEXA) 10 MG tablet TAKE 1 TABLET(10 MG) BY MOUTH DAILY 90 tablet 1   Coenzyme Q10 (COQ10 PO) Take 1 tablet by mouth every morning.     furosemide (LASIX) 20 MG tablet Take 1 tablet (20 mg total) by mouth 3 (three) times a week. Please take on Monday, Wednesday, and Friday 45 tablet 3   linaclotide (LINZESS) 290 MCG CAPS capsule Take 1 capsule (290 mcg total) by mouth daily before breakfast. 30 capsule 6   metoprolol succinate (TOPROL-XL) 50 MG 24 hr tablet Take 75 mg by mouth daily. Take with or immediately following a meal.      olmesartan-hydrochlorothiazide (BENICAR HCT) 40-12.5 MG per tablet Take 1 tablet by mouth daily.     pantoprazole (PROTONIX) 40 MG tablet TAKE 1 TABLET(40 MG) BY MOUTH DAILY 90 tablet 1   Polyethyl Glycol-Propyl Glycol (SYSTANE OP) Place 1 drop into both eyes 4 (four) times daily.     polyethylene glycol powder (GLYCOLAX/MIRALAX) powder MIX 1 CAPFUL IN 8 OUNCES OF WATER AND DRINK ONCE A DAY 527 g 3   Probiotic Product (PROBIOTIC DAILY PO) Take 1 tablet by mouth daily.  simvastatin (ZOCOR) 10 MG tablet TAKE 1 TABLET(10 MG) BY MOUTH AT BEDTIME 90 tablet 1   TURMERIC PO Take 500 mg by mouth daily.     No facility-administered medications prior to visit.    Review of Systems  Review of Systems  Constitutional: Negative.   Respiratory: Negative for cough, chest tightness, shortness of breath and wheezing.   Cardiovascular: Negative.     Physical Exam  BP  112/68 (BP Location: Left Arm, Cuff Size: Normal)    Pulse 65    Temp 98.1 F (36.7 C) (Oral)    Ht 5' 2.5" (1.588 m)    Wt (!) 205 lb 12.8 oz (93.4 kg)    SpO2 98%    BMI 37.04 kg/m  Physical Exam Constitutional:      Appearance: Normal appearance.  HENT:     Right Ear: Tympanic membrane, ear canal and external ear normal. There is no impacted cerumen.     Left Ear: Tympanic membrane, ear canal and external ear normal. There is no impacted cerumen.     Mouth/Throat:     Mouth: Mucous membranes are moist.     Pharynx: Oropharynx is clear.  Cardiovascular:     Rate and Rhythm: Normal rate and regular rhythm.  Pulmonary:     Effort: Pulmonary effort is normal.     Breath sounds: Normal breath sounds.  Skin:    General: Skin is warm and dry.  Neurological:     General: No focal deficit present.     Mental Status: She is alert and oriented to person, place, and time. Mental status is at baseline.  Psychiatric:        Mood and Affect: Mood normal.        Behavior: Behavior normal.        Thought Content: Thought content normal.        Judgment: Judgment normal.      Lab Results:  CBC    Component Value Date/Time   WBC 5.8 04/24/2019 0913   RBC 4.36 04/24/2019 0913   HGB 13.4 04/24/2019 0913   HCT 38.7 04/24/2019 0913   PLT 268.0 04/24/2019 0913   MCV 88.8 04/24/2019 0913   MCH 30.5 04/17/2017 0347   MCHC 34.6 04/24/2019 0913   RDW 13.3 04/24/2019 0913    BMET    Component Value Date/Time   NA 130 (L) 07/18/2019 1326   K 4.6 07/18/2019 1326   CL 91 (L) 07/18/2019 1326   CO2 24 07/18/2019 1326   GLUCOSE 87 07/18/2019 1326   GLUCOSE 107 (H) 04/24/2019 0913   BUN 15 07/18/2019 1326   CREATININE 0.98 07/18/2019 1326   CALCIUM 9.5 07/18/2019 1326   GFRNONAA 58 (L) 07/18/2019 1326   GFRAA 67 07/18/2019 1326    BNP No results found for: BNP  ProBNP    Component Value Date/Time   PROBNP 52 07/18/2019 1326   PROBNP 26.0 04/24/2019 0913    Imaging: LONG TERM  MONITOR (3-14 DAYS)  Result Date: 08/06/2019 A ZIO monitor was performed for 2 days and 21 hours beginning 07/18/2019 to assess palpitation. The cardiac rhythm throughout was sinus with average minimum and maximum heart rates of 63, 49 and 117 bpm.  The minimum rate was sinus bradycardia.  First-degree AV block is noted. There were no pauses of 3 seconds or greater and no episodes of second or third-degree AV node block or sinus node exit block. Ventricular ectopy was rare with isolated PVCs. Supraventricular ectopy was rare and  there were no episodes of atrial fibrillation, flutter or SVT. There were no triggered or diary events. Conclusion normal 3-day ZIO monitor without clinically significant arrhythmia.    Assessment & Plan:   OSA (obstructive sleep apnea) -Patient remains on percent compliant with CPAP and reports benefit from use -Current pressure 5-9 cm H2O; AHI 1.1 -No changes today -Follow-up in May 2022 with Dr. Halford Chessman  Dyspnea on exertion -Dyspnea on exertion improved on furosemide 3 times a week -Chest x-ray and pulmonary function testing are normal -Continue to follow with cardiology as instructed -No additional testing needed at this time     Martyn Ehrich, NP 08/11/2019

## 2019-08-11 NOTE — Patient Instructions (Signed)
Pleasure meeting you today Diana Hendrix Chest x-ray and pulmonary function testing appears normal  Recommendations Continue CPAP therapy every night for 4 to 6 hours or more Continue Mucinex twice daily as needed for congestion Use Claritin and Flonase nasal spray as needed for allergy symptoms/ear congestion  Follow-up Annual visit due in May 2022 with Dr. Halford Chessman

## 2019-08-11 NOTE — Assessment & Plan Note (Signed)
-  Patient remains on percent compliant with CPAP and reports benefit from use -Current pressure 5-9 cm H2O; AHI 1.1 -No changes today -Follow-up in May 2022 with Dr. Halford Chessman

## 2019-08-14 DIAGNOSIS — N182 Chronic kidney disease, stage 2 (mild): Secondary | ICD-10-CM | POA: Diagnosis not present

## 2019-08-14 DIAGNOSIS — R809 Proteinuria, unspecified: Secondary | ICD-10-CM | POA: Diagnosis not present

## 2019-08-14 DIAGNOSIS — I110 Hypertensive heart disease with heart failure: Secondary | ICD-10-CM | POA: Insufficient documentation

## 2019-08-14 DIAGNOSIS — N189 Chronic kidney disease, unspecified: Secondary | ICD-10-CM | POA: Diagnosis not present

## 2019-08-14 DIAGNOSIS — I10 Essential (primary) hypertension: Secondary | ICD-10-CM | POA: Diagnosis not present

## 2019-09-09 DIAGNOSIS — M6701 Short Achilles tendon (acquired), right ankle: Secondary | ICD-10-CM | POA: Diagnosis not present

## 2019-09-09 DIAGNOSIS — M722 Plantar fascial fibromatosis: Secondary | ICD-10-CM | POA: Insufficient documentation

## 2019-09-09 DIAGNOSIS — M7731 Calcaneal spur, right foot: Secondary | ICD-10-CM | POA: Diagnosis not present

## 2019-09-09 HISTORY — DX: Plantar fascial fibromatosis: M72.2

## 2019-09-11 ENCOUNTER — Telehealth: Payer: Self-pay | Admitting: Cardiology

## 2019-09-11 NOTE — Telephone Encounter (Signed)
I have seen her for orthostatic hypotension.  I would follow the instructions of her nephrologist for chronic kidney disease and hypertension

## 2019-09-11 NOTE — Telephone Encounter (Signed)
Patient said she needs to speak with doctor or nurse regarding a medication that was changed by another doctor

## 2019-09-11 NOTE — Telephone Encounter (Signed)
Pt calling to discuss medication management. She states her nephrologist stopped her HCTZ/Benicar and has her taking only Benicar due to Na 129. She states she has began to have DOE once again and has started back on her HCTZ/Benicar for relief. She is wondering if this is okay or is there another medication she can take to help with fluid management and not cause low Na levels.  She also wonders if she really needs to continue seeing her nephrologist for HTN management and only see Dr. Bettina Gavia.   I advised her I would forward her request to Dr. Bettina Gavia and his RN for review and recommendation.

## 2019-09-11 NOTE — Telephone Encounter (Signed)
Ms. Heaton is aware of Dr. Joya Gaskins recommendation. She will contact her nephrologist to discuss her symptoms.

## 2019-09-30 DIAGNOSIS — M722 Plantar fascial fibromatosis: Secondary | ICD-10-CM | POA: Diagnosis not present

## 2019-09-30 DIAGNOSIS — M6701 Short Achilles tendon (acquired), right ankle: Secondary | ICD-10-CM | POA: Diagnosis not present

## 2019-10-24 ENCOUNTER — Ambulatory Visit: Payer: Medicare Other | Admitting: Internal Medicine

## 2019-10-26 ENCOUNTER — Other Ambulatory Visit: Payer: Self-pay | Admitting: Gastroenterology

## 2019-11-09 NOTE — Progress Notes (Signed)
Subjective:    Patient ID: Diana Hendrix, female    DOB: 1945/04/29, 74 y.o.   MRN: [id number]  HPI The patient is here for an acute visit.   Sore muscle in right arm -  It started month or more ago.  First thing in the morning it is very sore.  It gets better during the day.  At times her shoulder and wrist ache.  It will wake her her up at night sometimes.  She has taken tylenol and on occasion advil, but can't take it due to CKD.    No N/T.  Her hand is weak, especially in the morning.   Medications and allergies reviewed with patient and updated if appropriate.  Patient Active Problem List   Diagnosis Date Noted  . Plantar fasciitis 09/09/2019  . Heel cord tightness, right 09/09/2019  . Benign essential hypertension 08/14/2019  . Dyspnea on exertion 08/11/2019  . Chest tightness 04/25/2019  . 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 Constipation 10/16/2018  . Vasovagal syncope 04/16/2017  . Impaired fasting blood sugar 10/11/2016  . S/P right knee arthroscopy 09/21/2016  . Other specified postprocedural states 09/21/2016  . Routine general medical examination at a health care facility 03/18/2015  . Hyperlipidemia 09/17/2013  . Hemorrhoid 09/17/2013  . Morbid obesity ([place]) 09/17/2013  . OSA (obstructive sleep apnea) 09/17/2012  . GERD (gastroesophageal reflux disease) 09/17/2012  . Hypertension 09/17/2012    Current Outpatient Medications on File Prior to Visit  Medication Sig Dispense Refill  . amitriptyline (ELAVIL) 10 MG tablet TAKE 2 TABLETS(20 MG) BY MOUTH AT BEDTIME 180 tablet 1  . Cholecalciferol (VITAMIN D3) 50 MCG (2000 UT) TABS Take 1 tablet by mouth daily.    . citalopram (CELEXA) 10 MG tablet TAKE 1 TABLET(10 MG) BY MOUTH DAILY 90 tablet 1  . Coenzyme Q10 (COQ10 PO) Take 1 tablet by mouth every morning.    Marland Kitchen LINZESS 290 MCG CAPS capsule TAKE 1 CAPSULE(290 MCG) BY MOUTH DAILY BEFORE BREAKFAST 30 capsule 6  . metoprolol succinate (TOPROL-XL) 50 MG 24 hr  tablet Take 75 mg by mouth daily. Take with or immediately following a meal.     . olmesartan-hydrochlorothiazide (BENICAR HCT) 40-12.5 MG per tablet Take 1 tablet by mouth daily.    . pantoprazole (PROTONIX) 40 MG tablet TAKE 1 TABLET(40 MG) BY MOUTH DAILY 90 tablet 1  . Polyethyl Glycol-Propyl Glycol (SYSTANE OP) Place 1 drop into both eyes 4 (four) times daily.    . polyethylene glycol powder (GLYCOLAX/MIRALAX) powder MIX 1 CAPFUL IN 8 OUNCES OF WATER AND DRINK ONCE A DAY 527 g 3  . Probiotic Product (PROBIOTIC DAILY PO) Take 1 tablet by mouth daily.    . simvastatin (ZOCOR) 10 MG tablet TAKE 1 TABLET(10 MG) BY MOUTH AT BEDTIME 90 tablet 1  . TURMERIC PO Take 500 mg by mouth daily.    Marland Kitchen Ubiquinol 200 MG CAPS Take by mouth.    . furosemide (LASIX) 20 MG tablet Take 1 tablet (20 mg total) by mouth 3 (three) times a week. Please take on Monday, Wednesday, and Friday 45 tablet 3   No current facility-administered medications on file prior to visit.    Past Medical History:  Diagnosis Date  . GERD (gastroesophageal reflux disease)   . History of colon polyps   . Hyperlipidemia   . Hypertension   . Macular edema, cystoid   . OSA (obstructive sleep apnea) 09/17/2012  . Osteopenia     Past  Surgical History:  Procedure Laterality Date  . CESAREAN SECTION    . COLONOSCOPY  10/29/2013   Colonic polyp status post polypectomy. Mild sigmoid diverticulosis. Small internal hemorrhoids  . FOOT NEUROMA SURGERY     s/p surgery in both feet  . MENISCUS REPAIR Right 09/13/2016  . SHOULDER SURGERY    . TUBAL LIGATION      Social History   Socioeconomic History  . Marital status: Married    Spouse name: Not on file  . Number of children: 2  . Years of education: Not on file  . Highest education level: Not on file  Occupational History  . Not on file  Tobacco Use  . Smoking status: Never Smoker  . Smokeless tobacco: Never Used  Vaping Use  . Vaping Use: Never used  Substance and Sexual  Activity  . Alcohol use: No  . Drug use: No  . Sexual activity: Not Currently  Other Topics Concern  . Not on file  Social History Narrative  . Not on file   Social Determinants of Health   Financial Resource Strain:   . Difficulty of Paying Living Expenses: Not on file  Food Insecurity:   . Worried About Charity fundraiser in the Last Year: Not on file  . Ran Out of Food in the Last Year: Not on file  Transportation Needs:   . Lack of Transportation (Medical): Not on file  . Lack of Transportation (Non-Medical): Not on file  Physical Activity:   . Days of Exercise per Week: Not on file  . Minutes of Exercise per Session: Not on file  Stress:   . Feeling of Stress : Not on file  Social Connections:   . Frequency of Communication with Friends and Family: Not on file  . Frequency of Social Gatherings with Friends and Family: Not on file  . Attends Religious Services: Not on file  . Active Member of Clubs or Organizations: Not on file  . Attends Archivist Meetings: Not on file  . Marital Status: Not on file    Family History  Problem Relation Age of Onset  . Hypertension Mother   . Atrial fibrillation Mother   . Diabetes Mother   . Colon cancer Neg Hx   . Esophageal cancer Neg Hx     Review of Systems  Constitutional: Negative for fever.  Musculoskeletal: Negative for arthralgias and joint swelling.  Skin: Negative for color change.  Neurological: Positive for weakness (right hand worse in am). Negative for numbness.       Objective:   Vitals:   11/10/19 0920  BP: 112/62  Pulse: 63  Temp: 98.6 F (37 C)  SpO2: 95%   BP Readings from Last 3 Encounters:  11/10/19 112/62  08/11/19 112/68  07/18/19 118/64   Wt Readings from Last 3 Encounters:  11/10/19 206 lb (93.4 kg)  08/11/19 (!) 205 lb 12.8 oz (93.4 kg)  07/18/19 203 lb 12.8 oz (92.4 kg)   Body mass index is 37.08 kg/m.   Physical Exam Constitutional:      General: She is not in  acute distress.    Appearance: Normal appearance. She is not ill-appearing.  Cardiovascular:     Pulses: Normal pulses.  Musculoskeletal:        General: No deformity.     Comments: FROM of right shoulder, elbow and wrist w/o pain.  No tenderness with palpation in upper and lower arms.  Slight pain in forearm muscle with pronation  Skin:    General: Skin is warm and dry.     Findings: No bruising or erythema.  Neurological:     Mental Status: She is alert.     Sensory: No sensory deficit.     Motor: No weakness.            Assessment & Plan:    See Problem List for Assessment and Plan of chronic medical problems.    This visit occurred during the SARS-CoV-2 public health emergency.  Safety protocols were in place, including screening questions prior to the visit, additional usage of staff PPE, and extensive cleaning of exam room while observing appropriate contact time as indicated for disinfecting solutions.

## 2019-11-10 ENCOUNTER — Other Ambulatory Visit: Payer: Self-pay

## 2019-11-10 ENCOUNTER — Encounter: Payer: Self-pay | Admitting: Internal Medicine

## 2019-11-10 ENCOUNTER — Ambulatory Visit (INDEPENDENT_AMBULATORY_CARE_PROVIDER_SITE_OTHER): Payer: Medicare Other | Admitting: Internal Medicine

## 2019-11-10 VITALS — BP 112/62 | HR 63 | Temp 98.6°F | Ht 62.5 in | Wt 206.0 lb

## 2019-11-10 DIAGNOSIS — M79601 Pain in right arm: Secondary | ICD-10-CM | POA: Insufficient documentation

## 2019-11-10 DIAGNOSIS — Z23 Encounter for immunization: Secondary | ICD-10-CM

## 2019-11-10 MED ORDER — TIZANIDINE HCL 2 MG PO TABS: 2.0000 mg | ORAL_TABLET | Freq: Every day | ORAL | 0 refills | Status: DC

## 2019-11-10 NOTE — Patient Instructions (Addendum)
Flu immunization administered today.     Try a topical muscle pain medication such as biofreeze or salon pas.   Try the muscle relaxer at night.  Tizanidine 2 mg.     A referral was ordered for sports medicine.  They will call you to schedule.

## 2019-11-10 NOTE — Assessment & Plan Note (Signed)
Acute Pain x 1 month or more in right forearm, occ shoulder, wrist pain Started after stirring persimmons but has lasted despite not doing that activity in a while Worse first thing in the morning - it feels like her muscles in her forearm are very tight - this is painful -- it gets better during day Pain with pronation Trial of tizanidine at night Apply salon pas or biofreeze at night Referred to sports medicine

## 2019-11-11 ENCOUNTER — Other Ambulatory Visit: Payer: Self-pay

## 2019-11-11 ENCOUNTER — Encounter: Payer: Self-pay | Admitting: Family Medicine

## 2019-11-11 ENCOUNTER — Ambulatory Visit: Payer: Self-pay

## 2019-11-11 ENCOUNTER — Ambulatory Visit (INDEPENDENT_AMBULATORY_CARE_PROVIDER_SITE_OTHER): Payer: Medicare Other | Admitting: Family Medicine

## 2019-11-11 VITALS — BP 126/80 | HR 62 | Ht 62.5 in | Wt 208.0 lb

## 2019-11-11 DIAGNOSIS — M79601 Pain in right arm: Secondary | ICD-10-CM

## 2019-11-11 MED ORDER — GABAPENTIN 100 MG PO CAPS: 100.0000 mg | ORAL_CAPSULE | Freq: Every evening | ORAL | 3 refills | Status: DC | PRN

## 2019-11-11 NOTE — Progress Notes (Signed)
    Subjective:    CC: R forearm pain  I, Judy Pimple, am serving as a Education administrator for Dr. [name].  HPI: Pt is a 74 y/o female presenting w/ c/o R forearm pain x approximately one month that began after stirring persimmons. States has started beginners yoga class and the last times she went did a lit of floor stuff that had all her weight on her upper body. She locates her pain to Lateral forearm but radiates up her arm into her shoulder.  Radiating pain: yes up her arm  R UE numbness/tingling: No R UE weakness: yes in her hand in the morning  Aggravating factors: R forearm pronation; grasping holding things  Treatments tried: Tylenol; Advil; Tizanidine;  Pertinent review of Systems: No fevers or chills  Relevant historical information: Hypertension   Objective:    Vitals:   11/11/19 1509  BP: 126/80  Pulse: 62  SpO2: 98%   General: Well Developed, well nourished, and in no acute distress.   MSK: C-spine normal-appearing nontender normal cervical motion. Upper semistrength reflexes and sensation are equal normal throughout. Shoulder normal-appearing nontender normal motion normal strength. Right elbow normal-appearing nontender normal strength and motion. Pulses cap refill and sensation and grip are intact distally. Unable to reproduce pain with resisted wrist and hand flexion and extension pronation and supination  Forearm: Tender palpation mildly volar medial forearm near pronator. Not particularly tender palpation lateral epicondyle or medial epicondyle.  Impression and Recommendations:    Assessment and Plan: 75 y.o. female with forearm pain. Diagnosis is somewhat unclear at this time. Suspect simple strain and overuse of 4 musculature both on the volar and dorsal forearm due to the increased activity with ground-floor exercises as part of yoga. She does have pain in the pronator area as well as diffusely into the dorsal forearms. Discussed options. Plan for home  exercise program and trial of gabapentin at bedtime. Will prescribe prednisone as neck step if needed. Ultimately may need advanced imaging including x-ray MRI etc. if not improved. Recheck back if not improving. Also consider formal physical therapy.  PDMP not reviewed this encounter. No orders of the defined types were placed in this encounter.  Meds ordered this encounter  Medications  . gabapentin (NEURONTIN) 100 MG capsule    Sig: Take 1-3 capsules (100-300 mg total) by mouth at bedtime as needed (nerve pain).    Dispense:  30 capsule    Refill:  3    Discussed warning signs or symptoms. Please see discharge instructions. Patient expresses understanding.   The above documentation has been reviewed and is accurate and complete [name], M.D.

## 2019-11-11 NOTE — Patient Instructions (Addendum)
Thank you for coming in today.  I think this is a strain for the forearm muscles including Pronator teres.  Work on home exercises.   Please perform the exercise program that we have prepared for you and gone over in detail on a daily basis.  In addition to the handout you were provided you can access your program through: [email or web address]   Your unique program code is:  [email or web address]  Try the gabapentin at bedtime as needed for nerve pain.   If not better would prescribe prednisone. Let me know.   Also would plan for physical therapy.   Keep me updated.  Recheck if not improving.

## 2019-11-14 DIAGNOSIS — Z23 Encounter for immunization: Secondary | ICD-10-CM | POA: Diagnosis not present

## 2019-11-15 ENCOUNTER — Other Ambulatory Visit: Payer: Self-pay | Admitting: Internal Medicine

## 2019-11-24 ENCOUNTER — Encounter: Payer: Self-pay | Admitting: Family Medicine

## 2019-11-24 NOTE — Progress Notes (Signed)
I, Wendy Poet, LAT, ATC, am serving as scribe for Dr. [name].  Diana Hendrix is a 74 y.o. female who presents to [place] at [facility] today for f/u of R forearm.  She was last seen by Dr. Georgina Snell on 11/11/19 and was prescribed Gabapentin.  She was advised that we could proceed to PT or do a prednisone prescription if not improving.  Since her last visit, pt reports her R arm pain remains unchanged / slightly worse.  Today her pain is radiating from her R dorsal forearm to her R upper arm.  She denies any numbness/tingling into her R UE and denies any neck pain.     Pertinent review of systems: No fevers or chills  Relevant historical information: Obesity, history of syncope following knee injection, hypertension, sleep apnea.  Has been on simvastatin for years without change in dose.  Of note she did have an allergy in her chart for cortisone.  We discussed she describes an episode where she passed out after receiving a knee injection.  She has had subsequent steroid injections with no ill effects.  She thinks this is not a true allergy would like it removed.  Exam:  BP 118/78 (BP Location: Left Arm, Patient Position: Sitting, Cuff Size: Large)   Pulse 61   Ht 5' 2.5" (1.588 m)   Wt 208 lb (94.3 kg)   SpO2 96%   BMI 37.44 kg/m  General: Well Developed, well nourished, and in no acute distress.   MSK: C-spine normal-appearing nontender midline.  Normal cervical motion. Upper extremity strength reflexes and sensation are equal normal throughout. Right shoulder normal-appearing nontender normal motion.  Normal strength.  Mildly positive Hawkins and Neer's testing. Right elbow normal-appearing nontender normal motion and strength. No pain at lateral epicondyle with resisted wrist extension. No pain in anterior elbow with resisted supination or elbow flexion. Right wrist normal-appearing not particularly tender.  Normal wrist motion and strength. Negative  Finkelstein's test.  Negative Tinel's test. Distally hand strength is intact.  Sensation and cap refill and pulses are intact.    Lab and Radiology Results X-ray images C-spine and right wrist obtained today personally and independently interpreted  C-spine: Mild DDD C4-C5.  No acute fractures or significant malalignment.  Right wrist: No fractures.  Mild degenerative change scapholunate joint.  Await formal radiology review  Diagnostic Limited MSK Ultrasound of: Right elbow and wrist Right elbow: Lateral epicondyle visualized.  Intact extensor tendon insertion.  Slight hyperechoic change distal tip of lateral epicondyle consistent with mild lateral epicondylitis. Anterior elbow normal-appearing Dorsal wrist: Normal-appearing dorsal wrist structures.  No significant tenosynovitis visible. Impression: Possible lateral epicondylitis otherwise wrist and elbow normal-appearing     Assessment and Plan: 74 y.o. female with right arm pain ongoing for approximately 6 weeks.  At this time the etiology remains unclear.  She has diffuse soreness from her shoulder to the wrist mostly on the dorsal aspect of the forearm but some of the volar forearm.  She does not have significant point tenderness along her arm and has preserved strength without ability to reproduce pain with resisted strength or provocative testing in the shoulder elbow forearm and hand.  Additionally she has preserved cervical motion and absent Spurling's test or weakness.    Differential includes isolated individual orthopedic injury in the elbow or wrist, cervical radiculopathy, more diffuse myalgias or myopathy.  Plan to broaden work-up with limited laboratory rheumatologic work-up listed below, x-ray C-spine and elbow.  We will  start treatment trial with prednisone and referral to physical therapy.  Recheck back in 4 to 6 weeks if not improving.   PDMP not reviewed this encounter. Orders Placed This Encounter  Procedures    . [place] LIMITED JOINT SPACE STRUCTURES UP RIGHT(NO LINKED CHARGES)    Order Specific Question:   Reason for Exam (SYMPTOM  OR DIAGNOSIS REQUIRED)    Answer:   R forearm pain    Order Specific Question:   Preferred imaging location?    Answer:   [place]  . DG Wrist Complete Right    Standing Status:   Future    Number of Occurrences:   1    Standing Expiration Date:   11/24/2020    Order Specific Question:   Reason for Exam (SYMPTOM  OR DIAGNOSIS REQUIRED)    Answer:   eval wrist pain    Order Specific Question:   Preferred imaging location?    Answer:   [facility]  . DG Cervical Spine Complete    Standing Status:   Future    Number of Occurrences:   1    Standing Expiration Date:   11/24/2020    Order Specific Question:   Reason for Exam (SYMPTOM  OR DIAGNOSIS REQUIRED)    Answer:   eval poss right cerv rad poss C6    Order Specific Question:   Preferred imaging location?    Answer:   [facility]  . CBC    Standing Status:   Future    Number of Occurrences:   1    Standing Expiration Date:   11/24/2020  . Comp Met (CMET)  . Sedimentation rate    Standing Status:   Future    Number of Occurrences:   1    Standing Expiration Date:   11/24/2020  . Rheumatoid factor    Standing Status:   Future    Number of Occurrences:   1    Standing Expiration Date:   11/24/2020  . ANA    Standing Status:   Future    Number of Occurrences:   1    Standing Expiration Date:   11/24/2020  . CK    Standing Status:   Future    Number of Occurrences:   1    Standing Expiration Date:   11/24/2020  . Ambulatory referral to Physical Therapy    Referral Priority:   Routine    Referral Type:   Physical Medicine    Referral Reason:   Specialty Services Required    Requested Specialty:   Physical Therapy    Number of Visits Requested:   1   Meds ordered this encounter  Medications  . predniSONE (DELTASONE) 50 MG tablet    Sig: Take 1 tablet (50 mg total)  by mouth daily.    Dispense:  5 tablet    Refill:  0     Discussed warning signs or symptoms. Please see discharge instructions. Patient expresses understanding.   The above documentation has been reviewed and is accurate and complete [name], M.D.

## 2019-11-25 ENCOUNTER — Encounter: Payer: Self-pay | Admitting: Family Medicine

## 2019-11-25 ENCOUNTER — Ambulatory Visit: Payer: Self-pay

## 2019-11-25 ENCOUNTER — Ambulatory Visit (INDEPENDENT_AMBULATORY_CARE_PROVIDER_SITE_OTHER): Payer: Medicare Other | Admitting: Family Medicine

## 2019-11-25 ENCOUNTER — Ambulatory Visit (INDEPENDENT_AMBULATORY_CARE_PROVIDER_SITE_OTHER): Payer: Medicare Other

## 2019-11-25 ENCOUNTER — Other Ambulatory Visit: Payer: Self-pay

## 2019-11-25 VITALS — BP 118/78 | HR 61 | Ht 62.5 in | Wt 208.0 lb

## 2019-11-25 DIAGNOSIS — R768 Other specified abnormal immunological findings in serum: Secondary | ICD-10-CM | POA: Diagnosis not present

## 2019-11-25 DIAGNOSIS — M50323 Other cervical disc degeneration at C6-C7 level: Secondary | ICD-10-CM | POA: Diagnosis not present

## 2019-11-25 DIAGNOSIS — M79631 Pain in right forearm: Secondary | ICD-10-CM

## 2019-11-25 DIAGNOSIS — R700 Elevated erythrocyte sedimentation rate: Secondary | ICD-10-CM

## 2019-11-25 DIAGNOSIS — M25531 Pain in right wrist: Secondary | ICD-10-CM | POA: Diagnosis not present

## 2019-11-25 LAB — SEDIMENTATION RATE: Sed Rate: 50 mm/hr — ABNORMAL HIGH (ref 0–30)

## 2019-11-25 LAB — CBC
HCT: 39.1 % (ref 36.0–46.0)
Hemoglobin: 13.4 g/dL (ref 12.0–15.0)
MCHC: 34.2 g/dL (ref 30.0–36.0)
MCV: 89.6 fl (ref 78.0–100.0)
Platelets: 282 10*3/uL (ref 150.0–400.0)
RBC: 4.36 Mil/uL (ref 3.87–5.11)
RDW: 13.4 % (ref 11.5–15.5)
WBC: 6.1 10*3/uL (ref 4.0–10.5)

## 2019-11-25 LAB — COMPREHENSIVE METABOLIC PANEL
ALT: 21 U/L (ref 0–35)
AST: 24 U/L (ref 0–37)
Albumin: 4.3 g/dL (ref 3.5–5.2)
Alkaline Phosphatase: 74 U/L (ref 39–117)
BUN: 12 mg/dL (ref 6–23)
CO2: 27 mEq/L (ref 19–32)
Calcium: 9.4 mg/dL (ref 8.4–10.5)
Chloride: 96 mEq/L (ref 96–112)
Creatinine, Ser: 1 mg/dL (ref 0.40–1.20)
GFR: 55.96 mL/min — ABNORMAL LOW (ref >60.00)
Glucose, Bld: 83 mg/dL (ref 70–99)
Potassium: 4.2 mEq/L (ref 3.5–5.1)
Sodium: 130 mEq/L — ABNORMAL LOW (ref 135–145)
Total Bilirubin: 0.5 mg/dL (ref 0.2–1.2)
Total Protein: 7.2 g/dL (ref 6.0–8.3)

## 2019-11-25 LAB — CK: Total CK: 84 U/L (ref 7–177)

## 2019-11-25 MED ORDER — PREDNISONE 50 MG PO TABS: 50.0000 mg | ORAL_TABLET | Freq: Every day | ORAL | 0 refills | Status: DC

## 2019-11-25 NOTE — Patient Instructions (Signed)
Thank you for coming in today.  Plan for prednisone.   Please get labs today before you leave  I've referred you to Physical Therapy.  Let us know if you don't hear from them in one week.  Please get an Xray today before you leave  Recheck in 4 weeks or sooner if needed.   Keep me updated.

## 2019-11-26 NOTE — Progress Notes (Signed)
Some labs are still pending however some labs are back.Sedimentation rate a marker of general inflammation is minimally elevated. Sodium is a bit low but stable over the last 5 months Muscle irritation labs normal.

## 2019-11-26 NOTE — Progress Notes (Signed)
X-ray cervical spine shows some mild arthritis otherwise normal-appearing

## 2019-11-26 NOTE — Progress Notes (Signed)
X-ray right wrist normal per radiology

## 2019-11-27 LAB — ANTI-NUCLEAR AB-TITER (ANA TITER): ANA Titer 1: 1:160 {titer} — ABNORMAL HIGH

## 2019-11-27 LAB — ANA: Anti Nuclear Antibody (ANA): POSITIVE — AB

## 2019-11-27 LAB — RHEUMATOID FACTOR: Rheumatoid fact SerPl-aCnc: <14 IU/mL (ref <14)

## 2019-11-27 NOTE — Addendum Note (Signed)
Addended by: Gregor Hams on: 11/27/2019 03:49 PM   Modules accepted: Orders

## 2019-11-27 NOTE — Progress Notes (Signed)
ANA which is a lab that can detect lupus as well as other conditions and is medium to significantly elevated.  This plus the mildly elevated sedimentation rate makes me concerned for a rheumatologic process.  I have placed a referral to rheumatology.  You should hear soon about scheduling.  Please let me know if they cannot get you in in a timely manner.

## 2019-11-28 ENCOUNTER — Ambulatory Visit: Payer: Medicare Other | Admitting: Internal Medicine

## 2019-12-03 DIAGNOSIS — M25521 Pain in right elbow: Secondary | ICD-10-CM | POA: Diagnosis not present

## 2019-12-03 DIAGNOSIS — M25511 Pain in right shoulder: Secondary | ICD-10-CM | POA: Diagnosis not present

## 2019-12-03 DIAGNOSIS — M25531 Pain in right wrist: Secondary | ICD-10-CM | POA: Diagnosis not present

## 2019-12-05 DIAGNOSIS — M25521 Pain in right elbow: Secondary | ICD-10-CM | POA: Diagnosis not present

## 2019-12-05 DIAGNOSIS — S56211D Strain of other flexor muscle, fascia and tendon at forearm level, right arm, subsequent encounter: Secondary | ICD-10-CM | POA: Diagnosis not present

## 2019-12-05 DIAGNOSIS — M25631 Stiffness of right wrist, not elsewhere classified: Secondary | ICD-10-CM | POA: Diagnosis not present

## 2019-12-05 DIAGNOSIS — M25531 Pain in right wrist: Secondary | ICD-10-CM | POA: Diagnosis not present

## 2019-12-05 DIAGNOSIS — M25511 Pain in right shoulder: Secondary | ICD-10-CM | POA: Diagnosis not present

## 2019-12-05 DIAGNOSIS — M25621 Stiffness of right elbow, not elsewhere classified: Secondary | ICD-10-CM | POA: Diagnosis not present

## 2019-12-07 DIAGNOSIS — S56211D Strain of other flexor muscle, fascia and tendon at forearm level, right arm, subsequent encounter: Secondary | ICD-10-CM | POA: Diagnosis not present

## 2019-12-07 DIAGNOSIS — M25531 Pain in right wrist: Secondary | ICD-10-CM | POA: Diagnosis not present

## 2019-12-07 DIAGNOSIS — M25631 Stiffness of right wrist, not elsewhere classified: Secondary | ICD-10-CM | POA: Diagnosis not present

## 2019-12-07 DIAGNOSIS — M25521 Pain in right elbow: Secondary | ICD-10-CM | POA: Diagnosis not present

## 2019-12-07 DIAGNOSIS — M25511 Pain in right shoulder: Secondary | ICD-10-CM | POA: Diagnosis not present

## 2019-12-07 DIAGNOSIS — M25621 Stiffness of right elbow, not elsewhere classified: Secondary | ICD-10-CM | POA: Diagnosis not present

## 2019-12-09 NOTE — Progress Notes (Signed)
Office Visit Note  Patient: Diana Hendrix             Date of Birth: [birth year]           MRN: [id number]             PCP: Hoyt Koch, MD Referring: Gregor Hams, MD Visit Date: 12/10/2019  Subjective:  New Patient (Initial Visit) and Arm Pain (Right arm)   History of Present Illness: Diana Hendrix is a 74 y.o. female here for evaluation of positive ANA associated with right arm pain.  She states the arm pain is ongoing since about 3 months ago she associates it beginning after a yoga exercise session that started wrist and elbow and shoulder pain of the right side.  Since then she has had persistent pain mostly between the elbow and wrist of the right arm.  This pain is mostly provoked with use she has minimal morning stiffness of only a few minutes.  She has noticed some swelling in the right forearm compared to the left.  X-ray imaging did not show any bony abnormality in the wrist that explain her symptoms.  Right elbow ultrasound was obtained consistent with lateral epicondylitis but no obvious wrist changes.  Besides this she is not complaining of significant joint pain in her other extremities. She has not noticed skin rashes, oral ulcers, hair loss, eye redness, raynaud's phenomenon, or pleurisy.  Labs reviewed 11/2019 ANA 1:160 nuclear, homogenous ESR 50 CK 84 RF negative CMP Na 130    Activities of Daily Living:  Patient reports morning stiffness for 5-10 minutes.   Patient Denies nocturnal pain.  Difficulty dressing/grooming: Denies Difficulty climbing stairs: Denies Difficulty getting out of chair: Denies Difficulty using hands for taps, buttons, cutlery, and/or writing: Denies  Review of Systems  Constitutional: Positive for fatigue.  HENT: Positive for mouth dryness. Negative for mouth sores and nose dryness.   Eyes: Positive for visual disturbance. Negative for pain, itching and dryness.  Respiratory: Negative for cough, hemoptysis, shortness of  breath and difficulty breathing.   Cardiovascular: Negative for chest pain, palpitations and swelling in legs/feet.  Gastrointestinal: Negative for abdominal pain, blood in stool, constipation and diarrhea.  Endocrine: Negative for increased urination.  Genitourinary: Negative for painful urination.  Musculoskeletal: Positive for arthralgias, joint pain, joint swelling, myalgias, morning stiffness, muscle tenderness and myalgias. Negative for muscle weakness.  Skin: Negative for color change, rash and redness.  Allergic/Immunologic: Negative for susceptible to infections.  Neurological: Positive for weakness. Negative for dizziness, numbness, headaches and memory loss.  Hematological: Negative for swollen glands.  Psychiatric/Behavioral: Negative for confusion and sleep disturbance.    PMFS History:  Patient Active Problem List   Diagnosis Date Noted  . Positive ANA (antinuclear antibody) 12/10/2019  . Elevated sedimentation rate 12/10/2019  . Pain of right upper extremity 11/10/2019  . Plantar fasciitis 09/09/2019  . Heel cord tightness, right 09/09/2019  . Benign essential hypertension 08/14/2019  . Dyspnea on exertion 08/11/2019  . Chest tightness 04/25/2019  . 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 Constipation 10/16/2018  . Vasovagal syncope 04/16/2017  . Impaired fasting blood sugar 10/11/2016  . S/P right knee arthroscopy 09/21/2016  . Other specified postprocedural states 09/21/2016  . Routine general medical examination at a health care facility 03/18/2015  . Hyperlipidemia 09/17/2013  . Hemorrhoid 09/17/2013  . Morbid obesity ([place]) 09/17/2013  . OSA (obstructive sleep apnea) 09/17/2012  . GERD (gastroesophageal reflux disease) 09/17/2012  . Hypertension 09/17/2012  Past Medical History:  Diagnosis Date  . GERD (gastroesophageal reflux disease)   . History of colon polyps   . Hyperlipidemia   . Hypertension   . Macular edema, cystoid   . OSA (obstructive sleep  apnea) 09/17/2012  . Osteopenia     Family History  Problem Relation Age of Onset  . Hypertension Mother   . Atrial fibrillation Mother   . Hypertension Brother   . Colon cancer Neg Hx   . Esophageal cancer Neg Hx    Past Surgical History:  Procedure Laterality Date  . CESAREAN SECTION    . COLONOSCOPY  10/29/2013   Colonic polyp status post polypectomy. Mild sigmoid diverticulosis. Small internal hemorrhoids  . FOOT NEUROMA SURGERY     s/p surgery in both feet  . FOOT SURGERY Bilateral    Per patient  . MENISCUS REPAIR Right 09/13/2016  . SHOULDER SURGERY    . TUBAL LIGATION     Social History   Social History Narrative  . Not on file   Immunization History  Administered Date(s) Administered  . Fluad Quad(high Dose 65+) 10/16/2018, 11/10/2019  . Influenza, High Dose Seasonal PF 10/09/2016, 10/04/2017  . Influenza,inj,Quad PF,6+ Mos 10/25/2012, 09/17/2013, 09/21/2015  . Influenza-Unspecified 12/12/2014  . Moderna SARS-COVID-2 Vaccination 02/28/2019, 03/28/2019, 11/14/2019  . Pneumococcal Conjugate-13 03/18/2015  . Pneumococcal Polysaccharide-23 02/09/2011, 09/17/2013  . Tdap 03/18/2015  . Zoster 09/17/2011     Objective: Vital Signs: BP (!) 94/55 (BP Location: Left Arm, Patient Position: Sitting, Cuff Size: Small)   Pulse 66   Ht 5' 2.5" (1.588 m)   Wt 207 lb 6.4 oz (94.1 kg)   BMI 37.33 kg/m    Physical Exam HENT:     Right Ear: External ear normal.     Left Ear: External ear normal.     Mouth/Throat:     Mouth: Mucous membranes are moist.     Pharynx: Oropharynx is clear.  Eyes:     Conjunctiva/sclera: Conjunctivae normal.  Cardiovascular:     Rate and Rhythm: Normal rate and regular rhythm.  Pulmonary:     Breath sounds: Normal breath sounds.  Skin:    General: Skin is warm and dry.     Findings: No rash.  Neurological:     General: No focal deficit present.     Mental Status: She is alert.  Psychiatric:        Mood and Affect: Mood normal.       Musculoskeletal Exam: Shoulder, elbow, wrist, fingers full range of motion no tenderness or swelling Wrist pain provoked with resisted flexion or extension, no point tenderness to pressure   Investigation: No additional findings.  Imaging: DG Cervical Spine Complete  Result Date: 11/25/2019 CLINICAL DATA:  Cervicalgia EXAM: CERVICAL SPINE - COMPLETE 4+ VIEW COMPARISON:  None. FINDINGS: Frontal, lateral, open-mouth odontoid, and bilateral oblique views were obtained. There is no fracture or spondylolisthesis. Prevertebral soft tissues and predental space regions are normal. There is slight disc space narrowing at C6-7 and C7-T1. Other disc spaces appear unremarkable. Prominent osteophyte noted anteriorly and inferiorly at C4. There is no appreciable exit foraminal narrowing on the oblique views. Lung apices are clear. IMPRESSION: Areas of relatively mild osteoarthritic change. No fracture or spondylolisthesis. Electronically Signed   By: Lowella Grip III M.D.   On: 11/25/2019 20:31   DG Wrist Complete Right  Result Date: 11/25/2019 CLINICAL DATA:  Pain EXAM: RIGHT WRIST - COMPLETE 3+ VIEW COMPARISON:  None. FINDINGS: Frontal, oblique, lateral, and ulnar  deviation scaphoid images were obtained. No fracture or dislocation. Joint spaces appear normal. No erosive change. IMPRESSION: No fracture or dislocation.  No evident arthropathy. Electronically Signed   By: Lowella Grip III M.D.   On: 11/25/2019 20:29   [place] LIMITED JOINT SPACE STRUCTURES UP RIGHT(NO LINKED CHARGES)  Result Date: 12/08/2019 Diagnostic Limited MSK Ultrasound of: Right elbow and wrist Right elbow: Lateral epicondyle visualized.  Intact extensor tendon insertion.  Slight hyperechoic change distal tip of lateral epicondyle consistent with mild lateral epicondylitis. Anterior elbow normal-appearing Dorsal wrist: Normal-appearing dorsal wrist structures.  No significant tenosynovitis visible. Impression: Possible lateral  epicondylitis otherwise wrist and elbow normal-appearing   Recent Labs: Lab Results  Component Value Date   WBC 6.1 11/25/2019   HGB 13.4 11/25/2019   PLT 282.0 11/25/2019   NA 130 (L) 11/25/2019   K 4.2 11/25/2019   CL 96 11/25/2019   CO2 27 11/25/2019   GLUCOSE 83 11/25/2019   BUN 12 11/25/2019   CREATININE 1.00 11/25/2019   BILITOT 0.5 11/25/2019   ALKPHOS 74 11/25/2019   AST 24 11/25/2019   ALT 21 11/25/2019   PROT 7.2 11/25/2019   ALBUMIN 4.3 11/25/2019   CALCIUM 9.4 11/25/2019   GFRAA 67 07/18/2019    Speciality Comments: No specialty comments available.  Procedures:  No procedures performed Allergies: Ibuprofen   Assessment / Plan:     Visit Diagnoses: Pain of right upper extremity   Her pain currently sounds most consistent with a tendon or ligament injury based on the normal xray and no swelling on exam but persistent pain for months that is related to use. No specific treatment recommended from an inflammatory disease process. She has follow up planned with Dr. Georgina Snell for this scheduled.  Positive ANA (antinuclear antibody) Elevated sedimentation rate - Plan: Anti-Smith antibody, Sjogrens syndrome-A extractable nuclear antibody, Sjogrens syndrome-B extractable nuclear antibody, Anti-DNA antibody, double-stranded, C3 and C4  Right arm pain seems less likely inflammatory disease but no clear alternative cause of lab findings. Will check ENA titers and complements for serologic evidence of specific disease or activity. If negative would not plan any additional rheumatology workup at this time.  Orders: Orders Placed This Encounter  Procedures  . Anti-Smith antibody  . Sjogrens syndrome-A extractable nuclear antibody  . Sjogrens syndrome-B extractable nuclear antibody  . Anti-DNA antibody, double-stranded  . C3 and C4   No orders of the defined types were placed in this encounter.   Follow-Up Instructions: No follow-ups on file.   Collier Salina,  MD  Note - This record has been created using [employer].  Chart creation errors have been sought, but may not always  have been located. Such creation errors do not reflect on  the standard of medical care.

## 2019-12-10 ENCOUNTER — Encounter: Payer: Self-pay | Admitting: Internal Medicine

## 2019-12-10 ENCOUNTER — Ambulatory Visit (INDEPENDENT_AMBULATORY_CARE_PROVIDER_SITE_OTHER): Payer: Medicare Other | Admitting: Internal Medicine

## 2019-12-10 ENCOUNTER — Other Ambulatory Visit: Payer: Self-pay

## 2019-12-10 VITALS — BP 94/55 | HR 66 | Ht 62.5 in | Wt 207.4 lb

## 2019-12-10 DIAGNOSIS — R768 Other specified abnormal immunological findings in serum: Secondary | ICD-10-CM

## 2019-12-10 DIAGNOSIS — M25511 Pain in right shoulder: Secondary | ICD-10-CM | POA: Diagnosis not present

## 2019-12-10 DIAGNOSIS — M79601 Pain in right arm: Secondary | ICD-10-CM

## 2019-12-10 DIAGNOSIS — R700 Elevated erythrocyte sedimentation rate: Secondary | ICD-10-CM | POA: Diagnosis not present

## 2019-12-10 DIAGNOSIS — M25521 Pain in right elbow: Secondary | ICD-10-CM | POA: Diagnosis not present

## 2019-12-10 DIAGNOSIS — M25631 Stiffness of right wrist, not elsewhere classified: Secondary | ICD-10-CM | POA: Diagnosis not present

## 2019-12-10 DIAGNOSIS — S56211D Strain of other flexor muscle, fascia and tendon at forearm level, right arm, subsequent encounter: Secondary | ICD-10-CM | POA: Diagnosis not present

## 2019-12-10 DIAGNOSIS — M25621 Stiffness of right elbow, not elsewhere classified: Secondary | ICD-10-CM | POA: Diagnosis not present

## 2019-12-10 DIAGNOSIS — M25531 Pain in right wrist: Secondary | ICD-10-CM | POA: Diagnosis not present

## 2019-12-10 NOTE — Patient Instructions (Addendum)
I do not see evidence of inflammatory changes in the right arm or systemic autoimmune disease at this time. The positive ANA test and sedimentation rate test can be seen with inflammatory diseases but do not prove you have a disease unless symptoms are also present. If results are normal we do not need to follow up further so will call after reviewing these.   Antinuclear Antibody Test Why am I having this test? This is a test that is used to help diagnose systemic lupus erythematosus (SLE) and other autoimmune diseases. An autoimmune disease is a disease in which the body's own defense (immune)system attacks its organs. What is being tested? This test checks for antinuclear antibodies (ANA) in the blood. The presence of ANA is associated with several autoimmune diseases. It is seen in almost all patients with lupus. What kind of sample is taken?  A blood sample is required for this test. It is usually collected by inserting a needle into a blood vessel. How are the results reported? Your test results will be reported as either positive or negative. A false-positive result can occur. A false positive is incorrect because it means that a condition is present when it is not. What do the results mean? A positive test result may mean that you have:  Lupus.  Other autoimmune diseases, such as rheumatoid arthritis, scleroderma, or Sjgren syndrome. Conditions that may cause a false-positive result include:  Liver dysfunction.  Myasthenia gravis.  Infectious mononucleosis. Talk with your health care provider about what your results mean. Questions to ask your health care provider Ask your health care provider, or the department that is doing the test:  When will my results be ready?  How will I get my results?  What are my treatment options?  What other tests do I need?  What are my next steps? Summary  This is a test that is used to help diagnose systemic lupus erythematosus (SLE)  and other autoimmune diseases. An autoimmune disease is a disease in which the body's own defense (immune)system attacks the body.  This test checks for antinuclear antibodies (ANA) in the blood. The presence of ANA is associated with several autoimmune diseases. It is seen in almost all patients with lupus.  Your test results will be reported as either positive or negative. Talk with your health care provider about what your results mean. This information is not intended to replace advice given to you by your health care provider. Make sure you discuss any questions you have with your health care provider. Document Revised: 12/15/2016 Document Reviewed: 08/31/2016 Elsevier Patient Education  [place].

## 2019-12-12 LAB — C3 AND C4
C3 Complement: 175 mg/dL (ref 83–193)
C4 Complement: 34 mg/dL (ref 15–57)

## 2019-12-12 LAB — ANTI-SMITH ANTIBODY: ENA SM Ab Ser-aCnc: <1 AI

## 2019-12-12 LAB — ANTI-DNA ANTIBODY, DOUBLE-STRANDED: ds DNA Ab: <1 IU/mL

## 2019-12-12 LAB — SJOGRENS SYNDROME-A EXTRACTABLE NUCLEAR ANTIBODY: SSA (Ro) (ENA) Antibody, IgG: <1 AI

## 2019-12-12 LAB — SJOGRENS SYNDROME-B EXTRACTABLE NUCLEAR ANTIBODY: SSB (La) (ENA) Antibody, IgG: <1 AI

## 2019-12-15 DIAGNOSIS — M25521 Pain in right elbow: Secondary | ICD-10-CM | POA: Diagnosis not present

## 2019-12-15 DIAGNOSIS — M25511 Pain in right shoulder: Secondary | ICD-10-CM | POA: Diagnosis not present

## 2019-12-15 DIAGNOSIS — S56211D Strain of other flexor muscle, fascia and tendon at forearm level, right arm, subsequent encounter: Secondary | ICD-10-CM | POA: Diagnosis not present

## 2019-12-15 DIAGNOSIS — M25621 Stiffness of right elbow, not elsewhere classified: Secondary | ICD-10-CM | POA: Diagnosis not present

## 2019-12-15 DIAGNOSIS — M25631 Stiffness of right wrist, not elsewhere classified: Secondary | ICD-10-CM | POA: Diagnosis not present

## 2019-12-15 DIAGNOSIS — M25531 Pain in right wrist: Secondary | ICD-10-CM | POA: Diagnosis not present

## 2019-12-15 NOTE — Progress Notes (Signed)
Lab tests including antibody tests related to disease such as lupus were all negative. Based on this and our examination I have no evidence of autoimmune disease. She should continue to follow up with her regular office for the right arm pain.

## 2019-12-19 DIAGNOSIS — M25621 Stiffness of right elbow, not elsewhere classified: Secondary | ICD-10-CM | POA: Diagnosis not present

## 2019-12-19 DIAGNOSIS — S56211D Strain of other flexor muscle, fascia and tendon at forearm level, right arm, subsequent encounter: Secondary | ICD-10-CM | POA: Diagnosis not present

## 2019-12-19 DIAGNOSIS — M25511 Pain in right shoulder: Secondary | ICD-10-CM | POA: Diagnosis not present

## 2019-12-19 DIAGNOSIS — M25521 Pain in right elbow: Secondary | ICD-10-CM | POA: Diagnosis not present

## 2019-12-19 DIAGNOSIS — M25631 Stiffness of right wrist, not elsewhere classified: Secondary | ICD-10-CM | POA: Diagnosis not present

## 2019-12-19 DIAGNOSIS — M25531 Pain in right wrist: Secondary | ICD-10-CM | POA: Diagnosis not present

## 2019-12-22 DIAGNOSIS — M25531 Pain in right wrist: Secondary | ICD-10-CM | POA: Diagnosis not present

## 2019-12-22 DIAGNOSIS — M25521 Pain in right elbow: Secondary | ICD-10-CM | POA: Diagnosis not present

## 2019-12-22 DIAGNOSIS — M25631 Stiffness of right wrist, not elsewhere classified: Secondary | ICD-10-CM | POA: Diagnosis not present

## 2019-12-22 DIAGNOSIS — M25621 Stiffness of right elbow, not elsewhere classified: Secondary | ICD-10-CM | POA: Diagnosis not present

## 2019-12-22 DIAGNOSIS — S56211D Strain of other flexor muscle, fascia and tendon at forearm level, right arm, subsequent encounter: Secondary | ICD-10-CM | POA: Diagnosis not present

## 2019-12-22 DIAGNOSIS — M25511 Pain in right shoulder: Secondary | ICD-10-CM | POA: Diagnosis not present

## 2019-12-23 ENCOUNTER — Encounter: Payer: Self-pay | Admitting: Family Medicine

## 2019-12-23 ENCOUNTER — Ambulatory Visit (INDEPENDENT_AMBULATORY_CARE_PROVIDER_SITE_OTHER): Payer: Medicare Other | Admitting: Family Medicine

## 2019-12-23 ENCOUNTER — Other Ambulatory Visit: Payer: Self-pay

## 2019-12-23 VITALS — BP 118/72 | HR 78 | Temp 97.3°F | Resp 18 | Ht 62.0 in | Wt 208.0 lb

## 2019-12-23 DIAGNOSIS — G4733 Obstructive sleep apnea (adult) (pediatric): Secondary | ICD-10-CM | POA: Diagnosis not present

## 2019-12-23 DIAGNOSIS — F5101 Primary insomnia: Secondary | ICD-10-CM

## 2019-12-23 DIAGNOSIS — I110 Hypertensive heart disease with heart failure: Secondary | ICD-10-CM | POA: Diagnosis not present

## 2019-12-23 DIAGNOSIS — K5904 Chronic idiopathic constipation: Secondary | ICD-10-CM | POA: Diagnosis not present

## 2019-12-23 DIAGNOSIS — E782 Mixed hyperlipidemia: Secondary | ICD-10-CM

## 2019-12-23 DIAGNOSIS — R7301 Impaired fasting glucose: Secondary | ICD-10-CM

## 2019-12-23 DIAGNOSIS — K219 Gastro-esophageal reflux disease without esophagitis: Secondary | ICD-10-CM | POA: Diagnosis not present

## 2019-12-23 DIAGNOSIS — M5412 Radiculopathy, cervical region: Secondary | ICD-10-CM | POA: Diagnosis not present

## 2019-12-23 DIAGNOSIS — I5032 Chronic diastolic (congestive) heart failure: Secondary | ICD-10-CM

## 2019-12-23 DIAGNOSIS — I10 Essential (primary) hypertension: Secondary | ICD-10-CM | POA: Diagnosis not present

## 2019-12-23 DIAGNOSIS — F330 Major depressive disorder, recurrent, mild: Secondary | ICD-10-CM

## 2019-12-23 MED ORDER — AMITRIPTYLINE HCL 10 MG PO TABS: ORAL_TABLET | ORAL | 1 refills | Status: DC

## 2019-12-23 MED ORDER — PANTOPRAZOLE SODIUM 40 MG PO TBEC: DELAYED_RELEASE_TABLET | ORAL | 1 refills | Status: DC

## 2019-12-23 MED ORDER — METOPROLOL SUCCINATE ER 50 MG PO TB24: 75.0000 mg | ORAL_TABLET | Freq: Every day | ORAL | 1 refills | Status: DC

## 2019-12-23 MED ORDER — OLMESARTAN MEDOXOMIL-HCTZ 40-12.5 MG PO TABS: 1.0000 | ORAL_TABLET | Freq: Every day | ORAL | 1 refills | Status: DC

## 2019-12-23 MED ORDER — LINACLOTIDE 290 MCG PO CAPS: ORAL_CAPSULE | ORAL | 1 refills | Status: DC

## 2019-12-23 MED ORDER — CITALOPRAM HYDROBROMIDE 10 MG PO TABS: ORAL_TABLET | ORAL | 1 refills | Status: DC

## 2019-12-23 NOTE — Progress Notes (Signed)
Subjective:  Patient ID: Diana Hendrix, female    DOB: [birth year]  Age: 74 y.o. MRN: [id number]  Chief Complaint  Patient presents with  . Establish Care  . Hyperlipidemia  . Hypertension  . Gastroesophageal Reflux  . Constipation    HPI Cervical radiculopathy: Gabapentin has not helped.  She was seen Dr. Pricilla Holm (PCP) or one of her midlevel providers.  Recently she developed neck pain she saw Dr. Georgina Snell, who sent her for physical therapy: Which she is finishing up now.  Trial of prednisone which helped, but than worsened again.  She underwent dry needling which helped her tight muscles, but made them sore afterwards the symptoms would return. Xray of cervical spine. Pt is scheduled to return to see Dr. Georgina Snell later this week.  Dr. Georgina Snell did order a arthritis panel which was significant for a mildly low elevated ANA ratio.  She was referred to rheumatology, Dr. Benjamine Mola, who did not feel like she had rheumatoid arthritis or lupus. Patient has history of hypertension: She was referred to nephrology.  Her blood pressure has been well controlled on metoprolol XL 50 mg once daily, Benicar HCTZ 40/12.5 mg once daily.  Hyperlipidemia patient is currently on simvastatin 10 mg once daily.  Patient does not try to eat healthy.  Minimal exercise other than physical therapy.  Complaining of SOB/DOE: Pt saw Dr. Bettina Gavia the last year who performed an echocardiogram which showed diastolic dysfunction, nuclear stress test (which she was not able to finish) and ordered a CTA of the heart which apparently per the patient was unable to be approved.  Dr. Bettina Gavia started her on Lasix 20 mg 3 times a week.  She also has seen Dr. Halford Chessman.  Pulmonary function tests were normal.  She has been diagnosed with sleep apnea and wears her CPAP machine.  She has significant improvement in her sleep due to this.  Depression with anxiety: Patient is currently well controlled on citalopram 10 mg once daily and amitriptyline  10 mg 2 at night.  Patient has prediabetes.  Her last A1c was 6.1.  Patient is very concerned about her weight and would like guidance on eating healthy and exercising.  The patient had previously been on a program called "your image "here in [place] which works very well for her, however, this program has and is no longer available.  Current Outpatient Medications on File Prior to Visit  Medication Sig Dispense Refill  . Cholecalciferol (VITAMIN D3) 50 MCG (2000 UT) TABS Take 1 tablet by mouth daily.    . Coenzyme Q10 (COQ10 PO) Take 1 tablet by mouth every morning.    . furosemide (LASIX) 20 MG tablet Take 1 tablet (20 mg total) by mouth 3 (three) times a week. Please take on Monday, Wednesday, and Friday 45 tablet 3  . gabapentin (NEURONTIN) 100 MG capsule Take 1-3 capsules (100-300 mg total) by mouth at bedtime as needed (nerve pain). 30 capsule 3  . Polyethyl Glycol-Propyl Glycol (SYSTANE OP) Place 1 drop into both eyes 4 (four) times daily.    . polyethylene glycol powder (GLYCOLAX/MIRALAX) powder MIX 1 CAPFUL IN 8 OUNCES OF WATER AND DRINK ONCE A DAY 527 g 3  . Probiotic Product (PROBIOTIC DAILY PO) Take 1 tablet by mouth daily.    . simvastatin (ZOCOR) 10 MG tablet TAKE 1 TABLET(10 MG) BY MOUTH AT BEDTIME 90 tablet 1  . TURMERIC PO Take 500 mg by mouth daily.     No current facility-administered medications on  file prior to visit.   Past Medical History:  Diagnosis Date  . GERD (gastroesophageal reflux disease)   . History of colon polyps   . Hyperlipidemia   . Hypertension   . Macular edema, cystoid   . OSA (obstructive sleep apnea) 09/17/2012  . Osteopenia    Past Surgical History:  Procedure Laterality Date  . CESAREAN SECTION    . COLONOSCOPY  10/29/2013   Colonic polyp status post polypectomy. Mild sigmoid diverticulosis. Small internal hemorrhoids  . FOOT NEUROMA SURGERY     s/p surgery in both feet  . FOOT SURGERY Bilateral    Per patient  . MENISCUS REPAIR Left  09/13/2016  . miniscus repair right  Right 2019  . SHOULDER SURGERY Left    arthroscopy.   . TUBAL LIGATION      Family History  Problem Relation Age of Onset  . Hypertension Mother   . Atrial fibrillation Mother   . CAD Mother   . Congestive Heart Failure Mother   . CVA Father   . Hypertension Brother   . Hypertension Son   . Colon cancer Neg Hx   . Esophageal cancer Neg Hx    Social History   Socioeconomic History  . Marital status: Married    Spouse name: Not on file  . Number of children: 2  . Years of education: Not on file  . Highest education level: Not on file  Occupational History  . Not on file  Tobacco Use  . Smoking status: Never Smoker  . Smokeless tobacco: Never Used  Vaping Use  . Vaping Use: Never used  Substance and Sexual Activity  . Alcohol use: No  . Drug use: No  . Sexual activity: Not Currently  Other Topics Concern  . Not on file  Social History Narrative  . Not on file   Social Determinants of Health   Financial Resource Strain:   . Difficulty of Paying Living Expenses: Not on file  Food Insecurity:   . Worried About Charity fundraiser in the Last Year: Not on file  . Ran Out of Food in the Last Year: Not on file  Transportation Needs:   . Lack of Transportation (Medical): Not on file  . Lack of Transportation (Non-Medical): Not on file  Physical Activity:   . Days of Exercise per Week: Not on file  . Minutes of Exercise per Session: Not on file  Stress:   . Feeling of Stress : Not on file  Social Connections:   . Frequency of Communication with Friends and Family: Not on file  . Frequency of Social Gatherings with Friends and Family: Not on file  . Attends Religious Services: Not on file  . Active Member of Clubs or Organizations: Not on file  . Attends Archivist Meetings: Not on file  . Marital Status: Not on file    Review of Systems  Constitutional: Positive for fatigue. Negative for chills and fever.  HENT:  Negative for congestion, rhinorrhea and sore throat.   Respiratory: Negative for cough and shortness of breath.   Cardiovascular: Negative for chest pain.  Gastrointestinal: Positive for constipation. Negative for abdominal pain, diarrhea, nausea and vomiting.  Genitourinary: Negative for dysuria and urgency.  Musculoskeletal: Positive for arthralgias (left arm pain ). Negative for back pain and myalgias.  Neurological: Negative for dizziness, weakness, light-headedness and headaches.  Psychiatric/Behavioral: Negative for dysphoric mood. The patient is not nervous/anxious.      Objective:  BP 118/72  Pulse 78   Temp (!) 97.3 F (36.3 C)   Resp 18   Ht 5\' 2"  (1.575 m)   Wt 208 lb (94.3 kg)   BMI 38.04 kg/m   BP/Weight 12/23/2019 12/10/2019 27/07/8240  Systolic BP 353 94 614  Diastolic BP 72 55 78  Wt. (Lbs) 208 207.4 208  BMI 38.04 37.33 37.44    Physical Exam Vitals reviewed.  Constitutional:      General: She is not in acute distress.    Appearance: Normal appearance. She is obese.  HENT:     Right Ear: Tympanic membrane and ear canal normal.     Left Ear: Tympanic membrane and ear canal normal.     Nose: Nose normal. No congestion or rhinorrhea.  Eyes:     Conjunctiva/sclera: Conjunctivae normal.  Neck:     Thyroid: No thyroid mass.  Cardiovascular:     Rate and Rhythm: Normal rate and regular rhythm.     Pulses: Normal pulses.     Heart sounds: No murmur heard.   Pulmonary:     Effort: Pulmonary effort is normal.     Breath sounds: Normal breath sounds.  Abdominal:     General: Bowel sounds are normal.     Palpations: Abdomen is soft. There is no mass.     Tenderness: There is no abdominal tenderness.  Musculoskeletal:        General: Normal range of motion.  Lymphadenopathy:     Cervical: No cervical adenopathy.  Skin:    General: Skin is warm and dry.  Neurological:     Mental Status: She is alert and oriented to person, place, and time.     Cranial  Nerves: No cranial nerve deficit.  Psychiatric:        Mood and Affect: Mood normal.        Behavior: Behavior normal.     Diabetic Foot Exam - Simple   No data filed       Lab Results  Component Value Date   WBC 6.1 11/25/2019   HGB 13.4 11/25/2019   HCT 39.1 11/25/2019   PLT 282.0 11/25/2019   GLUCOSE 83 11/25/2019   CHOL 182 04/24/2019   TRIG 103.0 04/24/2019   HDL 50.50 04/24/2019   LDLCALC 111 (H) 04/24/2019   ALT 21 11/25/2019   AST 24 11/25/2019   NA 130 (L) 11/25/2019   K 4.2 11/25/2019   CL 96 11/25/2019   CREATININE 1.00 11/25/2019   BUN 12 11/25/2019   CO2 27 11/25/2019   TSH 1.20 09/17/2013   HGBA1C 6.0 04/24/2019      Assessment & Plan:   1. Primary hypertension Well controlled.  No changes to medicines.  Continue to work on eating a healthy diet and exercise.  Labs drawn today.  - CBC with Differential/Platelet - Comprehensive metabolic panel - TSH  2. OSA (obstructive sleep apnea) Continue to use CPAP.  Is compliant and benefits.  3. Impaired fasting blood sugar Control: Await A1c. Medicines: Sater Metformin or a GLP-1 to help with sugar control as well as weight. Continue to work on eating a healthy diet and exercise.  Labs drawn today.   - Hemoglobin A1c  4. Gastroesophageal reflux disease without esophagitis Continue current medications.  Well-controlled  5. Mixed hyperlipidemia Well controlled.  No medication changes recommended. Continue healthy diet and exercise.  - Lipid panel   6. Cervical radiculopathy Patient to follow-up with Dr. Georgina Snell.  I would recommend either a cervical MRI or nerve  conduction study of her upper extremities.  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  Well-controlled. - citalopram (CELEXA) 10 MG tablet; TAKE 1 TABLET(10 MG) BY MOUTH DAILY  Dispense: 90 tablet; Refill: 1  8. Primary insomnia Well-controlled.  Previously tried to discontinue but did not tolerate.  Sleeps  much better with the medication. - amitriptyline (ELAVIL) 10 MG tablet; TAKE 2 TABLETS(20 MG) BY MOUTH AT BEDTIME  Dispense: 180 tablet; Refill: 1  9. Chronic idiopathic constipation Patient takes this daily and does very well with it. - linaclotide (LINZESS) 290 MCG CAPS capsule; TAKE 1 CAPSULE(290 MCG) BY MOUTH DAILY BEFORE BREAKFAST  Dispense: 90 capsule; Refill: 1   Meds ordered this encounter  Medications  . amitriptyline (ELAVIL) 10 MG tablet    Sig: TAKE 2 TABLETS(20 MG) BY MOUTH AT BEDTIME    Dispense:  180 tablet    Refill:  1  . citalopram (CELEXA) 10 MG tablet    Sig: TAKE 1 TABLET(10 MG) BY MOUTH DAILY    Dispense:  90 tablet    Refill:  1  . pantoprazole (PROTONIX) 40 MG tablet    Sig: One daily    Dispense:  90 tablet    Refill:  1  . metoprolol succinate (TOPROL-XL) 50 MG 24 hr tablet    Sig: Take 1.5 tablets (75 mg total) by mouth daily. Take with or immediately following a meal.    Dispense:  90 tablet    Refill:  1  . linaclotide (LINZESS) 290 MCG CAPS capsule    Sig: TAKE 1 CAPSULE(290 MCG) BY MOUTH DAILY BEFORE BREAKFAST    Dispense:  90 capsule    Refill:  1  . olmesartan-hydrochlorothiazide (BENICAR HCT) 40-12.5 MG tablet    Sig: Take 1 tablet by mouth daily.    Dispense:  90 tablet    Refill:  1    Orders Placed This Encounter  Procedures  . CBC with Differential/Platelet  . Comprehensive metabolic panel  . TSH  . Lipid panel  . Hemoglobin A1c    I spent 30 minutes dedicated to the care of this patient on the date of this encounter to include face-to-face time with the patient, as well as: Full review of past medical history as well as numerous specialist visits reviewed.  Follow-up: Return for follow-up this month for an annual wellness visit with one of our providers.  An After Visit Summary was printed and given to the patient.  Rochel Brome, MD [facility] [phone]

## 2019-12-23 NOTE — Patient Instructions (Signed)
Prediabetes Eating Plan Prediabetes is a condition that causes blood sugar (glucose) levels to be higher than normal. This increases the risk for developing diabetes. In order to prevent diabetes from developing, your health care provider may recommend a diet and other lifestyle changes to help you:  Control your blood glucose levels.  Improve your cholesterol levels.  Manage your blood pressure. Your health care provider may recommend working with a diet and nutrition specialist (dietitian) to make a meal plan that is best for you. What are tips for following this plan? Lifestyle  Set weight loss goals with the help of your health care team. It is recommended that most people with prediabetes lose 7% of their current body weight.  Exercise for at least 30 minutes at least 5 days a week.  Attend a support group or seek ongoing support from a mental health counselor.  Take over-the-counter and prescription medicines only as told by your health care provider. Reading food labels  Read food labels to check the amount of fat, salt (sodium), and sugar in prepackaged foods. Avoid foods that have: ? Saturated fats. ? Trans fats. ? Added sugars.  Avoid foods that have more than 300 milligrams (mg) of sodium per serving. Limit your daily sodium intake to less than 2,300 mg each day. Shopping  Avoid buying pre-made and processed foods. Cooking  Cook with olive oil. Do not use butter, lard, or ghee.  Bake, broil, grill, or boil foods. Avoid frying. Meal planning   Work with your dietitian to develop an eating plan that is right for you. This may include: ? Tracking how many calories you take in. Use a food diary, notebook, or mobile application to track what you eat at each meal. ? Using the glycemic index (GI) to plan your meals. The index tells you how quickly a food will raise your blood glucose. Choose low-GI foods. These foods take a longer time to raise blood glucose.  Consider  following a Mediterranean diet. This diet includes: ? Several servings each day of fresh fruits and vegetables. ? Eating fish at least twice a week. ? Several servings each day of whole grains, beans, nuts, and seeds. ? Using olive oil instead of other fats. ? Moderate alcohol consumption. ? Eating small amounts of red meat and whole-fat dairy.  If you have high blood pressure, you may need to limit your sodium intake or follow a diet such as the DASH eating plan. DASH is an eating plan that aims to lower high blood pressure. What foods are recommended? The items listed below may not be a complete list. Talk with your dietitian about what dietary choices are best for you. Grains Whole grains, such as whole-wheat or whole-grain breads, crackers, cereals, and pasta. Unsweetened oatmeal. Bulgur. Barley. Quinoa. Brown rice. Corn or whole-wheat flour tortillas or taco shells. Vegetables Lettuce. Spinach. Peas. Beets. Cauliflower. Cabbage. Broccoli. Carrots. Tomatoes. Squash. Eggplant. Herbs. Peppers. Onions. Cucumbers. Brussels sprouts. Fruits Berries. Bananas. Apples. Oranges. Grapes. Papaya. Mango. Pomegranate. Kiwi. Grapefruit. Cherries. Meats and other protein foods Seafood. Poultry without skin. Lean cuts of pork and beef. Tofu. Eggs. Nuts. Beans. Dairy Low-fat or fat-free dairy products, such as yogurt, cottage cheese, and cheese. Beverages Water. Tea. Coffee. Sugar-free or diet soda. Seltzer water. Lowfat or no-fat milk. Milk alternatives, such as soy or almond milk. Fats and oils Olive oil. Canola oil. Sunflower oil. Grapeseed oil. Avocado. Walnuts. Sweets and desserts Sugar-free or low-fat pudding. Sugar-free or low-fat ice cream and other frozen treats.   Seasoning and other foods Herbs. Sodium-free spices. Mustard. Relish. Low-fat, low-sugar ketchup. Low-fat, low-sugar barbecue sauce. Low-fat or fat-free mayonnaise. What foods are not recommended? The items listed below may not be a  complete list. Talk with your dietitian about what dietary choices are best for you. Grains Refined white flour and flour products, such as bread, pasta, snack foods, and cereals. Vegetables Canned vegetables. Frozen vegetables with butter or cream sauce. Fruits Fruits canned with syrup. Meats and other protein foods Fatty cuts of meat. Poultry with skin. Breaded or fried meat. Processed meats. Dairy Full-fat yogurt, cheese, or milk. Beverages Sweetened drinks, such as sweet iced tea and soda. Fats and oils Butter. Lard. Ghee. Sweets and desserts Baked goods, such as cake, cupcakes, pastries, cookies, and cheesecake. Seasoning and other foods Spice mixes with added salt. Ketchup. Barbecue sauce. Mayonnaise. Summary  To prevent diabetes from developing, you may need to make diet and other lifestyle changes to help control blood sugar, improve cholesterol levels, and manage your blood pressure.  Set weight loss goals with the help of your health care team. It is recommended that most people with prediabetes lose 7 percent of their current body weight.  Consider following a Mediterranean diet that includes plenty of fresh fruits and vegetables, whole grains, beans, nuts, seeds, fish, lean meat, low-fat dairy, and healthy oils. This information is not intended to replace advice given to you by your health care provider. Make sure you discuss any questions you have with your health care provider. Document Revised: 04/26/2018 Document Reviewed: 03/08/2016 Elsevier Patient Education  [place] DASH stands for "Dietary Approaches to Stop Hypertension." The DASH eating plan is a healthy eating plan that has been shown to reduce high blood pressure (hypertension). It may also reduce your risk for type 2 diabetes, heart disease, and stroke. The DASH eating plan may also help with weight loss. What are tips for following this plan?  General guidelines  Avoid eating  more than 2,300 mg (milligrams) of salt (sodium) a day. If you have hypertension, you may need to reduce your sodium intake to 1,500 mg a day.  Limit alcohol intake to no more than 1 drink a day for nonpregnant women and 2 drinks a day for men. One drink equals 12 oz of beer, 5 oz of wine, or 1 oz of hard liquor.  Work with your health care provider to maintain a healthy body weight or to lose weight. Ask what an ideal weight is for you.  Get at least 30 minutes of exercise that causes your heart to beat faster (aerobic exercise) most days of the week. Activities may include walking, swimming, or biking.  Work with your health care provider or diet and nutrition specialist (dietitian) to adjust your eating plan to your individual calorie needs. Reading food labels   Check food labels for the amount of sodium per serving. Choose foods with less than 5 percent of the Daily Value of sodium. Generally, foods with less than 300 mg of sodium per serving fit into this eating plan.  To find whole grains, look for the word "whole" as the first word in the ingredient list. Shopping  Buy products labeled as "low-sodium" or "no salt added."  Buy fresh foods. Avoid canned foods and premade or frozen meals. Cooking  Avoid adding salt when cooking. Use salt-free seasonings or herbs instead of table salt or sea salt. Check with your health care provider or pharmacist before using salt substitutes.  Do not fry foods. Cook foods using healthy methods such as baking, boiling, grilling, and broiling instead.  Cook with heart-healthy oils, such as olive, canola, soybean, or sunflower oil. Meal planning  Eat a balanced diet that includes: ? 5 or more servings of fruits and vegetables each day. At each meal, try to fill half of your plate with fruits and vegetables. ? Up to 6-8 servings of whole grains each day. ? Less than 6 oz of lean meat, poultry, or fish each day. A 3-oz serving of meat is about the  same size as a deck of cards. One egg equals 1 oz. ? 2 servings of low-fat dairy each day. ? A serving of nuts, seeds, or beans 5 times each week. ? Heart-healthy fats. Healthy fats called Omega-3 fatty acids are found in foods such as flaxseeds and coldwater fish, like sardines, salmon, and mackerel.  Limit how much you eat of the following: ? Canned or prepackaged foods. ? Food that is high in trans fat, such as fried foods. ? Food that is high in saturated fat, such as fatty meat. ? Sweets, desserts, sugary drinks, and other foods with added sugar. ? Full-fat dairy products.  Do not salt foods before eating.  Try to eat at least 2 vegetarian meals each week.  Eat more home-cooked food and less restaurant, buffet, and fast food.  When eating at a restaurant, ask that your food be prepared with less salt or no salt, if possible. What foods are recommended? The items listed may not be a complete list. Talk with your dietitian about what dietary choices are best for you. Grains Whole-grain or whole-wheat bread. Whole-grain or whole-wheat pasta. Brown rice. [place]. Bulgur. Whole-grain and low-sodium cereals. Pita bread. Low-fat, low-sodium crackers. Whole-wheat flour tortillas. Vegetables Fresh or frozen vegetables (raw, steamed, roasted, or grilled). Low-sodium or reduced-sodium tomato and vegetable juice. Low-sodium or reduced-sodium tomato sauce and tomato paste. Low-sodium or reduced-sodium canned vegetables. Fruits All fresh, dried, or frozen fruit. Canned fruit in natural juice (without added sugar). Meat and other protein foods Skinless chicken or [place]. Ground chicken or [place]. Pork with fat trimmed off. Fish and seafood. Egg whites. Dried beans, peas, or lentils. Unsalted nuts, nut butters, and seeds. Unsalted canned beans. Lean cuts of beef with fat trimmed off. Low-sodium, lean deli meat. Dairy Low-fat (1%) or fat-free (skim) milk. Fat-free, low-fat, or reduced-fat  cheeses. Nonfat, low-sodium ricotta or cottage cheese. Low-fat or nonfat yogurt. Low-fat, low-sodium cheese. Fats and oils Soft margarine without trans fats. Vegetable oil. Low-fat, reduced-fat, or light mayonnaise and salad dressings (reduced-sodium). Canola, safflower, olive, soybean, and sunflower oils. Avocado. Seasoning and other foods Herbs. Spices. Seasoning mixes without salt. Unsalted popcorn and pretzels. Fat-free sweets. What foods are not recommended? The items listed may not be a complete list. Talk with your dietitian about what dietary choices are best for you. Grains Baked goods made with fat, such as croissants, muffins, or some breads. Dry pasta or rice meal packs. Vegetables Creamed or fried vegetables. Vegetables in a cheese sauce. Regular canned vegetables (not low-sodium or reduced-sodium). Regular canned tomato sauce and paste (not low-sodium or reduced-sodium). Regular tomato and vegetable juice (not low-sodium or reduced-sodium). Angie Fava. Olives. Fruits Canned fruit in a light or heavy syrup. Fried fruit. Fruit in cream or butter sauce. Meat and other protein foods Fatty cuts of meat. Ribs. Fried meat. Berniece Salines. Sausage. Bologna and other processed lunch meats. Salami. Fatback. Hotdogs. Bratwurst. Salted nuts and seeds. Canned beans with  added salt. Canned or smoked fish. Whole eggs or egg yolks. Chicken or [place] with skin. Dairy Whole or 2% milk, cream, and half-and-half. Whole or full-fat cream cheese. Whole-fat or sweetened yogurt. Full-fat cheese. Nondairy creamers. Whipped toppings. Processed cheese and cheese spreads. Fats and oils Butter. Stick margarine. Lard. Shortening. Ghee. Bacon fat. Tropical oils, such as coconut, palm kernel, or palm oil. Seasoning and other foods Salted popcorn and pretzels. Onion salt, garlic salt, seasoned salt, table salt, and sea salt. Worcestershire sauce. Tartar sauce. Barbecue sauce. Teriyaki sauce. Soy sauce, including  reduced-sodium. Steak sauce. Canned and packaged gravies. Fish sauce. Oyster sauce. Cocktail sauce. Horseradish that you find on the shelf. Ketchup. Mustard. Meat flavorings and tenderizers. Bouillon cubes. Hot sauce and Tabasco sauce. Premade or packaged marinades. Premade or packaged taco seasonings. Relishes. Regular salad dressings. Where to find more information:  National Heart, Lung, and [place]: https://wilson-eaton.com/  American Heart Association: www.heart.org Summary  The DASH eating plan is a healthy eating plan that has been shown to reduce high blood pressure (hypertension). It may also reduce your risk for type 2 diabetes, heart disease, and stroke.  With the DASH eating plan, you should limit salt (sodium) intake to 2,300 mg a day. If you have hypertension, you may need to reduce your sodium intake to 1,500 mg a day.  When on the DASH eating plan, aim to eat more fresh fruits and vegetables, whole grains, lean proteins, low-fat dairy, and heart-healthy fats.  Work with your health care provider or diet and nutrition specialist (dietitian) to adjust your eating plan to your individual calorie needs. This information is not intended to replace advice given to you by your health care provider. Make sure you discuss any questions you have with your health care provider. Document Revised: 12/15/2016 Document Reviewed: 12/27/2015 Elsevier Patient Education  2020 [employer].

## 2019-12-24 DIAGNOSIS — M25531 Pain in right wrist: Secondary | ICD-10-CM | POA: Diagnosis not present

## 2019-12-24 DIAGNOSIS — S56211D Strain of other flexor muscle, fascia and tendon at forearm level, right arm, subsequent encounter: Secondary | ICD-10-CM | POA: Diagnosis not present

## 2019-12-24 DIAGNOSIS — M25621 Stiffness of right elbow, not elsewhere classified: Secondary | ICD-10-CM | POA: Diagnosis not present

## 2019-12-24 DIAGNOSIS — M25631 Stiffness of right wrist, not elsewhere classified: Secondary | ICD-10-CM | POA: Diagnosis not present

## 2019-12-24 DIAGNOSIS — M25511 Pain in right shoulder: Secondary | ICD-10-CM | POA: Diagnosis not present

## 2019-12-24 DIAGNOSIS — M25521 Pain in right elbow: Secondary | ICD-10-CM | POA: Diagnosis not present

## 2019-12-24 LAB — CBC WITH DIFFERENTIAL/PLATELET
Basophils Absolute: 0 10*3/uL (ref 0.0–0.2)
Basos: 1 %
EOS (ABSOLUTE): 0.4 10*3/uL (ref 0.0–0.4)
Eos: 5 %
Hematocrit: 41 % (ref 34.0–46.6)
Hemoglobin: 13.4 g/dL (ref 11.1–15.9)
Immature Grans (Abs): 0 10*3/uL (ref 0.0–0.1)
Immature Granulocytes: 0 %
Lymphocytes Absolute: 2.1 10*3/uL (ref 0.7–3.1)
Lymphs: 31 %
MCH: 29.6 pg (ref 26.6–33.0)
MCHC: 32.7 g/dL (ref 31.5–35.7)
MCV: 91 fL (ref 79–97)
Monocytes Absolute: 0.7 10*3/uL (ref 0.1–0.9)
Monocytes: 10 %
Neutrophils Absolute: 3.6 10*3/uL (ref 1.4–7.0)
Neutrophils: 53 %
Platelets: 299 10*3/uL (ref 150–450)
RBC: 4.52 x10E6/uL (ref 3.77–5.28)
RDW: 12.8 % (ref 11.7–15.4)
WBC: 6.8 10*3/uL (ref 3.4–10.8)

## 2019-12-24 LAB — COMPREHENSIVE METABOLIC PANEL
ALT: 15 IU/L (ref 0–32)
AST: 17 IU/L (ref 0–40)
Albumin/Globulin Ratio: 1.5 (ref 1.2–2.2)
Albumin: 4.3 g/dL (ref 3.7–4.7)
Alkaline Phosphatase: 90 IU/L (ref 44–121)
BUN/Creatinine Ratio: 16 (ref 12–28)
BUN: 16 mg/dL (ref 8–27)
Bilirubin Total: 0.5 mg/dL (ref 0.0–1.2)
CO2: 24 mmol/L (ref 20–29)
Calcium: 9.7 mg/dL (ref 8.7–10.3)
Chloride: 96 mmol/L (ref 96–106)
Creatinine, Ser: 0.99 mg/dL (ref 0.57–1.00)
GFR calc Af Amer: 65 mL/min/{1.73_m2} (ref >59)
GFR calc non Af Amer: 57 mL/min/{1.73_m2} — ABNORMAL LOW (ref >59)
Globulin, Total: 2.9 g/dL (ref 1.5–4.5)
Glucose: 95 mg/dL (ref 65–99)
Potassium: 4.6 mmol/L (ref 3.5–5.2)
Sodium: 134 mmol/L (ref 134–144)
Total Protein: 7.2 g/dL (ref 6.0–8.5)

## 2019-12-24 LAB — LIPID PANEL
Chol/HDL Ratio: 4.4 ratio (ref 0.0–4.4)
Cholesterol, Total: 198 mg/dL (ref 100–199)
HDL: 45 mg/dL (ref >39)
LDL Chol Calc (NIH): 122 mg/dL — ABNORMAL HIGH (ref 0–99)
Triglycerides: 173 mg/dL — ABNORMAL HIGH (ref 0–149)
VLDL Cholesterol Cal: 31 mg/dL (ref 5–40)

## 2019-12-24 LAB — TSH: TSH: 1.64 u[IU]/mL (ref 0.450–4.500)

## 2019-12-24 LAB — HEMOGLOBIN A1C
Est. average glucose Bld gHb Est-mCnc: 126 mg/dL
Hgb A1c MFr Bld: 6 % — ABNORMAL HIGH (ref 4.8–5.6)

## 2019-12-24 LAB — CARDIOVASCULAR RISK ASSESSMENT

## 2019-12-24 NOTE — Progress Notes (Unsigned)
I, Wendy Poet, LAT, ATC, am serving as scribe for Dr. [name].  Diana Hendrix is a 74 y.o. female who presents to [place] at [facility] today for f/u of R forearm pain.  She was last seen by Dr. Georgina Snell on 11/25/19 w/ worsening pain that was radiating into her R upper arm and was prescribed a short course of prednisone and referred to PT.   She also had labwork which showed an elevated sedimentation rate and + ANA and was referred to rhematology based on these results.  Since her last visit w/ Dr. Georgina Snell, pt reports no improvement and might actually be worse. Pt was dc from PT yesterday. Pt reports dry needling helped temporarily, but tightness would return within a few days. Pt locates pn along radial aspect of forearm and c/o aching into R hand and up into upper arm.  Diagnostic imaging: R wrist and c-spine XR- 11/25/19  Pertinent review of systems: No fevers or chills  Relevant historical information: Hypertension, sleep apnea   Exam:  BP 110/70 (BP Location: Left Arm, Patient Position: Sitting, Cuff Size: Normal)   Pulse 66   Ht '5\' 2"'  (1.575 m)   Wt 209 lb (94.8 kg)   SpO2 96%   BMI 38.23 kg/m  General: Well Developed, well nourished, and in no acute distress.   MSK: C-spine normal. Nontender midline. Normal cervical motion. Normal Spurling's test. Approximately strength reflexes and sensation are equal normal throughout.  Right shoulder normal-appearing nontender normal shoulder motion and strength.  Right elbow normal-appearing nontender normal elbow motion and strength. Right forearm normal-appearing Tender palpation right lateral forearm near lateral epicondyle. Pain with resisted wrist extension and flexion.  Right wrist normal-appearing nontender normal motion and strength. Pulses cap refill and sensation are intact distally.    Lab and Radiology Results Results for orders placed or performed in visit on 12/23/19 (from the past 72  hour(s))  CBC with Differential/Platelet     Status: None   Collection Time: 12/23/19 11:23 AM  Result Value Ref Range   WBC 6.8 3.4 - 10.8 x10E3/uL   RBC 4.52 3.77 - 5.28 x10E6/uL   Hemoglobin 13.4 11.1 - 15.9 g/dL   Hematocrit 41.0 34.0 - 46.6 %   MCV 91 79 - 97 fL   MCH 29.6 26.6 - 33.0 pg   MCHC 32.7 31.5 - 35.7 g/dL   RDW 12.8 11.7 - 15.4 %   Platelets 299 150 - 450 x10E3/uL   Neutrophils 53 Not Estab. %   Lymphs 31 Not Estab. %   Monocytes 10 Not Estab. %   Eos 5 Not Estab. %   Basos 1 Not Estab. %   Neutrophils Absolute 3.6 1.4 - 7.0 x10E3/uL   Lymphocytes Absolute 2.1 0.7 - 3.1 x10E3/uL   Monocytes Absolute 0.7 0.1 - 0.9 x10E3/uL   EOS (ABSOLUTE) 0.4 0.0 - 0.4 x10E3/uL   Basophils Absolute 0.0 0.0 - 0.2 x10E3/uL   Immature Granulocytes 0 Not Estab. %   Immature Grans (Abs) 0.0 0.0 - 0.1 x10E3/uL  Comprehensive metabolic panel     Status: Abnormal   Collection Time: 12/23/19 11:23 AM  Result Value Ref Range   Glucose 95 65 - 99 mg/dL   BUN 16 8 - 27 mg/dL   Creatinine, Ser 0.99 0.57 - 1.00 mg/dL   GFR calc non Af Amer 57 (L) >59 mL/min/1.73   GFR calc Af Amer 65 >59 mL/min/1.73    Comment: **In accordance with recommendations from the  NKF-ASN Task force,**   [facility] is in the process of updating its eGFR calculation to the   2021 CKD-EPI creatinine equation that estimates kidney function   without a race variable.    BUN/Creatinine Ratio 16 12 - 28   Sodium 134 134 - 144 mmol/L   Potassium 4.6 3.5 - 5.2 mmol/L   Chloride 96 96 - 106 mmol/L   CO2 24 20 - 29 mmol/L   Calcium 9.7 8.7 - 10.3 mg/dL   Total Protein 7.2 6.0 - 8.5 g/dL   Albumin 4.3 3.7 - 4.7 g/dL   Globulin, Total 2.9 1.5 - 4.5 g/dL   Albumin/Globulin Ratio 1.5 1.2 - 2.2   Bilirubin Total 0.5 0.0 - 1.2 mg/dL   Alkaline Phosphatase 90 44 - 121 IU/L    Comment:               **Please note reference interval change**   AST 17 0 - 40 IU/L   ALT 15 0 - 32 IU/L  TSH     Status: None   Collection Time:  12/23/19 11:23 AM  Result Value Ref Range   TSH 1.640 0.450 - 4.500 uIU/mL  Lipid panel     Status: Abnormal   Collection Time: 12/23/19 11:23 AM  Result Value Ref Range   Cholesterol, Total 198 100 - 199 mg/dL   Triglycerides 173 (H) 0 - 149 mg/dL   HDL 45 >39 mg/dL   VLDL Cholesterol Cal 31 5 - 40 mg/dL   LDL Chol Calc (NIH) 122 (H) 0 - 99 mg/dL   Chol/HDL Ratio 4.4 0.0 - 4.4 ratio    Comment:                                   T. Chol/HDL Ratio                                             Men  Women                               1/2 Avg.Risk  3.4    3.3                                   Avg.Risk  5.0    4.4                                2X Avg.Risk  9.6    7.1                                3X Avg.Risk 23.4   11.0   Hemoglobin A1c     Status: Abnormal   Collection Time: 12/23/19 11:23 AM  Result Value Ref Range   Hgb A1c MFr Bld 6.0 (H) 4.8 - 5.6 %    Comment:          Prediabetes: 5.7 - 6.4          Diabetes: >6.4          Glycemic control for adults with diabetes: <7.0  Est. average glucose Bld gHb Est-mCnc 126 mg/dL  Cardiovascular Risk Assessment     Status: None   Collection Time: 12/23/19 11:23 AM  Result Value Ref Range   Interpretation Note     Comment: Supplemental report is available.   No results found.   DG Cervical Spine Complete  Result Date: 11/25/2019 CLINICAL DATA:  Cervicalgia EXAM: CERVICAL SPINE - COMPLETE 4+ VIEW COMPARISON:  None. FINDINGS: Frontal, lateral, open-mouth odontoid, and bilateral oblique views were obtained. There is no fracture or spondylolisthesis. Prevertebral soft tissues and predental space regions are normal. There is slight disc space narrowing at C6-7 and C7-T1. Other disc spaces appear unremarkable. Prominent osteophyte noted anteriorly and inferiorly at C4. There is no appreciable exit foraminal narrowing on the oblique views. Lung apices are clear. IMPRESSION: Areas of relatively mild osteoarthritic change. No fracture or  spondylolisthesis. Electronically Signed   By: Lowella Grip III M.D.   On: 11/25/2019 20:31   DG Wrist Complete Right  Result Date: 11/25/2019 CLINICAL DATA:  Pain EXAM: RIGHT WRIST - COMPLETE 3+ VIEW COMPARISON:  None. FINDINGS: Frontal, oblique, lateral, and ulnar deviation scaphoid images were obtained. No fracture or dislocation. Joint spaces appear normal. No erosive change. IMPRESSION: No fracture or dislocation.  No evident arthropathy. Electronically Signed   By: Lowella Grip III M.D.   On: 11/25/2019 20:29   [place] LIMITED JOINT SPACE STRUCTURES UP RIGHT(NO LINKED CHARGES)  Result Date: 12/08/2019 Diagnostic Limited MS[facility] Ultrasound of: Right elbow and wrist Right elbow: Lateral epicondyle visualized.  Intact extensor tendon insertion.  Slight hyperechoic change distal tip of lateral epicondyle consistent with mild lateral epicondylitis. Anterior elbow normal-appearing Dorsal wrist: Normal-appearing dorsal wrist structures.  No significant tenosynovitis visible. Impression: Possible lateral epicondylitis otherwise wrist and elbow normal-appearing  I, [name], personally (independently) visualized and performed the interpretation of the images attached in this note.   Assessment and Plan: 74 y.o. female with right arm pain.  Pain occurs from the right anterior shoulder and radiates all the way down to the right wrist.  However the dominant pain is felt at the right lateral forearm near the lateral epicondyle.  Wrist motion seems to be the dominant factor to cause the arm pain however her symptoms are not typically worsened much by motion during office visits.  Differential at this time is somewhat broad including potential cervical radiculopathy, brachial plexus pathology, or more local tendinopathy.  She has had an excellent trial of physical therapy, and evaluation with rheumatology.  At this point is still a bit unclear what her diagnosis is.  Plan for MRI C-spine to evaluate for  cervical radiculopathy and for MRI forearm to evaluate for tendinopathy.  Additionally plan for nerve conduction study.  This should help evaluate for potential more peripheral nerve injury.  Recheck following MRIs.   PDMP not reviewed this encounter. Orders Placed This Encounter  Procedures  . MR FOREARM RIGHT WO CONTRAST    Standing Status:   Future    Standing Expiration Date:   12/24/2020    Order Specific Question:   What is the patient's sedation requirement?    Answer:   No Sedation    Order Specific Question:   Does the patient have a pacemaker or implanted devices?    Answer:   No    Order Specific Question:   Preferred imaging location?    Answer:   [facility] (table limit-550lbs)  . MR CERVICAL SPINE WO CONTRAST    Standing Status:  Future    Standing Expiration Date:   12/24/2020    Order Specific Question:   What is the patient's sedation requirement?    Answer:   No Sedation    Order Specific Question:   Does the patient have a pacemaker or implanted devices?    Answer:   No    Order Specific Question:   Preferred imaging location?    Answer:   [facility] (table limit-550lbs)  . Ambulatory referral to Neurology    Referral Priority:   Routine    Referral Type:   Consultation    Referral Reason:   Specialty Services Required    Requested Specialty:   Neurology    Number of Visits Requested:   1  . NCV with EMG(electromyography)    Standing Status:   Future    Standing Expiration Date:   12/24/2020    Order Specific Question:   Where should this test be performed?    Answer:   [facility]   No orders of the defined types were placed in this encounter.    Discussed warning signs or symptoms. Please see discharge instructions. Patient expresses understanding.   The above documentation has been reviewed and is accurate and complete [name], M.D.

## 2019-12-25 ENCOUNTER — Ambulatory Visit (INDEPENDENT_AMBULATORY_CARE_PROVIDER_SITE_OTHER): Payer: Medicare Other | Admitting: Family Medicine

## 2019-12-25 ENCOUNTER — Other Ambulatory Visit: Payer: Self-pay

## 2019-12-25 ENCOUNTER — Encounter: Payer: Self-pay | Admitting: Neurology

## 2019-12-25 VITALS — BP 110/70 | HR 66 | Ht 62.0 in | Wt 209.0 lb

## 2019-12-25 DIAGNOSIS — R768 Other specified abnormal immunological findings in serum: Secondary | ICD-10-CM | POA: Diagnosis not present

## 2019-12-25 DIAGNOSIS — M79631 Pain in right forearm: Secondary | ICD-10-CM

## 2019-12-25 DIAGNOSIS — M79601 Pain in right arm: Secondary | ICD-10-CM

## 2019-12-25 NOTE — Patient Instructions (Addendum)
Thank you for coming in today.  Plan for MRIs  Recheck after the MRIs  Keep me updated.   Plan for referral to neurogy for the nerve test. We can cancel that if we find the answer sooner.

## 2019-12-26 ENCOUNTER — Other Ambulatory Visit: Payer: Self-pay

## 2019-12-26 ENCOUNTER — Encounter: Payer: Self-pay | Admitting: Family Medicine

## 2019-12-26 MED ORDER — SIMVASTATIN 20 MG PO TABS
ORAL_TABLET | ORAL | 0 refills | Status: DC
Start: 2019-12-26 — End: 2020-04-19

## 2019-12-30 ENCOUNTER — Encounter: Payer: Self-pay | Admitting: Internal Medicine

## 2019-12-31 ENCOUNTER — Encounter: Payer: Self-pay | Admitting: Family Medicine

## 2020-01-01 DIAGNOSIS — Z961 Presence of intraocular lens: Secondary | ICD-10-CM | POA: Diagnosis not present

## 2020-01-12 ENCOUNTER — Encounter: Payer: Self-pay | Admitting: Family Medicine

## 2020-01-12 ENCOUNTER — Ambulatory Visit (INDEPENDENT_AMBULATORY_CARE_PROVIDER_SITE_OTHER): Payer: Medicare Other | Admitting: Family Medicine

## 2020-01-12 VITALS — BP 122/76 | HR 66 | Temp 98.0°F | Resp 18 | Ht 62.0 in | Wt 209.6 lb

## 2020-01-12 DIAGNOSIS — M8580 Other specified disorders of bone density and structure, unspecified site: Secondary | ICD-10-CM | POA: Diagnosis not present

## 2020-01-12 DIAGNOSIS — Z780 Asymptomatic menopausal state: Secondary | ICD-10-CM | POA: Diagnosis not present

## 2020-01-12 DIAGNOSIS — Z0000 Encounter for general adult medical examination without abnormal findings: Secondary | ICD-10-CM

## 2020-01-12 NOTE — Patient Instructions (Addendum)
Diana Hendrix , Thank you for taking time to come for your Medicare Wellness Visit. I appreciate your ongoing commitment to your health goals. Please review the following plan we discussed and let me know if I can assist you in the future.   These are the goals we discussed: BONE DENSITY, MAMMOGRAM Pt to start wearing new glasses when available.  Return to [facility] for exercise. Continue DASH diet  This is a list of the screening recommended for you and due dates:  Health Maintenance  Topic Date Due  . Mammogram  01/27/2021  . Tetanus Vaccine  03/17/2025  . Colon Cancer Screening  02/23/2029  . Flu Shot  Completed  . DEXA scan (bone density measurement)  Completed  . COVID-19 Vaccine  Completed  .  Hepatitis C: One time screening is recommended by Center for Disease Control  (CDC) for  adults born from [birth year] through 1964.   Completed  . Pneumonia vaccines  Completed  DASH Eating Plan DASH stands for "Dietary Approaches to Stop Hypertension." The DASH eating plan is a healthy eating plan that has been shown to reduce high blood pressure (hypertension). It may also reduce your risk for type 2 diabetes, heart disease, and stroke. The DASH eating plan may also help with weight loss. What are tips for following this plan?  General guidelines  Avoid eating more than 2,300 mg (milligrams) of salt (sodium) a day. If you have hypertension, you may need to reduce your sodium intake to 1,500 mg a day.  Limit alcohol intake to no more than 1 drink a day for nonpregnant women and 2 drinks a day for men. One drink equals 12 oz of beer, 5 oz of wine, or 1 oz of hard liquor.  Work with your health care provider to maintain a healthy body weight or to lose weight. Ask what an ideal weight is for you.  Get at least 30 minutes of exercise that causes your heart to beat faster (aerobic exercise) most days of the week. Activities may include walking, swimming, or biking.  Work with your health care provider or  diet and nutrition specialist (dietitian) to adjust your eating plan to your individual calorie needs. Reading food labels   Check food labels for the amount of sodium per serving. Choose foods with less than 5 percent of the Daily Value of sodium. Generally, foods with less than 300 mg of sodium per serving fit into this eating plan.  To find whole grains, look for the word "whole" as the first word in the ingredient list. Shopping  Buy products labeled as "low-sodium" or "no salt added."  Buy fresh foods. Avoid canned foods and premade or frozen meals. Cooking  Avoid adding salt when cooking. Use salt-free seasonings or herbs instead of table salt or sea salt. Check with your health care provider or pharmacist before using salt substitutes.  Do not fry foods. Cook foods using healthy methods such as baking, boiling, grilling, and broiling instead.  Cook with heart-healthy oils, such as olive, canola, soybean, or sunflower oil. Meal planning  Eat a balanced diet that includes: ? 5 or more servings of fruits and vegetables each day. At each meal, try to fill half of your plate with fruits and vegetables. ? Up to 6-8 servings of whole grains each day. ? Less than 6 oz of lean meat, poultry, or fish each day. A 3-oz serving of meat is about the same size as a deck of cards. One egg equals 1  oz. ? 2 servings of low-fat dairy each day. ? A serving of nuts, seeds, or beans 5 times each week. ? Heart-healthy fats. Healthy fats called Omega-3 fatty acids are found in foods such as flaxseeds and coldwater fish, like sardines, salmon, and mackerel.  Limit how much you eat of the following: ? Canned or prepackaged foods. ? Food that is high in trans fat, such as fried foods. ? Food that is high in saturated fat, such as fatty meat. ? Sweets, desserts, sugary drinks, and other foods with added sugar. ? Full-fat dairy products.  Do not salt foods before eating.  Try to eat at least 2  vegetarian meals each week.  Eat more home-cooked food and less restaurant, buffet, and fast food.  When eating at a restaurant, ask that your food be prepared with less salt or no salt, if possible. What foods are recommended? The items listed may not be a complete list. Talk with your dietitian about what dietary choices are best for you. Grains Whole-grain or whole-wheat bread. Whole-grain or whole-wheat pasta. Brown rice. Diana Hendrix. Bulgur. Whole-grain and low-sodium cereals. Pita bread. Low-fat, low-sodium crackers. Whole-wheat flour tortillas. Vegetables Fresh or frozen vegetables (raw, steamed, roasted, or grilled). Low-sodium or reduced-sodium tomato and vegetable juice. Low-sodium or reduced-sodium tomato sauce and tomato paste. Low-sodium or reduced-sodium canned vegetables. Fruits All fresh, dried, or frozen fruit. Canned fruit in natural juice (without added sugar). Meat and other protein foods Skinless chicken or [place]. Ground chicken or [place]. Pork with fat trimmed off. Fish and seafood. Egg whites. Dried beans, peas, or lentils. Unsalted nuts, nut butters, and seeds. Unsalted canned beans. Lean cuts of beef with fat trimmed off. Low-sodium, lean deli meat. Dairy Low-fat (1%) or fat-free (skim) milk. Fat-free, low-fat, or reduced-fat cheeses. Nonfat, low-sodium ricotta or cottage cheese. Low-fat or nonfat yogurt. Low-fat, low-sodium cheese. Fats and oils Soft margarine without trans fats. Vegetable oil. Low-fat, reduced-fat, or light mayonnaise and salad dressings (reduced-sodium). Canola, safflower, olive, soybean, and sunflower oils. Avocado. Seasoning and other foods Herbs. Spices. Seasoning mixes without salt. Unsalted popcorn and pretzels. Fat-free sweets. What foods are not recommended? The items listed may not be a complete list. Talk with your dietitian about what dietary choices are best for you. Grains Baked goods made with fat, such as croissants, muffins, or  some breads. Dry pasta or rice meal packs. Vegetables Creamed or fried vegetables. Vegetables in a cheese sauce. Regular canned vegetables (not low-sodium or reduced-sodium). Regular canned tomato sauce and paste (not low-sodium or reduced-sodium). Regular tomato and vegetable juice (not low-sodium or reduced-sodium). Diana Hendrix. Olives. Fruits Canned fruit in a light or heavy syrup. Fried fruit. Fruit in cream or butter sauce. Meat and other protein foods Fatty cuts of meat. Ribs. Fried meat. Berniece Salines. Sausage. Bologna and other processed lunch meats. Salami. Fatback. Hotdogs. Bratwurst. Salted nuts and seeds. Canned beans with added salt. Canned or smoked fish. Whole eggs or egg yolks. Chicken or [place] with skin. Dairy Whole or 2% milk, cream, and half-and-half. Whole or full-fat cream cheese. Whole-fat or sweetened yogurt. Full-fat cheese. Nondairy creamers. Whipped toppings. Processed cheese and cheese spreads. Fats and oils Butter. Stick margarine. Lard. Shortening. Ghee. Bacon fat. Tropical oils, such as coconut, palm kernel, or palm oil. Seasoning and other foods Salted popcorn and pretzels. Onion salt, garlic salt, seasoned salt, table salt, and sea salt. Worcestershire sauce. Tartar sauce. Barbecue sauce. Teriyaki sauce. Soy sauce, including reduced-sodium. Steak sauce. Canned and packaged gravies. Fish sauce. Oyster sauce.  Cocktail sauce. Horseradish that you find on the shelf. Ketchup. Mustard. Meat flavorings and tenderizers. Bouillon cubes. Hot sauce and Tabasco sauce. Premade or packaged marinades. Premade or packaged taco seasonings. Relishes. Regular salad dressings. Where to find more information:  National Heart, Lung, and Blood Institute: PopSteam.is  American Heart Association: www.heart.org Summary  The DASH eating plan is a healthy eating plan that has been shown to reduce high blood pressure (hypertension). It may also reduce your risk for type 2 diabetes, heart disease,  and stroke.  With the DASH eating plan, you should limit salt (sodium) intake to 2,300 mg a day. If you have hypertension, you may need to reduce your sodium intake to 1,500 mg a day.  When on the DASH eating plan, aim to eat more fresh fruits and vegetables, whole grains, lean proteins, low-fat dairy, and heart-healthy fats.  Work with your health care provider or diet and nutrition specialist (dietitian) to adjust your eating plan to your individual calorie needs. This information is not intended to replace advice given to you by your health care provider. Make sure you discuss any questions you have with your health care provider. Document Revised: 12/15/2016 Document Reviewed: 12/27/2015 Elsevier Patient Education  2020 [employer].  List the Names of Other Physician/Practitioners you currently use: 1.  Dr. Gavin Potters 2. Dr. Charlies Constable machine 3. Dr. Ihor Dow Medicine-strain muscle-right forearm-MRI scheduled Jan 5th 4. Dr. Munley-cardiology 5. Dr. Purnell Shoemaker

## 2020-01-12 NOTE — Progress Notes (Signed)
Subjective:    Diana Hendrix is a 74 y.o. female who presents for Medicare Annual/Subsequent preventive examination.  Preventive Screening-Counseling & Management  Tobacco Social History   Tobacco Use  Smoking Status Never Smoker  Smokeless Tobacco Never Used     Problems Prior to Visit 1. Needs new glasses-ordered prior to Christmas 2. Needs DEXA and mammogram -h/o of osteopenia  Current Problems (verified) Patient Active Problem List   Diagnosis Date Noted  . Positive ANA (antinuclear antibody) 12/10/2019  . Elevated sedimentation rate 12/10/2019  . Pain of right upper extremity 11/10/2019  . Plantar fasciitis 09/09/2019  . Heel cord tightness, right 09/09/2019  . Benign essential hypertension 08/14/2019  . Dyspnea on exertion 08/11/2019  . Chest tightness 04/25/2019  . 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 Constipation 10/16/2018  . Vasovagal syncope 04/16/2017  . Impaired fasting blood sugar 10/11/2016  . S/P right knee arthroscopy 09/21/2016  . Other specified postprocedural states 09/21/2016  . Routine general medical examination at a health care facility 03/18/2015  . Hyperlipidemia 09/17/2013  . Hemorrhoid 09/17/2013  . Morbid obesity (HCC) 09/17/2013  . OSA (obstructive sleep apnea) 09/17/2012  . GERD (gastroesophageal reflux disease) 09/17/2012  . Hypertension 09/17/2012    Medications Prior to Visit Current Outpatient Medications on File Prior to Visit  Medication Sig Dispense Refill  . amitriptyline (ELAVIL) 10 MG tablet TAKE 2 TABLETS(20 MG) BY MOUTH AT BEDTIME 180 tablet 1  . Cholecalciferol (VITAMIN D3) 50 MCG (2000 UT) TABS Take 1 tablet by mouth daily.    . citalopram (CELEXA) 10 MG tablet TAKE 1 TABLET(10 MG) BY MOUTH DAILY 90 tablet 1  . Coenzyme Q10 (COQ10 PO) Take 1 tablet by mouth every morning.    . gabapentin (NEURONTIN) 100 MG capsule Take 1-3 capsules (100-300 mg total) by mouth at bedtime as needed (nerve pain). 30 capsule 3  .  linaclotide (LINZESS) 290 MCG CAPS capsule TAKE 1 CAPSULE(290 MCG) BY MOUTH DAILY BEFORE BREAKFAST 90 capsule 1  . metoprolol succinate (TOPROL-XL) 50 MG 24 hr tablet Take 1.5 tablets (75 mg total) by mouth daily. Take with or immediately following a meal. 90 tablet 1  . olmesartan-hydrochlorothiazide (BENICAR HCT) 40-12.5 MG tablet Take 1 tablet by mouth daily. 90 tablet 1  . pantoprazole (PROTONIX) 40 MG tablet One daily 90 tablet 1  . Polyethyl Glycol-Propyl Glycol (SYSTANE OP) Place 1 drop into both eyes 4 (four) times daily.    . polyethylene glycol powder (GLYCOLAX/MIRALAX) powder MIX 1 CAPFUL IN 8 OUNCES OF WATER AND DRINK ONCE A DAY 527 g 3  . Probiotic Product (PROBIOTIC DAILY PO) Take 1 tablet by mouth daily.    . simvastatin (ZOCOR) 20 MG tablet TAKE 1 TABLET(20 MG) BY MOUTH AT BEDTIME 90 tablet 0  . TURMERIC PO Take 500 mg by mouth daily.    . furosemide (LASIX) 20 MG tablet Take 1 tablet (20 mg total) by mouth 3 (three) times a week. Please take on Monday, Wednesday, and Friday 45 tablet 3   No current facility-administered medications on file prior to visit.    Current Medications (verified) Current Outpatient Medications  Medication Sig Dispense Refill  . amitriptyline (ELAVIL) 10 MG tablet TAKE 2 TABLETS(20 MG) BY MOUTH AT BEDTIME 180 tablet 1  . Cholecalciferol (VITAMIN D3) 50 MCG (2000 UT) TABS Take 1 tablet by mouth daily.    . citalopram (CELEXA) 10 MG tablet TAKE 1 TABLET(10 MG) BY MOUTH DAILY 90 tablet 1  . Coenzyme Q10 (COQ10  PO) Take 1 tablet by mouth every morning.    . gabapentin (NEURONTIN) 100 MG capsule Take 1-3 capsules (100-300 mg total) by mouth at bedtime as needed (nerve pain). 30 capsule 3  . linaclotide (LINZESS) 290 MCG CAPS capsule TAKE 1 CAPSULE(290 MCG) BY MOUTH DAILY BEFORE BREAKFAST 90 capsule 1  . metoprolol succinate (TOPROL-XL) 50 MG 24 hr tablet Take 1.5 tablets (75 mg total) by mouth daily. Take with or immediately following a meal. 90 tablet 1   . olmesartan-hydrochlorothiazide (BENICAR HCT) 40-12.5 MG tablet Take 1 tablet by mouth daily. 90 tablet 1  . pantoprazole (PROTONIX) 40 MG tablet One daily 90 tablet 1  . Polyethyl Glycol-Propyl Glycol (SYSTANE OP) Place 1 drop into both eyes 4 (four) times daily.    . polyethylene glycol powder (GLYCOLAX/MIRALAX) powder MIX 1 CAPFUL IN 8 OUNCES OF WATER AND DRINK ONCE A DAY 527 g 3  . Probiotic Product (PROBIOTIC DAILY PO) Take 1 tablet by mouth daily.    . simvastatin (ZOCOR) 20 MG tablet TAKE 1 TABLET(20 MG) BY MOUTH AT BEDTIME 90 tablet 0  . TURMERIC PO Take 500 mg by mouth daily.    . furosemide (LASIX) 20 MG tablet Take 1 tablet (20 mg total) by mouth 3 (three) times a week. Please take on Monday, Wednesday, and Friday 45 tablet 3   No current facility-administered medications for this visit.     Allergies (verified) Ibuprofen   PAST HISTORY  Family History Family History  Problem Relation Age of Onset  . Hypertension Mother   . Atrial fibrillation Mother   . CAD Mother   . Congestive Heart Failure Mother   . CVA Father   . Hypertension Brother   . Hypertension Son   . Colon cancer Neg Hx   . Esophageal cancer Neg Hx     Social History Social History   Tobacco Use  . Smoking status: Never Smoker  . Smokeless tobacco: Never Used  Substance Use Topics  . Alcohol use: No     Are there smokers in your home Risk Factors Current exercise habits: [employer] yoga in the past-right arm pain Dietary issues discussed: Dr. Michel Santee loss-DASH diet  Cardiac risk factors: .  Depression Screen PHQ 2 Activities of Daily Living In your present state of health, do you have any difficulty performing the following activities?:  Driving? No concerns Managing money?  No concerns Feeding yourself? No concerns Getting from bed to chair? No concerns Climbing a flight of stairs? No concerns Preparing food and eating?: No concerns Bathing or showering? No concerns Getting dressed:  no concerns Getting to the toilet? No concerns Using the toilet:{yes/no  No concerns Moving around from place to place: off balance at times-no falls In the past year have you fallen or had a near fall?: no    Do you have more than one partner?  husband  Hearing Difficulties: no concerns  Do you feel that you have a problem with memory? No concerns  Do you feel safe at home? No concerns-lives with husband  Cognitive Testing   Advanced Directives have been discussed with the patient? Pt has a living will-children aware  List the Names of Other Physician/Practitioners you currently use: 1.  Dr. Jefm Bryant 2. Dr. Orma Flaming machine 3. Dr. Daiva Huge Medicine-strain muscle-right forearm-MRI scheduled Jan 5th 4. Dr. Munley-cardiology  Immunization History  Administered Date(s) Administered  . Fluad Quad(high Dose 65+) 10/16/2018, 11/10/2019  . Influenza, High Dose Seasonal PF 10/09/2016, 10/04/2017  . Influenza,inj,Quad PF,6+  Mos 10/25/2012, 09/17/2013, 09/21/2015  . Influenza-Unspecified 12/12/2014  . Moderna Sars-Covid-2 Vaccination 02/28/2019, 03/28/2019, 11/14/2019  . Pneumococcal Conjugate-13 03/18/2015  . Pneumococcal Polysaccharide-23 02/09/2011, 09/17/2013  . Tdap 03/18/2015  . Zoster 09/17/2011    Screening Tests Health Maintenance  Topic Date Due  . MAMMOGRAM  01/27/2021  . TETANUS/TDAP  03/17/2025  . COLONOSCOPY (Pts 45-72yrs Insurance coverage will need to be confirmed)  02/23/2029  . INFLUENZA VACCINE  Completed  . DEXA SCAN  Completed  . COVID-19 Vaccine  Completed  . Hepatitis C Screening  Completed  . PNA vac Low Risk Adult  Completed    All answers were reviewed with the patient and necessary referrals were made:  Mertha Baars, MD   01/12/2020    Objective:     Vision by Snellen chart: bilat 20/40-new glasses ordered Body mass index is 38.34 kg/m. BP 122/76   Pulse 66   Temp 98 F (36.7 C)   Resp 18   Ht 5\' 2"  (1.575 m)   Wt 209 lb 9.6 oz  (95.1 kg)   SpO2 96%   BMI 38.34 kg/m       Assessment:  1. Osteopenia, unspecified location-post menopause DEXA Pt took fosamax in the past-stopped as completed 10+ years of medication. Stopped calcium-takes Vit D 2. Medicare annual wellness visit, subsequent Needs mammogram-pt calls when updated notice received-pt knows she needs glasses-ordered after eye exam recently Plan:    During the course of the visit the patient was educated and counseled about appropriate screening and preventive services including:  Medicare Attestation I have personally reviewed: The patient's medical and social history Their use of alcohol, tobacco or illicit drugs Their current medications and supplements The patient's functional ability including ADLs,fall risks, home safety risks, cognitive, and hearing and visual impairment Diet and physical activities Evidence for depression or mood disorders  The patient's weight, height, BMI, and visual acuity have been recorded in the chart.  I have made referrals, counseling, and provided education to the patient based on review of the above and I have provided the patient with a written personalized care plan for preventive services.     Mertha Baars, MD   01/12/2020

## 2020-01-21 ENCOUNTER — Ambulatory Visit
Admission: RE | Admit: 2020-01-21 | Discharge: 2020-01-21 | Disposition: A | Discharge status: Home / Self Care | Payer: Medicare Other | Source: Ambulatory Visit | Attending: Family Medicine | Admitting: Family Medicine

## 2020-01-21 ENCOUNTER — Other Ambulatory Visit: Payer: Self-pay

## 2020-01-21 DIAGNOSIS — M79601 Pain in right arm: Secondary | ICD-10-CM

## 2020-01-21 DIAGNOSIS — M4802 Spinal stenosis, cervical region: Secondary | ICD-10-CM | POA: Diagnosis not present

## 2020-01-21 DIAGNOSIS — M79631 Pain in right forearm: Secondary | ICD-10-CM

## 2020-01-21 NOTE — Progress Notes (Signed)
MRI cervical spine shows possible pinched nerve on the right at C3-C4 and C4-C5 which is less likely to cause forearm pain.  Possible pinched nerve a little bit further down at C6-C7.  Recommend recheck in clinic in the near future.  MRI of the forearm results are still pending

## 2020-01-22 ENCOUNTER — Ambulatory Visit: Payer: Medicare Other | Admitting: Cardiology

## 2020-01-22 ENCOUNTER — Ambulatory Visit
Admission: RE | Admit: 2020-01-22 | Discharge: 2020-01-22 | Disposition: A | Discharge status: Home / Self Care | Payer: Medicare Other | Source: Ambulatory Visit | Attending: Family Medicine | Admitting: Family Medicine

## 2020-01-22 ENCOUNTER — Encounter: Payer: Self-pay | Admitting: Family Medicine

## 2020-01-22 DIAGNOSIS — S46211A Strain of muscle, fascia and tendon of other parts of biceps, right arm, initial encounter: Secondary | ICD-10-CM | POA: Diagnosis not present

## 2020-01-22 DIAGNOSIS — R531 Weakness: Secondary | ICD-10-CM | POA: Diagnosis not present

## 2020-01-22 DIAGNOSIS — M79631 Pain in right forearm: Secondary | ICD-10-CM | POA: Diagnosis not present

## 2020-01-22 DIAGNOSIS — R600 Localized edema: Secondary | ICD-10-CM | POA: Diagnosis not present

## 2020-01-22 NOTE — Progress Notes (Signed)
You have a partial biceps tear as the biceps tendon attaches in the forearm.  Additionally you have changes that are consistent with tennis elbow.  Both of these almost certainly explain the pain that you are having.  We will discuss this in your cervical MRI in more detail at follow-up appointment tomorrow.

## 2020-01-22 NOTE — Progress Notes (Signed)
I, Diana Hendrix, LAT, ATC, am serving as scribe for Dr. Clementeen Graham.  Diana Hendrix is a 75 y.o. female who presents to [facility] at [facility] today for c-spine, forearm pain, and MRI review. Pt was last seen by Dr. Denyse Hendrix on 12/25/19 and reported no change / slight worsening of her R radial forearm pain and radiating R arm pain and was advised to recheck after MRI's. A R UE NCV/EMG test was also ordered and a referral to neurology was placed.  Today, pt reports that her R forearm pain remains about the same and locates her pain from her R proximal, anterior elbow radiating down the R forearm.  She also reports some newer R superior shoulder pain.  She has been taking the Gabapentin bid and Tylenol.  Dx imaging: 01/22/20 R forearm MRI  01/21/20 C-spine MRI  11/25/19 C-spine XR  11/25/19 R wrist XR    Pertinent review of systems: no fever or chills  Relevant historical information: HTN   Exam:  BP 120/86 (BP Location: Right Arm, Patient Position: Sitting, Cuff Size: Normal)   Pulse (!) 58   Ht 5\' 2"  (1.575 m)   Wt 209 lb 12.8 oz (95.2 kg)   SpO2 98%   BMI 38.37 kg/m  General: Well Developed, well nourished, and in no acute distress.   MSK: Right arm normal-appearing. Mildly tender palpation in the cubital fossa and distal biceps tendon and insertion site of distal biceps tendon.  Pain with resisted elbow flexion.  C-spine normal cervical motion.    Lab and Radiology Results No results found for this or any previous visit (from the past 72 hour(s)). MR CERVICAL SPINE WO CONTRAST  Result Date: 01/21/2020 CLINICAL DATA:  Neck pain and right arm pain for 4 months EXAM: MRI CERVICAL SPINE WITHOUT CONTRAST TECHNIQUE: Multiplanar, multisequence MR imaging of the cervical spine was performed. No intravenous contrast was administered. COMPARISON:  X-ray 11/25/2019 FINDINGS: Alignment: Physiologic. Vertebrae: No fracture, evidence of discitis, or bone lesion. Cord: Normal  signal and morphology. Posterior Fossa, vertebral arteries, paraspinal tissues: Negative. Disc levels: C2-C3: No significant disc protrusion, foraminal stenosis, or canal stenosis. C3-C4: No significant disc protrusion. Mild bilateral facet arthropathy and mild right uncovertebral spurring. Mild right foraminal stenosis. No canal stenosis. C4-C5: No significant disc protrusion. Mild right and uncovertebral spurring resulting in mild right foraminal stenosis. No canal stenosis. C5-C6: No significant disc protrusion. Mild left facet arthropathy. No foraminal stenosis or canal stenosis. C6-C7: Small left paracentral disc protrusion contacts the ventral cord without canal stenosis. Bilateral foramina are patent. C7-T1: No significant disc protrusion, foraminal stenosis, or canal stenosis. IMPRESSION: 1. Mild multilevel cervical spondylosis with mild right foraminal stenosis at C3-C4 and C4-5. 2. Small left paracentral disc protrusion at C6-C7 contacts the ventral cord without canal stenosis. 3. No canal stenosis at any level. Electronically Signed   By: 13/09/2019 D.O.   On: 01/21/2020 11:35   MR FOREARM RIGHT WO CONTRAST  Result Date: 01/22/2020 CLINICAL DATA:  Proximal right forearm pain for 4 months, weakness EXAM: MRI OF THE RIGHT FOREARM WITHOUT CONTRAST TECHNIQUE: Multiplanar, multisequence MR imaging of the right forearm was performed. No intravenous contrast was administered. COMPARISON:  Right wrist x-ray 11/25/2019 FINDINGS: Bones/Joint/Cartilage No acute fracture. No dislocation. There is mild bone marrow edema within the radial tuberosity. Osseous structures appear otherwise within normal limits. No wrist or elbow joint effusion. No suspicious marrow replacing bone lesion. Ligaments Visualized ligamentous structures appear intact. Muscles and Tendons  Marked tendinosis with low-grade partial-thickness tearing of the distal biceps tendon just proximal to its insertion on the radial tuberosity (series  10, images 31-33). There is associated peritendinous edema and a trace amount of fluid within the bicipitoradialis bursa. Mild tendinosis with low-grade partial-thickness tear involving the common extensor tendon origin at the lateral humeral epicondyle (series 10, images 37-38). Remaining visualized tendinous structures appear within normal limits. There is intramuscular edema within the deep supinator muscle, likely reactive secondary to adjacent distal biceps tendon pathology. Remaining visualized musculature is otherwise normal in bulk and signal intensity. Soft tissues No soft tissue fluid collection or hematoma. IMPRESSION: 1. Marked tendinosis with partial-thickness tearing of the distal biceps tendon. Associated peritendinous edema and a trace amount of fluid within the bicipitoradialis bursa, likely reactive. Mild reactive bone marrow edema within the radial tuberosity. 2. Mild tendinosis with low-grade partial-thickness tear involving the common extensor tendon origin at the lateral humeral epicondyle. 3. No acute osseous abnormality of the right forearm. Electronically Signed   By: Davina Poke D.O.   On: 01/22/2020 14:37    I, [name], personally (independently) visualized and performed the interpretation of the images attached in this note.    Assessment and Plan: 75 y.o. female with right arm pain multifactorial.  Majority of patients arm pain is due to distal biceps tendinitis with partial tear.  I believe her more distal symptoms are probably peripheral nerve irritation secondary to the inflammation around the biceps tendon.  Unfortunately she is already had a pretty reasonable trial of conservative management including dedicated physical therapy with little benefit.  Based on this I think it is worthwhile for her to have a surgical opinion with hand surgery to discuss surgical options for this issue.  I am very reluctant to proceed with steroid injection for this issue.  Injecting  steroids near a already partially torn biceps tendon is likely to increase her risk of rupture.  She does have some more proximal arm pain into her shoulder which could be referred pain from the elbow or could be C5 cervical radiculopathy.  She does have some mild neuroforaminal stenosis that could cause cervical radiculopathy at the C5 dermatomal pattern.  This is a much less likely or less dominant issue.  If not improved after treatment for the biceps tendon would consider epidural steroid injection.  Okay to continue gabapentin for now.   PDMP not reviewed this encounter. Orders Placed This Encounter  Procedures  . Ambulatory referral to Orthopedic Surgery    Referral Priority:   Routine    Referral Type:   Surgical    Referral Reason:   Specialty Services Required    Requested Specialty:   Orthopedic Surgery    Number of Visits Requested:   1   No orders of the defined types were placed in this encounter.    Discussed warning signs or symptoms. Please see discharge instructions. Patient expresses understanding.   The above documentation has been reviewed and is accurate and complete [name], M.D. Total encounter time 30 minutes including face-to-face time with the patient and, reviewing past medical record, and charting on the date of service.   MRI reviewed treatment plan and options.

## 2020-01-23 ENCOUNTER — Other Ambulatory Visit: Payer: Self-pay

## 2020-01-23 ENCOUNTER — Encounter: Payer: Self-pay | Admitting: Family Medicine

## 2020-01-23 ENCOUNTER — Ambulatory Visit (INDEPENDENT_AMBULATORY_CARE_PROVIDER_SITE_OTHER): Payer: Medicare Other | Admitting: Family Medicine

## 2020-01-23 VITALS — BP 120/86 | HR 58 | Ht 62.0 in | Wt 209.8 lb

## 2020-01-23 DIAGNOSIS — M79601 Pain in right arm: Secondary | ICD-10-CM

## 2020-01-23 DIAGNOSIS — M7521 Bicipital tendinitis, right shoulder: Secondary | ICD-10-CM

## 2020-01-23 NOTE — Patient Instructions (Addendum)
Thank you for coming in today.  You should hear from the orthopedic surgeon soon.   Let me know if you do not hear anything.   They may recommend a retry with PT now that we know the problem.   Let me know how you are doing.   There may be a little part of this that is coming from the neck but I dont think that is much of the pain.  We could consider a neck injection at the radiologist office if needed.

## 2020-01-24 ENCOUNTER — Other Ambulatory Visit: Payer: Self-pay | Admitting: Family Medicine

## 2020-01-24 DIAGNOSIS — M79601 Pain in right arm: Secondary | ICD-10-CM

## 2020-01-26 NOTE — Telephone Encounter (Signed)
Diana Hendrix is going to check about scheduling.

## 2020-01-27 DIAGNOSIS — H35359 Cystoid macular degeneration, unspecified eye: Secondary | ICD-10-CM | POA: Insufficient documentation

## 2020-01-27 DIAGNOSIS — Z86010 Personal history of colonic polyps: Secondary | ICD-10-CM | POA: Insufficient documentation

## 2020-01-27 DIAGNOSIS — C801 Malignant (primary) neoplasm, unspecified: Secondary | ICD-10-CM | POA: Insufficient documentation

## 2020-01-27 DIAGNOSIS — I5030 Unspecified diastolic (congestive) heart failure: Secondary | ICD-10-CM | POA: Insufficient documentation

## 2020-01-29 DIAGNOSIS — S46291D Other injury of muscle, fascia and tendon of other parts of biceps, right arm, subsequent encounter: Secondary | ICD-10-CM | POA: Diagnosis not present

## 2020-01-29 DIAGNOSIS — M79601 Pain in right arm: Secondary | ICD-10-CM | POA: Diagnosis not present

## 2020-02-03 ENCOUNTER — Ambulatory Visit: Payer: Medicare Other | Admitting: Cardiology

## 2020-02-04 ENCOUNTER — Encounter: Payer: Medicare Other | Admitting: Neurology

## 2020-02-05 ENCOUNTER — Encounter: Payer: Self-pay | Admitting: Acute Care

## 2020-02-10 ENCOUNTER — Encounter: Payer: Self-pay | Admitting: Family Medicine

## 2020-02-10 DIAGNOSIS — Z1231 Encounter for screening mammogram for malignant neoplasm of breast: Secondary | ICD-10-CM | POA: Diagnosis not present

## 2020-02-13 DIAGNOSIS — M8580 Other specified disorders of bone density and structure, unspecified site: Secondary | ICD-10-CM | POA: Diagnosis not present

## 2020-02-13 DIAGNOSIS — M85852 Other specified disorders of bone density and structure, left thigh: Secondary | ICD-10-CM | POA: Diagnosis not present

## 2020-02-13 DIAGNOSIS — Z780 Asymptomatic menopausal state: Secondary | ICD-10-CM | POA: Diagnosis not present

## 2020-03-01 DIAGNOSIS — L820 Inflamed seborrheic keratosis: Secondary | ICD-10-CM | POA: Diagnosis not present

## 2020-03-01 DIAGNOSIS — D2239 Melanocytic nevi of other parts of face: Secondary | ICD-10-CM | POA: Diagnosis not present

## 2020-03-01 DIAGNOSIS — D2339 Other benign neoplasm of skin of other parts of face: Secondary | ICD-10-CM | POA: Diagnosis not present

## 2020-03-01 DIAGNOSIS — L821 Other seborrheic keratosis: Secondary | ICD-10-CM | POA: Diagnosis not present

## 2020-03-01 DIAGNOSIS — B078 Other viral warts: Secondary | ICD-10-CM | POA: Diagnosis not present

## 2020-03-01 DIAGNOSIS — D225 Melanocytic nevi of trunk: Secondary | ICD-10-CM | POA: Diagnosis not present

## 2020-03-01 DIAGNOSIS — L738 Other specified follicular disorders: Secondary | ICD-10-CM | POA: Diagnosis not present

## 2020-03-01 DIAGNOSIS — L649 Androgenic alopecia, unspecified: Secondary | ICD-10-CM | POA: Diagnosis not present

## 2020-03-01 DIAGNOSIS — R208 Other disturbances of skin sensation: Secondary | ICD-10-CM | POA: Diagnosis not present

## 2020-03-03 NOTE — Progress Notes (Unsigned)
Cardiology Office Note:    Date:  03/04/2020   ID:  Diana Hendrix, DOB [birth year], MRN [id number]  PCP:  Rochel Brome, MD  Cardiologist:  Shirlee More, MD    Referring MD: Hoyt Koch, *    ASSESSMENT:    1. Essential hypertension   2. Swelling of left lower extremity    PLAN:    In order of problems listed above:  1. Her blood pressure is well controlled on her current medical regimen including beta-blocker ARB thiazide intermittent loop diuretic.  Continue current treatment most recent creatinine stable 0.99. 2. I think her edema is predominantly due to sleep apnea treated find continue the low-dose diuretic that is helpful good blood pressure control and testing does not show evidence of structural heart disease.   Next appointment: As needed I think she can follow-up with her primary care physician for hypertension   Medication Adjustments/Labs and Tests Ordered: Current medicines are reviewed at length with the patient today.  Concerns regarding medicines are outlined above.  No orders of the defined types were placed in this encounter.  No orders of the defined types were placed in this encounter.   Chief Complaint  Patient presents with  . Follow-up  . Hypertension    History of Present Illness:    Diana Hendrix is a 75 y.o. female with a hx of hypertensive heart disease hyperlipidemia obstructive sleep apnea on CPAP with shortness of breath and edema last seen 07/18/2019.  Following that visit she is day 3-day ZIO monitor showing rare supraventricular ectopy and no episodes of atrial fibrillation or flutter.  Myocardial perfusion study showed EF of 73% no ischemia and her echocardiogram showed mild LVH and diastolic dysfunction and her proBNP level was normal.  Reviewed her echocardiogram although it was called pseudonormal diastolic function it is most consistent with impaired relaxation with normal left ventricular end-diastolic  pressure.  Compliance with diet, lifestyle and medications: Yes  Doing better blood pressure ranges from 120/70 in the morning 130/80 in the evening.  She has taken a loop diuretic 3 days a week and developed supple edema in between and is able to tolerate CPAP.  I do not think she has congestive heart failure.  She recently has enrolled in Dr. Alyse Low practice her blood pressure is well controlled sleep apnea is treated she has had extensive cardiac testing at this point in time I will plan to see her back in the office as needed.  She is not having chest pain palpitation shortness of breath orthopnea. Unfortunately she tore her right biceps tendon partial. Past Medical History:  Diagnosis Date  . Cancer ([place])   . Diastolic heart failure (HCC)    Echo EF 60/65% Heart monitor was normal  . GERD (gastroesophageal reflux disease)   . History of colon polyps   . Hyperlipidemia   . Hypertension   . Macular edema, cystoid   . OSA (obstructive sleep apnea) 09/17/2012  . Osteopenia     Past Surgical History:  Procedure Laterality Date  . CESAREAN SECTION    . COLONOSCOPY  10/29/2013   Colonic polyp status post polypectomy. Mild sigmoid diverticulosis. Small internal hemorrhoids  . FOOT NEUROMA SURGERY     s/p surgery in both feet  . FOOT SURGERY Bilateral    Per patient  . MENISCUS REPAIR Left 09/13/2016  . miniscus repair right  Right 2019  . SHOULDER SURGERY Left    arthroscopy.   . TUBAL LIGATION  Current Medications: Current Meds  Medication Sig  . amitriptyline (ELAVIL) 10 MG tablet TAKE 2 TABLETS(20 MG) BY MOUTH AT BEDTIME  . Calcium Carbonate (CALCIUM 600 PO) Take 1 tablet by mouth daily.  . Cholecalciferol (VITAMIN D3) 50 MCG (2000 UT) TABS Take 1 tablet by mouth daily.  . citalopram (CELEXA) 10 MG tablet TAKE 1 TABLET(10 MG) BY MOUTH DAILY  . Coenzyme Q10 (COQ10 PO) Take 1 tablet by mouth every morning.  . furosemide (LASIX) 20 MG tablet Take 20 mg by mouth daily.  Marland Kitchen  linaclotide (LINZESS) 290 MCG CAPS capsule TAKE 1 CAPSULE(290 MCG) BY MOUTH DAILY BEFORE BREAKFAST  . metoprolol succinate (TOPROL-XL) 50 MG 24 hr tablet Take 1.5 tablets (75 mg total) by mouth daily. Take with or immediately following a meal.  . olmesartan-hydrochlorothiazide (BENICAR HCT) 40-12.5 MG tablet Take 1 tablet by mouth daily.  . pantoprazole (PROTONIX) 40 MG tablet One daily  . Polyethyl Glycol-Propyl Glycol (SYSTANE OP) Place 1 drop into both eyes 4 (four) times daily.  . polyethylene glycol powder (GLYCOLAX/MIRALAX) powder MIX 1 CAPFUL IN 8 OUNCES OF WATER AND DRINK ONCE A DAY  . Probiotic Product (PROBIOTIC DAILY PO) Take 1 tablet by mouth daily.  . simvastatin (ZOCOR) 20 MG tablet TAKE 1 TABLET(20 MG) BY MOUTH AT BEDTIME  . TURMERIC PO Take 500 mg by mouth daily.     Allergies:   Ibuprofen   Social History   Socioeconomic History  . Marital status: Married    Spouse name: Not on file  . Number of children: 2  . Years of education: Not on file  . Highest education level: Not on file  Occupational History  . Not on file  Tobacco Use  . Smoking status: Never Smoker  . Smokeless tobacco: Never Used  Vaping Use  . Vaping Use: Never used  Substance and Sexual Activity  . Alcohol use: No  . Drug use: No  . Sexual activity: Not Currently  Other Topics Concern  . Not on file  Social History Narrative  . Not on file   Social Determinants of Health   Financial Resource Strain: Not on file  Food Insecurity: Not on file  Transportation Needs: Not on file  Physical Activity: Not on file  Stress: Not on file  Social Connections: Not on file     Family History: The patient's family history includes Atrial fibrillation in her mother; CAD in her mother; CVA in her father; Congestive Heart Failure in her mother; Hypertension in her brother, mother, and son. There is no history of Colon cancer or Esophageal cancer. ROS:   Please see the history of present illness.     All other systems reviewed and are negative.  EKGs/Labs/Other Studies Reviewed:    The following studies were reviewed today:    Recent Labs: 07/18/2019: NT-Pro BNP 52 12/23/2019: ALT 15; BUN 16; Creatinine, Ser 0.99; Hemoglobin 13.4; Platelets 299; Potassium 4.6; Sodium 134; TSH 1.640  Recent Lipid Panel    Component Value Date/Time   CHOL 198 12/23/2019 1123   TRIG 173 (H) 12/23/2019 1123   HDL 45 12/23/2019 1123   CHOLHDL 4.4 12/23/2019 1123   CHOLHDL 4 04/24/2019 0913   VLDL 20.6 04/24/2019 0913   LDLCALC 122 (H) 12/23/2019 1123    Physical Exam:    VS:  BP 106/66 (BP Location: Left Arm, Patient Position: Sitting)   Pulse 70   Ht 5\' 2"  (1.575 m)   Wt 208 lb 9.6 oz (94.6 kg)  SpO2 97%   BMI 38.15 kg/m     Wt Readings from Last 3 Encounters:  03/04/20 208 lb 9.6 oz (94.6 kg)  01/23/20 209 lb 12.8 oz (95.2 kg)  01/12/20 209 lb 9.6 oz (95.1 kg)     GEN:  Well nourished, well developed in no acute distress HEENT: Normal NECK: No JVD; No carotid bruits LYMPHATICS: No lymphadenopathy CARDIAC: RRR, no murmurs, rubs, gallops RESPIRATORY:  Clear to auscultation without rales, wheezing or rhonchi  ABDOMEN: Soft, non-tender, non-distended MUSCULOSKELETAL:  No edema; No deformity  SKIN: Warm and dry NEUROLOGIC:  Alert and oriented x 3 PSYCHIATRIC:  Normal affect    Signed, Shirlee More, MD  03/04/2020 11:05 AM    [place]

## 2020-03-04 ENCOUNTER — Ambulatory Visit (INDEPENDENT_AMBULATORY_CARE_PROVIDER_SITE_OTHER): Payer: Medicare Other | Admitting: Cardiology

## 2020-03-04 ENCOUNTER — Encounter: Payer: Self-pay | Admitting: Cardiology

## 2020-03-04 ENCOUNTER — Other Ambulatory Visit: Payer: Self-pay

## 2020-03-04 VITALS — BP 106/66 | HR 70 | Ht 62.0 in | Wt 208.6 lb

## 2020-03-04 DIAGNOSIS — I10 Essential (primary) hypertension: Secondary | ICD-10-CM

## 2020-03-04 DIAGNOSIS — M7989 Other specified soft tissue disorders: Secondary | ICD-10-CM | POA: Diagnosis not present

## 2020-03-04 NOTE — Patient Instructions (Signed)

## 2020-03-12 DIAGNOSIS — S46291D Other injury of muscle, fascia and tendon of other parts of biceps, right arm, subsequent encounter: Secondary | ICD-10-CM | POA: Diagnosis not present

## 2020-04-19 ENCOUNTER — Other Ambulatory Visit: Payer: Self-pay | Admitting: Family Medicine

## 2020-05-21 ENCOUNTER — Ambulatory Visit (INDEPENDENT_AMBULATORY_CARE_PROVIDER_SITE_OTHER): Payer: Medicare Other | Admitting: Family Medicine

## 2020-05-21 ENCOUNTER — Other Ambulatory Visit: Payer: Self-pay

## 2020-05-21 VITALS — BP 100/56 | HR 60 | Temp 97.3°F | Resp 16 | Ht 62.0 in | Wt 209.6 lb

## 2020-05-21 DIAGNOSIS — M5412 Radiculopathy, cervical region: Secondary | ICD-10-CM | POA: Diagnosis not present

## 2020-05-21 DIAGNOSIS — M722 Plantar fascial fibromatosis: Secondary | ICD-10-CM | POA: Diagnosis not present

## 2020-05-21 MED ORDER — TRIAMCINOLONE ACETONIDE 40 MG/ML IJ SUSP
20.0000 mg | Freq: Once | INTRAMUSCULAR | Status: AC
  Administered 2020-05-21: 20 mg via INTRA_ARTICULAR

## 2020-05-21 NOTE — Progress Notes (Signed)
Acute Office Visit  Subjective:    Patient ID: Diana Hendrix, female    DOB: [birth year], 75 y.o.   MRN: [id number]  Chief Complaint  Patient presents with  . Foot Pain    right    HPI Patient is in today for right foot plantar fasciitis increased pain in 1 week.  Pt saw orthopedics (Dr. Georgina Snell)  for rt arm and shoulder pain.  MRI showed biceps tear and mri of neck 3 cervical vertebrae impingements mild at c3-c5. Saw Dr. Veverly Fells who suggested monitoring.  Dr. Veverly Fells referred her to an arm/shoulder sports medicine doctor, Dr. Stann Mainland, who did not feel it was due to her biceps but was concerned about neuropathy from neck. It is overall better and hurts the most when you get up or with certain movements. Did rehab for one month for arm and neck. Did not feel gabapentin helped.   Past Medical History:  Diagnosis Date  . Cancer ([place])   . Diastolic heart failure (HCC)    Echo EF 60/65% Heart monitor was normal  . GERD (gastroesophageal reflux disease)   . History of colon polyps   . Hyperlipidemia   . Hypertension   . Macular edema, cystoid   . OSA (obstructive sleep apnea) 09/17/2012  . Osteopenia     Past Surgical History:  Procedure Laterality Date  . CESAREAN SECTION    . COLONOSCOPY  10/29/2013   Colonic polyp status post polypectomy. Mild sigmoid diverticulosis. Small internal hemorrhoids  . FOOT NEUROMA SURGERY     s/p surgery in both feet  . FOOT SURGERY Bilateral    Per patient  . MENISCUS REPAIR Left 09/13/2016  . miniscus repair right  Right 2019  . SHOULDER SURGERY Left    arthroscopy.   . TUBAL LIGATION      Family History  Problem Relation Age of Onset  . Hypertension Mother   . Atrial fibrillation Mother   . CAD Mother   . Congestive Heart Failure Mother   . CVA Father   . Hypertension Brother   . Hypertension Son   . Colon cancer Neg Hx   . Esophageal cancer Neg Hx     Social History   Socioeconomic History  . Marital status: Married    Spouse  name: Not on file  . Number of children: 2  . Years of education: Not on file  . Highest education level: Not on file  Occupational History  . Not on file  Tobacco Use  . Smoking status: Never Smoker  . Smokeless tobacco: Never Used  Vaping Use  . Vaping Use: Never used  Substance and Sexual Activity  . Alcohol use: No  . Drug use: No  . Sexual activity: Not Currently  Other Topics Concern  . Not on file  Social History Narrative  . Not on file   Social Determinants of Health   Financial Resource Strain: Not on file  Food Insecurity: Not on file  Transportation Needs: Not on file  Physical Activity: Not on file  Stress: Not on file  Social Connections: Not on file  Intimate Partner Violence: Not on file    Outpatient Medications Prior to Visit  Medication Sig Dispense Refill  . amitriptyline (ELAVIL) 10 MG tablet TAKE 2 TABLETS(20 MG) BY MOUTH AT BEDTIME (Patient taking differently: Take 10 mg by mouth at bedtime. TAKE 1 TABLETS(20 MG) BY MOUTH AT BEDTIME) 180 tablet 1  . Calcium Carbonate (CALCIUM 600 PO) Take 1 tablet by mouth  daily.    . Cholecalciferol (VITAMIN D3) 50 MCG (2000 UT) TABS Take 1 tablet by mouth daily.    . citalopram (CELEXA) 10 MG tablet TAKE 1 TABLET(10 MG) BY MOUTH DAILY 90 tablet 1  . Coenzyme Q10 (COQ10 PO) Take 1 tablet by mouth every morning.    . furosemide (LASIX) 20 MG tablet Take 20 mg by mouth 3 (three) times a week. Takes only on Monday, Wed, and Friday    . linaclotide (LINZESS) 290 MCG CAPS capsule TAKE 1 CAPSULE(290 MCG) BY MOUTH DAILY BEFORE BREAKFAST 90 capsule 1  . metoprolol succinate (TOPROL-XL) 50 MG 24 hr tablet Take 1.5 tablets (75 mg total) by mouth daily. Take with or immediately following a meal. 90 tablet 1  . olmesartan-hydrochlorothiazide (BENICAR HCT) 40-12.5 MG tablet Take 1 tablet by mouth daily. 90 tablet 1  . pantoprazole (PROTONIX) 40 MG tablet One daily 90 tablet 1  . Polyethyl Glycol-Propyl Glycol (SYSTANE OP) Place 1  drop into both eyes 4 (four) times daily.    . polyethylene glycol powder (GLYCOLAX/MIRALAX) powder MIX 1 CAPFUL IN 8 OUNCES OF WATER AND DRINK ONCE A DAY 527 g 3  . Probiotic Product (PROBIOTIC DAILY PO) Take 1 tablet by mouth daily.    . simvastatin (ZOCOR) 20 MG tablet TAKE 1 TABLET(20 MG) BY MOUTH AT BEDTIME 90 tablet 0  . TURMERIC PO Take 500 mg by mouth daily.     No facility-administered medications prior to visit.    Allergies  Allergen Reactions  . Ibuprofen Other (See Comments)    Pt states Dr doesn't wont her to take it; not allergic    Review of Systems  Constitutional: Negative for chills, fatigue and fever.  HENT: Negative for congestion, ear pain and sore throat.   Respiratory: Negative for cough and shortness of breath.   Cardiovascular: Negative for chest pain.  Gastrointestinal: Negative for abdominal pain, constipation, diarrhea, nausea and vomiting.  Genitourinary: Negative for dysuria and urgency.  Musculoskeletal: Positive for arthralgias (rt arm pain. plantar heel pain.) and back pain. Negative for myalgias.  Neurological: Negative for dizziness and headaches.       Objective:    Physical Exam Vitals reviewed.  Constitutional:      Appearance: Normal appearance.  Cardiovascular:     Rate and Rhythm: Normal rate and regular rhythm.     Heart sounds: Normal heart sounds.  Pulmonary:     Effort: Pulmonary effort is normal.     Breath sounds: Normal breath sounds.  Musculoskeletal:        General: Tenderness (rt plantar fascia. left heel nontender. ) present.  Neurological:     Mental Status: She is alert.     BP (!) 100/56   Pulse 60   Temp (!) 97.3 F (36.3 C)   Resp 16   Ht 5\' 2"  (1.575 m)   Wt 209 lb 9.6 oz (95.1 kg)   BMI 38.34 kg/m  Wt Readings from Last 3 Encounters:  05/21/20 209 lb 9.6 oz (95.1 kg)  03/04/20 208 lb 9.6 oz (94.6 kg)  01/23/20 209 lb 12.8 oz (95.2 kg)    Health Maintenance Due  Topic Date Due  . COVID-19 Vaccine  (4 - Booster for Moderna series) 02/14/2020    There are no preventive care reminders to display for this patient.   Lab Results  Component Value Date   TSH 1.640 12/23/2019   Lab Results  Component Value Date   WBC 6.8 12/23/2019   HGB  13.4 12/23/2019   HCT 41.0 12/23/2019   MCV 91 12/23/2019   PLT 299 12/23/2019   Lab Results  Component Value Date   NA 134 12/23/2019   K 4.6 12/23/2019   CO2 24 12/23/2019   GLUCOSE 95 12/23/2019   BUN 16 12/23/2019   CREATININE 0.99 12/23/2019   BILITOT 0.5 12/23/2019   ALKPHOS 90 12/23/2019   AST 17 12/23/2019   ALT 15 12/23/2019   PROT 7.2 12/23/2019   ALBUMIN 4.3 12/23/2019   CALCIUM 9.7 12/23/2019   ANIONGAP 12 04/17/2017   GFR 55.96 (L) 11/25/2019   Lab Results  Component Value Date   CHOL 198 12/23/2019   Lab Results  Component Value Date   HDL 45 12/23/2019   Lab Results  Component Value Date   LDLCALC 122 (H) 12/23/2019   Lab Results  Component Value Date   TRIG 173 (H) 12/23/2019   Lab Results  Component Value Date   CHOLHDL 4.4 12/23/2019   Lab Results  Component Value Date   HGBA1C 6.0 (H) 12/23/2019       Assessment & Plan:  1. Plantar fasciitis of right foot - triamcinolone acetonide (KENALOG-40) injection 20 mg Risks were discussed including bleeding, infection, increase in sugars if diabetic, atrophy at site of injection, and increased pain.  After consent was obtained, using sterile technique the rt heel was prepped with betadine and alcohol.  Kenalog 20 mg and 5 ml plain Lidocaine was then injected and the needle withdrawn.  The procedure was well tolerated.   The patient is asked to continue to rest the joint for a few more days before resuming regular activities.  It may be more painful for the first 1-2 days.  Watch for fever, or increased swelling or persistent pain in the joint. Call or return to clinic prn if such symptoms occur or there is failure to improve as anticipated.  2. Cervical  radiculopathy - Ambulatory referral to Neurology    Meds ordered this encounter  Medications  . triamcinolone acetonide (KENALOG-40) injection 20 mg    Orders Placed This Encounter  Procedures  . Ambulatory referral to Neurology     Follow-up: No follow-ups on file.  An After Visit Summary was printed and given to the patient.  Rochel Brome, MD [facility] [phone]

## 2020-05-30 ENCOUNTER — Encounter: Payer: Self-pay | Admitting: Family Medicine

## 2020-06-12 ENCOUNTER — Other Ambulatory Visit: Payer: Self-pay | Admitting: Family Medicine

## 2020-06-12 DIAGNOSIS — K219 Gastro-esophageal reflux disease without esophagitis: Secondary | ICD-10-CM

## 2020-06-21 ENCOUNTER — Ambulatory Visit (INDEPENDENT_AMBULATORY_CARE_PROVIDER_SITE_OTHER): Payer: Medicare Other

## 2020-06-21 ENCOUNTER — Other Ambulatory Visit: Payer: Self-pay

## 2020-06-21 ENCOUNTER — Ambulatory Visit (INDEPENDENT_AMBULATORY_CARE_PROVIDER_SITE_OTHER): Payer: Medicare Other | Admitting: Family Medicine

## 2020-06-21 ENCOUNTER — Other Ambulatory Visit: Payer: Self-pay | Admitting: Family Medicine

## 2020-06-21 ENCOUNTER — Encounter: Payer: Self-pay | Admitting: Family Medicine

## 2020-06-21 VITALS — BP 112/62 | HR 59 | Temp 97.3°F | Ht 62.0 in | Wt 207.0 lb

## 2020-06-21 DIAGNOSIS — Z6837 Body mass index (BMI) 37.0-37.9, adult: Secondary | ICD-10-CM | POA: Diagnosis not present

## 2020-06-21 DIAGNOSIS — K5904 Chronic idiopathic constipation: Secondary | ICD-10-CM

## 2020-06-21 DIAGNOSIS — F5101 Primary insomnia: Secondary | ICD-10-CM

## 2020-06-21 DIAGNOSIS — I110 Hypertensive heart disease with heart failure: Secondary | ICD-10-CM

## 2020-06-21 DIAGNOSIS — K219 Gastro-esophageal reflux disease without esophagitis: Secondary | ICD-10-CM

## 2020-06-21 DIAGNOSIS — Z23 Encounter for immunization: Secondary | ICD-10-CM | POA: Diagnosis not present

## 2020-06-21 DIAGNOSIS — F330 Major depressive disorder, recurrent, mild: Secondary | ICD-10-CM | POA: Diagnosis not present

## 2020-06-21 DIAGNOSIS — E782 Mixed hyperlipidemia: Secondary | ICD-10-CM

## 2020-06-21 DIAGNOSIS — I5032 Chronic diastolic (congestive) heart failure: Secondary | ICD-10-CM

## 2020-06-21 DIAGNOSIS — R7303 Prediabetes: Secondary | ICD-10-CM | POA: Diagnosis not present

## 2020-06-21 DIAGNOSIS — G4733 Obstructive sleep apnea (adult) (pediatric): Secondary | ICD-10-CM

## 2020-06-21 MED ORDER — METOPROLOL SUCCINATE ER 50 MG PO TB24: 75.0000 mg | ORAL_TABLET | Freq: Every day | ORAL | 1 refills | Status: DC

## 2020-06-21 MED ORDER — PANTOPRAZOLE SODIUM 40 MG PO TBEC
DELAYED_RELEASE_TABLET | ORAL | 1 refills | Status: DC
Start: 2020-06-21 — End: 2021-04-12

## 2020-06-21 NOTE — Patient Instructions (Signed)
Recommend continue to work on eating healthy diet and exercise. May add dulcolax as we discussed.

## 2020-06-21 NOTE — Progress Notes (Signed)
Subjective:  Patient ID: Diana Hendrix, female    DOB: 08/18/1945  Age: 75 y.o. MRN: [id number]  Chief Complaint  Patient presents with  . Hyperlipidemia  . Depression    HPI Hypertensive heart disease with chronic diastolic congestive heart failure (HCC) Lasix 20 mg TID and Metoprolol 50 mg daily, Benicar 40-12.5 daily. BP is well controlled. Saw Nephrology several years ago.   OSA (obstructive sleep apnea) Uses cpap every night. Helps with sleep and feels rested.  Sees Dr. Halford Chessman.  Gastroesophageal reflux disease without esophagitis Taking pantoprazole 40 mg daily.  Mixed hyperlipidemia Takes simvastatin 20 mg daily  Tries to eat healthy  Mild recurrent major depression (HCC) Takes celexa 10 mg daily. Stopped amitriptyline which was given to help her sleep. She was feeling off balance and discontinued this and it has resolved.   Chronic idiopathic constipation Takes miralax and linzess 290 mcg, does c/o feeling gassy. Miralax causes gas. Previously took sennakot, but it did not work. Takes dulcolax a couple times a week. Tried fiber did not help.   Current Outpatient Medications on File Prior to Visit  Medication Sig Dispense Refill  . Calcium Carbonate (CALCIUM 600 PO) Take 1 tablet by mouth daily.    . Cholecalciferol (VITAMIN D3) 50 MCG (2000 UT) TABS Take 1 tablet by mouth daily.    . citalopram (CELEXA) 10 MG tablet TAKE 1 TABLET(10 MG) BY MOUTH DAILY 90 tablet 1  . Coenzyme Q10 (COQ10 PO) Take 1 tablet by mouth every morning.    . furosemide (LASIX) 20 MG tablet Take 20 mg by mouth 3 (three) times a week. Takes only on Monday, Wed, and Friday    . linaclotide (LINZESS) 290 MCG CAPS capsule TAKE 1 CAPSULE(290 MCG) BY MOUTH DAILY BEFORE BREAKFAST 90 capsule 1  . olmesartan-hydrochlorothiazide (BENICAR HCT) 40-12.5 MG tablet Take 1 tablet by mouth daily. 90 tablet 1  . Polyethyl Glycol-Propyl Glycol (SYSTANE OP) Place 1 drop into both eyes 4 (four) times daily.    .  polyethylene glycol powder (GLYCOLAX/MIRALAX) powder MIX 1 CAPFUL IN 8 OUNCES OF WATER AND DRINK ONCE A DAY 527 g 3  . Probiotic Product (PROBIOTIC DAILY PO) Take 1 tablet by mouth daily.    . simvastatin (ZOCOR) 20 MG tablet TAKE 1 TABLET(20 MG) BY MOUTH AT BEDTIME 90 tablet 0   No current facility-administered medications on file prior to visit.   Past Medical History:  Diagnosis Date  . Cancer ([place])   . Diastolic heart failure (HCC)    Echo EF 60/65% Heart monitor was normal  . GERD (gastroesophageal reflux disease)   . History of colon polyps   . Hyperlipidemia   . Hypertension   . Macular edema, cystoid   . OSA (obstructive sleep apnea) 09/17/2012  . Osteopenia    Past Surgical History:  Procedure Laterality Date  . CESAREAN SECTION    . COLONOSCOPY  10/29/2013   Colonic polyp status post polypectomy. Mild sigmoid diverticulosis. Small internal hemorrhoids  . FOOT NEUROMA SURGERY     s/p surgery in both feet  . FOOT SURGERY Bilateral    Per patient  . MENISCUS REPAIR Left 09/13/2016  . miniscus repair right  Right 2019  . SHOULDER SURGERY Left    arthroscopy.   . TUBAL LIGATION      Family History  Problem Relation Age of Onset  . Hypertension Mother   . Atrial fibrillation Mother   . CAD Mother   . Congestive Heart Failure Mother   .  CVA Father   . Hypertension Brother   . Hypertension Son   . Colon cancer Neg Hx   . Esophageal cancer Neg Hx    Social History   Socioeconomic History  . Marital status: Married    Spouse name: Not on file  . Number of children: 2  . Years of education: Not on file  . Highest education level: Not on file  Occupational History  . Not on file  Tobacco Use  . Smoking status: Never Smoker  . Smokeless tobacco: Never Used  Vaping Use  . Vaping Use: Never used  Substance and Sexual Activity  . Alcohol use: No  . Drug use: No  . Sexual activity: Not Currently  Other Topics Concern  . Not on file  Social History Narrative   . Not on file   Social Determinants of Health   Financial Resource Strain: Not on file  Food Insecurity: Not on file  Transportation Needs: Not on file  Physical Activity: Not on file  Stress: Not on file  Social Connections: Not on file    Review of Systems  Constitutional: Negative for chills, fatigue and fever.  HENT: Positive for rhinorrhea and sore throat (Itchy). Negative for congestion and ear pain.   Respiratory: Negative for cough and shortness of breath.   Cardiovascular: Negative for chest pain.  Gastrointestinal: Positive for constipation. Negative for abdominal pain, diarrhea, nausea and vomiting.  Genitourinary: Negative for dysuria and urgency.  Musculoskeletal: Negative for arthralgias, back pain and myalgias.  Allergic/Immunologic: Positive for environmental allergies.  Neurological: Negative for dizziness, weakness, light-headedness and headaches.  Psychiatric/Behavioral: Negative for dysphoric mood. The patient is not nervous/anxious.      Objective:  BP 112/62   Pulse (!) 59   Temp (!) 97.3 F (36.3 C)   Ht 5\' 2"  (1.575 m)   Wt 207 lb (93.9 kg)   SpO2 98%   BMI 37.86 kg/m   BP/Weight 06/21/2020 05/21/2020 04/24/8117  Systolic BP 147 829 562  Diastolic BP 62 56 66  Wt. (Lbs) 207 209.6 208.6  BMI 37.86 38.34 38.15    Physical Exam Vitals reviewed.  Constitutional:      Appearance: Normal appearance. She is normal weight.  Neck:     Vascular: No carotid bruit.  Cardiovascular:     Rate and Rhythm: Normal rate and regular rhythm.     Pulses: Normal pulses.     Heart sounds: Normal heart sounds.  Pulmonary:     Effort: Pulmonary effort is normal. No respiratory distress.     Breath sounds: Normal breath sounds.  Abdominal:     General: Abdomen is flat. Bowel sounds are normal.     Palpations: Abdomen is soft.     Tenderness: There is no abdominal tenderness.  Neurological:     Mental Status: She is alert and oriented to person, place, and  time.  Psychiatric:        Mood and Affect: Mood normal.        Behavior: Behavior normal.     Lab Results  Component Value Date   WBC 6.8 12/23/2019   HGB 13.4 12/23/2019   HCT 41.0 12/23/2019   PLT 299 12/23/2019   GLUCOSE 95 12/23/2019   CHOL 198 12/23/2019   TRIG 173 (H) 12/23/2019   HDL 45 12/23/2019   LDLCALC 122 (H) 12/23/2019   ALT 15 12/23/2019   AST 17 12/23/2019   NA 134 12/23/2019   K 4.6 12/23/2019   CL 96  12/23/2019   CREATININE 0.99 12/23/2019   BUN 16 12/23/2019   CO2 24 12/23/2019   TSH 1.640 12/23/2019   HGBA1C 6.0 (H) 12/23/2019      Assessment & Plan:   1. Hypertensive heart disease with chronic diastolic congestive heart failure ([place]) Well controlled.  No changes to medicines.  Continue to work on eating a healthy diet and exercise.  Labs drawn today.  - Comprehensive metabolic panel - metoprolol succinate (TOPROL-XL) 50 MG 24 hr tablet; Take 1.5 tablets (75 mg total) by mouth daily. Take with or immediately following a meal.  Dispense: 90 tablet; Refill: 1 - CCM pharmacy monitoring  2. OSA (obstructive sleep apnea) Continue cpap.  Management per specialist.  - CCM pharmacy monitoring  3. Gastroesophageal reflux disease without esophagitis The current medical regimen is effective;  continue present plan and medications. Consider change to H2 blocker.  - pantoprazole (PROTONIX) 40 MG tablet; TAKE 1 TABLET BY MOUTH DAILY  Dispense: 90 tablet; Refill: 1 - CCM pharmacy monitoring  4. Mixed hyperlipidemia Well controlled.  No changes to medicines.  Continue to work on eating a healthy diet and exercise.  Labs drawn today.  - CBC with Differential/Platelet - Lipid panel - CCM pharmacy monitoring  5. Mild recurrent major depression (HCC) The current medical regimen is effective;  continue present plan and medications. - CCM pharmacy monitoring  6. Chronic idiopathic constipation May add dulcolax 1-2 times per week. Continue linzess 290  mcg once daily. Stop miralax.   7. Class 2 severe obesity due to excess calories with serious comorbidity and body mass index (BMI) of 37.0 to 37.9 in adult [facility]) Recommend continue to work on eating healthy diet and exercise.  8. Prediabetes - Hemoglobin A1c   Meds ordered this encounter  Medications  . metoprolol succinate (TOPROL-XL) 50 MG 24 hr tablet    Sig: Take 1.5 tablets (75 mg total) by mouth daily. Take with or immediately following a meal.    Dispense:  90 tablet    Refill:  1  . pantoprazole (PROTONIX) 40 MG tablet    Sig: TAKE 1 TABLET BY MOUTH DAILY    Dispense:  90 tablet    Refill:  1    Orders Placed This Encounter  Procedures  . CBC with Differential/Platelet  . Comprehensive metabolic panel  . Lipid panel  . Hemoglobin A1c  . CCM pharmacy monitoring     Follow-up: Return in about 3 months (around 09/21/2020) for fasting.  An After Visit Summary was printed and given to the patient.  Rochel Brome, MD [facility] [phone]

## 2020-06-22 LAB — CBC WITH DIFFERENTIAL/PLATELET
Basophils Absolute: 0 10*3/uL (ref 0.0–0.2)
Basos: 0 %
EOS (ABSOLUTE): 0.1 10*3/uL (ref 0.0–0.4)
Eos: 2 %
Hematocrit: 39.6 % (ref 34.0–46.6)
Hemoglobin: 13.6 g/dL (ref 11.1–15.9)
Immature Grans (Abs): 0 10*3/uL (ref 0.0–0.1)
Immature Granulocytes: 0 %
Lymphocytes Absolute: 2.1 10*3/uL (ref 0.7–3.1)
Lymphs: 30 %
MCH: 30.3 pg (ref 26.6–33.0)
MCHC: 34.3 g/dL (ref 31.5–35.7)
MCV: 88 fL (ref 79–97)
Monocytes Absolute: 0.6 10*3/uL (ref 0.1–0.9)
Monocytes: 8 %
Neutrophils Absolute: 4.1 10*3/uL (ref 1.4–7.0)
Neutrophils: 60 %
Platelets: 289 10*3/uL (ref 150–450)
RBC: 4.49 x10E6/uL (ref 3.77–5.28)
RDW: 12.8 % (ref 11.7–15.4)
WBC: 6.9 10*3/uL (ref 3.4–10.8)

## 2020-06-22 LAB — COMPREHENSIVE METABOLIC PANEL
ALT: 15 IU/L (ref 0–32)
AST: 22 IU/L (ref 0–40)
Albumin/Globulin Ratio: 1.5 (ref 1.2–2.2)
Albumin: 4.5 g/dL (ref 3.7–4.7)
Alkaline Phosphatase: 87 IU/L (ref 44–121)
BUN/Creatinine Ratio: 14 (ref 12–28)
BUN: 14 mg/dL (ref 8–27)
Bilirubin Total: 0.5 mg/dL (ref 0.0–1.2)
CO2: 22 mmol/L (ref 20–29)
Calcium: 9.7 mg/dL (ref 8.7–10.3)
Chloride: 93 mmol/L — ABNORMAL LOW (ref 96–106)
Creatinine, Ser: 1.01 mg/dL — ABNORMAL HIGH (ref 0.57–1.00)
Globulin, Total: 3 g/dL (ref 1.5–4.5)
Glucose: 92 mg/dL (ref 65–99)
Potassium: 4.8 mmol/L (ref 3.5–5.2)
Sodium: 131 mmol/L — ABNORMAL LOW (ref 134–144)
Total Protein: 7.5 g/dL (ref 6.0–8.5)
eGFR: 59 mL/min/{1.73_m2} — ABNORMAL LOW (ref >59)

## 2020-06-22 LAB — HEMOGLOBIN A1C
Est. average glucose Bld gHb Est-mCnc: 126 mg/dL
Hgb A1c MFr Bld: 6 % — ABNORMAL HIGH (ref 4.8–5.6)

## 2020-06-22 LAB — LIPID PANEL
Chol/HDL Ratio: 3.4 ratio (ref 0.0–4.4)
Cholesterol, Total: 177 mg/dL (ref 100–199)
HDL: 52 mg/dL (ref >39)
LDL Chol Calc (NIH): 101 mg/dL — ABNORMAL HIGH (ref 0–99)
Triglycerides: 137 mg/dL (ref 0–149)
VLDL Cholesterol Cal: 24 mg/dL (ref 5–40)

## 2020-06-22 LAB — CARDIOVASCULAR RISK ASSESSMENT

## 2020-07-07 ENCOUNTER — Other Ambulatory Visit: Payer: Self-pay | Admitting: Family Medicine

## 2020-07-07 DIAGNOSIS — K5904 Chronic idiopathic constipation: Secondary | ICD-10-CM

## 2020-07-11 ENCOUNTER — Other Ambulatory Visit: Payer: Self-pay | Admitting: Family Medicine

## 2020-07-11 DIAGNOSIS — I110 Hypertensive heart disease with heart failure: Secondary | ICD-10-CM

## 2020-07-17 ENCOUNTER — Other Ambulatory Visit: Payer: Self-pay | Admitting: Nurse Practitioner

## 2020-07-20 ENCOUNTER — Ambulatory Visit (INDEPENDENT_AMBULATORY_CARE_PROVIDER_SITE_OTHER): Payer: Medicare Other | Admitting: Family Medicine

## 2020-07-20 ENCOUNTER — Other Ambulatory Visit: Payer: Self-pay | Admitting: Family Medicine

## 2020-07-20 ENCOUNTER — Encounter: Payer: Self-pay | Admitting: Family Medicine

## 2020-07-20 ENCOUNTER — Other Ambulatory Visit: Payer: Self-pay

## 2020-07-20 VITALS — BP 118/68 | HR 70 | Temp 97.1°F | Ht 62.0 in | Wt 211.0 lb

## 2020-07-20 DIAGNOSIS — J301 Allergic rhinitis due to pollen: Secondary | ICD-10-CM

## 2020-07-20 DIAGNOSIS — R0602 Shortness of breath: Secondary | ICD-10-CM

## 2020-07-20 DIAGNOSIS — I5032 Chronic diastolic (congestive) heart failure: Secondary | ICD-10-CM

## 2020-07-20 DIAGNOSIS — R600 Localized edema: Secondary | ICD-10-CM | POA: Diagnosis not present

## 2020-07-20 MED ORDER — FUROSEMIDE 20 MG PO TABS: 20.0000 mg | ORAL_TABLET | Freq: Every day | ORAL | 1 refills | Status: DC

## 2020-07-20 NOTE — Progress Notes (Signed)
Acute Office Visit  Subjective:    Patient ID: Diana Hendrix, female    DOB: 1945/06/28, 75 y.o.   MRN: [id number]  Chief Complaint  Patient presents with   Shortness of Breath    HPI Patient is in today for SOB x 2-3 weeks, lower extremity edema, has been elevating legs, currently on lasix 20 mg M-W-F and Benicar HCT 40-12.5 mg daily. Does not generally add salt to her food or eat foods high in sodium. Left leg always swells more than right leg. It feel similar to how she felt prior to when Dr.Munley added lasix a year ago. Mild orthopnea. Does not have asthma or copd. She sees Dr. Halford Chessman. Dr. Halford Chessman manages her OSA. Using cpap.  No chest pain.   Past Medical History:  Diagnosis Date   Cancer ([place])    Diastolic heart failure (HCC)    Echo EF 60/65% Heart monitor was normal   GERD (gastroesophageal reflux disease)    History of colon polyps    Hyperlipidemia    Hypertension    Macular edema, cystoid    OSA (obstructive sleep apnea) 09/17/2012   Osteopenia     Past Surgical History:  Procedure Laterality Date   CESAREAN SECTION     COLONOSCOPY  10/29/2013   Colonic polyp status post polypectomy. Mild sigmoid diverticulosis. Small internal hemorrhoids   FOOT NEUROMA SURGERY     s/p surgery in both feet   FOOT SURGERY Bilateral    Per patient   MENISCUS REPAIR Left 09/13/2016   miniscus repair right  Right 2019   SHOULDER SURGERY Left    arthroscopy.    TUBAL LIGATION      Family History  Problem Relation Age of Onset   Hypertension Mother    Atrial fibrillation Mother    CAD Mother    Congestive Heart Failure Mother    CVA Father    Hypertension Brother    Hypertension Son    Colon cancer Neg Hx    Esophageal cancer Neg Hx     Social History   Socioeconomic History   Marital status: Married    Spouse name: Not on file   Number of children: 2   Years of education: Not on file   Highest education level: Not on file  Occupational History   Not on file   Tobacco Use   Smoking status: Never   Smokeless tobacco: Never  Vaping Use   Vaping Use: Never used  Substance and Sexual Activity   Alcohol use: No   Drug use: No   Sexual activity: Not Currently  Other Topics Concern   Not on file  Social History Narrative   Not on file   Social Determinants of Health   Financial Resource Strain: Not on file  Food Insecurity: Not on file  Transportation Needs: Not on file  Physical Activity: Not on file  Stress: Not on file  Social Connections: Not on file  Intimate Partner Violence: Not on file    Outpatient Medications Prior to Visit  Medication Sig Dispense Refill   Calcium Carbonate (CALCIUM 600 PO) Take 1 tablet by mouth daily.     Cholecalciferol (VITAMIN D3) 50 MCG (2000 UT) TABS Take 1 tablet by mouth daily.     citalopram (CELEXA) 10 MG tablet TAKE 1 TABLET(10 MG) BY MOUTH DAILY 90 tablet 1   Coenzyme Q10 (COQ10 PO) Take 1 tablet by mouth every morning.     LINZESS 290 MCG CAPS capsule TAKE  1 CAPSULE(290 MCG) BY MOUTH DAILY BEFORE AND BREAKFAST 90 capsule 1   metoprolol succinate (TOPROL-XL) 50 MG 24 hr tablet Take 1.5 tablets (75 mg total) by mouth daily. Take with or immediately following a meal. 90 tablet 1   olmesartan-hydrochlorothiazide (BENICAR HCT) 40-12.5 MG tablet TAKE 1 TABLET BY MOUTH DAILY 90 tablet 1   pantoprazole (PROTONIX) 40 MG tablet TAKE 1 TABLET BY MOUTH DAILY 90 tablet 1   Polyethyl Glycol-Propyl Glycol (SYSTANE OP) Place 1 drop into both eyes 4 (four) times daily.     polyethylene glycol powder (GLYCOLAX/MIRALAX) powder MIX 1 CAPFUL IN 8 OUNCES OF WATER AND DRINK ONCE A DAY 527 g 3   Probiotic Product (PROBIOTIC DAILY PO) Take 1 tablet by mouth daily.     simvastatin (ZOCOR) 20 MG tablet TAKE 1 TABLET(20 MG) BY MOUTH AT BEDTIME 90 tablet 0   furosemide (LASIX) 20 MG tablet Take 20 mg by mouth 3 (three) times a week. Takes only on Monday, Wed, and Friday     No facility-administered medications prior to  visit.    Allergies  Allergen Reactions   Ibuprofen Other (See Comments)    Pt states Dr doesn't wont her to take it; not allergic    Review of Systems  Constitutional:  Negative for chills, fatigue and fever.  HENT:  Positive for rhinorrhea. Negative for congestion, ear pain, postnasal drip, sinus pressure, sinus pain and sore throat.   Respiratory:  Positive for shortness of breath. Negative for cough, chest tightness and wheezing.   Cardiovascular:  Positive for leg swelling. Negative for chest pain.  Gastrointestinal:  Negative for abdominal pain, diarrhea and nausea.  Neurological:  Negative for dizziness, weakness, light-headedness, numbness and headaches.      Objective:    Physical Exam Vitals reviewed.  Constitutional:      Appearance: Normal appearance. She is normal weight.  HENT:     Right Ear: Tympanic membrane normal.     Left Ear: Tympanic membrane normal.     Nose: Congestion present.     Comments: Pale edema    Mouth/Throat:     Mouth: Mucous membranes are dry.  Neck:     Vascular: No carotid bruit.  Cardiovascular:     Rate and Rhythm: Normal rate and regular rhythm.     Heart sounds: Normal heart sounds.  Pulmonary:     Effort: Pulmonary effort is normal. No respiratory distress.     Breath sounds: Normal breath sounds.  Abdominal:     General: Abdomen is flat.  Musculoskeletal:     Right lower leg: Edema (trace) present.     Left lower leg: Edema (trace) present.  Neurological:     Mental Status: She is alert and oriented to person, place, and time.  Psychiatric:        Mood and Affect: Mood normal.        Behavior: Behavior normal.    BP 118/68   Pulse 70   Temp (!) 97.1 F (36.2 C)   Ht '5\' 2"'  (1.575 m)   Wt 211 lb (95.7 kg)   SpO2 99%   BMI 38.59 kg/m  Wt Readings from Last 3 Encounters:  07/20/20 211 lb (95.7 kg)  06/21/20 207 lb (93.9 kg)  05/21/20 209 lb 9.6 oz (95.1 kg)    Health Maintenance Due  Topic Date Due   Zoster  Vaccines- Shingrix (1 of 2) Never done    There are no preventive care reminders to display for this  patient.   Lab Results  Component Value Date   TSH 1.640 12/23/2019   Lab Results  Component Value Date   WBC 6.9 06/21/2020   HGB 13.6 06/21/2020   HCT 39.6 06/21/2020   MCV 88 06/21/2020   PLT 289 06/21/2020   Lab Results  Component Value Date   NA 131 (L) 06/21/2020   K 4.8 06/21/2020   CO2 22 06/21/2020   GLUCOSE 92 06/21/2020   BUN 14 06/21/2020   CREATININE 1.01 (H) 06/21/2020   BILITOT 0.5 06/21/2020   ALKPHOS 87 06/21/2020   AST 22 06/21/2020   ALT 15 06/21/2020   PROT 7.5 06/21/2020   ALBUMIN 4.5 06/21/2020   CALCIUM 9.7 06/21/2020   ANIONGAP 12 04/17/2017   EGFR 59 (L) 06/21/2020   GFR 55.96 (L) 11/25/2019   Lab Results  Component Value Date   CHOL 177 06/21/2020   Lab Results  Component Value Date   HDL 52 06/21/2020   Lab Results  Component Value Date   LDLCALC 101 (H) 06/21/2020   Lab Results  Component Value Date   TRIG 137 06/21/2020   Lab Results  Component Value Date   CHOLHDL 3.4 06/21/2020   Lab Results  Component Value Date   HGBA1C 6.0 (H) 06/21/2020       Assessment & Plan:  1. Shortness of breath Increase lasix to 20 mg once daily. Continue to weigh daily.  Call if swelling and breathing is not improving or if worsening. Check weight daily.  - furosemide (LASIX) 20 MG tablet; Take 1 tablet (20 mg total) by mouth daily.  Dispense: 30 tablet; Refill: 1 - CBC with Differential/Platelet - Comprehensive metabolic panel - Pro b natriuretic peptide  2. Localized edema Increase lasix to 20 mg once daily. Continue to weigh daily.  Call if swelling and breathing is not improving or if worsening. Check weight daily.  - furosemide (LASIX) 20 MG tablet; Take 1 tablet (20 mg total) by mouth daily.  Dispense: 30 tablet; Refill: 1  3. Seasonal allergic rhinitis due to pollen Continue zyrtec.   4. Chronic diastolic heart failure  ([place])  See above.   Meds ordered this encounter  Medications   furosemide (LASIX) 20 MG tablet    Sig: Take 1 tablet (20 mg total) by mouth daily.    Dispense:  30 tablet    Refill:  1     Orders Placed This Encounter  Procedures   CBC with Differential/Platelet   Comprehensive metabolic panel   Pro b natriuretic peptide    Follow-up: Return in about 2 months (around 09/27/2020) for fasting (already scheduled).  An After Visit Summary was printed and given to the patient.  Rochel Brome, MD [facility] [phone]

## 2020-07-20 NOTE — Patient Instructions (Signed)
Increase lasix to 20 mg once daily. Continue to weigh daily.  Call if swelling and breathing is not improving or if worsening. Check weight daily.

## 2020-07-21 LAB — CBC WITH DIFFERENTIAL/PLATELET
Basophils Absolute: 0.1 10*3/uL (ref 0.0–0.2)
Basos: 1 %
EOS (ABSOLUTE): 0.2 10*3/uL (ref 0.0–0.4)
Eos: 2 %
Hematocrit: 38.9 % (ref 34.0–46.6)
Hemoglobin: 13 g/dL (ref 11.1–15.9)
Immature Grans (Abs): 0 10*3/uL (ref 0.0–0.1)
Immature Granulocytes: 0 %
Lymphocytes Absolute: 2.4 10*3/uL (ref 0.7–3.1)
Lymphs: 29 %
MCH: 29.5 pg (ref 26.6–33.0)
MCHC: 33.4 g/dL (ref 31.5–35.7)
MCV: 88 fL (ref 79–97)
Monocytes Absolute: 0.8 10*3/uL (ref 0.1–0.9)
Monocytes: 9 %
Neutrophils Absolute: 4.9 10*3/uL (ref 1.4–7.0)
Neutrophils: 59 %
Platelets: 288 10*3/uL (ref 150–450)
RBC: 4.4 x10E6/uL (ref 3.77–5.28)
RDW: 13.1 % (ref 11.7–15.4)
WBC: 8.3 10*3/uL (ref 3.4–10.8)

## 2020-07-21 LAB — COMPREHENSIVE METABOLIC PANEL
ALT: 17 IU/L (ref 0–32)
AST: 19 IU/L (ref 0–40)
Albumin/Globulin Ratio: 1.8 (ref 1.2–2.2)
Albumin: 4.5 g/dL (ref 3.7–4.7)
Alkaline Phosphatase: 85 IU/L (ref 44–121)
BUN/Creatinine Ratio: 12 (ref 12–28)
BUN: 12 mg/dL (ref 8–27)
Bilirubin Total: 0.3 mg/dL (ref 0.0–1.2)
CO2: 22 mmol/L (ref 20–29)
Calcium: 9.5 mg/dL (ref 8.7–10.3)
Chloride: 91 mmol/L — ABNORMAL LOW (ref 96–106)
Creatinine, Ser: 0.99 mg/dL (ref 0.57–1.00)
Globulin, Total: 2.5 g/dL (ref 1.5–4.5)
Glucose: 91 mg/dL (ref 65–99)
Potassium: 4.4 mmol/L (ref 3.5–5.2)
Sodium: 133 mmol/L — ABNORMAL LOW (ref 134–144)
Total Protein: 7 g/dL (ref 6.0–8.5)
eGFR: 60 mL/min/{1.73_m2} (ref >59)

## 2020-07-21 LAB — PRO B NATRIURETIC PEPTIDE: NT-Pro BNP: 90 pg/mL (ref 0–301)

## 2020-07-30 ENCOUNTER — Ambulatory Visit: Payer: Medicare Other | Admitting: Family Medicine

## 2020-07-31 ENCOUNTER — Other Ambulatory Visit: Payer: Self-pay | Admitting: Family Medicine

## 2020-07-31 DIAGNOSIS — F330 Major depressive disorder, recurrent, mild: Secondary | ICD-10-CM

## 2020-08-02 ENCOUNTER — Ambulatory Visit (INDEPENDENT_AMBULATORY_CARE_PROVIDER_SITE_OTHER): Payer: Medicare Other | Admitting: Family Medicine

## 2020-08-02 ENCOUNTER — Encounter: Payer: Self-pay | Admitting: Family Medicine

## 2020-08-02 ENCOUNTER — Other Ambulatory Visit: Payer: Self-pay

## 2020-08-02 ENCOUNTER — Ambulatory Visit: Payer: Medicare Other | Admitting: Family Medicine

## 2020-08-02 VITALS — BP 102/60 | HR 72 | Temp 97.2°F | Resp 20 | Ht 62.0 in | Wt 210.0 lb

## 2020-08-02 DIAGNOSIS — J301 Allergic rhinitis due to pollen: Secondary | ICD-10-CM

## 2020-08-02 DIAGNOSIS — F330 Major depressive disorder, recurrent, mild: Secondary | ICD-10-CM

## 2020-08-02 DIAGNOSIS — R0602 Shortness of breath: Secondary | ICD-10-CM | POA: Diagnosis not present

## 2020-08-02 DIAGNOSIS — R600 Localized edema: Secondary | ICD-10-CM | POA: Diagnosis not present

## 2020-08-02 LAB — COMPREHENSIVE METABOLIC PANEL
ALT: 15 IU/L (ref 0–32)
AST: 17 IU/L (ref 0–40)
Albumin/Globulin Ratio: 1.8 (ref 1.2–2.2)
Albumin: 4.4 g/dL (ref 3.7–4.7)
Alkaline Phosphatase: 77 IU/L (ref 44–121)
BUN/Creatinine Ratio: 13 (ref 12–28)
BUN: 13 mg/dL (ref 8–27)
Bilirubin Total: 0.3 mg/dL (ref 0.0–1.2)
CO2: 24 mmol/L (ref 20–29)
Calcium: 9.7 mg/dL (ref 8.7–10.3)
Chloride: 93 mmol/L — ABNORMAL LOW (ref 96–106)
Creatinine, Ser: 1.04 mg/dL — ABNORMAL HIGH (ref 0.57–1.00)
Globulin, Total: 2.5 g/dL (ref 1.5–4.5)
Glucose: 91 mg/dL (ref 65–99)
Potassium: 4.4 mmol/L (ref 3.5–5.2)
Sodium: 132 mmol/L — ABNORMAL LOW (ref 134–144)
Total Protein: 6.9 g/dL (ref 6.0–8.5)
eGFR: 56 mL/min/{1.73_m2} — ABNORMAL LOW (ref >59)

## 2020-08-02 MED ORDER — MONTELUKAST SODIUM 10 MG PO TABS
10.0000 mg | ORAL_TABLET | Freq: Every day | ORAL | 5 refills | Status: DC
Start: 2020-08-02 — End: 2021-01-16

## 2020-08-02 MED ORDER — CITALOPRAM HYDROBROMIDE 10 MG PO TABS
ORAL_TABLET | ORAL | 0 refills | Status: DC
Start: 2020-08-02 — End: 2020-11-01

## 2020-08-02 NOTE — Progress Notes (Signed)
Acute Office Visit  Subjective:    Patient ID: Diana Hendrix, female    DOB: 1945-04-27, 75 y.o.   MRN: [id number]  Chief Complaint  Patient presents with   Shortness of Breath   Edema    HPI Patient is in today for SOB which has not improved although pedal edema has improved with lasix 20 mg once daily. Pt has a history of diastolic heart failure, but probnp is normal.   Past Medical History:  Diagnosis Date   Cancer ([place])    Diastolic heart failure (HCC)    Echo EF 60/65% Heart monitor was normal   GERD (gastroesophageal reflux disease)    History of colon polyps    Hyperlipidemia    Hypertension    Macular edema, cystoid    OSA (obstructive sleep apnea) 09/17/2012   Osteopenia     Past Surgical History:  Procedure Laterality Date   CESAREAN SECTION     COLONOSCOPY  10/29/2013   Colonic polyp status post polypectomy. Mild sigmoid diverticulosis. Small internal hemorrhoids   FOOT NEUROMA SURGERY     s/p surgery in both feet   FOOT SURGERY Bilateral    Per patient   MENISCUS REPAIR Left 09/13/2016   miniscus repair right  Right 2019   SHOULDER SURGERY Left    arthroscopy.    TUBAL LIGATION      Family History  Problem Relation Age of Onset   Hypertension Mother    Atrial fibrillation Mother    CAD Mother    Congestive Heart Failure Mother    CVA Father    Hypertension Brother    Hypertension Son    Colon cancer Neg Hx    Esophageal cancer Neg Hx     Social History   Socioeconomic History   Marital status: Married    Spouse name: Not on file   Number of children: 2   Years of education: Not on file   Highest education level: Not on file  Occupational History   Not on file  Tobacco Use   Smoking status: Never   Smokeless tobacco: Never  Vaping Use   Vaping Use: Never used  Substance and Sexual Activity   Alcohol use: No   Drug use: No   Sexual activity: Not Currently  Other Topics Concern   Not on file  Social History Narrative   Not on  file   Social Determinants of Health   Financial Resource Strain: Not on file  Food Insecurity: Not on file  Transportation Needs: Not on file  Physical Activity: Not on file  Stress: Not on file  Social Connections: Not on file  Intimate Partner Violence: Not on file    Outpatient Medications Prior to Visit  Medication Sig Dispense Refill   cetirizine (ZYRTEC) 10 MG tablet Take 10 mg by mouth daily.     Cholecalciferol (VITAMIN D3) 50 MCG (2000 UT) TABS Take 1 tablet by mouth daily.     furosemide (LASIX) 20 MG tablet TAKE 1 TABLET(20 MG) BY MOUTH DAILY 90 tablet 0   LINZESS 290 MCG CAPS capsule TAKE 1 CAPSULE(290 MCG) BY MOUTH DAILY BEFORE AND BREAKFAST 90 capsule 1   metoprolol succinate (TOPROL-XL) 50 MG 24 hr tablet Take 1.5 tablets (75 mg total) by mouth daily. Take with or immediately following a meal. 90 tablet 1   olmesartan-hydrochlorothiazide (BENICAR HCT) 40-12.5 MG tablet TAKE 1 TABLET BY MOUTH DAILY 90 tablet 1   pantoprazole (PROTONIX) 40 MG tablet TAKE 1 TABLET  BY MOUTH DAILY 90 tablet 1   Polyethyl Glycol-Propyl Glycol (SYSTANE OP) Place 1 drop into both eyes 4 (four) times daily.     Probiotic Product (PROBIOTIC DAILY PO) Take 1 tablet by mouth daily.     simvastatin (ZOCOR) 20 MG tablet TAKE 1 TABLET(20 MG) BY MOUTH AT BEDTIME 90 tablet 0   citalopram (CELEXA) 10 MG tablet TAKE 1 TABLET(10 MG) BY MOUTH DAILY 90 tablet 1   Calcium Carbonate (CALCIUM 600 PO) Take 1 tablet by mouth daily.     Coenzyme Q10 (COQ10 PO) Take 1 tablet by mouth every morning.     polyethylene glycol powder (GLYCOLAX/MIRALAX) powder MIX 1 CAPFUL IN 8 OUNCES OF WATER AND DRINK ONCE A DAY 527 g 3   No facility-administered medications prior to visit.    Allergies  Allergen Reactions   Ibuprofen Other (See Comments)    Pt states Dr doesn't wont her to take it; not allergic    Review of Systems  Constitutional:  Positive for fatigue. Negative for chills and fever.  HENT:  Positive for  congestion and rhinorrhea.   Respiratory:  Positive for cough and shortness of breath.   Cardiovascular:  Positive for leg swelling. Negative for chest pain and palpitations.  Gastrointestinal:  Positive for constipation. Negative for abdominal pain, diarrhea and nausea.  Genitourinary:  Negative for dysuria.  Neurological:  Negative for light-headedness and headaches.  Psychiatric/Behavioral:  Negative for dysphoric mood. The patient is not nervous/anxious.       Objective:    Physical Exam Vitals reviewed.  Constitutional:      Appearance: Normal appearance. She is normal weight.  HENT:     Right Ear: Tympanic membrane normal.     Left Ear: Tympanic membrane normal.     Nose: Congestion present.     Right Turbinates: Swollen.     Left Turbinates: Swollen and pale.     Right Sinus: No maxillary sinus tenderness.     Left Sinus: No maxillary sinus tenderness.     Mouth/Throat:     Pharynx: No oropharyngeal exudate or posterior oropharyngeal erythema.  Neck:     Vascular: No carotid bruit.  Cardiovascular:     Rate and Rhythm: Normal rate and regular rhythm.     Pulses: Normal pulses.     Heart sounds: Normal heart sounds.  Pulmonary:     Effort: Pulmonary effort is normal. No respiratory distress.     Breath sounds: Normal breath sounds.  Abdominal:     General: Abdomen is flat. Bowel sounds are normal.     Palpations: Abdomen is soft.     Tenderness: There is no abdominal tenderness.  Neurological:     Mental Status: She is alert and oriented to person, place, and time.  Psychiatric:        Mood and Affect: Mood normal.        Behavior: Behavior normal.    BP 102/60 (BP Location: Left Arm, Patient Position: Sitting)   Pulse 72   Temp (!) 97.2 F (36.2 C)   Resp 20   Ht '5\' 2"'  (1.575 m)   Wt 210 lb (95.3 kg)   SpO2 99%   BMI 38.41 kg/m  Wt Readings from Last 3 Encounters:  08/02/20 210 lb (95.3 kg)  07/20/20 211 lb (95.7 kg)  06/21/20 207 lb (93.9 kg)     Health Maintenance Due  Topic Date Due   Zoster Vaccines- Shingrix (1 of 2) Never done    There are  no preventive care reminders to display for this patient.   Lab Results  Component Value Date   TSH 1.640 12/23/2019   Lab Results  Component Value Date   WBC 8.3 07/20/2020   HGB 13.0 07/20/2020   HCT 38.9 07/20/2020   MCV 88 07/20/2020   PLT 288 07/20/2020   Lab Results  Component Value Date   NA 133 (L) 07/20/2020   K 4.4 07/20/2020   CO2 22 07/20/2020   GLUCOSE 91 07/20/2020   BUN 12 07/20/2020   CREATININE 0.99 07/20/2020   BILITOT 0.3 07/20/2020   ALKPHOS 85 07/20/2020   AST 19 07/20/2020   ALT 17 07/20/2020   PROT 7.0 07/20/2020   ALBUMIN 4.5 07/20/2020   CALCIUM 9.5 07/20/2020   ANIONGAP 12 04/17/2017   EGFR 60 07/20/2020   GFR 55.96 (L) 11/25/2019   Lab Results  Component Value Date   CHOL 177 06/21/2020   Lab Results  Component Value Date   HDL 52 06/21/2020   Lab Results  Component Value Date   LDLCALC 101 (H) 06/21/2020   Lab Results  Component Value Date   TRIG 137 06/21/2020   Lab Results  Component Value Date   CHOLHDL 3.4 06/21/2020   Lab Results  Component Value Date   HGBA1C 6.0 (H) 06/21/2020       Assessment & Plan:  1. Seasonal allergic rhinitis due to pollen - montelukast (SINGULAIR) 10 MG tablet; Take 1 tablet (10 mg total) by mouth at bedtime.  Dispense: 30 tablet; Refill: 5  2. Shortness of breath Pt has been tested for asthma by Dr. Nicki Reaper in the pat and also by pulmonology 1-2 yrs ago.   3. Pedal edema Continue lasix 20 mg once daily.  - Comprehensive metabolic panel   Meds ordered this encounter  Medications   montelukast (SINGULAIR) 10 MG tablet    Sig: Take 1 tablet (10 mg total) by mouth at bedtime.    Dispense:  30 tablet    Refill:  5    Orders Placed This Encounter  Procedures   Comprehensive metabolic panel    Follow-up: Return in about 8 weeks (around 09/27/2020).  An After Visit Summary  was printed and given to the patient.  Rochel Brome, MD [facility] [phone]

## 2020-08-17 ENCOUNTER — Telehealth: Payer: Self-pay | Admitting: Diagnostic Neuroimaging

## 2020-08-17 NOTE — Telephone Encounter (Signed)
LVM for pt to call us back to reschedule. Office will be closed on Friday. She can see Dr. Felecia Shelling on Thursday 8/4 in any held spot. If Thursday doesn't work for her, new patient referrals can find a new appt slot for her.

## 2020-08-19 ENCOUNTER — Ambulatory Visit (INDEPENDENT_AMBULATORY_CARE_PROVIDER_SITE_OTHER): Payer: Medicare Other | Admitting: Neurology

## 2020-08-19 ENCOUNTER — Other Ambulatory Visit: Payer: Self-pay

## 2020-08-19 ENCOUNTER — Encounter: Payer: Self-pay | Admitting: Neurology

## 2020-08-19 VITALS — BP 113/70 | HR 65 | Ht 62.0 in | Wt 212.0 lb

## 2020-08-19 DIAGNOSIS — R200 Anesthesia of skin: Secondary | ICD-10-CM | POA: Diagnosis not present

## 2020-08-19 DIAGNOSIS — M79601 Pain in right arm: Secondary | ICD-10-CM | POA: Diagnosis not present

## 2020-08-19 DIAGNOSIS — M5020 Other cervical disc displacement, unspecified cervical region: Secondary | ICD-10-CM

## 2020-08-19 DIAGNOSIS — M5030 Other cervical disc degeneration, unspecified cervical region: Secondary | ICD-10-CM

## 2020-08-19 NOTE — Progress Notes (Addendum)
[facility]  PATIENT: Diana Hendrix DOB: November 17, 1945  REFERRING DOCTOR OR PCP: Rochel Brome, MD SOURCE: Patient, notes from primary care  _________________________________   HISTORICAL  CHIEF COMPLAINT:  Chief Complaint  Patient presents with   New Patient (Initial Visit)    Rm 1, alone. Internal referral for cervical radiculopathy. Pt c/o of righ arm pn down to hands, since Aug. Pt states she has a partial torn biceps and bulging disc in neck. Pt c/o of L neck cramp causing pn in the back of head.  Pn goes away within a seconds. States it feels like a "catch".     HISTORY OF PRESENT ILLNESS:  I had the pleasure of seeing your patient, Diana Hendrix, at [facility] for neurologic consultation regarding her right arm pain.  She is a 75 year old who beganto experience right arm pain while doing a yoga class last year  Pain was mostly in the forearm. Into her wrist.    She denies numbness in her hand  She saw her PCP and was referred to Sports Medicine and had PT and dry needling.    An MRI of the arm showed a biceps tear in the arm and MRI off the cervical spine discdegenerative changes in her neck, worse at C6-C7 with a LEFT paramedian disc protrusion.   No nerve root compression.   She was placed on gabapentin but it did not help her much and she stopped.   Tylenol and ibuprofen have not helped much.   Symptoms have slowly improved but she is not at baseline.     She has a long history of some discomfort at times in her neck, worse with some positions of her arm (up and behind head)  Currently, pain is intermittent and milder.   It is located in the forearm form elbow to the wrist but not into the hand.   She denies numbness.   She notices mild weakness and uses her left arm more than the right to lift now.    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   Symptoms improved and she stopped wearing the braces.    Images reviewed  today: MRI of the cervical spine on 01/21/2020 showed a small left paramedian disc protrusion at C6-C7 that does not need to nerve root compression or spinal stenosis.  There are minimal disc bulges at C3-C4 and C4-C5.  No nerve root compression.  Mild foraminal narrowing at C4-C5.   REVIEW OF SYSTEMS: Constitutional: No fevers, chills, sweats, or change in appetite Eyes: No visual changes, double vision, eye pain Ear, nose and throat: No hearing loss, ear pain, nasal congestion, sore throat Cardiovascular: No chest pain, palpitations Respiratory:  No shortness of breath at rest or with exertion.   No wheezes GastrointestinaI: No nausea, vomiting, diarrhea, abdominal pain, fecal incontinence Genitourinary:  No dysuria, urinary retention or frequency.  No nocturia. Musculoskeletal: As above integumentary: No rash, pruritus, skin lesions Neurological: as above Psychiatric: No depression at this time.  No anxiety Endocrine: No palpitations, diaphoresis, change in appetite, change in weigh or increased thirst Hematologic/Lymphatic:  No anemia, purpura, petechiae. Allergic/Immunologic: No itchy/runny eyes, nasal congestion, recent allergic reactions, rashes  ALLERGIES: Allergies  Allergen Reactions   Ibuprofen Other (See Comments)    Pt states Dr doesn't wont her to take it; not allergic    HOME MEDICATIONS:  Current Outpatient Medications:    cetirizine (ZYRTEC) 10 MG tablet, Take 10 mg  by mouth daily., Disp: , Rfl:    Cholecalciferol (VITAMIN D3) 50 MCG (2000 UT) TABS, Take 1 tablet by mouth daily., Disp: , Rfl:    citalopram (CELEXA) 10 MG tablet, TAKE 1 TABLET(10 MG) BY MOUTH DAILY, Disp: 90 tablet, Rfl: 0   furosemide (LASIX) 20 MG tablet, TAKE 1 TABLET(20 MG) BY MOUTH DAILY, Disp: 90 tablet, Rfl: 0   LINZESS 290 MCG CAPS capsule, TAKE 1 CAPSULE(290 MCG) BY MOUTH DAILY BEFORE AND BREAKFAST, Disp: 90 capsule, Rfl: 1   metoprolol succinate (TOPROL-XL) 50 MG 24 hr tablet, Take 1.5  tablets (75 mg total) by mouth daily. Take with or immediately following a meal., Disp: 90 tablet, Rfl: 1   montelukast (SINGULAIR) 10 MG tablet, Take 1 tablet (10 mg total) by mouth at bedtime., Disp: 30 tablet, Rfl: 5   olmesartan-hydrochlorothiazide (BENICAR HCT) 40-12.5 MG tablet, TAKE 1 TABLET BY MOUTH DAILY, Disp: 90 tablet, Rfl: 1   pantoprazole (PROTONIX) 40 MG tablet, TAKE 1 TABLET BY MOUTH DAILY, Disp: 90 tablet, Rfl: 1   Polyethyl Glycol-Propyl Glycol (SYSTANE OP), Place 1 drop into both eyes 4 (four) times daily., Disp: , Rfl:    Probiotic Product (PROBIOTIC DAILY PO), Take 1 tablet by mouth daily., Disp: , Rfl:    simvastatin (ZOCOR) 20 MG tablet, TAKE 1 TABLET(20 MG) BY MOUTH AT BEDTIME, Disp: 90 tablet, Rfl: 0  PAST MEDICAL HISTORY: Past Medical History:  Diagnosis Date   Cancer ([place])    Diastolic heart failure (HCC)    Echo EF 60/65% Heart monitor was normal   GERD (gastroesophageal reflux disease)    History of colon polyps    Hyperlipidemia    Hypertension    Macular edema, cystoid    OSA (obstructive sleep apnea) 09/17/2012   Osteopenia     PAST SURGICAL HISTORY: Past Surgical History:  Procedure Laterality Date   CESAREAN SECTION     COLONOSCOPY  10/29/2013   Colonic polyp status post polypectomy. Mild sigmoid diverticulosis. Small internal hemorrhoids   FOOT NEUROMA SURGERY     s/p surgery in both feet   FOOT SURGERY Bilateral    Per patient   MENISCUS REPAIR Left 09/13/2016   miniscus repair right  Right 2019   SHOULDER SURGERY Left    arthroscopy.    TUBAL LIGATION      FAMILY HISTORY: Family History  Problem Relation Age of Onset   Hypertension Mother    Atrial fibrillation Mother    CAD Mother    Congestive Heart Failure Mother    CVA Father    Hypertension Brother    Hypertension Son    Colon cancer Neg Hx    Esophageal cancer Neg Hx     SOCIAL HISTORY:  Social History   Socioeconomic History   Marital status: Married    Spouse name:  Transport planner   Number of children: 2   Years of education: Not on file   Highest education level: Not on file  Occupational History   Not on file  Tobacco Use   Smoking status: Never   Smokeless tobacco: Never  Vaping Use   Vaping Use: Never used  Substance and Sexual Activity   Alcohol use: No   Drug use: No   Sexual activity: Not Currently  Other Topics Concern   Not on file  Social History Narrative   Lives with husband   Right handed   Caffeine: 1 cup of tea and 1 pepsi in the AM   Social Determinants of  Health   Financial Resource Strain: Not on file  Food Insecurity: Not on file  Transportation Needs: Not on file  Physical Activity: Not on file  Stress: Not on file  Social Connections: Not on file  Intimate Partner Violence: Not on file     PHYSICAL EXAM  Vitals:   08/19/20 1408  BP: 113/70  Pulse: 65  Weight: 212 lb (96.2 kg)  Height: '5\' 2"'$  (1.575 m)    Body mass index is 38.78 kg/m.   General: The patient is well-developed and well-nourished and in no acute distress  HEENT:  Head is [place]/AT.  Sclera are anicteric.    Neck: No carotid bruits are noted.  The neck is nontender.  Cardiovascular: The heart has a regular rate and rhythm with a normal S1 and S2. There were no murmurs, gallops or rubs.    Skin: Extremities are without rash or  edema.  Musculoskeletal:  Back is nontender  Neurologic Exam  Mental status: The patient is alert and oriented x 3 at the time of the examination. The patient has apparent normal recent and remote memory, with an apparently normal attention span and concentration ability.   Speech is normal.  Cranial nerves: Extraocular movements are full.  Facial strength and sensation was normal.. No obvious hearing deficits are noted.  Motor:  Muscle bulk is normal.   Tone is normal. Strength is  5 / 5 in all 4 extremities.   Sensory: Sensory testing is intact to pinprick, soft touch and vibration sensation in all 4  extremities.  Coordination: Cerebellar testing reveals good finger-nose-finger and heel-to-shin bilaterally.  Gait and station: Station is normal.   Gait is mildly arthritic.. Tandem gait is normal. Romberg is negative.   Reflexes: Deep tendon reflexes are symmetric and normal bilaterally.     DIAGNOSTIC DATA (LABS, IMAGING, TESTING) - I reviewed patient records, labs, notes, testing and imaging myself where available.  Lab Results  Component Value Date   WBC 8.3 07/20/2020   HGB 13.0 07/20/2020   HCT 38.9 07/20/2020   MCV 88 07/20/2020   PLT 288 07/20/2020      Component Value Date/Time   NA 132 (L) 08/02/2020 1133   K 4.4 08/02/2020 1133   CL 93 (L) 08/02/2020 1133   CO2 24 08/02/2020 1133   GLUCOSE 91 08/02/2020 1133   GLUCOSE 83 11/25/2019 1058   BUN 13 08/02/2020 1133   CREATININE 1.04 (H) 08/02/2020 1133   CALCIUM 9.7 08/02/2020 1133   PROT 6.9 08/02/2020 1133   ALBUMIN 4.4 08/02/2020 1133   AST 17 08/02/2020 1133   ALT 15 08/02/2020 1133   ALKPHOS 77 08/02/2020 1133   BILITOT 0.3 08/02/2020 1133   GFRNONAA 57 (L) 12/23/2019 1123   GFRAA 65 12/23/2019 1123   Lab Results  Component Value Date   CHOL 177 06/21/2020   HDL 52 06/21/2020   LDLCALC 101 (H) 06/21/2020   TRIG 137 06/21/2020   CHOLHDL 3.4 06/21/2020   Lab Results  Component Value Date   HGBA1C 6.0 (H) 06/21/2020   No results found for: [id number] Lab Results  Component Value Date   TSH 1.640 12/23/2019       ASSESSMENT AND PLAN  Right arm pain  Right arm numbness  Bulge of cervical disc without myelopathy   In summary, Ms. Wray is a 8 year old woman with a 1 year history of right arm pain who has mild degenerative changes noted on cervical spine MRI.  The etiology of her pain  is not clear.  Although the symptoms sound radicular, the MRI of the cervical spine did not show any nerve root compression.  It is possible that some of her symptoms could be due to carpal tunnel syndrome.   However, strength in the median innervated hand muscles was normal and she did not have any numbness so this would be unlikely to be severe enough to also cause forearm pain.  We discussed 2 options.  We could check an NCV/EMG study to determine if there is any mononeuropathy that might explain her symptoms.  Alternatively, because she is improving, we could just continue to observe.  As she is better she would prefer to hold off on the NCV/EMG but would reconsider if symptoms worsen.  She will continue to take Tylenol as needed if pain flares up.  She will return to see me as needed if she has new or worsening neurologic symptoms.    Keiasia Christianson A. Felecia Shelling, MD, [facility] [id number], [id number] PM Certified in Neurology, Clinical Neurophysiology, Sleep Medicine and Neuroimaging  [facility] [address], [place], [place] [address] [phone]

## 2020-08-20 ENCOUNTER — Ambulatory Visit: Payer: Medicare Other | Admitting: Diagnostic Neuroimaging

## 2020-08-26 ENCOUNTER — Other Ambulatory Visit: Payer: Self-pay | Admitting: Family Medicine

## 2020-08-26 DIAGNOSIS — R0602 Shortness of breath: Secondary | ICD-10-CM

## 2020-08-26 DIAGNOSIS — R600 Localized edema: Secondary | ICD-10-CM

## 2020-09-27 ENCOUNTER — Ambulatory Visit (INDEPENDENT_AMBULATORY_CARE_PROVIDER_SITE_OTHER): Payer: Medicare Other | Admitting: Family Medicine

## 2020-09-27 ENCOUNTER — Encounter: Payer: Self-pay | Admitting: Family Medicine

## 2020-09-27 VITALS — BP 110/60 | HR 56 | Temp 97.2°F | Resp 16 | Ht 62.0 in | Wt 211.0 lb

## 2020-09-27 DIAGNOSIS — E782 Mixed hyperlipidemia: Secondary | ICD-10-CM

## 2020-09-27 DIAGNOSIS — I10 Essential (primary) hypertension: Secondary | ICD-10-CM

## 2020-09-27 DIAGNOSIS — R7301 Impaired fasting glucose: Secondary | ICD-10-CM

## 2020-09-27 DIAGNOSIS — J028 Acute pharyngitis due to other specified organisms: Secondary | ICD-10-CM | POA: Diagnosis not present

## 2020-09-27 DIAGNOSIS — G4733 Obstructive sleep apnea (adult) (pediatric): Secondary | ICD-10-CM

## 2020-09-27 DIAGNOSIS — Z23 Encounter for immunization: Secondary | ICD-10-CM

## 2020-09-27 DIAGNOSIS — K5904 Chronic idiopathic constipation: Secondary | ICD-10-CM

## 2020-09-27 DIAGNOSIS — K219 Gastro-esophageal reflux disease without esophagitis: Secondary | ICD-10-CM

## 2020-09-27 MED ORDER — TRULANCE 3 MG PO TABS: 3.0000 mg | ORAL_TABLET | Freq: Every day | ORAL | 0 refills | Status: DC

## 2020-09-27 NOTE — Assessment & Plan Note (Signed)
Recommend continue to work on eating healthy diet and exercise.  

## 2020-09-27 NOTE — Assessment & Plan Note (Signed)
The current medical regimen is effective;  continue present plan and medications.  

## 2020-09-27 NOTE — Progress Notes (Signed)
0o  Subjective:  Patient ID: Diana Hendrix, female    DOB: 1945-03-19  Age: 75 y.o. MRN: [id number]  Chief Complaint  Patient presents with   Hypertension   Hyperlipidemia   HPI Hypertension: on metoprolol xl 50  mg 1.5 tablets daily, olmesartan HCT 40-12.5 mg daily, lasix 20 mg 3 times a week.  Hyperlipidemia: on zocor 20 mg once daily.  GERD: protonix for reflux which works well.  Depression/Anxiety: on celexa 10 mg once daily. Works [employer], manufacturing. Pt has been on for years. Constipation (CIC) Previously took [place], but caused diarrhea. Then went on just miralax and it worked for years, but did make her gassy.  Started on Linzess 290 mg once daily, miralax. Was still very gassy. Now on Senna S, colace, and linzess. This combination helps most of the time.   Current Outpatient Medications on File Prior to Visit  Medication Sig Dispense Refill   cetirizine (ZYRTEC) 10 MG tablet Take 10 mg by mouth daily.     Cholecalciferol (VITAMIN D3) 50 MCG (2000 UT) TABS Take 1 tablet by mouth daily.     citalopram (CELEXA) 10 MG tablet TAKE 1 TABLET(10 MG) BY MOUTH DAILY 90 tablet 0   furosemide (LASIX) 20 MG tablet TAKE 1 TABLET(20 MG) BY MOUTH DAILY (Patient taking differently: Take 20 mg by mouth daily. Taking 3 times weekly. [place], [place]. And Friday.) 90 tablet 1   metoprolol succinate (TOPROL-XL) 50 MG 24 hr tablet Take 1.5 tablets (75 mg total) by mouth daily. Take with or immediately following a meal. 90 tablet 1   montelukast (SINGULAIR) 10 MG tablet Take 1 tablet (10 mg total) by mouth at bedtime. 30 tablet 5   olmesartan-hydrochlorothiazide (BENICAR HCT) 40-12.5 MG tablet TAKE 1 TABLET BY MOUTH DAILY 90 tablet 1   pantoprazole (PROTONIX) 40 MG tablet TAKE 1 TABLET BY MOUTH DAILY 90 tablet 1   Polyethyl Glycol-Propyl Glycol (SYSTANE OP) Place 1 drop into both eyes 4 (four) times daily.     No current facility-administered medications on file prior to visit.   Past Medical History:  Diagnosis Date    Cancer ([place])    Diastolic heart failure (HCC)    Echo EF 60/65% Heart monitor was normal   GERD (gastroesophageal reflux disease)    History of colon polyps    Hyperlipidemia    Hypertension    Macular edema, cystoid    OSA (obstructive sleep apnea) 09/17/2012   Osteopenia    Plantar fasciitis 09/09/2019   Past Surgical History:  Procedure Laterality Date   CESAREAN SECTION     COLONOSCOPY  10/29/2013   Colonic polyp status post polypectomy. Mild sigmoid diverticulosis. Small internal hemorrhoids   FOOT NEUROMA SURGERY     s/p surgery in both feet   FOOT SURGERY Bilateral    Per patient   MENISCUS REPAIR Left 09/13/2016   miniscus repair right  Right 2019   SHOULDER SURGERY Left    arthroscopy.    TUBAL LIGATION      Family History  Problem Relation Age of Onset   Hypertension Mother    Atrial fibrillation Mother    CAD Mother    Congestive Heart Failure Mother    CVA Father    Hypertension Brother    Hypertension Son    Colon cancer Neg Hx    Esophageal cancer Neg Hx    Social History   Socioeconomic History   Marital status: Married    Spouse name: Babli Derring   Number of  children: 2   Years of education: Not on file   Highest education level: Not on file  Occupational History   Not on file  Tobacco Use   Smoking status: Never   Smokeless tobacco: Never  Vaping Use   Vaping Use: Never used  Substance and Sexual Activity   Alcohol use: No   Drug use: No   Sexual activity: Not Currently  Other Topics Concern   Not on file  Social History Narrative   Lives with husband   Right handed   Caffeine: 1 cup of tea and 1 pepsi in the AM   Social Determinants of Health   Financial Resource Strain: Not on file  Food Insecurity: Not on file  Transportation Needs: Not on file  Physical Activity: Not on file  Stress: Not on file  Social Connections: Not on file    Review of Systems  Constitutional:  Negative for chills, fatigue and fever.  HENT:   Positive for sore throat. Negative for congestion and rhinorrhea.   Respiratory:  Negative for cough and shortness of breath.   Cardiovascular:  Negative for chest pain.  Gastrointestinal:  Positive for constipation (linzess and miralax. Miralax had been causing her to have terrible gas. so discontinue miralax and started on senna S, colace, and continued linzess.). Negative for abdominal pain, diarrhea, nausea and vomiting.  Genitourinary:  Negative for dysuria and urgency.  Musculoskeletal:  Positive for back pain. Negative for myalgias.  Neurological:  Negative for dizziness, weakness, light-headedness and headaches.  Psychiatric/Behavioral:  Negative for dysphoric mood. The patient is not nervous/anxious.     Objective:  BP 110/60   Pulse (!) 56   Temp (!) 97.2 F (36.2 C)   Resp 16   Ht '5\' 2"'$  (1.575 m)   Wt 211 lb (95.7 kg)   BMI 38.59 kg/m   BP/Weight 09/27/2020 08/19/2020 [id number]  Systolic BP [id number]  Diastolic BP 60 70 60  Wt. (Lbs) 211 212 210  BMI 38.59 38.78 38.41    Physical Exam Vitals reviewed.  Constitutional:      Appearance: Normal appearance. She is normal weight.  HENT:     Right Ear: Tympanic membrane, ear canal and external ear normal.     Left Ear: Tympanic membrane, ear canal and external ear normal.     Nose: Rhinorrhea present.     Mouth/Throat:     Pharynx: Posterior oropharyngeal erythema present. No oropharyngeal exudate.  Neck:     Vascular: No carotid bruit.  Cardiovascular:     Rate and Rhythm: Normal rate and regular rhythm.     Pulses: Normal pulses.     Heart sounds: Normal heart sounds. No murmur heard. Pulmonary:     Effort: Pulmonary effort is normal. No respiratory distress.     Breath sounds: Normal breath sounds.  Abdominal:     General: Abdomen is flat. Bowel sounds are normal.     Palpations: Abdomen is soft.     Tenderness: There is no abdominal tenderness.  Lymphadenopathy:     Cervical: No cervical adenopathy.   Neurological:     Mental Status: She is alert and oriented to person, place, and time.  Psychiatric:        Mood and Affect: Mood normal.        Behavior: Behavior normal.    Diabetic Foot Exam - Simple   No data filed      Lab Results  Component Value Date   WBC 6.6 09/27/2020  HGB 13.2 09/27/2020   HCT 39.2 09/27/2020   PLT 300 09/27/2020   GLUCOSE 98 09/27/2020   CHOL 193 09/27/2020   TRIG 149 09/27/2020   HDL 49 09/27/2020   LDLCALC 118 (H) 09/27/2020   ALT 17 09/27/2020   AST 20 09/27/2020   NA 135 09/27/2020   K 4.1 09/27/2020   CL 97 09/27/2020   CREATININE 1.05 (H) 09/27/2020   BUN 15 09/27/2020   CO2 23 09/27/2020   TSH 1.280 09/27/2020   HGBA1C 6.1 (H) 09/27/2020      Assessment & Plan:   Problem List Items Addressed This Visit       Cardiovascular and Mediastinum   Benign essential hypertension - Primary    Well controlled.  No changes to medicines.  Continue to work on eating a healthy diet and exercise.  Labs drawn today.        Relevant Orders   CBC with Differential/Platelet (Completed)   Comprehensive metabolic panel (Completed)     Respiratory   OSA (obstructive sleep apnea)    Continue cpap.  Management per specialist.        Acute pharyngitis due to other specified organisms   Relevant Orders   POCT rapid strep A (Completed)     Digestive   GERD (gastroesophageal reflux disease)    The current medical regimen is effective;  continue present plan and medications.       Relevant Medications   Plecanatide (TRULANCE) 3 MG TABS   Chronic idiopathic constipation    Discontinue linzess.  Start on trulance. Continue senna and colace. Try to cut out if trulance results in diarrhea.       Relevant Medications   Plecanatide (TRULANCE) 3 MG TABS   Other Relevant Orders   TSH (Completed)     Endocrine   Impaired fasting blood sugar    Recommend continue to work on eating healthy diet and exercise.       Relevant  Orders   Hemoglobin A1c (Completed)     Other   Hyperlipidemia   Relevant Orders   Lipid panel (Completed)   Morbid obesity ([place])    Refer to healthy weight and wellness      Other Visit Diagnoses     Need for influenza vaccination       Relevant Orders   Flu Vaccine QUAD High Dose(Fluad) (Completed)     .  Meds ordered this encounter  Medications   Plecanatide (TRULANCE) 3 MG TABS    Sig: Take 3 mg by mouth daily.    Dispense:  12 tablet    Refill:  0     Orders Placed This Encounter  Procedures   Flu Vaccine QUAD High Dose(Fluad)   CBC with Differential/Platelet   Hemoglobin A1c   Comprehensive metabolic panel   Lipid panel   TSH   POCT rapid strep A     Follow-up: Return in about 3 months (around 12/27/2020) for chronic fasting.  An After Visit Summary was printed and given to the patient.  Rochel Brome, MD [facility] [phone]

## 2020-09-27 NOTE — Assessment & Plan Note (Signed)
>>  ASSESSMENT AND PLAN FOR IMPAIRED FASTING BLOOD SUGAR WRITTEN ON 09/27/2020  9:37 AM BY COX, KIRSTEN, MD  Recommend continue to work on eating healthy diet and exercise.

## 2020-09-27 NOTE — Assessment & Plan Note (Signed)
Continue cpap.  Management per specialist.

## 2020-09-27 NOTE — Assessment & Plan Note (Signed)
Discontinue linzess.  Start on trulance. Continue senna and colace. Try to cut out if trulance results in diarrhea.

## 2020-09-27 NOTE — Assessment & Plan Note (Signed)
Well controlled.  ?No changes to medicines.  ?Continue to work on eating a healthy diet and exercise.  ?Labs drawn today.  ?

## 2020-09-27 NOTE — Patient Instructions (Signed)
Discontinue linzess.  Start on trulance. Continue senna and colace. Try to cut out if trulance results in diarrhea.

## 2020-09-27 NOTE — Assessment & Plan Note (Signed)
-   Refer to healthy weight and wellness 

## 2020-09-28 LAB — CBC WITH DIFFERENTIAL/PLATELET
Basophils Absolute: 0.1 10*3/uL (ref 0.0–0.2)
Basos: 1 %
EOS (ABSOLUTE): 0.2 10*3/uL (ref 0.0–0.4)
Eos: 2 %
Hematocrit: 39.2 % (ref 34.0–46.6)
Hemoglobin: 13.2 g/dL (ref 11.1–15.9)
Immature Grans (Abs): 0 10*3/uL (ref 0.0–0.1)
Immature Granulocytes: 0 %
Lymphocytes Absolute: 1.6 10*3/uL (ref 0.7–3.1)
Lymphs: 24 %
MCH: 29.7 pg (ref 26.6–33.0)
MCHC: 33.7 g/dL (ref 31.5–35.7)
MCV: 88 fL (ref 79–97)
Monocytes Absolute: 0.5 10*3/uL (ref 0.1–0.9)
Monocytes: 8 %
Neutrophils Absolute: 4.3 10*3/uL (ref 1.4–7.0)
Neutrophils: 65 %
Platelets: 300 10*3/uL (ref 150–450)
RBC: 4.45 x10E6/uL (ref 3.77–5.28)
RDW: 12.5 % (ref 11.7–15.4)
WBC: 6.6 10*3/uL (ref 3.4–10.8)

## 2020-09-28 LAB — COMPREHENSIVE METABOLIC PANEL
ALT: 17 IU/L (ref 0–32)
AST: 20 IU/L (ref 0–40)
Albumin/Globulin Ratio: 1.9 (ref 1.2–2.2)
Albumin: 4.6 g/dL (ref 3.7–4.7)
Alkaline Phosphatase: 76 IU/L (ref 44–121)
BUN/Creatinine Ratio: 14 (ref 12–28)
BUN: 15 mg/dL (ref 8–27)
Bilirubin Total: 0.6 mg/dL (ref 0.0–1.2)
CO2: 23 mmol/L (ref 20–29)
Calcium: 9.7 mg/dL (ref 8.7–10.3)
Chloride: 97 mmol/L (ref 96–106)
Creatinine, Ser: 1.05 mg/dL — ABNORMAL HIGH (ref 0.57–1.00)
Globulin, Total: 2.4 g/dL (ref 1.5–4.5)
Glucose: 98 mg/dL (ref 65–99)
Potassium: 4.1 mmol/L (ref 3.5–5.2)
Sodium: 135 mmol/L (ref 134–144)
Total Protein: 7 g/dL (ref 6.0–8.5)
eGFR: 56 mL/min/{1.73_m2} — ABNORMAL LOW (ref >59)

## 2020-09-28 LAB — HEMOGLOBIN A1C
Est. average glucose Bld gHb Est-mCnc: 128 mg/dL
Hgb A1c MFr Bld: 6.1 % — ABNORMAL HIGH (ref 4.8–5.6)

## 2020-09-28 LAB — LIPID PANEL
Chol/HDL Ratio: 3.9 ratio (ref 0.0–4.4)
Cholesterol, Total: 193 mg/dL (ref 100–199)
HDL: 49 mg/dL (ref >39)
LDL Chol Calc (NIH): 118 mg/dL — ABNORMAL HIGH (ref 0–99)
Triglycerides: 149 mg/dL (ref 0–149)
VLDL Cholesterol Cal: 26 mg/dL (ref 5–40)

## 2020-09-28 LAB — TSH: TSH: 1.28 u[IU]/mL (ref 0.450–4.500)

## 2020-09-29 ENCOUNTER — Other Ambulatory Visit: Payer: Self-pay

## 2020-09-29 MED ORDER — SIMVASTATIN 40 MG PO TABS: ORAL_TABLET | ORAL | 0 refills | Status: DC

## 2020-10-03 DIAGNOSIS — J028 Acute pharyngitis due to other specified organisms: Secondary | ICD-10-CM | POA: Insufficient documentation

## 2020-10-03 LAB — POCT RAPID STREP A (OFFICE): Rapid Strep A Screen: NEGATIVE

## 2020-10-04 ENCOUNTER — Ambulatory Visit (INDEPENDENT_AMBULATORY_CARE_PROVIDER_SITE_OTHER): Payer: Medicare Other | Admitting: Pulmonary Disease

## 2020-10-04 ENCOUNTER — Other Ambulatory Visit: Payer: Self-pay

## 2020-10-04 ENCOUNTER — Encounter: Payer: Self-pay | Admitting: Pulmonary Disease

## 2020-10-04 VITALS — BP 114/70 | HR 68 | Temp 97.7°F | Ht 62.0 in | Wt 209.6 lb

## 2020-10-04 DIAGNOSIS — J4530 Mild persistent asthma, uncomplicated: Secondary | ICD-10-CM | POA: Diagnosis not present

## 2020-10-04 DIAGNOSIS — K5904 Chronic idiopathic constipation: Secondary | ICD-10-CM

## 2020-10-04 DIAGNOSIS — E669 Obesity, unspecified: Secondary | ICD-10-CM

## 2020-10-04 DIAGNOSIS — R058 Other specified cough: Secondary | ICD-10-CM

## 2020-10-04 DIAGNOSIS — Z9989 Dependence on other enabling machines and devices: Secondary | ICD-10-CM | POA: Diagnosis not present

## 2020-10-04 DIAGNOSIS — G4730 Sleep apnea, unspecified: Secondary | ICD-10-CM

## 2020-10-04 DIAGNOSIS — G4733 Obstructive sleep apnea (adult) (pediatric): Secondary | ICD-10-CM

## 2020-10-04 MED ORDER — TRULANCE 3 MG PO TABS: 3.0000 mg | ORAL_TABLET | Freq: Every day | ORAL | 0 refills | Status: DC

## 2020-10-04 NOTE — Patient Instructions (Signed)
You can try using flonase and astepro nasal sprays  Follow up in 1 year

## 2020-10-04 NOTE — Telephone Encounter (Signed)
Pt called states trulance samples are working. Requesting 30 day supply script sent to pharmacy.   Diana Hendrix, [place] 10/04/20 2:50 PM

## 2020-10-04 NOTE — Progress Notes (Signed)
[facility]  Chief Complaint  Patient presents with   Follow-up    Doing well other than random allergies occurrences     Constitutional:  BP 114/70 (BP Location: Left Arm, Cuff Size: Normal)   Pulse 68   Temp 97.7 F (36.5 C) (Oral)   Ht '5\' 2"'$  (1.575 m)   Wt 209 lb 9.6 oz (95.1 kg)   SpO2 95%   BMI 38.34 kg/m   Past Medical History:  HTN, HLD, GERD, Macular degeneration, Colon polyps, Osteopenia  Past Surgical History:  She  has a past surgical history that includes Cesarean section; Tubal ligation; Shoulder surgery (Left); Meniscus repair (Left, 09/13/2016); Foot neuroma surgery; Colonoscopy (10/29/2013); Foot surgery (Bilateral); and miniscus repair right  (Right, 2019).  Brief Summary:  Diana Hendrix is a 75 y.o. female with obstructive sleep apnea.      Subjective:   She has more sinus congestion and drainage over the Summer.  Had a cold last week, but improving now.  Was having chest tightness and shortness of breath.  Started on singulair and feels better.    Uses CPAP nightly.  Feels pressure range is better now, and not getting dryness like before.  Got her flu shot earlier this month.  Physical Exam:   Appearance - well kempt   ENMT - no sinus tenderness, no oral exudate, no LAN, Mallampati 3 airway, no stridor, mild erythema posterior pharynx.  Respiratory - equal breath sounds bilaterally, no wheezing or rales  CV - s1s2 regular rate and rhythm, no murmurs  Ext - no clubbing, no edema  Skin - no rashes  Psych - normal mood and affect   Pulmonary testing:  PFT 08/07/19 >> FEV1 2.20 (108%), FEV1% 85, TLC 4.34 (89%), DLCO 88%  Chest Imaging:    Sleep Tests:  PSG 09/17/12 >> RDI 13.7, SpO2 low 90%, PLMI 0, occasional PVC's and PAC's. Auto CPAP 09/01/20 to 09/30/20 >> used on 30 of 30 nights with average 9 hrs 8 min.  Average AHI 1.3 with median CPAP 7 and 95 th percentile CPAP 9 cm H2O  Cardiac Tests:   Echo 04/30/19 >> EF 60 to 65%, grade 2 DD, mild AR  Social History:  She  reports that she has never smoked. She has never used smokeless tobacco. She reports that she does not drink alcohol and does not use drugs.  Family History:  Her family history includes Atrial fibrillation in her mother; CAD in her mother; CVA in her father; Congestive Heart Failure in her mother; Hypertension in her brother, mother, and son.     Assessment/Plan:   Obstructive sleep apnea. - she is compliant with therapy and reports benefit - she uses [employer] for her DME - continue auto CPAP 5 to 9 cm H2O  Upper airway cough syndrome. - from allergic rhinitis - advised her to use nasal irrigation - continue zyrtec and singulair - she can add flonase and azelastine  Mild, intermittent allergic asthma. - improved with singulair - if symptoms progress, then might need additional assessment and inhaler therapy   Obesity. - discussed importance of weight loss  Time Spent Involved in Patient Care on Day of Examination:  32 minutes  Follow up:   Patient Instructions  You can try using flonase and astepro nasal sprays  Follow up in 1 year  Medication List:   Allergies as of 10/04/2020       Reactions   Ibuprofen Other (See Comments)  Pt states Dr doesn't wont her to take it; not allergic        Medication List        Accurate as of October 04, 2020 10:21 AM. If you have any questions, ask your nurse or doctor.          cetirizine 10 MG tablet Commonly known as: ZYRTEC Take 10 mg by mouth daily.   citalopram 10 MG tablet Commonly known as: CELEXA TAKE 1 TABLET(10 MG) BY MOUTH DAILY   furosemide 20 MG tablet Commonly known as: LASIX TAKE 1 TABLET(20 MG) BY MOUTH DAILY What changed: See the new instructions.   metoprolol succinate 50 MG 24 hr tablet Commonly known as: TOPROL-XL Take 1.5 tablets (75 mg total) by mouth daily. Take with or immediately following a meal.    montelukast 10 MG tablet Commonly known as: SINGULAIR Take 1 tablet (10 mg total) by mouth at bedtime.   olmesartan-hydrochlorothiazide 40-12.5 MG tablet Commonly known as: BENICAR HCT TAKE 1 TABLET BY MOUTH DAILY   pantoprazole 40 MG tablet Commonly known as: PROTONIX TAKE 1 TABLET BY MOUTH DAILY   simvastatin 40 MG tablet Commonly known as: ZOCOR Take 1 tablet by mouth daily at bedtime   SYSTANE OP Place 1 drop into both eyes 4 (four) times daily.   Trulance 3 MG Tabs Generic drug: Plecanatide Take 3 mg by mouth daily.   Vitamin D3 50 MCG (2000 UT) Tabs Take 1 tablet by mouth daily.        Signature:  Chesley Mires, MD [place] Pager - [phone] 10/04/2020, 10:21 AM

## 2020-10-20 ENCOUNTER — Encounter: Payer: Self-pay | Admitting: Family Medicine

## 2020-10-20 ENCOUNTER — Telehealth: Payer: Self-pay | Admitting: Pulmonary Disease

## 2020-10-20 ENCOUNTER — Other Ambulatory Visit: Payer: Self-pay

## 2020-10-20 ENCOUNTER — Ambulatory Visit (INDEPENDENT_AMBULATORY_CARE_PROVIDER_SITE_OTHER): Payer: Medicare Other | Admitting: Family Medicine

## 2020-10-20 VITALS — BP 110/70 | HR 60 | Temp 97.2°F | Resp 16 | Ht 62.0 in | Wt 209.0 lb

## 2020-10-20 DIAGNOSIS — S39012A Strain of muscle, fascia and tendon of lower back, initial encounter: Secondary | ICD-10-CM | POA: Diagnosis not present

## 2020-10-20 DIAGNOSIS — G4733 Obstructive sleep apnea (adult) (pediatric): Secondary | ICD-10-CM

## 2020-10-20 LAB — POCT URINALYSIS DIP (CLINITEK)
Bilirubin, UA: NEGATIVE
Blood, UA: NEGATIVE
Glucose, UA: NEGATIVE mg/dL
Ketones, POC UA: NEGATIVE mg/dL
Leukocytes, UA: NEGATIVE
Nitrite, UA: NEGATIVE
POC PROTEIN,UA: NEGATIVE
Spec Grav, UA: 1.015 (ref 1.010–1.025)
Urobilinogen, UA: 0.2 E.U./dL
pH, UA: 6 (ref 5.0–8.0)

## 2020-10-20 MED ORDER — MELOXICAM 7.5 MG PO TABS: 7.5000 mg | ORAL_TABLET | Freq: Every day | ORAL | 0 refills | Status: DC

## 2020-10-20 MED ORDER — TIZANIDINE HCL 2 MG PO TABS: 2.0000 mg | ORAL_TABLET | Freq: Four times a day (QID) | ORAL | 0 refills | Status: DC | PRN

## 2020-10-20 NOTE — Progress Notes (Signed)
Acute Office Visit  Subjective:    Patient ID: Diana Hendrix, female    DOB: [birth year], 75 y.o.   MRN: [id number]  Chief Complaint  Patient presents with   Back Pain    HPI Patient is in today for back pain. Started one month ago. Certain movements will trigger it and it it will come back. Last night moved funny in bed. No radiculopathy. Tylenol no help. UA normal.   Patient was given Trulance at her last appointment for constipation.  This did not significantly help.  The patient ended up adding Colace and senna without improvement.  She then fully changed back to only MiraLAX 1 dose a day and this seems to be helping her constipation Past Medical History:  Diagnosis Date   Cancer ([place])    Diastolic heart failure ([place])    Echo EF 60/65% Heart monitor was normal   GERD (gastroesophageal reflux disease)    History of colon polyps    Hyperlipidemia    Hypertension    Macular edema, cystoid    OSA (obstructive sleep apnea) 09/17/2012   Osteopenia    Plantar fasciitis 09/09/2019    Past Surgical History:  Procedure Laterality Date   CESAREAN SECTION     COLONOSCOPY  10/29/2013   Colonic polyp status post polypectomy. Mild sigmoid diverticulosis. Small internal hemorrhoids   FOOT NEUROMA SURGERY     s/p surgery in both feet   FOOT SURGERY Bilateral    Per patient   MENISCUS REPAIR Left 09/13/2016   miniscus repair right  Right 2019   SHOULDER SURGERY Left    arthroscopy.    TUBAL LIGATION      Family History  Problem Relation Age of Onset   Hypertension Mother    Atrial fibrillation Mother    CAD Mother    Congestive Heart Failure Mother    CVA Father    Hypertension Brother    Hypertension Son    Colon cancer Neg Hx    Esophageal cancer Neg Hx     Social History   Socioeconomic History   Marital status: Married    Spouse name: Transport planner   Number of children: 2   Years of education: Not on file   Highest education level: Not on file  Occupational  History   Not on file  Tobacco Use   Smoking status: Never   Smokeless tobacco: Never  Vaping Use   Vaping Use: Never used  Substance and Sexual Activity   Alcohol use: No   Drug use: No   Sexual activity: Not Currently  Other Topics Concern   Not on file  Social History Narrative   Lives with husband   Right handed   Caffeine: 1 cup of tea and 1 pepsi in the AM   Social Determinants of Health   Financial Resource Strain: Not on file  Food Insecurity: Not on file  Transportation Needs: Not on file  Physical Activity: Not on file  Stress: Not on file  Social Connections: Not on file  Intimate Partner Violence: Not on file    Outpatient Medications Prior to Visit  Medication Sig Dispense Refill   cetirizine (ZYRTEC) 10 MG tablet Take 10 mg by mouth daily.     Cholecalciferol (VITAMIN D3) 50 MCG (2000 UT) TABS Take 1 tablet by mouth daily.     citalopram (CELEXA) 10 MG tablet TAKE 1 TABLET(10 MG) BY MOUTH DAILY 90 tablet 0   furosemide (LASIX) 20 MG tablet TAKE 1  TABLET(20 MG) BY MOUTH DAILY (Patient taking differently: Take 20 mg by mouth daily. Taking 3 times weekly. [place], [place]. And Friday.) 90 tablet 1   metoprolol succinate (TOPROL-XL) 50 MG 24 hr tablet Take 1.5 tablets (75 mg total) by mouth daily. Take with or immediately following a meal. 90 tablet 1   montelukast (SINGULAIR) 10 MG tablet Take 1 tablet (10 mg total) by mouth at bedtime. 30 tablet 5   olmesartan-hydrochlorothiazide (BENICAR HCT) 40-12.5 MG tablet TAKE 1 TABLET BY MOUTH DAILY 90 tablet 1   pantoprazole (PROTONIX) 40 MG tablet TAKE 1 TABLET BY MOUTH DAILY 90 tablet 1   Plecanatide (TRULANCE) 3 MG TABS Take 3 mg by mouth daily. 30 tablet 0   Polyethyl Glycol-Propyl Glycol (SYSTANE OP) Place 1 drop into both eyes 4 (four) times daily.     simvastatin (ZOCOR) 40 MG tablet Take 1 tablet by mouth daily at bedtime 90 tablet 0   No facility-administered medications prior to visit.    No Active  Allergies   Review of Systems  Constitutional:  Negative for fatigue and fever.  Respiratory:  Negative for cough and shortness of breath.   Cardiovascular:  Negative for chest pain and palpitations.  Genitourinary:  Negative for dysuria.  Musculoskeletal:  Positive for back pain and myalgias.      Objective:    Physical Exam  BP 110/70   Pulse 60   Temp (!) 97.2 F (36.2 C)   Resp 16   Ht '5\' 2"'  (1.575 m)   Wt 209 lb (94.8 kg)   BMI 38.23 kg/m  Wt Readings from Last 3 Encounters:  10/20/20 209 lb (94.8 kg)  10/04/20 209 lb 9.6 oz (95.1 kg)  09/27/20 211 lb (95.7 kg)    Health Maintenance Due  Topic Date Due   Zoster Vaccines- Shingrix (1 of 2) Never done    There are no preventive care reminders to display for this patient.   Lab Results  Component Value Date   TSH 1.280 09/27/2020   Lab Results  Component Value Date   WBC 6.6 09/27/2020   HGB 13.2 09/27/2020   HCT 39.2 09/27/2020   MCV 88 09/27/2020   PLT 300 09/27/2020   Lab Results  Component Value Date   NA 135 09/27/2020   K 4.1 09/27/2020   CO2 23 09/27/2020   GLUCOSE 98 09/27/2020   BUN 15 09/27/2020   CREATININE 1.05 (H) 09/27/2020   BILITOT 0.6 09/27/2020   ALKPHOS 76 09/27/2020   AST 20 09/27/2020   ALT 17 09/27/2020   PROT 7.0 09/27/2020   ALBUMIN 4.6 09/27/2020   CALCIUM 9.7 09/27/2020   ANIONGAP 12 04/17/2017   EGFR 56 (L) 09/27/2020   GFR 55.96 (L) 11/25/2019   Lab Results  Component Value Date   CHOL 193 09/27/2020   Lab Results  Component Value Date   HDL 49 09/27/2020   Lab Results  Component Value Date   LDLCALC 118 (H) 09/27/2020   Lab Results  Component Value Date   TRIG 149 09/27/2020   Lab Results  Component Value Date   CHOLHDL 3.9 09/27/2020   Lab Results  Component Value Date   HGBA1C 6.1 (H) 09/27/2020       Assessment & Plan:   Problem List Items Addressed This Visit       Musculoskeletal and Integument   Strain of lumbar region - Primary     Plan of rest, intermittent application of cold packs (later, may switch to  heat, but do not sleep on heating pad), analgesics (tylenol) and muscle relaxants (tizanidine.) Trial on NSAID (meloxicam 7.5 mg once daily x 2 weeks. ) Discussed longer term treatment plan of prn NSAID's and home back care exercise program with flexion exercise routine.  Proper lifting mechanics with avoidance of heavy lifting discussed.  Consider Physical Therapy and XRay studies if not improving.  Call or return to clinic prn if these symptoms worsen or fail to improve as anticipated.  Return immediately if worsening.        Relevant Orders   POCT URINALYSIS DIP (CLINITEK) (Completed)   Meds ordered this encounter  Medications   meloxicam (MOBIC) 7.5 MG tablet    Sig: Take 1 tablet (7.5 mg total) by mouth daily.    Dispense:  30 tablet    Refill:  0   tiZANidine (ZANAFLEX) 2 MG tablet    Sig: Take 1 tablet (2 mg total) by mouth every 6 (six) hours as needed for muscle spasms.    Dispense:  40 tablet    Refill:  0    Orders Placed This Encounter  Procedures   POCT URINALYSIS DIP (CLINITEK)     Follow-up: Return if symptoms worsen or fail to improve.  An After Visit Summary was printed and given to the patient.  Rochel Brome, MD [facility] [phone]

## 2020-10-20 NOTE — Assessment & Plan Note (Signed)
Plan of rest, intermittent application of cold packs (later, may switch to heat, but do not sleep on heating pad), analgesics (tylenol) and muscle relaxants (tizanidine.) Trial on NSAID (meloxicam 7.5 mg once daily x 2 weeks. ) Discussed longer term treatment plan of prn NSAID's and home back care exercise program with flexion exercise routine.  Proper lifting mechanics with avoidance of heavy lifting discussed.  Consider Physical Therapy and XRay studies if not improving.  Call or return to clinic prn if these symptoms worsen or fail to improve as anticipated.  Return immediately if worsening.

## 2020-10-20 NOTE — Patient Instructions (Signed)
Lumbar strain.   Plan of rest, intermittent application of cold packs (later, may switch to heat, but do not sleep on heating pad), analgesics (tylenol) and muscle relaxants (tizanidine.) Trial on NSAID (meloxicam 7.5 mg once daily x 2 weeks. ) Discussed longer term treatment plan of prn NSAID's and home back care exercise program with flexion exercise routine.  Proper lifting mechanics with avoidance of heavy lifting discussed.  Consider Physical Therapy and XRay studies if not improving.  Call or return to clinic prn if these symptoms worsen or fail to improve as anticipated.  Return immediately if worsening.    Lumbar Strain A lumbar strain, which is sometimes called a low-back strain, is a stretch or tear in a muscle or the strong cords of tissue that attach muscle to bone (tendons) in the lower back (lumbar spine). This type of injury occurs when muscles or tendons are torn or are stretched beyond their limits. Lumbar strains can range from mild to severe. Mild strains may involve stretching a muscle or tendon without tearing it. These may heal in 1-2 weeks. More severe strains involve tearing of muscle fibers or tendons. These will cause more pain and may take 6-8 weeks to heal. What are the causes? This condition may be caused by: Trauma, such as a fall or a hit to the body. Twisting or overstretching the back. This may result from doing activities that need a lot of energy, such as lifting heavy objects. What increases the risk? This injury is more common in: Athletes. People with obesity. People who do repeated lifting, bending, or other movements that involve their back. What are the signs or symptoms? Symptoms of this condition may include: Sharp or dull pain in the lower back that does not go away. The pain may extend to the buttocks. Stiffness or limited range of motion. Sudden muscle tightening (spasms). How is this diagnosed? This condition may be diagnosed based on: Your  symptoms. Your medical history. A physical exam. Imaging tests, such as: X-rays. MRI. How is this treated? Treatment for this condition may include: Rest. Applying heat and cold to the affected area. Over-the-counter medicines to help relieve pain and inflammation, such as NSAIDs. Prescription pain medicine and muscle relaxants may be needed for a short time. Physical therapy. Follow these instructions at home: Managing pain, stiffness, and swelling   If directed, put ice on the injured area during the first 24 hours after your injury. Put ice in a plastic bag. Place a towel between your skin and the bag. Leave the ice on for 20 minutes, 2-3 times a day. If directed, apply heat to the affected area as often as told by your health care provider. Use the heat source that your health care provider recommends, such as a moist heat pack or a heating pad. Place a towel between your skin and the heat source. Leave the heat on for 20-30 minutes. Remove the heat if your skin turns bright red. This is especially important if you are unable to feel pain, heat, or cold. You may have a greater risk of getting burned. Activity Rest and return to your normal activities as told by your health care provider. Ask your health care provider what activities are safe for you. Do exercises as told by your health care provider. Medicines Take over-the-counter and prescription medicines only as told by your health care provider. Ask your health care provider if the medicine prescribed to you: Requires you to avoid driving or using heavy machinery.  Can cause constipation. You may need to take these actions to prevent or treat constipation: Drink enough fluid to keep your urine pale yellow. Take over-the-counter or prescription medicines. Eat foods that are high in fiber, such as beans, whole grains, and fresh fruits and vegetables. Limit foods that are high in fat and processed sugars, such as fried or  sweet foods. Injury prevention To prevent a future low-back injury: Always warm up properly before physical activity or sports. Cool down and stretch after being active. Use correct form when playing sports and lifting heavy objects. Bend your knees before you lift heavy objects. Use good posture when sitting and standing. Stay physically fit and keep a healthy weight. Do at least 150 minutes of moderate-intensity exercise each week, such as brisk walking or water aerobics. Do strength exercises at least 2 times each week.  General instructions Do not use any products that contain nicotine or tobacco, such as cigarettes, e-cigarettes, and chewing tobacco. If you need help quitting, ask your health care provider. Keep all follow-up visits as told by your health care provider. This is important. Contact a health care provider if: Your back pain does not improve after 6 weeks of treatment. Your symptoms get worse. Get help right away if: Your back pain is severe. You are unable to stand or walk. You develop pain in your legs. You develop weakness in your buttocks or legs. You have difficulty controlling when you urinate or when you have a bowel movement. You have frequent, painful, or bloody urination. You have a temperature over 101.44F (38.3C) Summary A lumbar strain, which is sometimes called a low-back strain, is a stretch or tear in a muscle or the strong cords of tissue that attach muscle to bone (tendons) in the lower back (lumbar spine). This type of injury occurs when muscles or tendons are torn or are stretched beyond their limits. Rest and return to your normal activities as told by your health care provider. If directed, apply heat and ice to the affected area as often as told by your health care provider. Take over-the-counter and prescription medicines only as told by your health care provider. Contact a health care provider if you have new or worsening symptoms. This  information is not intended to replace advice given to you by your health care provider. Make sure you discuss any questions you have with your health care provider. Document Revised: 11/01/2017 Document Revi  Low Back Sprain or Strain Rehab Ask your health care provider which exercises are safe for you. Do exercises exactly as told by your health care provider and adjust them as directed. It is normal to feel mild stretching, pulling, tightness, or discomfort as you do these exercises. Stop right away if you feel sudden pain or your pain gets worse. Do not begin these exercises until told by your health care provider. Stretching and range-of-motion exercises These exercises warm up your muscles and joints and improve the movement and flexibility of your back. These exercises also help to relieve pain, numbness, and tingling. Lumbar rotation  Lie on your back on a firm bed or the floor with your knees bent. Straighten your arms out to your sides so each arm forms a 90-degree angle (right angle) with a side of your body. Slowly move (rotate) both of your knees to one side of your body until you feel a stretch in your lower back (lumbar). Try not to let your shoulders lift off the floor. Hold this position for __________  seconds. Tense your abdominal muscles and slowly move your knees back to the starting position. Repeat this exercise on the other side of your body. Repeat __________ times. Complete this exercise __________ times a day. Single knee to chest  Lie on your back on a firm bed or the floor with both legs straight. Bend one of your knees. Use your hands to move your knee up toward your chest until you feel a gentle stretch in your lower back and buttock. Hold your leg in this position by holding on to the front of your knee. Keep your other leg as straight as possible. Hold this position for __________ seconds. Slowly return to the starting position. Repeat with your other  leg. Repeat __________ times. Complete this exercise __________ times a day. Prone extension on elbows  Lie on your abdomen on a firm bed or the floor (prone position). Prop yourself up on your elbows. Use your arms to help lift your chest up until you feel a gentle stretch in your abdomen and your lower back. This will place some of your body weight on your elbows. If this is uncomfortable, try stacking pillows under your chest. Your hips should stay down, against the surface that you are lying on. Keep your hip and back muscles relaxed. Hold this position for __________ seconds. Slowly relax your upper body and return to the starting position. Repeat __________ times. Complete this exercise __________ times a day. Strengthening exercises These exercises build strength and endurance in your back. Endurance is the ability to use your muscles for a long time, even after they get tired. Pelvic tilt This exercise strengthens the muscles that lie deep in the abdomen. Lie on your back on a firm bed or the floor with your legs extended. Bend your knees so they are pointing toward the ceiling and your feet are flat on the floor. Tighten your lower abdominal muscles to press your lower back against the floor. This motion will tilt your pelvis so your tailbone points up toward the ceiling instead of pointing to your feet or the floor. To help with this exercise, you may place a small towel under your lower back and try to push your back into the towel. Hold this position for __________ seconds. Let your muscles relax completely before you repeat this exercise. Repeat __________ times. Complete this exercise __________ times a day. Alternating arm and leg raises  Get on your hands and knees on a firm surface. If you are on a hard floor, you may want to use padding, such as an exercise mat, to cushion your knees. Line up your arms and legs. Your hands should be directly below your shoulders, and your  knees should be directly below your hips. Lift your left leg behind you. At the same time, raise your right arm and straighten it in front of you. Do not lift your leg higher than your hip. Do not lift your arm higher than your shoulder. Keep your abdominal and back muscles tight. Keep your hips facing the ground. Do not arch your back. Keep your balance carefully, and do not hold your breath. Hold this position for __________ seconds. Slowly return to the starting position. Repeat with your right leg and your left arm. Repeat __________ times. Complete this exercise __________ times a day. Abdominal set with straight leg raise  Lie on your back on a firm bed or the floor. Bend one of your knees and keep your other leg straight. Tense your abdominal  muscles and lift your straight leg up, 4-6 inches (10-15 cm) off the ground. Keep your abdominal muscles tight and hold this position for __________ seconds. Do not hold your breath. Do not arch your back. Keep it flat against the ground. Keep your abdominal muscles tense as you slowly lower your leg back to the starting position. Repeat with your other leg. Repeat __________ times. Complete this exercise __________ times a day. Single leg lower with bent knees Lie on your back on a firm bed or the floor. Tense your abdominal muscles and lift your feet off the floor, one foot at a time, so your knees and hips are bent in 90-degree angles (right angles). Your knees should be over your hips and your lower legs should be parallel to the floor. Keeping your abdominal muscles tense and your knee bent, slowly lower one of your legs so your toe touches the ground. Lift your leg back up to return to the starting position. Do not hold your breath. Do not let your back arch. Keep your back flat against the ground. Repeat with your other leg. Repeat __________ times. Complete this exercise __________ times a day. Posture and body mechanics Good  posture and healthy body mechanics can help to relieve stress in your body's tissues and joints. Body mechanics refers to the movements and positions of your body while you do your daily activities. Posture is part of body mechanics. Good posture means: Your spine is in its natural S-curve position (neutral). Your shoulders are pulled back slightly. Your head is not tipped forward (neutral). Follow these guidelines to improve your posture and body mechanics in your everyday activities. Standing  When standing, keep your spine neutral and your feet about hip-width apart. Keep a slight bend in your knees. Your ears, shoulders, and hips should line up. When you do a task in which you stand in one place for a long time, place one foot up on a stable object that is 2-4 inches (5-10 cm) high, such as a footstool. This helps keep your spine neutral. Sitting  When sitting, keep your spine neutral and keep your feet flat on the floor. Use a footrest, if necessary, and keep your thighs parallel to the floor. Avoid rounding your shoulders, and avoid tilting your head forward. When working at a desk or a computer, keep your desk at a height where your hands are slightly lower than your elbows. Slide your chair under your desk so you are close enough to maintain good posture. When working at a computer, place your monitor at a height where you are looking straight ahead and you do not have to tilt your head forward or downward to look at the screen. Resting When lying down and resting, avoid positions that are most painful for you. If you have pain with activities such as sitting, bending, stooping, or squatting, lie in a position in which your body does not bend very much. For example, avoid curling up on your side with your arms and knees near your chest (fetal position). If you have pain with activities such as standing for a long time or reaching with your arms, lie with your spine in a neutral position and  bend your knees slightly. Try the following positions: Lying on your side with a pillow between your knees. Lying on your back with a pillow under your knees. Lifting  When lifting objects, keep your feet at least shoulder-width apart and tighten your abdominal muscles. Bend your knees and  hips and keep your spine neutral. It is important to lift using the strength of your legs, not your back. Do not lock your knees straight out. Always ask for help to lift heavy or awkward objects. This information is not intended to replace advice given to you by your health care provider. Make sure you discuss any questions you have with your health care provider. Document Revised: 03/22/2020 Document Reviewed: 03/22/2020 Elsevier Patient Education  [place]. ewed: 11/01/2017 Elsevier Patient Education  [place].

## 2020-10-21 NOTE — Telephone Encounter (Signed)
Called and spoke with patient. She stated that she needs an order for her cpap supplies to be sent to [facility]. She was last seen in September 2022. I advised her that I would go ahead and send in the order for her. She verbalized understanding.   Nothing further needed at time of call.

## 2020-10-30 ENCOUNTER — Other Ambulatory Visit: Payer: Self-pay | Admitting: Family Medicine

## 2020-10-30 ENCOUNTER — Other Ambulatory Visit: Payer: Self-pay | Admitting: Physician Assistant

## 2020-10-30 DIAGNOSIS — K5904 Chronic idiopathic constipation: Secondary | ICD-10-CM

## 2020-10-30 DIAGNOSIS — F330 Major depressive disorder, recurrent, mild: Secondary | ICD-10-CM

## 2020-11-01 NOTE — Telephone Encounter (Signed)
Refill sent to pharmacy.   

## 2020-11-28 ENCOUNTER — Other Ambulatory Visit: Payer: Self-pay | Admitting: Family Medicine

## 2020-11-28 DIAGNOSIS — K5904 Chronic idiopathic constipation: Secondary | ICD-10-CM

## 2020-12-01 DIAGNOSIS — H52203 Unspecified astigmatism, bilateral: Secondary | ICD-10-CM | POA: Diagnosis not present

## 2020-12-01 DIAGNOSIS — H5203 Hypermetropia, bilateral: Secondary | ICD-10-CM | POA: Diagnosis not present

## 2020-12-01 DIAGNOSIS — H18593 Other hereditary corneal dystrophies, bilateral: Secondary | ICD-10-CM | POA: Diagnosis not present

## 2020-12-25 ENCOUNTER — Other Ambulatory Visit: Payer: Self-pay | Admitting: Family Medicine

## 2020-12-25 DIAGNOSIS — K5904 Chronic idiopathic constipation: Secondary | ICD-10-CM

## 2020-12-29 DIAGNOSIS — H10411 Chronic giant papillary conjunctivitis, right eye: Secondary | ICD-10-CM | POA: Diagnosis not present

## 2021-01-01 ENCOUNTER — Other Ambulatory Visit: Payer: Self-pay | Admitting: Family Medicine

## 2021-01-03 NOTE — Progress Notes (Signed)
Subjective:  Patient ID: Diana Hendrix, female    DOB: 10-Aug-1945  Age: 75 y.o. MRN: [id number]  Chief Complaint  Patient presents with   Hypertension   Hyperlipidemia   Gastroesophageal Reflux    Hyperlipidemia: Current medications: Simvastatin 40 mg once daily.   Hypertension: Complications: Current medications: metoprolol xl 50 mg 1 1/2 daily and benicar/hct 40/12.5 mg once daily.  UMP:NTIRW 20 mg M, W, F. ON the days she does not take it her weight increases by 2 lbs, sob.   Pre-diabetes: eats healthy.   GERD:protonix.   Chronic Constipation: Trulance is not strong enough. Has to strain a lot to go daily. Does not feel like she empties her bowels. Takes miralax and it helps. Previously took linzess 290 mg once daily. It is too strong. Would like to try 145 mg daily.   Mild Recurrent Depression: started on celexa 10 mg once daily.  Helping.  PHQ9 SCORE ONLY 01/20/2021 01/04/2021 09/27/2020  PHQ-9 Total Score 0 0 0   Diet: healthy.  Exercise: walking 4000-5000 daily.    Current Outpatient Medications on File Prior to Visit  Medication Sig Dispense Refill   Cholecalciferol (VITAMIN D3) 50 MCG (2000 UT) TABS Take 1 tablet by mouth daily.     citalopram (CELEXA) 10 MG tablet TAKE 1 TABLET(10 MG) BY MOUTH DAILY 90 tablet 0   furosemide (LASIX) 20 MG tablet TAKE 1 TABLET(20 MG) BY MOUTH DAILY (Patient taking differently: Take 20 mg by mouth daily. Taking 3 times weekly. [address] [place], [place]. And Friday.) 90 tablet 1   metoprolol succinate (TOPROL-XL) 50 MG 24 hr tablet Take 1.5 tablets (75 mg total) by mouth daily. Take with or immediately following a meal. 90 tablet 1   pantoprazole (PROTONIX) 40 MG tablet TAKE 1 TABLET BY MOUTH DAILY 90 tablet 1   Polyethyl Glycol-Propyl Glycol (SYSTANE OP) Place 1 drop into both eyes 4 (four) times daily.     prednisoLONE acetate (PRED FORTE) 1 % ophthalmic suspension SMARTSIG:In Eye(s)     simvastatin (ZOCOR) 40 MG tablet TAKE 1 TABLET BY MOUTH DAILY  AT BEDTIME 90 tablet 0   No current facility-administered medications on file prior to visit.   Past Medical History:  Diagnosis Date   Cancer ([place])    Diastolic heart failure ([facility])    Echo EF 60/65% Heart monitor was normal   GERD (gastroesophageal reflux disease)    History of colon polyps    Hyperlipidemia    Hypertension    Macular edema, cystoid    OSA (obstructive sleep apnea) 09/17/2012   Osteopenia    Plantar fasciitis 09/09/2019   Past Surgical History:  Procedure Laterality Date   CESAREAN SECTION     COLONOSCOPY  10/29/2013   Colonic polyp status post polypectomy. Mild sigmoid diverticulosis. Small internal hemorrhoids   FOOT NEUROMA SURGERY     s/p surgery in both feet   FOOT SURGERY Bilateral    Per patient   MENISCUS REPAIR Left 09/13/2016   miniscus repair right  Right 2019   SHOULDER SURGERY Left    arthroscopy.    TUBAL LIGATION      Family History  Problem Relation Age of Onset   Hypertension Mother    Atrial fibrillation Mother    CAD Mother    Congestive Heart Failure Mother    CVA Father    Hypertension Brother    Hypertension Son    Colon cancer Neg Hx    Esophageal cancer Neg Hx  Social History   Socioeconomic History   Marital status: Married    Spouse name: Transport planner   Number of children: 2   Years of education: Not on file   Highest education level: Not on file  Occupational History   Not on file  Tobacco Use   Smoking status: Never   Smokeless tobacco: Never  Vaping Use   Vaping Use: Never used  Substance and Sexual Activity   Alcohol use: No   Drug use: No   Sexual activity: Not Currently  Other Topics Concern   Not on file  Social History Narrative   Lives with husband   Right handed   Caffeine: 1 cup of tea and 1 pepsi in the AM   Social Determinants of Health   Financial Resource Strain: Not on file  Food Insecurity: No Food Insecurity   Worried About Charity fundraiser in the Last Year: Never true   [place] in the Last Year: Never true  Transportation Needs: No Transportation Needs   Lack of Transportation (Medical): No   Lack of Transportation (Non-Medical): No  Physical Activity: Not on file  Stress: Not on file  Social Connections: Not on file    Review of Systems  Constitutional:  Negative for appetite change, fatigue and fever.  HENT:  Positive for rhinorrhea. Negative for congestion, ear pain, sinus pressure and sore throat.   Eyes:  Negative for pain.  Respiratory:  Negative for cough, chest tightness, shortness of breath and wheezing.   Cardiovascular:  Negative for chest pain and palpitations.  Gastrointestinal:  Negative for abdominal pain, constipation, diarrhea, nausea and vomiting.  Genitourinary:  Negative for dysuria and hematuria.  Musculoskeletal:  Negative for arthralgias, back pain, joint swelling and myalgias.  Skin:  Negative for rash.  Neurological:  Negative for dizziness, weakness and headaches.  Psychiatric/Behavioral:  Negative for dysphoric mood. The patient is not nervous/anxious.     Objective:  BP 110/62 (BP Location: Right Arm, Patient Position: Sitting)    Pulse 61    Temp 98.3 F (36.8 C) (Temporal)    Ht 5\' 2"  (1.575 m)    Wt 206 lb (93.4 kg)    SpO2 96%    BMI 37.68 kg/m   BP/Weight 01/20/2021 01/04/2021 03/21/91  Systolic BP 818 299 371  Diastolic BP 60 62 70  Wt. (Lbs) 207.4 206 209  BMI 37.93 37.68 38.23    Physical Exam Vitals reviewed.  Constitutional:      Appearance: Normal appearance. She is normal weight.  HENT:     Left Ear: Tympanic membrane normal.     Nose: Congestion and rhinorrhea present.     Comments: Pale edema    Mouth/Throat:     Mouth: Mucous membranes are moist.  Neck:     Vascular: No carotid bruit.  Cardiovascular:     Rate and Rhythm: Normal rate and regular rhythm.     Pulses: Normal pulses.     Heart sounds: Normal heart sounds.  Pulmonary:     Effort: Pulmonary effort is normal. No respiratory  distress.     Breath sounds: Normal breath sounds.  Abdominal:     General: Abdomen is flat. Bowel sounds are normal.     Palpations: Abdomen is soft.     Tenderness: There is no abdominal tenderness.  Neurological:     Mental Status: She is alert and oriented to person, place, and time.  Psychiatric:  Mood and Affect: Mood normal.        Behavior: Behavior normal.    Diabetic Foot Exam - Simple   No data filed      Lab Results  Component Value Date   WBC 6.0 01/04/2021   HGB 13.9 01/04/2021   HCT 41.1 01/04/2021   PLT 285 01/04/2021   GLUCOSE 107 (H) 01/04/2021   CHOL 171 01/04/2021   TRIG 152 (H) 01/04/2021   HDL 47 01/04/2021   LDLCALC 97 01/04/2021   ALT 21 01/04/2021   AST 23 01/04/2021   NA 132 (L) 01/04/2021   K 4.3 01/04/2021   CL 93 (L) 01/04/2021   CREATININE 1.14 (H) 01/04/2021   BUN 13 01/04/2021   CO2 25 01/04/2021   TSH 1.280 09/27/2020   HGBA1C 6.1 (H) 01/04/2021      Assessment & Plan:   Problem List Items Addressed This Visit       Cardiovascular and Mediastinum   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  BP at goal, but if does not take lasix weight increases and becomes sob.  Recommend increase lasix to 20 mg daily.  I may add potassium based on labs.          Respiratory   Allergic rhinitis    Recommend try allegra for allergies.        OSA (obstructive sleep apnea)     Digestive   GERD (gastroesophageal reflux disease)    The current medical regimen is effective;  continue present plan and medications.       Chronic idiopathic constipation    Stop trulance. Start on linzess 145 mg once daily.  Call back at the first of the year and let me know if it is working, so I can send you a prescription.         Other   Hyperlipidemia    The current medical regimen is effective;  continue present plan and medications. Recommend continue to work on  eating healthy diet and exercise.       Relevant Orders   Lipid panel (Completed)   Mild recurrent major depression ([place])    The current medical regimen is effective;  continue present plan and medications.       Prediabetes   Relevant Orders   Hemoglobin A1c (Completed)  .  No orders of the defined types were placed in this encounter.   Orders Placed This Encounter  Procedures   CBC with Differential/Platelet   Comprehensive metabolic panel   Hemoglobin A1c   Lipid panel   Cardiovascular Risk Assessment     Follow-up: Return in about 3 months (around 04/04/2021) for chronic fasting.  An After Visit Summary was printed and given to the patient.  I,Lauren M Auman,acting as a scribe for Rochel Brome, MD.,have documented all relevant documentation on the behalf of Rochel Brome, MD,as directed by  Rochel Brome, MD while in the presence of Rochel Brome, MD.    Rochel Brome, MD [place] [phone]

## 2021-01-04 ENCOUNTER — Other Ambulatory Visit: Payer: Self-pay

## 2021-01-04 ENCOUNTER — Ambulatory Visit (INDEPENDENT_AMBULATORY_CARE_PROVIDER_SITE_OTHER): Payer: Medicare Other

## 2021-01-04 ENCOUNTER — Ambulatory Visit (INDEPENDENT_AMBULATORY_CARE_PROVIDER_SITE_OTHER): Payer: Medicare Other | Admitting: Family Medicine

## 2021-01-04 ENCOUNTER — Encounter: Payer: Self-pay | Admitting: Family Medicine

## 2021-01-04 VITALS — BP 110/62 | HR 61 | Temp 98.3°F | Ht 62.0 in | Wt 206.0 lb

## 2021-01-04 DIAGNOSIS — F330 Major depressive disorder, recurrent, mild: Secondary | ICD-10-CM | POA: Diagnosis not present

## 2021-01-04 DIAGNOSIS — J309 Allergic rhinitis, unspecified: Secondary | ICD-10-CM

## 2021-01-04 DIAGNOSIS — K219 Gastro-esophageal reflux disease without esophagitis: Secondary | ICD-10-CM | POA: Diagnosis not present

## 2021-01-04 DIAGNOSIS — G4733 Obstructive sleep apnea (adult) (pediatric): Secondary | ICD-10-CM | POA: Diagnosis not present

## 2021-01-04 DIAGNOSIS — I10 Essential (primary) hypertension: Secondary | ICD-10-CM

## 2021-01-04 DIAGNOSIS — R7303 Prediabetes: Secondary | ICD-10-CM

## 2021-01-04 DIAGNOSIS — Z23 Encounter for immunization: Secondary | ICD-10-CM | POA: Diagnosis not present

## 2021-01-04 DIAGNOSIS — I110 Hypertensive heart disease with heart failure: Secondary | ICD-10-CM

## 2021-01-04 DIAGNOSIS — E782 Mixed hyperlipidemia: Secondary | ICD-10-CM | POA: Diagnosis not present

## 2021-01-04 DIAGNOSIS — K5904 Chronic idiopathic constipation: Secondary | ICD-10-CM

## 2021-01-04 NOTE — Patient Instructions (Addendum)
Stop trulance. Start on linzess 145 mg once daily.  Call back at the first of the year and let me know if it is working, so I can send you a prescription. Recommend try allegra for allergies.  Recommend increase lasix to 20 mg daily.  I may add potassium based on labs.

## 2021-01-05 LAB — CBC WITH DIFFERENTIAL/PLATELET
Basophils Absolute: 0 10*3/uL (ref 0.0–0.2)
Basos: 1 %
EOS (ABSOLUTE): 0.1 10*3/uL (ref 0.0–0.4)
Eos: 2 %
Hematocrit: 41.1 % (ref 34.0–46.6)
Hemoglobin: 13.9 g/dL (ref 11.1–15.9)
Immature Grans (Abs): 0 10*3/uL (ref 0.0–0.1)
Immature Granulocytes: 0 %
Lymphocytes Absolute: 1.8 10*3/uL (ref 0.7–3.1)
Lymphs: 31 %
MCH: 30.3 pg (ref 26.6–33.0)
MCHC: 33.8 g/dL (ref 31.5–35.7)
MCV: 90 fL (ref 79–97)
Monocytes Absolute: 0.5 10*3/uL (ref 0.1–0.9)
Monocytes: 8 %
Neutrophils Absolute: 3.5 10*3/uL (ref 1.4–7.0)
Neutrophils: 58 %
Platelets: 285 10*3/uL (ref 150–450)
RBC: 4.58 x10E6/uL (ref 3.77–5.28)
RDW: 12.7 % (ref 11.7–15.4)
WBC: 6 10*3/uL (ref 3.4–10.8)

## 2021-01-05 LAB — COMPREHENSIVE METABOLIC PANEL
ALT: 21 IU/L (ref 0–32)
AST: 23 IU/L (ref 0–40)
Albumin/Globulin Ratio: 1.7 (ref 1.2–2.2)
Albumin: 4.7 g/dL (ref 3.7–4.7)
Alkaline Phosphatase: 81 IU/L (ref 44–121)
BUN/Creatinine Ratio: 11 — ABNORMAL LOW (ref 12–28)
BUN: 13 mg/dL (ref 8–27)
Bilirubin Total: 0.6 mg/dL (ref 0.0–1.2)
CO2: 25 mmol/L (ref 20–29)
Calcium: 9.7 mg/dL (ref 8.7–10.3)
Chloride: 93 mmol/L — ABNORMAL LOW (ref 96–106)
Creatinine, Ser: 1.14 mg/dL — ABNORMAL HIGH (ref 0.57–1.00)
Globulin, Total: 2.7 g/dL (ref 1.5–4.5)
Glucose: 107 mg/dL — ABNORMAL HIGH (ref 70–99)
Potassium: 4.3 mmol/L (ref 3.5–5.2)
Sodium: 132 mmol/L — ABNORMAL LOW (ref 134–144)
Total Protein: 7.4 g/dL (ref 6.0–8.5)
eGFR: 51 mL/min/{1.73_m2} — ABNORMAL LOW (ref >59)

## 2021-01-05 LAB — LIPID PANEL
Chol/HDL Ratio: 3.6 ratio (ref 0.0–4.4)
Cholesterol, Total: 171 mg/dL (ref 100–199)
HDL: 47 mg/dL (ref >39)
LDL Chol Calc (NIH): 97 mg/dL (ref 0–99)
Triglycerides: 152 mg/dL — ABNORMAL HIGH (ref 0–149)
VLDL Cholesterol Cal: 27 mg/dL (ref 5–40)

## 2021-01-05 LAB — HEMOGLOBIN A1C
Est. average glucose Bld gHb Est-mCnc: 128 mg/dL
Hgb A1c MFr Bld: 6.1 % — ABNORMAL HIGH (ref 4.8–5.6)

## 2021-01-05 NOTE — Progress Notes (Signed)
Blood count normal.  Liver function normal.  Kidney function abnormal but fairly stable.  Cholesterol good.  Hba1c 6.  Recommend continue to work on eating healthy diet and exercise.

## 2021-01-08 ENCOUNTER — Other Ambulatory Visit: Payer: Self-pay | Admitting: Physician Assistant

## 2021-01-08 DIAGNOSIS — I5032 Chronic diastolic (congestive) heart failure: Secondary | ICD-10-CM

## 2021-01-15 ENCOUNTER — Other Ambulatory Visit: Payer: Self-pay | Admitting: Family Medicine

## 2021-01-15 DIAGNOSIS — J301 Allergic rhinitis due to pollen: Secondary | ICD-10-CM

## 2021-01-20 ENCOUNTER — Ambulatory Visit (INDEPENDENT_AMBULATORY_CARE_PROVIDER_SITE_OTHER): Payer: Medicare Other

## 2021-01-20 VITALS — BP 112/60 | Resp 16 | Ht 62.0 in | Wt 207.4 lb

## 2021-01-20 DIAGNOSIS — Z1231 Encounter for screening mammogram for malignant neoplasm of breast: Secondary | ICD-10-CM

## 2021-01-20 DIAGNOSIS — Z0000 Encounter for general adult medical examination without abnormal findings: Secondary | ICD-10-CM | POA: Diagnosis not present

## 2021-01-20 NOTE — Patient Instructions (Signed)
Fall Prevention in the Home, Adult Falls can cause injuries and can happen to people of all ages. There are many things you can do to make your home safe and to help prevent falls. Ask for help when making these changes. What actions can I take to prevent falls? General Instructions Use good lighting in all rooms. Replace any light bulbs that burn out. Turn on the lights in dark areas. Use night-lights. Keep items that you use often in easy-to-reach places. Lower the shelves around your home if needed. Set up your furniture so you have a clear path. Avoid moving your furniture around. Do not have throw rugs or other things on the floor that can make you trip. Avoid walking on wet floors. If any of your floors are uneven, fix them. Add color or contrast paint or tape to clearly mark and help you see: Grab bars or handrails. First and last steps of staircases. Where the edge of each step is. If you use a stepladder: Make sure that it is fully opened. Do not climb a closed stepladder. Make sure the sides of the stepladder are locked in place. Ask someone to hold the stepladder while you use it. Know where your pets are when moving through your home. What can I do in the bathroom?   Keep the floor dry. Clean up any water on the floor right away. Remove soap buildup in the tub or shower. Use nonskid mats or decals on the floor of the tub or shower. Attach bath mats securely with double-sided, nonslip rug tape. If you need to sit down in the shower, use a plastic, nonslip stool. Install grab bars by the toilet and in the tub and shower. Do not use towel bars as grab bars. What can I do in the bedroom? Make sure that you have a light by your bed that is easy to reach. Do not use any sheets or blankets for your bed that hang to the floor. Have a firm chair with side arms that you can use for support when you get dressed. What can I do in the kitchen? Clean up any spills right away. If you  need to reach something above you, use a step stool with a grab bar. Keep electrical cords out of the way. Do not use floor polish or wax that makes floors slippery. What can I do with my stairs? Do not leave any items on the stairs. Make sure that you have a light switch at the top and the bottom of the stairs. Make sure that there are handrails on both sides of the stairs. Fix handrails that are broken or loose. Install nonslip stair treads on all your stairs. Avoid having throw rugs at the top or bottom of the stairs. Choose a carpet that does not hide the edge of the steps on the stairs. Check carpeting to make sure that it is firmly attached to the stairs. Fix carpet that is loose or worn. What can I do on the outside of my home? Use bright outdoor lighting. Fix the edges of walkways and driveways and fix any cracks. Remove anything that might make you trip as you walk through a door, such as a raised step or threshold. Trim any bushes or trees on paths to your home. Check to see if handrails are loose or broken and that both sides of all steps have handrails. Install guardrails along the edges of any raised decks and porches. Clear paths of anything that can  make you trip, such as tools or rocks. Have leaves, snow, or ice cleared regularly. Use sand or salt on paths during winter. Clean up any spills in your garage right away. This includes grease or oil spills. What other actions can I take? Wear shoes that: Have a low heel. Do not wear high heels. Have rubber bottoms. Feel good on your feet and fit well. Are closed at the toe. Do not wear open-toe sandals. Use tools that help you move around if needed. These include: Canes. Walkers. Scooters. Crutches. Review your medicines with your doctor. Some medicines can make you feel dizzy. This can increase your chance of falling. Ask your doctor what else you can do to help prevent falls. Where to find more information Centers for  Disease Control and Prevention, [employer]: http://www.wolf.info/ National Institute on Aging: http://kim-miller.com/ Contact a doctor if: You are afraid of falling at home. You feel weak, drowsy, or dizzy at home. You fall at home. Summary There are many simple things that you can do to make your home safe and to help prevent falls. Ways to make your home safe include removing things that can make you trip and installing grab bars in the bathroom. Ask for help when making these changes in your home. This information is not intended to replace advice given to you by your health care provider. Make sure you discuss any questions you have with your health care provider. Document Revised: 08/06/2019 Document Reviewed: 08/06/2019 Elsevier Patient Education  [place] Maintenance, Female Adopting a healthy lifestyle and getting preventive care are important in promoting health and wellness. Ask your health care provider about: The right schedule for you to have regular tests and exams. Things you can do on your own to prevent diseases and keep yourself healthy. What should I know about diet, weight, and exercise? Eat a healthy diet  Eat a diet that includes plenty of vegetables, fruits, low-fat dairy products, and lean protein. Do not eat a lot of foods that are high in solid fats, added sugars, or sodium. Maintain a healthy weight Body mass index (BMI) is used to identify weight problems. It estimates body fat based on height and weight. Your health care provider can help determine your BMI and help you achieve or maintain a healthy weight. Get regular exercise Get regular exercise. This is one of the most important things you can do for your health. Most adults should: Exercise for at least 150 minutes each week. The exercise should increase your heart rate and make you sweat (moderate-intensity exercise). Do strengthening exercises at least twice a week. This is in addition to the  moderate-intensity exercise. Spend less time sitting. Even light physical activity can be beneficial. Watch cholesterol and blood lipids Have your blood tested for lipids and cholesterol at 75 years of age, then have this test every 5 years. Have your cholesterol levels checked more often if: Your lipid or cholesterol levels are high. You are older than 75 years of age. You are at high risk for heart disease. What should I know about cancer screening? Depending on your health history and family history, you may need to have cancer screening at various ages. This may include screening for: Breast cancer. Cervical cancer. Colorectal cancer. Skin cancer. Lung cancer. What should I know about heart disease, diabetes, and high blood pressure? Blood pressure and heart disease High blood pressure causes heart disease and increases the risk of stroke. This is more likely to develop in  people who have high blood pressure readings or are overweight. Have your blood pressure checked: Every 3-5 years if you are 85-86 years of age. Every year if you are 51 years old or older. Diabetes Have regular diabetes screenings. This checks your fasting blood sugar level. Have the screening done: Once every three years after age 86 if you are at a normal weight and have a low risk for diabetes. More often and at a younger age if you are overweight or have a high risk for diabetes. What should I know about preventing infection? Hepatitis B If you have a higher risk for hepatitis B, you should be screened for this virus. Talk with your health care provider to find out if you are at risk for hepatitis B infection. Hepatitis C Testing is recommended for: Everyone born from [birth year] through 1964. Anyone with known risk factors for hepatitis C. Sexually transmitted infections (STIs) Get screened for STIs, including gonorrhea and chlamydia, if: You are sexually active and are younger than 75 years of age. You are  older than 75 years of age and your health care provider tells you that you are at risk for this type of infection. Your sexual activity has changed since you were last screened, and you are at increased risk for chlamydia or gonorrhea. Ask your health care provider if you are at risk. Ask your health care provider about whether you are at high risk for HIV. Your health care provider may recommend a prescription medicine to help prevent HIV infection. If you choose to take medicine to prevent HIV, you should first get tested for HIV. You should then be tested every 3 months for as long as you are taking the medicine. Pregnancy If you are about to stop having your period (premenopausal) and you may become pregnant, seek counseling before you get pregnant. Take 400 to 800 micrograms (mcg) of folic acid every day if you become pregnant. Ask for birth control (contraception) if you want to prevent pregnancy. Osteoporosis and menopause Osteoporosis is a disease in which the bones lose minerals and strength with aging. This can result in bone fractures. If you are 53 years old or older, or if you are at risk for osteoporosis and fractures, ask your health care provider if you should: Be screened for bone loss. Take a calcium or vitamin D supplement to lower your risk of fractures. Be given hormone replacement therapy (HRT) to treat symptoms of menopause. Follow these instructions at home: Alcohol use Do not drink alcohol if: Your health care provider tells you not to drink. You are pregnant, may be pregnant, or are planning to become pregnant. If you drink alcohol: Limit how much you have to: 0-1 drink a day. Know how much alcohol is in your drink. In the [place] one drink equals one 12 oz bottle of beer (355 mL), one 5 oz glass of wine (148 mL), or one 1 oz glass of hard liquor (44 mL). Lifestyle Do not use any products that contain nicotine or tobacco. These products include cigarettes, chewing  tobacco, and vaping devices, such as e-cigarettes. If you need help quitting, ask your health care provider. Do not use street drugs. Do not share needles. Ask your health care provider for help if you need support or information about quitting drugs. General instructions Schedule regular health, dental, and eye exams. Stay current with your vaccines. Tell your health care provider if: You often feel depressed. You have ever been abused or do not feel  safe at home. Summary Adopting a healthy lifestyle and getting preventive care are important in promoting health and wellness. Follow your health care provider's instructions about healthy diet, exercising, and getting tested or screened for diseases. Follow your health care provider's instructions on monitoring your cholesterol and blood pressure. This information is not intended to replace advice given to you by your health care provider. Make sure you discuss any questions you have with your health care provider. Document Revised: 05/24/2020 Document Reviewed: 05/24/2020 Elsevier Patient Education  [place].

## 2021-01-20 NOTE — Progress Notes (Signed)
Subjective:   Diana Hendrix is a 76 y.o. female who presents for Medicare Annual (Subsequent) preventive examination.  This wellness visit is conducted by a nurse.  The patient's medications were reviewed and reconciled since the patient's last visit.  History details were provided by the patient.  The history appears to be reliable.    Patient's last AWV was one year ago.   Medical History: Patient history and Family history was reviewed  Medications, Allergies, and preventative health maintenance was reviewed and updated.   Review of Systems    ROS-Negative Cardiac Risk Factors include: advanced age (>57men, >33 women);obesity (BMI >30kg/m2)     Objective:    Today's Vitals   01/20/21 1059  BP: 112/60  Resp: 16  SpO2: 97%  Weight: 207 lb 6.4 oz (94.1 kg)  Height: 5\' 2"  (1.575 m)  PainSc: 0-No pain   Body mass index is 37.93 kg/m.  Advanced Directives 01/08/2020 05/14/2018 04/17/2017  Does Patient Have a Medical Advance Directive? Yes Yes Yes  Type of Advance Directive - [place];Living will [place]  Does patient want to make changes to medical advance directive? - - No - Patient declined  Copy of [place] in Chart? - No - copy requested No - copy requested    Current Medications (verified) Outpatient Encounter Medications as of 01/20/2021  Medication Sig   Cholecalciferol (VITAMIN D3) 50 MCG (2000 UT) TABS Take 1 tablet by mouth daily.   citalopram (CELEXA) 10 MG tablet TAKE 1 TABLET(10 MG) BY MOUTH DAILY   furosemide (LASIX) 20 MG tablet TAKE 1 TABLET(20 MG) BY MOUTH DAILY (Patient taking differently: Take 20 mg by mouth daily. Taking 3 times weekly. [place], [place]. And Friday.)   linaclotide (LINZESS) 290 MCG CAPS capsule Take 290 mcg by mouth daily before breakfast.   metoprolol succinate (TOPROL-XL) 50 MG 24 hr tablet Take 1.5 tablets (75 mg total) by mouth daily. Take with or immediately following a meal.    montelukast (SINGULAIR) 10 MG tablet TAKE 1 TABLET(10 MG) BY MOUTH AT BEDTIME   olmesartan-hydrochlorothiazide (BENICAR HCT) 40-12.5 MG tablet TAKE 1 TABLET BY MOUTH DAILY   pantoprazole (PROTONIX) 40 MG tablet TAKE 1 TABLET BY MOUTH DAILY   Polyethyl Glycol-Propyl Glycol (SYSTANE OP) Place 1 drop into both eyes 4 (four) times daily.   prednisoLONE acetate (PRED FORTE) 1 % ophthalmic suspension SMARTSIG:In Eye(s)   simvastatin (ZOCOR) 40 MG tablet TAKE 1 TABLET BY MOUTH DAILY AT BEDTIME   [DISCONTINUED] TRULANCE 3 MG TABS TAKE 1 TABLET BY MOUTH DAILY (Patient not taking: Reported on 01/20/2021)   No facility-administered encounter medications on file as of 01/20/2021.    Allergies (verified) Zyrtec [cetirizine]   History: Past Medical History:  Diagnosis Date   Cancer ([place])    Diastolic heart failure ([facility])    Echo EF 60/65% Heart monitor was normal   GERD (gastroesophageal reflux disease)    History of colon polyps    Hyperlipidemia    Hypertension    Macular edema, cystoid    OSA (obstructive sleep apnea) 09/17/2012   Osteopenia    Plantar fasciitis 09/09/2019   Past Surgical History:  Procedure Laterality Date   CESAREAN SECTION     COLONOSCOPY  10/29/2013   Colonic polyp status post polypectomy. Mild sigmoid diverticulosis. Small internal hemorrhoids   FOOT NEUROMA SURGERY     s/p surgery in both feet   FOOT SURGERY Bilateral    Per patient  MENISCUS REPAIR Left 09/13/2016   miniscus repair right  Right 2019   SHOULDER SURGERY Left    arthroscopy.    TUBAL LIGATION     Family History  Problem Relation Age of Onset   Hypertension Mother    Atrial fibrillation Mother    CAD Mother    Congestive Heart Failure Mother    CVA Father    Hypertension Brother    Hypertension Son    Colon cancer Neg Hx    Esophageal cancer Neg Hx    Social History   Socioeconomic History   Marital status: Married    Spouse name: Transport planner   Number of children: 2  Tobacco Use    Smoking status: Never   Smokeless tobacco: Never  Vaping Use   Vaping Use: Never used  Substance and Sexual Activity   Alcohol use: No   Drug use: No   Sexual activity: Not Currently  Other Topics Concern   Not on file  Social History Narrative   Lives with husband   Right handed   Caffeine: 1 cup of tea and 1 pepsi in the AM   Social Determinants of Health   Financial Resource Strain: Not on file  Food Insecurity: No Food Insecurity   Worried About Charity fundraiser in the Last Year: Never true   Ran Out of Food in the Last Year: Never true  Transportation Needs: No Transportation Needs   Lack of Transportation (Medical): No   Lack of Transportation (Non-Medical): No  Physical Activity: Not on file  Stress: Not on file  Social Connections: Not on file    Tobacco Counseling Counseling given: No tobacco products used by patient   Clinical Intake:  Pre-visit preparation completed: Yes  Pain : No/denies pain Pain Score: 0-No pain   BMI - recorded: 37.93 Nutritional Status: BMI > 30  Obese Nutritional Risks: None Diabetes: No How often do you need to have someone help you when you read instructions, pamphlets, or other written materials from your doctor or pharmacy?: 1 - Never Interpreter Needed?: No    Activities of Daily Living In your present state of health, do you have any difficulty performing the following activities: 01/20/2021 07/20/2020  Hearing? N N  Vision? N N  Difficulty concentrating or making decisions? N N  Walking or climbing stairs? N N  Dressing or bathing? N N  Doing errands, shopping? N N  Preparing Food and eating ? N -  Using the Toilet? N -  In the past six months, have you accidently leaked urine? N -  Do you have problems with loss of bowel control? N -  Managing your Medications? N -  Managing your Finances? N -  Housekeeping or managing your Housekeeping? N -  Some recent data might be hidden    Patient Care Team: Rochel Brome,  MD as PCP - General (Family Medicine) Jola Schmidt, MD as Consulting Physician (Ophthalmology) Chesley Mires, MD as Consulting Physician (Pulmonary Disease) Richardo Priest, MD as Consulting Physician (Cardiology) Jackquline Denmark, MD as Consulting Physician (Gastroenterology)     Assessment:   This is a routine wellness examination for Diana Hendrix.  Hearing/Vision screen No results found.  Dietary issues and exercise activities discussed: Current Exercise Habits: The patient does not participate in regular exercise at present, Exercise limited by: None identified   Goals Addressed             This Visit's Progress    Patient Stated  Better sleep        Depression Screen PHQ 2/9 Scores 01/20/2021 01/04/2021 09/27/2020 07/20/2020 06/21/2020 05/21/2020 01/12/2020  PHQ - 2 Score 0 0 0 0 0 0 0  PHQ- 9 Score - 0 - - 0 - -    Fall Risk Fall Risk  01/20/2021 09/27/2020 08/02/2020 07/20/2020 05/21/2020  Falls in the past year? 0 0 0 0 0  Comment - - - - -  Number falls in past yr: 0 0 0 0 0  Injury with Fall? 0 0 0 0 0  Risk for fall due to : No Fall Risks - - No Fall Risks -  Follow up Falls evaluation completed;Education provided - Falls evaluation completed Falls evaluation completed -    FALL RISK PREVENTION PERTAINING TO THE HOME:  Any stairs in or around the home? Yes  If so, are there any without handrails? No  Home free of loose throw rugs in walkways, pet beds, electrical cords, etc? Yes  Adequate lighting in your home to reduce risk of falls? Yes   ASSISTIVE DEVICES UTILIZED TO PREVENT FALLS:  Use of a cane, walker or w/c? No  Gait steady and fast without use of assistive device  Cognitive Function:     6CIT Screen 01/20/2021 01/12/2020  What Year? 0 points 0 points  What month? 0 points 0 points  What time? 0 points 0 points  Count back from 20 0 points 0 points  Months in reverse 0 points 0 points  Repeat phrase 0 points 0 points  Total Score 0 0     Immunizations Immunization History  Administered Date(s) Administered   Fluad Quad(high Dose 65+) 10/16/2018, 11/10/2019, 09/27/2020   Influenza, High Dose Seasonal PF 10/09/2016, 10/04/2017   Influenza,inj,Quad PF,6+ Mos 10/25/2012, 09/17/2013, 09/21/2015   Influenza-Unspecified 12/12/2014   Moderna Covid-19 Vaccine Bivalent Booster 70yrs & up 01/04/2021   Moderna SARS-COV2 Booster Vaccination 06/21/2020   Moderna Sars-Covid-2 Vaccination 02/28/2019, 03/28/2019, 11/14/2019   Pneumococcal Conjugate-13 03/18/2015   Pneumococcal Polysaccharide-23 02/09/2011, 09/17/2013   Tdap 03/18/2015   Zoster, Live 09/17/2011    TDAP status: Up to date  Flu Vaccine status: Up to date  Pneumococcal vaccine status: Up to date  Covid-19 vaccine status: Completed vaccines  Qualifies for Shingles Vaccine? Yes   Zostavax completed Yes   Shingrix Completed?: No.    Education has been provided regarding the importance of this vaccine. Patient has been advised to call insurance company to determine out of pocket expense if they have not yet received this vaccine. Advised may also receive vaccine at local pharmacy or Health Dept. Verbalized acceptance and understanding.  Screening Tests Health Maintenance  Topic Date Due   Zoster Vaccines- Shingrix (1 of 2) Never done   MAMMOGRAM  02/09/2021   TETANUS/TDAP  03/17/2025   COLONOSCOPY (Pts 45-17yrs Insurance coverage will need to be confirmed)  02/23/2029   Pneumonia Vaccine 64+ Years old  Completed   INFLUENZA VACCINE  Completed   DEXA SCAN  Completed   COVID-19 Vaccine  Completed   Hepatitis C Screening  Completed   HPV VACCINES  Aged Out    Health Maintenance  Health Maintenance Due  Topic Date Due   Zoster Vaccines- Shingrix (1 of 2) Never done    Colorectal cancer screening: Type of screening: Colonoscopy. Completed 02/2019.   Mammogram status: Completed 01/2020. Repeat every year  Bone Density status: Completed 01/2020. Results  reflect: Bone density results: OSTEOPENIA. Repeat every 2 years.  Lung Cancer Screening: (Low  Dose CT Chest recommended if Age 82-80 years, 30 pack-year currently smoking OR have quit w/in 15years.) does not qualify.    Additional Screening:  Vision Screening: Recommended annual ophthalmology exams for early detection of glaucoma and other disorders of the eye. Is the patient up to date with their annual eye exam?  Yes  Who is the provider or what is the name of the office in which the patient attends annual eye exams? Dr Delman Cheadle  Dental Screening: Recommended annual dental exams for proper oral hygiene     Plan:    1- Schedule Mammogram 2- Continue healthy diet and home exercise 3- I will let Dr Tobie Poet know your request to restart Amitriptyline 20 mg QHS 4- Shingrix Vaccine recommended at pharmacy  I have personally reviewed and noted the following in the patients chart:   Medical and social history Use of alcohol, tobacco or illicit drugs  Current medications and supplements including opioid prescriptions.  Functional ability and status Nutritional status Physical activity Advanced directives List of other physicians Hospitalizations, surgeries, and ER visits in previous 12 months Vitals Screenings to include cognitive, depression, and falls Referrals and appointments  In addition, I have reviewed and discussed with patient certain preventive protocols, quality metrics, and best practice recommendations. A written personalized care plan for preventive services as well as general preventive health recommendations were provided to patient.     Erie Noe, LPN   01/21/1094

## 2021-01-29 ENCOUNTER — Other Ambulatory Visit: Payer: Self-pay | Admitting: Family Medicine

## 2021-01-29 DIAGNOSIS — F330 Major depressive disorder, recurrent, mild: Secondary | ICD-10-CM

## 2021-01-30 DIAGNOSIS — J309 Allergic rhinitis, unspecified: Secondary | ICD-10-CM | POA: Insufficient documentation

## 2021-01-30 DIAGNOSIS — R7303 Prediabetes: Secondary | ICD-10-CM | POA: Insufficient documentation

## 2021-01-30 DIAGNOSIS — F330 Major depressive disorder, recurrent, mild: Secondary | ICD-10-CM | POA: Insufficient documentation

## 2021-01-30 NOTE — Assessment & Plan Note (Signed)
•   Stop trulance. Start on linzess 145 mg once daily.   Call back at the first of the year and let me know if it is working, so I can send you a prescription.

## 2021-01-30 NOTE — Assessment & Plan Note (Signed)
The current medical regimen is effective;  continue present plan and medications.  

## 2021-01-30 NOTE — Assessment & Plan Note (Signed)
•   Recommend try allegra for allergies.

## 2021-01-30 NOTE — Assessment & Plan Note (Signed)
•   Echo showed diastolic heart failure.   BP at goal, but if does not take lasix weight increases and becomes sob.   Recommend increase lasix to 20 mg daily.   I may add potassium based on labs.

## 2021-01-30 NOTE — Assessment & Plan Note (Signed)
The current medical regimen is effective;  continue present plan and medications. Recommend continue to work on eating healthy diet and exercise.  

## 2021-02-07 ENCOUNTER — Telehealth: Payer: Self-pay | Admitting: Family Medicine

## 2021-02-07 NOTE — Telephone Encounter (Signed)
° °  Diana Hendrix has been scheduled for the following appointment:  WHAT: SCREENING MAMMOGRAM WHERE: [facility] DATE: 03/01/21 TIME: 11:45 AM ARRIVAL TIME  Patient has been made aware.

## 2021-02-08 ENCOUNTER — Telehealth: Payer: Self-pay

## 2021-02-08 NOTE — Telephone Encounter (Signed)
Patient wanting to make an appointment as she had syncopal episode Monday night.   She recently had oral skin graft. She finished antibiotic for this on Saturday and has been taking linzess. Beginning Saturday she had diarrhea x1 a day following the medication. Worse on Sunday. On Monday she felt as if she would need to go but could not. Monday PM she began having abd pain and was able to go. She began feeling lightheaded and weak and attempted to make it to bed. Before could reach bed she passed out. Husband picked up off floor. He feels she almost passed out again while he was moving her into bed. Once in bed she started feeling better. Has not had another episode since. Denies head injury. Complains of continuing weakness, back pain, shaky, and has been having intermittent migraines. Advised pt be seen by urgent care/ED as she needed evaluation after episode. She stated she did not want to do this and wanted to see her provider. Again advised she go as we did not availability today. She requested appointment for tomorrow.   Appointment made at request of pt. Advised if begins feeling worse she is to proceed to ED. Pt VU and husband is with pt.   Royce Macadamia, [place] 02/08/21 9:15 AM

## 2021-02-09 ENCOUNTER — Other Ambulatory Visit: Payer: Self-pay

## 2021-02-09 ENCOUNTER — Ambulatory Visit (INDEPENDENT_AMBULATORY_CARE_PROVIDER_SITE_OTHER): Payer: Medicare Other | Admitting: Family Medicine

## 2021-02-09 ENCOUNTER — Encounter: Payer: Self-pay | Admitting: Family Medicine

## 2021-02-09 VITALS — Temp 96.7°F | Resp 16 | Wt 204.6 lb

## 2021-02-09 DIAGNOSIS — R42 Dizziness and giddiness: Secondary | ICD-10-CM | POA: Insufficient documentation

## 2021-02-09 DIAGNOSIS — R55 Syncope and collapse: Secondary | ICD-10-CM | POA: Insufficient documentation

## 2021-02-09 DIAGNOSIS — I10 Essential (primary) hypertension: Secondary | ICD-10-CM | POA: Diagnosis not present

## 2021-02-09 NOTE — Assessment & Plan Note (Signed)
Likely main cause.  Discussed getting up slowly. Patient has understanding of vasovagal syncope.  Recommendations as above.

## 2021-02-09 NOTE — Assessment & Plan Note (Signed)
Check labs 

## 2021-02-09 NOTE — Patient Instructions (Signed)
Bp is little low.  Recommend decrease metoprolol xl to 50 mg daily.  Check labs.

## 2021-02-09 NOTE — Assessment & Plan Note (Signed)
?  multifactorial 

## 2021-02-09 NOTE — Assessment & Plan Note (Signed)
Bp is little low normal. This in combination with vasovagal may be contributing to syncope. Recommend decrease metoprolol xl to 50 mg daily.  Check labs.

## 2021-02-09 NOTE — Progress Notes (Signed)
Acute Office Visit  Subjective:    Patient ID: Diana Hendrix, female    DOB: Dec 27, 1945, 76 y.o.   MRN: [id number]  Chief Complaint  Patient presents with   Loss of Consciousness   Constipation    HPI: Patient is in today after having a syncopal episode on Monday evening.  She reports having oral surgery 1 1/2 weeks ago with completion of antibiotics last Saturday.  She experienced diarrhea on Saturday after taking her linzess and then again on Sunday.  She held her linzess on Monday, in the evening she was experiencing low abdominal cramping and nausea.  Patient attempted to go to the bathroom, but could not go. Did not strain. Then she got up and had a syncopal episode going from the bathroom to her bedroom. Went back to the bathroom after laying for a little while and was able to have a bm. Patient was initially a little constipated and then had diarrhea.   Similar episode approximately 2 yrs ago during which she was receiving a knee injection and subsequently passed out and was taken to [facility]. Thorough work up negative.   CIC: on linzess 290 mg once daily works better than linzess 145 mg daily.   Past Medical History:  Diagnosis Date   Cancer ([place])    Diastolic heart failure (HCC)    Echo EF 60/65% Heart monitor was normal   GERD (gastroesophageal reflux disease)    History of colon polyps    Hyperlipidemia    Hypertension    Macular edema, cystoid    OSA (obstructive sleep apnea) 09/17/2012   Osteopenia    Plantar fasciitis 09/09/2019    Past Surgical History:  Procedure Laterality Date   CESAREAN SECTION     COLONOSCOPY  10/29/2013   Colonic polyp status post polypectomy. Mild sigmoid diverticulosis. Small internal hemorrhoids   FOOT NEUROMA SURGERY     s/p surgery in both feet   FOOT SURGERY Bilateral    Per patient   MENISCUS REPAIR Left 09/13/2016   miniscus repair right  Right 2019   SHOULDER SURGERY Left    arthroscopy.    TUBAL LIGATION       Family History  Problem Relation Age of Onset   Hypertension Mother    Atrial fibrillation Mother    CAD Mother    Congestive Heart Failure Mother    CVA Father    Hypertension Brother    Hypertension Son    Colon cancer Neg Hx    Esophageal cancer Neg Hx     Social History   Socioeconomic History   Marital status: Married    Spouse name: Transport planner   Number of children: 2   Years of education: Not on file   Highest education level: Not on file  Occupational History   Not on file  Tobacco Use   Smoking status: Never   Smokeless tobacco: Never  Vaping Use   Vaping Use: Never used  Substance and Sexual Activity   Alcohol use: No   Drug use: No   Sexual activity: Not Currently  Other Topics Concern   Not on file  Social History Narrative   Lives with husband   Right handed   Caffeine: 1 cup of tea and 1 pepsi in the AM   Social Determinants of Health   Financial Resource Strain: Not on file  Food Insecurity: No Food Insecurity   Worried About Running Out of Food in the Last Year: Never true  Ran Out of Food in the Last Year: Never true  Transportation Needs: No Transportation Needs   Lack of Transportation (Medical): No   Lack of Transportation (Non-Medical): No  Physical Activity: Not on file  Stress: Not on file  Social Connections: Not on file  Intimate Partner Violence: Not At Risk   Fear of Current or Ex-Partner: No   Emotionally Abused: No   Physically Abused: No   Sexually Abused: No    Outpatient Medications Prior to Visit  Medication Sig Dispense Refill   Cholecalciferol (VITAMIN D3) 50 MCG (2000 UT) TABS Take 1 tablet by mouth daily.     citalopram (CELEXA) 10 MG tablet TAKE 1 TABLET(10 MG) BY MOUTH DAILY 90 tablet 0   furosemide (LASIX) 20 MG tablet TAKE 1 TABLET(20 MG) BY MOUTH DAILY (Patient taking differently: Take 20 mg by mouth daily. Taking 3 times weekly. [place], [place]. And Friday.) 90 tablet 1   linaclotide (LINZESS) 290 MCG CAPS  capsule Take 290 mcg by mouth daily before breakfast.     metoprolol succinate (TOPROL-XL) 50 MG 24 hr tablet Take 1.5 tablets (75 mg total) by mouth daily. Take with or immediately following a meal. 90 tablet 1   montelukast (SINGULAIR) 10 MG tablet TAKE 1 TABLET(10 MG) BY MOUTH AT BEDTIME 90 tablet 1   olmesartan-hydrochlorothiazide (BENICAR HCT) 40-12.5 MG tablet TAKE 1 TABLET BY MOUTH DAILY 90 tablet 1   pantoprazole (PROTONIX) 40 MG tablet TAKE 1 TABLET BY MOUTH DAILY 90 tablet 1   Polyethyl Glycol-Propyl Glycol (SYSTANE OP) Place 1 drop into both eyes 4 (four) times daily.     simvastatin (ZOCOR) 40 MG tablet TAKE 1 TABLET BY MOUTH DAILY AT BEDTIME 90 tablet 0   prednisoLONE acetate (PRED FORTE) 1 % ophthalmic suspension SMARTSIG:In Eye(s)     No facility-administered medications prior to visit.    Allergies  Allergen Reactions   Zyrtec [Cetirizine]     Weird dreams.     Review of Systems  Constitutional:  Negative for chills, fatigue and fever.  HENT:  Negative for congestion, rhinorrhea and sore throat.   Respiratory:  Negative for cough and shortness of breath.   Cardiovascular:  Negative for chest pain.  Gastrointestinal:  Positive for constipation and diarrhea. Negative for abdominal pain and vomiting.  Genitourinary:  Negative for dysuria and urgency.  Musculoskeletal:  Positive for back pain. Negative for myalgias.  Neurological:  Negative for dizziness, weakness, light-headedness and headaches.  Psychiatric/Behavioral:  Negative for dysphoric mood. The patient is not nervous/anxious.       Objective:    Physical Exam Vitals reviewed.  Constitutional:      Appearance: Normal appearance. She is obese.  Neck:     Vascular: No carotid bruit.  Cardiovascular:     Rate and Rhythm: Normal rate and regular rhythm.     Heart sounds: Normal heart sounds.  Pulmonary:     Effort: Pulmonary effort is normal. No respiratory distress.     Breath sounds: Normal breath sounds.   Abdominal:     General: Abdomen is flat. Bowel sounds are normal.     Palpations: Abdomen is soft.     Tenderness: There is no abdominal tenderness.  Neurological:     Mental Status: She is alert and oriented to person, place, and time.  Psychiatric:        Mood and Affect: Mood normal.        Behavior: Behavior normal.    Temp (!) 96.7 F (35.9 C)  Resp 16    Wt 204 lb 9.6 oz (92.8 kg)    BMI 37.42 kg/m  Wt Readings from Last 3 Encounters:  02/09/21 204 lb 9.6 oz (92.8 kg)  01/20/21 207 lb 6.4 oz (94.1 kg)  01/04/21 206 lb (93.4 kg)   Orthostatic VS for the past 72 hrs (Last 3 readings):  Orthostatic BP Patient Position BP Location Cuff Size Orthostatic Pulse  02/09/21 1011 106/64 Standing Left Arm -- 60  02/09/21 1010 100/72 Sitting Left Arm -- 72  02/09/21 1009 90/60 Supine Left Arm Large 60  02/09/21 0859 110/60 -- -- -- 60     Health Maintenance Due  Topic Date Due   Zoster Vaccines- Shingrix (1 of 2) Never done   MAMMOGRAM  02/09/2021    There are no preventive care reminders to display for this patient.   Lab Results  Component Value Date   TSH 1.280 09/27/2020   Lab Results  Component Value Date   WBC 6.0 01/04/2021   HGB 13.9 01/04/2021   HCT 41.1 01/04/2021   MCV 90 01/04/2021   PLT 285 01/04/2021   Lab Results  Component Value Date   NA 132 (L) 01/04/2021   K 4.3 01/04/2021   CO2 25 01/04/2021   GLUCOSE 107 (H) 01/04/2021   BUN 13 01/04/2021   CREATININE 1.14 (H) 01/04/2021   BILITOT 0.6 01/04/2021   ALKPHOS 81 01/04/2021   AST 23 01/04/2021   ALT 21 01/04/2021   PROT 7.4 01/04/2021   ALBUMIN 4.7 01/04/2021   CALCIUM 9.7 01/04/2021   ANIONGAP 12 04/17/2017   EGFR 51 (L) 01/04/2021   GFR 55.96 (L) 11/25/2019   Lab Results  Component Value Date   CHOL 171 01/04/2021   Lab Results  Component Value Date   HDL 47 01/04/2021   Lab Results  Component Value Date   LDLCALC 97 01/04/2021   Lab Results  Component Value Date   TRIG  152 (H) 01/04/2021   Lab Results  Component Value Date   CHOLHDL 3.6 01/04/2021   Lab Results  Component Value Date   HGBA1C 6.1 (H) 01/04/2021       Assessment & Plan:   Problem List Items Addressed This Visit       Cardiovascular and Mediastinum   Benign hypertension    Bp is little low normal. This in combination with vasovagal may be contributing to syncope. Recommend decrease metoprolol xl to 50 mg daily.  Check labs.       Relevant Orders   TSH     Other   Vasovagal syncope    Likely main cause.  Discussed getting up slowly. Patient has understanding of vasovagal syncope.  Recommendations as above.       Syncope - Primary    multifactorial      Relevant Orders   CBC with Differential/Platelet   Comprehensive metabolic panel   TSH   Dizziness    Check labs      Relevant Orders   CBC with Differential/Platelet   Comprehensive metabolic panel   TSH   No orders of the defined types were placed in this encounter.   Orders Placed This Encounter  Procedures   CBC with Differential/Platelet   Comprehensive metabolic panel   TSH    Total time spent on today's visit was greater than 30 minutes, including both face-to-face time and nonface-to-face time personally spent on review of chart (labs and imaging), discussing labs and goals, discussing further work-up, treatment options, referrals  to specialist if needed, reviewing outside records of pertinent, answering patient's questions, and coordinating care.  Follow-up: Return in about 2 weeks (around 02/23/2021) for chronic follow up.  An After Visit Summary was printed and given to the patient.  Rochel Brome, MD [facility] [phone]

## 2021-02-10 LAB — CBC WITH DIFFERENTIAL/PLATELET
Basophils Absolute: 0 10*3/uL (ref 0.0–0.2)
Basos: 1 %
EOS (ABSOLUTE): 0.1 10*3/uL (ref 0.0–0.4)
Eos: 1 %
Hematocrit: 38.6 % (ref 34.0–46.6)
Hemoglobin: 13.1 g/dL (ref 11.1–15.9)
Immature Grans (Abs): 0 10*3/uL (ref 0.0–0.1)
Immature Granulocytes: 0 %
Lymphocytes Absolute: 1.6 10*3/uL (ref 0.7–3.1)
Lymphs: 24 %
MCH: 29.8 pg (ref 26.6–33.0)
MCHC: 33.9 g/dL (ref 31.5–35.7)
MCV: 88 fL (ref 79–97)
Monocytes Absolute: 0.5 10*3/uL (ref 0.1–0.9)
Monocytes: 8 %
Neutrophils Absolute: 4.5 10*3/uL (ref 1.4–7.0)
Neutrophils: 66 %
Platelets: 290 10*3/uL (ref 150–450)
RBC: 4.4 x10E6/uL (ref 3.77–5.28)
RDW: 12.2 % (ref 11.7–15.4)
WBC: 6.8 10*3/uL (ref 3.4–10.8)

## 2021-02-10 LAB — COMPREHENSIVE METABOLIC PANEL
ALT: 18 IU/L (ref 0–32)
AST: 21 IU/L (ref 0–40)
Albumin/Globulin Ratio: 2.1 (ref 1.2–2.2)
Albumin: 4.8 g/dL — ABNORMAL HIGH (ref 3.7–4.7)
Alkaline Phosphatase: 82 IU/L (ref 44–121)
BUN/Creatinine Ratio: 12 (ref 12–28)
BUN: 15 mg/dL (ref 8–27)
Bilirubin Total: 0.5 mg/dL (ref 0.0–1.2)
CO2: 24 mmol/L (ref 20–29)
Calcium: 10 mg/dL (ref 8.7–10.3)
Chloride: 94 mmol/L — ABNORMAL LOW (ref 96–106)
Creatinine, Ser: 1.28 mg/dL — ABNORMAL HIGH (ref 0.57–1.00)
Globulin, Total: 2.3 g/dL (ref 1.5–4.5)
Glucose: 97 mg/dL (ref 70–99)
Potassium: 4.1 mmol/L (ref 3.5–5.2)
Sodium: 133 mmol/L — ABNORMAL LOW (ref 134–144)
Total Protein: 7.1 g/dL (ref 6.0–8.5)
eGFR: 44 mL/min/{1.73_m2} — ABNORMAL LOW (ref >59)

## 2021-02-10 LAB — TSH: TSH: 1.81 u[IU]/mL (ref 0.450–4.500)

## 2021-02-11 ENCOUNTER — Other Ambulatory Visit: Payer: Self-pay | Admitting: Family Medicine

## 2021-02-11 MED ORDER — AMITRIPTYLINE HCL 25 MG PO TABS: 25.0000 mg | ORAL_TABLET | Freq: Every day | ORAL | 1 refills | Status: DC

## 2021-02-14 NOTE — Progress Notes (Signed)
Blood count normal.  Liver function normal.  Kidney function abnormal. Worsened. No nsaids. Push fluids. Follow up in 1 week for CMP.  Thyroid function normal.

## 2021-02-16 ENCOUNTER — Ambulatory Visit: Payer: Medicare Other

## 2021-02-16 ENCOUNTER — Other Ambulatory Visit: Payer: Self-pay

## 2021-02-16 DIAGNOSIS — N289 Disorder of kidney and ureter, unspecified: Secondary | ICD-10-CM | POA: Diagnosis not present

## 2021-02-16 DIAGNOSIS — R55 Syncope and collapse: Secondary | ICD-10-CM

## 2021-02-16 DIAGNOSIS — I10 Essential (primary) hypertension: Secondary | ICD-10-CM

## 2021-02-17 LAB — COMPREHENSIVE METABOLIC PANEL
ALT: 21 IU/L (ref 0–32)
AST: 28 IU/L (ref 0–40)
Albumin/Globulin Ratio: 1.8 (ref 1.2–2.2)
Albumin: 4.7 g/dL (ref 3.7–4.7)
Alkaline Phosphatase: 80 IU/L (ref 44–121)
BUN/Creatinine Ratio: 13 (ref 12–28)
BUN: 14 mg/dL (ref 8–27)
Bilirubin Total: 0.3 mg/dL (ref 0.0–1.2)
CO2: 25 mmol/L (ref 20–29)
Calcium: 9.6 mg/dL (ref 8.7–10.3)
Chloride: 94 mmol/L — ABNORMAL LOW (ref 96–106)
Creatinine, Ser: 1.04 mg/dL — ABNORMAL HIGH (ref 0.57–1.00)
Globulin, Total: 2.6 g/dL (ref 1.5–4.5)
Glucose: 81 mg/dL (ref 70–99)
Potassium: 4.3 mmol/L (ref 3.5–5.2)
Sodium: 133 mmol/L — ABNORMAL LOW (ref 134–144)
Total Protein: 7.3 g/dL (ref 6.0–8.5)
eGFR: 56 mL/min/{1.73_m2} — ABNORMAL LOW (ref >59)

## 2021-02-19 ENCOUNTER — Other Ambulatory Visit: Payer: Self-pay | Admitting: Family Medicine

## 2021-02-19 DIAGNOSIS — I110 Hypertensive heart disease with heart failure: Secondary | ICD-10-CM

## 2021-02-23 ENCOUNTER — Encounter: Payer: Self-pay | Admitting: Nurse Practitioner

## 2021-02-23 ENCOUNTER — Other Ambulatory Visit: Payer: Self-pay

## 2021-02-23 ENCOUNTER — Ambulatory Visit (INDEPENDENT_AMBULATORY_CARE_PROVIDER_SITE_OTHER): Payer: Medicare Other | Admitting: Nurse Practitioner

## 2021-02-23 VITALS — BP 134/72 | HR 71 | Temp 96.5°F | Ht 62.0 in | Wt 206.0 lb

## 2021-02-23 DIAGNOSIS — Z87898 Personal history of other specified conditions: Secondary | ICD-10-CM

## 2021-02-23 NOTE — Progress Notes (Signed)
hy  Subjective:  Patient ID: Diana Hendrix, female    DOB: 08-Sep-1945  Age: 76 y.o. MRN: [id number]  Chief Complaint  Patient presents with   2 Week Follow-up    Syncope     HPI  Diana Hendrix is a 76 year old Caucasian female that presents for follow-up after a vasovagal syncopal episode. She tells me that she was experiencing abdominal cramps when event occurred on 02/09/21. She was seen by PCP, antihypertensive medications adjusted. Decreased metoprolol to 50 mg QD. She has brought BP log reveals normotensive readings with her today.She denies any further syncopal episodes, lightheadedness, dizziness, or headaches.    She recently weaned herself off of Citalopram due to daytime fatigue.Denies side effect of withdrawal.   Current Outpatient Medications on File Prior to Visit  Medication Sig Dispense Refill   amitriptyline (ELAVIL) 25 MG tablet Take 1 tablet (25 mg total) by mouth at bedtime. 90 tablet 1   Cholecalciferol (VITAMIN D3) 50 MCG (2000 UT) TABS Take 1 tablet by mouth daily.     citalopram (CELEXA) 10 MG tablet TAKE 1 TABLET(10 MG) BY MOUTH DAILY 90 tablet 0   furosemide (LASIX) 20 MG tablet TAKE 1 TABLET(20 MG) BY MOUTH DAILY (Patient taking differently: Take 20 mg by mouth daily. Taking 3 times weekly. [place], [place]. And Friday.) 90 tablet 1   linaclotide (LINZESS) 290 MCG CAPS capsule Take 290 mcg by mouth daily before breakfast.     metoprolol succinate (TOPROL-XL) 50 MG 24 hr tablet TAKE 1 AND 1/2 TABLETS(75 MG) BY MOUTH DAILY WITH OR IMMEDIATELY FOLLOWING A MEAL 90 tablet 1   montelukast (SINGULAIR) 10 MG tablet TAKE 1 TABLET(10 MG) BY MOUTH AT BEDTIME 90 tablet 1   olmesartan-hydrochlorothiazide (BENICAR HCT) 40-12.5 MG tablet TAKE 1 TABLET BY MOUTH DAILY 90 tablet 1   pantoprazole (PROTONIX) 40 MG tablet TAKE 1 TABLET BY MOUTH DAILY 90 tablet 1   Polyethyl Glycol-Propyl Glycol (SYSTANE OP) Place 1 drop into both eyes 4 (four) times daily.     simvastatin (ZOCOR) 40 MG tablet TAKE  1 TABLET BY MOUTH DAILY AT BEDTIME 90 tablet 0   No current facility-administered medications on file prior to visit.   Past Medical History:  Diagnosis Date   Cancer ([place])    Diastolic heart failure (HCC)    Echo EF 60/65% Heart monitor was normal   GERD (gastroesophageal reflux disease)    History of colon polyps    Hyperlipidemia    Hypertension    Macular edema, cystoid    OSA (obstructive sleep apnea) 09/17/2012   Osteopenia    Plantar fasciitis 09/09/2019   Past Surgical History:  Procedure Laterality Date   CESAREAN SECTION     COLONOSCOPY  10/29/2013   Colonic polyp status post polypectomy. Mild sigmoid diverticulosis. Small internal hemorrhoids   FOOT NEUROMA SURGERY     s/p surgery in both feet   FOOT SURGERY Bilateral    Per patient   MENISCUS REPAIR Left 09/13/2016   miniscus repair right  Right 2019   SHOULDER SURGERY Left    arthroscopy.    TUBAL LIGATION      Family History  Problem Relation Age of Onset   Hypertension Mother    Atrial fibrillation Mother    CAD Mother    Congestive Heart Failure Mother    CVA Father    Hypertension Brother    Hypertension Son    Colon cancer Neg Hx    Esophageal cancer Neg Hx    Social  History   Socioeconomic History   Marital status: Married    Spouse name: Transport planner   Number of children: 2   Years of education: Not on file   Highest education level: Not on file  Occupational History   Not on file  Tobacco Use   Smoking status: Never   Smokeless tobacco: Never  Vaping Use   Vaping Use: Never used  Substance and Sexual Activity   Alcohol use: No   Drug use: No   Sexual activity: Not Currently  Other Topics Concern   Not on file  Social History Narrative   Lives with husband   Right handed   Caffeine: 1 cup of tea and 1 pepsi in the AM   Social Determinants of Health   Financial Resource Strain: Not on file  Food Insecurity: No Food Insecurity   Worried About Charity fundraiser in the Last Year:  Never true   [place] in the Last Year: Never true  Transportation Needs: No Transportation Needs   Lack of Transportation (Medical): No   Lack of Transportation (Non-Medical): No  Physical Activity: Not on file  Stress: Not on file  Social Connections: Not on file    Review of Systems  Constitutional:  Negative for chills, fatigue and fever.  HENT:  Negative for congestion, ear pain, rhinorrhea and sore throat.   Respiratory:  Negative for cough and shortness of breath.   Cardiovascular:  Negative for chest pain.  Gastrointestinal:  Negative for abdominal pain, constipation, diarrhea, nausea and vomiting.  Genitourinary:  Negative for dysuria and urgency.  Musculoskeletal:  Negative for back pain and myalgias.  Neurological:  Negative for dizziness, weakness, light-headedness and headaches.  Psychiatric/Behavioral:  Negative for dysphoric mood. The patient is not nervous/anxious.     Objective:  BP 134/72    Pulse 71    Temp (!) 96.5 F (35.8 C)    Ht 5\' 2"  (1.575 m)    Wt 206 lb (93.4 kg)    SpO2 95%    BMI 37.68 kg/m    BP/Weight 02/09/2021 01/20/2021 94/49/6759  Systolic BP - 163 846  Diastolic BP - 60 62  Wt. (Lbs) 204.6 207.4 206  BMI 37.42 37.93 37.68    Physical Exam Vitals reviewed.  Constitutional:      Appearance: Normal appearance.  HENT:     Head: Normocephalic.     Right Ear: Tympanic membrane normal.     Left Ear: Tympanic membrane normal.     Nose: Nose normal.     Mouth/Throat:     Mouth: Mucous membranes are moist.  Cardiovascular:     Rate and Rhythm: Normal rate and regular rhythm.     Pulses: Normal pulses.     Heart sounds: Normal heart sounds.  Pulmonary:     Effort: Pulmonary effort is normal.     Breath sounds: Normal breath sounds.  Abdominal:     General: Bowel sounds are normal.     Palpations: Abdomen is soft.  Skin:    General: Skin is warm and dry.     Capillary Refill: Capillary refill takes less than 2 seconds.   Neurological:     General: No focal deficit present.     Mental Status: She is alert and oriented to person, place, and time.  Psychiatric:        Mood and Affect: Mood normal.        Behavior: Behavior normal.  Lab Results  Component Value Date   WBC 6.8 02/09/2021   HGB 13.1 02/09/2021   HCT 38.6 02/09/2021   PLT 290 02/09/2021   GLUCOSE 81 02/16/2021   CHOL 171 01/04/2021   TRIG 152 (H) 01/04/2021   HDL 47 01/04/2021   LDLCALC 97 01/04/2021   ALT 21 02/16/2021   AST 28 02/16/2021   NA 133 (L) 02/16/2021   K 4.3 02/16/2021   CL 94 (L) 02/16/2021   CREATININE 1.04 (H) 02/16/2021   BUN 14 02/16/2021   CO2 25 02/16/2021   TSH 1.810 02/09/2021   HGBA1C 6.1 (H) 01/04/2021      Assessment & Plan:   1. History of syncope   Continue medications as prescribed Follow-up as needed      Follow-up: Follow-up as needed  An After Visit Summary was printed and given to the patient.  I, Rip Harbour, NP, have reviewed all documentation for this visit. The documentation on 02/23/21 for the exam, diagnosis, procedures, and orders are all accurate and complete.     Signed, Rip Harbour, NP [place] [phone]

## 2021-02-23 NOTE — Patient Instructions (Addendum)
Continue medications as prescribed Follow-up as needed    Syncope, Adult Syncope is when you pass out or faint for a short time. It is caused by a sudden decrease in blood flow to the brain. This can happen for many reasons. It can sometimes happen when seeing blood, getting a shot (injection), or having pain or strong emotions. Most causes of fainting are not dangerous, but in some cases it can be a sign of a serious medical problem. If you faint, get help right away. Call your local emergency services (911 in the [place] Follow these instructions at home: Watch for any changes in your symptoms. Take these actions to stay safe and help with your symptoms: Knowing when you may be about to faint Signs that you may be about to faint include: Feeling dizzy or light-headed. It may feel like the room is spinning. Feeling weak. Feeling like you may vomit (nauseous). Seeing spots or seeing all white or all black. Having cold, clammy skin. Feeling warm and sweaty. Hearing ringing in the ears. If you start to feel like you might faint, sit or lie down right away. If sitting, lower your head down between your legs. If lying down, raise (elevate) your feet above the level of your heart. Breathe deeply and steadily. Wait until all of the symptoms are gone. Have someone stay with you until you feel better. Medicines Take over-the-counter and prescription medicines only as told by your doctor. If you are taking blood pressure or heart medicine, sit up and stand up slowly. Spend a few minutes getting ready to sit and then stand. This can help you feel less dizzy. Lifestyle Do not drive, use machinery, or play sports until your doctor says it is okay. Do not drink alcohol. Do not smoke or use any products that contain nicotine or tobacco. If you need help quitting, ask your doctor. Avoid hot tubs and saunas. General instructions Talk with your doctor about your symptoms. You may need to have testing to  help find the cause. Drink enough fluid to keep your pee (urine) pale yellow. Avoid standing for a long time. If you must stand for a long time, do movements such as: Moving your legs. Crossing your legs. Flexing and stretching your leg muscles. Squatting. Keep all follow-up visits. Contact a doctor if: You have episodes of near fainting. Get help right away if: You pass out or faint. You hit your head or are injured after fainting. You have any of these symptoms: Fast or uneven heartbeats (palpitations). Pain in your chest, belly, or back. Shortness of breath. You have jerky movements that you cannot control (seizure). You have a very bad headache. You are confused. You have problems with how you see (vision). You are very weak. You have trouble walking. You are bleeding from your mouth or your butt (rectum). You have black or tarry poop (stool). These symptoms may be an emergency. Get help right away. Call your local emergency services (911 in the [place] Do not wait to see if the symptoms will go away. Do not drive yourself to the hospital. Summary Syncope is when you pass out or faint for a short time. It is caused by a sudden decrease in blood flow to the brain. Signs that you may be about to faint include feeling dizzy or light-headed, feeling like you may vomit, seeing all white or all black, or having cold, clammy skin. If you start to feel like you might faint, sit or lie down right  away. Lower your head if sitting, or raise (elevate) your feet if lying down. Breathe deeply and steadily. Wait until all of the symptoms are gone. This information is not intended to replace advice given to you by your health care provider. Make sure you discuss any questions you have with your health care provider. Document Revised: 05/13/2020 Document Reviewed: 05/13/2020 Elsevier Patient Education  2022 [employer].

## 2021-04-02 ENCOUNTER — Other Ambulatory Visit: Payer: Self-pay | Admitting: Family Medicine

## 2021-04-05 DIAGNOSIS — Z1231 Encounter for screening mammogram for malignant neoplasm of breast: Secondary | ICD-10-CM | POA: Diagnosis not present

## 2021-04-05 LAB — HM MAMMOGRAPHY

## 2021-04-12 ENCOUNTER — Encounter: Payer: Self-pay | Admitting: Family Medicine

## 2021-04-12 ENCOUNTER — Ambulatory Visit (INDEPENDENT_AMBULATORY_CARE_PROVIDER_SITE_OTHER): Payer: Medicare Other | Admitting: Family Medicine

## 2021-04-12 ENCOUNTER — Other Ambulatory Visit: Payer: Self-pay

## 2021-04-12 VITALS — BP 92/60 | HR 78 | Temp 96.9°F | Resp 16 | Ht 62.0 in | Wt 205.0 lb

## 2021-04-12 DIAGNOSIS — G4733 Obstructive sleep apnea (adult) (pediatric): Secondary | ICD-10-CM

## 2021-04-12 DIAGNOSIS — E782 Mixed hyperlipidemia: Secondary | ICD-10-CM

## 2021-04-12 DIAGNOSIS — I110 Hypertensive heart disease with heart failure: Secondary | ICD-10-CM

## 2021-04-12 DIAGNOSIS — R7301 Impaired fasting glucose: Secondary | ICD-10-CM

## 2021-04-12 DIAGNOSIS — K219 Gastro-esophageal reflux disease without esophagitis: Secondary | ICD-10-CM | POA: Diagnosis not present

## 2021-04-12 DIAGNOSIS — I5032 Chronic diastolic (congestive) heart failure: Secondary | ICD-10-CM | POA: Diagnosis not present

## 2021-04-12 MED ORDER — FAMOTIDINE 40 MG PO TABS: 40.0000 mg | ORAL_TABLET | Freq: Every day | ORAL | 3 refills | Status: DC

## 2021-04-12 MED ORDER — METOPROLOL SUCCINATE ER 50 MG PO TB24: 25.0000 mg | ORAL_TABLET | Freq: Every day | ORAL | 0 refills | Status: DC

## 2021-04-12 NOTE — Progress Notes (Signed)
? ?Subjective:  ?Patient ID: Diana Hendrix, female    DOB: November 06, 1945  Age: 76 y.o. MRN: [id number] ? ?Chief Complaint  ?Patient presents with  ? Hypertension  ? Prediabetes  ? Hyperlipidemia  ? ? ?HPI ?Hypertension: on toprol xl 50 mg daily. I decreased to one pill daily from 1 1/2 pill daily. ?Also on benicar hct40 - 12.5. mg once daily. No swelling since furosemide 20 mg daily. BP usually runs 100-120/60-80s. ? ?IBS-C on linzess 290 mg onc edialy. ? ?GERD: protonix 40 mg one orally daily. Patient tried to come off of it and had heartburn.  ? ?Hyperlipidemia: zocor 40 mg once daily.  ? ?Depression/anxiety: on celexa 10 mg once daily. Started years ago. On amitriptyline 25 mg before bed for insomnia. It is working very well.  ? ?Allergic rhinitis. On singulair 10 mg before bed. Taking mucinex otc once daily as needed.  ? ?OSA: on cpap machine. Sleeps much better. Does not do well if does not wear it.  ? ?Current Outpatient Medications on File Prior to Visit  ?Medication Sig Dispense Refill  ? amitriptyline (ELAVIL) 25 MG tablet Take 1 tablet (25 mg total) by mouth at bedtime. 90 tablet 1  ? Cholecalciferol (VITAMIN D3) 50 MCG (2000 UT) TABS Take 1 tablet by mouth daily.    ? furosemide (LASIX) 20 MG tablet TAKE 1 TABLET(20 MG) BY MOUTH DAILY (Patient taking differently: Take 20 mg by mouth daily.) 90 tablet 1  ? linaclotide (LINZESS) 290 MCG CAPS capsule Take 290 mcg by mouth daily before breakfast.    ? montelukast (SINGULAIR) 10 MG tablet TAKE 1 TABLET(10 MG) BY MOUTH AT BEDTIME 90 tablet 1  ? olmesartan-hydrochlorothiazide (BENICAR HCT) 40-12.5 MG tablet TAKE 1 TABLET BY MOUTH DAILY 90 tablet 1  ? Polyethyl Glycol-Propyl Glycol (SYSTANE OP) Place 1 drop into both eyes 4 (four) times daily.    ? simvastatin (ZOCOR) 40 MG tablet TAKE 1 TABLET BY MOUTH DAILY AT BEDTIME 90 tablet 0  ? ?No current facility-administered medications on file prior to visit.  ? ?Past Medical History:  ?Diagnosis Date  ? Cancer [facility])    ? Diastolic heart failure ([place])   ? Echo EF 60/65% Heart monitor was normal  ? GERD (gastroesophageal reflux disease)   ? History of colon polyps   ? Hyperlipidemia   ? Hypertension   ? Macular edema, cystoid   ? OSA (obstructive sleep apnea) 09/17/2012  ? Osteopenia   ? Plantar fasciitis 09/09/2019  ? ?Past Surgical History:  ?Procedure Laterality Date  ? CESAREAN SECTION    ? COLONOSCOPY  10/29/2013  ? Colonic polyp status post polypectomy. Mild sigmoid diverticulosis. Small internal hemorrhoids  ? FOOT NEUROMA SURGERY    ? s/p surgery in both feet  ? FOOT SURGERY Bilateral   ? Per patient  ? MENISCUS REPAIR Left 09/13/2016  ? miniscus repair right  Right 2019  ? SHOULDER SURGERY Left   ? arthroscopy.   ? TUBAL LIGATION    ?  ?Family History  ?Problem Relation Age of Onset  ? Hyperlipidemia Mother   ? Heart disease Mother   ? Hypertension Mother   ? Atrial fibrillation Mother   ? CAD Mother   ? Congestive Heart Failure Mother   ? CVA Father   ? Hypertension Brother   ? Hypertension Son   ? Colon cancer Neg Hx   ? Esophageal cancer Neg Hx   ? ?Social History  ? ?Socioeconomic History  ? Marital status: Married  ?  Spouse name: Koleen Celia  ? Number of children: 2  ? Years of education: Not on file  ? Highest education level: Not on file  ?Occupational History  ? Not on file  ?Tobacco Use  ? Smoking status: Never  ? Smokeless tobacco: Never  ?Vaping Use  ? Vaping Use: Never used  ?Substance and Sexual Activity  ? Alcohol use: No  ? Drug use: No  ? Sexual activity: Not Currently  ?Other Topics Concern  ? Not on file  ?Social History Narrative  ? Lives with husband  ? Right handed  ? Caffeine: 1 cup of tea and 1 pepsi in the AM  ? ?Social Determinants of Health  ? ?Financial Resource Strain: Not on file  ?Food Insecurity: No Food Insecurity  ? Worried About Charity fundraiser in the Last Year: Never true  ? Ran Out of Food in the Last Year: Never true  ?Transportation Needs: No Transportation Needs  ? Lack of  Transportation (Medical): No  ? Lack of Transportation (Non-Medical): No  ?Physical Activity: Not on file  ?Stress: Not on file  ?Social Connections: Not on file  ? ? ?Review of Systems  ?Constitutional:  Negative for chills, fatigue and fever.  ?HENT:  Positive for congestion. Negative for rhinorrhea and sore throat.   ?Respiratory:  Positive for cough. Negative for shortness of breath.   ?Cardiovascular:  Negative for chest pain.  ?Gastrointestinal:  Positive for constipation (controlled w/Linzess). Negative for abdominal pain, diarrhea, nausea and vomiting.  ?Endocrine: Negative for polydipsia and polyphagia.  ?Genitourinary:  Negative for dysuria and urgency.  ?Musculoskeletal:  Negative for back pain and myalgias.  ?Neurological:  Negative for dizziness, weakness, light-headedness and headaches.  ?Psychiatric/Behavioral:  Negative for dysphoric mood and sleep disturbance. The patient is not nervous/anxious.   ? ? ?Objective:  ?BP 92/60   Pulse 78   Temp (!) 96.9 ?F (36.1 ?C)   Resp 16   Ht '5\' 2"'$  (1.575 m)   Wt 205 lb (93 kg)   BMI 37.49 kg/m?  ? ? ?  04/12/2021  ?  9:33 AM 02/23/2021  ?  9:28 AM 02/09/2021  ?  8:59 AM  ?BP/Weight  ?Systolic BP 92 706   ?Diastolic BP 60 72   ?Wt. (Lbs) 205 206 204.6  ?BMI 37.49 kg/m2 37.68 kg/m2 37.42 kg/m2  ? ? ?Physical Exam ?Vitals reviewed.  ?Constitutional:   ?   Appearance: Normal appearance. She is obese.  ?Neck:  ?   Vascular: No carotid bruit.  ?Cardiovascular:  ?   Rate and Rhythm: Normal rate and regular rhythm.  ?   Heart sounds: Normal heart sounds.  ?Pulmonary:  ?   Effort: Pulmonary effort is normal. No respiratory distress.  ?   Breath sounds: Normal breath sounds.  ?Abdominal:  ?   General: Abdomen is flat. Bowel sounds are normal.  ?   Palpations: Abdomen is soft.  ?   Tenderness: There is no abdominal tenderness.  ?Neurological:  ?   Mental Status: She is alert and oriented to person, place, and time.  ?Psychiatric:     ?   Mood and Affect: Mood normal.      ?   Behavior: Behavior normal.  ? ? ?Diabetic Foot Exam - Simple   ?No data filed ?  ?  ? ?Lab Results  ?Component Value Date  ? WBC 7.6 04/12/2021  ? HGB 13.5 04/12/2021  ? HCT 40.0 04/12/2021  ? PLT 298 04/12/2021  ? GLUCOSE  100 (H) 04/12/2021  ? CHOL 174 04/12/2021  ? TRIG 191 (H) 04/12/2021  ? HDL 43 04/12/2021  ? [place] 98 04/12/2021  ? ALT 15 04/12/2021  ? AST 22 04/12/2021  ? NA 131 (L) 04/12/2021  ? K 4.2 04/12/2021  ? CL 92 (L) 04/12/2021  ? CREATININE 1.10 (H) 04/12/2021  ? BUN 11 04/12/2021  ? CO2 25 04/12/2021  ? TSH 1.810 02/09/2021  ? HGBA1C 6.2 (H) 04/12/2021  ? ? ? ? ?Assessment & Plan:  ? ?Problem List Items Addressed This Visit   ? ?  ? Cardiovascular and Mediastinum  ? Hypertensive heart disease with chronic diastolic congestive heart failure ([place]) - Primary  ?  Bp is low normal. I do not believe patient needs all bp meds. I will gradually decrease and discontinue bp meds.Toprol xl 50 mg 1/2 daily.  ?Also on benicar hct 40/12.5. mg once daily. ?Call back with bp log and heart rate in 2 weeks.  ?Recommend continue to work on eating healthy diet and exercise. ? ?  ?  ? Relevant Medications  ? metoprolol succinate (TOPROL-XL) 50 MG 24 hr tablet  ?  ? Respiratory  ? OSA (obstructive sleep apnea)  ?  CPAP. ?Management per specialist. ? ?  ?  ?  ? Digestive  ? GERD (gastroesophageal reflux disease)  ?  Stop protonix. Start on pepcid 40 mg once daily. ?  ?  ? Relevant Medications  ? famotidine (PEPCID) 40 MG tablet  ?  ? Endocrine  ? Impaired fasting blood sugar  ?  Recommend continue to work on eating healthy diet and exercise. ? ?  ?  ? Relevant Orders  ? CBC with Differential/Platelet (Completed)  ? Comprehensive metabolic panel (Completed)  ? Hemoglobin A1c (Completed)  ?  ? Other  ? Hyperlipidemia  ?  Well controlled.  ?No changes to medicines. zocor 40 mg once daily.  ?Continue to work on eating a healthy diet and exercise.  ?Labs drawn today.  ?  ?  ? Relevant Medications  ? metoprolol succinate  (TOPROL-XL) 50 MG 24 hr tablet  ? Other Relevant Orders  ? Lipid panel (Completed)  ?. ? ?Meds ordered this encounter  ?Medications  ? metoprolol succinate (TOPROL-XL) 50 MG 24 hr tablet  ?  Sig: Take 0.5 tablets (25

## 2021-04-12 NOTE — Patient Instructions (Addendum)
GERD: Stop protonix. Start on pepcid 40 mg once daily. ?Anxiety: Stop citalopram.  ?Hypertension: ?Toprol xl 50 mg 1/2 daily.  ?Call back with bp log and heart rate in 2 weeks.  ?Recommend continue to work on eating healthy diet and exercise. ? ? ?

## 2021-04-13 LAB — COMPREHENSIVE METABOLIC PANEL
ALT: 15 IU/L (ref 0–32)
AST: 22 IU/L (ref 0–40)
Albumin/Globulin Ratio: 1.4 (ref 1.2–2.2)
Albumin: 4.3 g/dL (ref 3.7–4.7)
Alkaline Phosphatase: 95 IU/L (ref 44–121)
BUN/Creatinine Ratio: 10 — ABNORMAL LOW (ref 12–28)
BUN: 11 mg/dL (ref 8–27)
Bilirubin Total: 0.6 mg/dL (ref 0.0–1.2)
CO2: 25 mmol/L (ref 20–29)
Calcium: 9.8 mg/dL (ref 8.7–10.3)
Chloride: 92 mmol/L — ABNORMAL LOW (ref 96–106)
Creatinine, Ser: 1.1 mg/dL — ABNORMAL HIGH (ref 0.57–1.00)
Globulin, Total: 3.1 g/dL (ref 1.5–4.5)
Glucose: 100 mg/dL — ABNORMAL HIGH (ref 70–99)
Potassium: 4.2 mmol/L (ref 3.5–5.2)
Sodium: 131 mmol/L — ABNORMAL LOW (ref 134–144)
Total Protein: 7.4 g/dL (ref 6.0–8.5)
eGFR: 53 mL/min/{1.73_m2} — ABNORMAL LOW (ref >59)

## 2021-04-13 LAB — CBC WITH DIFFERENTIAL/PLATELET
Basophils Absolute: 0.1 10*3/uL (ref 0.0–0.2)
Basos: 1 %
EOS (ABSOLUTE): 0.2 10*3/uL (ref 0.0–0.4)
Eos: 3 %
Hematocrit: 40 % (ref 34.0–46.6)
Hemoglobin: 13.5 g/dL (ref 11.1–15.9)
Immature Grans (Abs): 0 10*3/uL (ref 0.0–0.1)
Immature Granulocytes: 0 %
Lymphocytes Absolute: 2 10*3/uL (ref 0.7–3.1)
Lymphs: 26 %
MCH: 30.1 pg (ref 26.6–33.0)
MCHC: 33.8 g/dL (ref 31.5–35.7)
MCV: 89 fL (ref 79–97)
Monocytes Absolute: 0.7 10*3/uL (ref 0.1–0.9)
Monocytes: 9 %
Neutrophils Absolute: 4.7 10*3/uL (ref 1.4–7.0)
Neutrophils: 61 %
Platelets: 298 10*3/uL (ref 150–450)
RBC: 4.48 x10E6/uL (ref 3.77–5.28)
RDW: 12.8 % (ref 11.7–15.4)
WBC: 7.6 10*3/uL (ref 3.4–10.8)

## 2021-04-13 LAB — HEMOGLOBIN A1C
Est. average glucose Bld gHb Est-mCnc: 131 mg/dL
Hgb A1c MFr Bld: 6.2 % — ABNORMAL HIGH (ref 4.8–5.6)

## 2021-04-13 LAB — LIPID PANEL
Chol/HDL Ratio: 4 ratio (ref 0.0–4.4)
Cholesterol, Total: 174 mg/dL (ref 100–199)
HDL: 43 mg/dL (ref >39)
LDL Chol Calc (NIH): 98 mg/dL (ref 0–99)
Triglycerides: 191 mg/dL — ABNORMAL HIGH (ref 0–149)
VLDL Cholesterol Cal: 33 mg/dL (ref 5–40)

## 2021-04-13 LAB — CARDIOVASCULAR RISK ASSESSMENT

## 2021-04-13 NOTE — Progress Notes (Signed)
Blood count normal.  ?Liver function normal.  ?Kidney function abnormal, but stable ?Sodium little low. Recommend to not avoid salt.  ?Cholesterol: trigs elevated 191. Recommend add fish oil lovaza 1 gm 2 capsules twice daily.  ?HBA1C: 6.2 good.  ?

## 2021-04-14 ENCOUNTER — Encounter: Payer: Self-pay | Admitting: Family Medicine

## 2021-04-14 ENCOUNTER — Other Ambulatory Visit: Payer: Self-pay

## 2021-04-14 MED ORDER — OMEGA-3-ACID ETHYL ESTERS 1 G PO CAPS: 1.0000 g | ORAL_CAPSULE | Freq: Two times a day (BID) | ORAL | 3 refills | Status: DC

## 2021-04-15 ENCOUNTER — Encounter: Payer: Self-pay | Admitting: Family Medicine

## 2021-04-15 MED ORDER — OMEGA-3-ACID ETHYL ESTERS 1 G PO CAPS: 2.0000 g | ORAL_CAPSULE | Freq: Two times a day (BID) | ORAL | 0 refills | Status: DC

## 2021-04-26 ENCOUNTER — Encounter: Payer: Self-pay | Admitting: Family Medicine

## 2021-04-28 ENCOUNTER — Encounter: Payer: Self-pay | Admitting: Family Medicine

## 2021-04-29 NOTE — Assessment & Plan Note (Addendum)
Stop protonix. Start on pepcid 40 mg once daily. ?

## 2021-04-29 NOTE — Assessment & Plan Note (Addendum)
Bp is low normal. I do not believe patient needs all bp meds. I will gradually decrease and discontinue bp meds.Toprol xl 50 mg 1/2 daily.  ?Also on benicar hct 40/12.5. mg once daily. ?Call back with bp log and heart rate in 2 weeks.  ?Recommend continue to work on eating healthy diet and exercise. ? ?

## 2021-04-29 NOTE — Assessment & Plan Note (Signed)
Recommend continue to work on eating healthy diet and exercise.  

## 2021-04-29 NOTE — Assessment & Plan Note (Signed)
Well controlled.  ?No changes to medicines. zocor 40 mg once daily.  ?Continue to work on eating a healthy diet and exercise.  ?Labs drawn today.  ?

## 2021-04-29 NOTE — Assessment & Plan Note (Signed)
CPAP. ?Management per specialist. ?

## 2021-04-29 NOTE — Assessment & Plan Note (Signed)
>>  ASSESSMENT AND PLAN FOR IMPAIRED FASTING BLOOD SUGAR WRITTEN ON 04/29/2021 10:11 PM BY LEAL-BORJAS,  I, CMA  Recommend continue to work on eating healthy diet and exercise.

## 2021-05-06 DIAGNOSIS — Z20822 Contact with and (suspected) exposure to covid-19: Secondary | ICD-10-CM | POA: Diagnosis not present

## 2021-05-07 ENCOUNTER — Other Ambulatory Visit: Payer: Self-pay | Admitting: Physician Assistant

## 2021-05-09 ENCOUNTER — Encounter: Payer: Self-pay | Admitting: Family Medicine

## 2021-05-10 ENCOUNTER — Other Ambulatory Visit: Payer: Self-pay

## 2021-05-10 MED ORDER — ROSUVASTATIN CALCIUM 40 MG PO TABS: 40.0000 mg | ORAL_TABLET | Freq: Every day | ORAL | 1 refills | Status: DC

## 2021-05-30 ENCOUNTER — Ambulatory Visit (INDEPENDENT_AMBULATORY_CARE_PROVIDER_SITE_OTHER): Payer: Medicare Other | Admitting: Family Medicine

## 2021-05-30 ENCOUNTER — Encounter: Payer: Self-pay | Admitting: Family Medicine

## 2021-05-30 VITALS — BP 98/52 | HR 82 | Temp 97.0°F | Ht 62.0 in | Wt 206.0 lb

## 2021-05-30 DIAGNOSIS — J208 Acute bronchitis due to other specified organisms: Secondary | ICD-10-CM | POA: Insufficient documentation

## 2021-05-30 DIAGNOSIS — I10 Essential (primary) hypertension: Secondary | ICD-10-CM

## 2021-05-30 MED ORDER — BENZONATATE 200 MG PO CAPS: 200.0000 mg | ORAL_CAPSULE | Freq: Three times a day (TID) | ORAL | 0 refills | Status: DC | PRN

## 2021-05-30 MED ORDER — AZITHROMYCIN 250 MG PO TABS: ORAL_TABLET | ORAL | 0 refills | Status: AC

## 2021-05-30 NOTE — Progress Notes (Addendum)
Acute Office Visit  Subjective:    Patient ID: Diana Hendrix, female    DOB: 1946/01/15, 76 y.o.   MRN: [id number]  Chief Complaint  Patient presents with   Cough    Chest congestion x 3 weeks    Cough Pertinent negatives include no chest pain, ear pain, fever, headaches, myalgias, rash, sore throat, shortness of breath or wheezing.  Patient is in today for chest congestion and coughing x 3 weeks. Took some amoxicillin she had gotten from her dentist. Evelina Bucy about 6 days worth. Tried mucinex which might have helped a little.  Bp is still low despite decreasing her metoprolol xl to 25 mg daily.   Past Medical History:  Diagnosis Date   Cancer ([place])    Diastolic heart failure ([facility])    Echo EF 60/65% Heart monitor was normal   GERD (gastroesophageal reflux disease)    History of colon polyps    Hyperlipidemia    Hypertension    Macular edema, cystoid    OSA (obstructive sleep apnea) 09/17/2012   Osteopenia    Plantar fasciitis 09/09/2019    Past Surgical History:  Procedure Laterality Date   CESAREAN SECTION     COLONOSCOPY  10/29/2013   Colonic polyp status post polypectomy. Mild sigmoid diverticulosis. Small internal hemorrhoids   FOOT NEUROMA SURGERY     s/p surgery in both feet   FOOT SURGERY Bilateral    Per patient   MENISCUS REPAIR Left 09/13/2016   miniscus repair right  Right 2019   SHOULDER SURGERY Left    arthroscopy.    TUBAL LIGATION      Family History  Problem Relation Age of Onset   Hyperlipidemia Mother    Heart disease Mother    Hypertension Mother    Atrial fibrillation Mother    CAD Mother    Congestive Heart Failure Mother    CVA Father    Hypertension Brother    Hypertension Son    Colon cancer Neg Hx    Esophageal cancer Neg Hx     Social History   Socioeconomic History   Marital status: Married    Spouse name: Transport planner   Number of children: 2   Years of education: Not on file   Highest education level: Not on file   Occupational History   Not on file  Tobacco Use   Smoking status: Never   Smokeless tobacco: Never  Vaping Use   Vaping Use: Never used  Substance and Sexual Activity   Alcohol use: No   Drug use: No   Sexual activity: Not Currently  Other Topics Concern   Not on file  Social History Narrative   Lives with husband   Right handed   Caffeine: 1 cup of tea and 1 pepsi in the AM   Social Determinants of Health   Financial Resource Strain: Not on file  Food Insecurity: No Food Insecurity   Worried About Charity fundraiser in the Last Year: Never true   [place] in the Last Year: Never true  Transportation Needs: No Transportation Needs   Lack of Transportation (Medical): No   Lack of Transportation (Non-Medical): No  Physical Activity: Not on file  Stress: Not on file  Social Connections: Not on file  Intimate Partner Violence: Not At Risk   Fear of Current or Ex-Partner: No   Emotionally Abused: No   Physically Abused: No   Sexually Abused: No    Outpatient Medications Prior  to Visit  Medication Sig Dispense Refill   amitriptyline (ELAVIL) 25 MG tablet Take 1 tablet (25 mg total) by mouth at bedtime. 90 tablet 1   Cholecalciferol (VITAMIN D3) 50 MCG (2000 UT) TABS Take 1 tablet by mouth daily.     famotidine (PEPCID) 40 MG tablet Take 1 tablet (40 mg total) by mouth daily. 90 tablet 3   furosemide (LASIX) 20 MG tablet TAKE 1 TABLET(20 MG) BY MOUTH DAILY (Patient taking differently: Take 20 mg by mouth daily.) 90 tablet 1   LINZESS 290 MCG CAPS capsule TAKE 1 CAPSULE(290 MCG) BY MOUTH DAILY BEFORE AND BREAKFAST 90 capsule 1   metoprolol succinate (TOPROL-XL) 50 MG 24 hr tablet Take 0.5 tablets (25 mg total) by mouth daily. Take with or immediately following a meal. 1 tablet 0   montelukast (SINGULAIR) 10 MG tablet TAKE 1 TABLET(10 MG) BY MOUTH AT BEDTIME 90 tablet 1   olmesartan-hydrochlorothiazide (BENICAR HCT) 40-12.5 MG tablet TAKE 1 TABLET BY MOUTH DAILY 90  tablet 1   omega-3 acid ethyl esters (LOVAZA) 1 g capsule Take 2 capsules (2 g total) by mouth 2 (two) times daily. 360 capsule 0   Polyethyl Glycol-Propyl Glycol (SYSTANE OP) Place 1 drop into both eyes 4 (four) times daily.     rosuvastatin (CRESTOR) 40 MG tablet Take 1 tablet (40 mg total) by mouth daily. 90 tablet 1   No facility-administered medications prior to visit.    Allergies  Allergen Reactions   Zyrtec [Cetirizine]     Weird dreams.     Review of Systems  Constitutional:  Negative for appetite change, fatigue and fever.  HENT:  Positive for congestion (Chest congestion). Negative for ear pain, sinus pressure and sore throat.   Respiratory:  Positive for cough. Negative for chest tightness, shortness of breath and wheezing.   Cardiovascular:  Negative for chest pain and palpitations.  Gastrointestinal:  Negative for abdominal pain, constipation, diarrhea, nausea and vomiting.  Genitourinary:  Negative for dysuria and hematuria.  Musculoskeletal:  Negative for arthralgias, back pain, joint swelling and myalgias.  Skin:  Negative for rash.  Neurological:  Negative for dizziness, weakness and headaches.  Psychiatric/Behavioral:  Negative for dysphoric mood. The patient is not nervous/anxious.       Objective:    Physical Exam Vitals reviewed.  Constitutional:      Appearance: Normal appearance.  HENT:     Right Ear: Tympanic membrane, ear canal and external ear normal.     Left Ear: Tympanic membrane, ear canal and external ear normal.     Nose: Nose normal.     Mouth/Throat:     Pharynx: Oropharynx is clear.  Cardiovascular:     Rate and Rhythm: Normal rate and regular rhythm.     Heart sounds: Normal heart sounds. No murmur heard. Pulmonary:     Effort: Pulmonary effort is normal. No respiratory distress.     Breath sounds: Normal breath sounds.  Lymphadenopathy:     Cervical: No cervical adenopathy.  Neurological:     Mental Status: She is alert and  oriented to person, place, and time.  Psychiatric:        Mood and Affect: Mood normal.        Behavior: Behavior normal.    BP (!) 98/52 (BP Location: Left Arm, Patient Position: Sitting)   Pulse 82   Temp (!) 97 F (36.1 C) (Temporal)   Ht '5\' 2"'  (1.575 m)   Wt 206 lb (93.4 kg)   SpO2  98%   BMI 37.68 kg/m  Wt Readings from Last 3 Encounters:  05/30/21 206 lb (93.4 kg)  04/12/21 205 lb (93 kg)  02/23/21 206 lb (93.4 kg)    Health Maintenance Due  Topic Date Due   Zoster Vaccines- Shingrix (2 of 2) 04/16/2021    There are no preventive care reminders to display for this patient.   Lab Results  Component Value Date   TSH 1.810 02/09/2021   Lab Results  Component Value Date   WBC 7.6 04/12/2021   HGB 13.5 04/12/2021   HCT 40.0 04/12/2021   MCV 89 04/12/2021   PLT 298 04/12/2021   Lab Results  Component Value Date   NA 131 (L) 04/12/2021   K 4.2 04/12/2021   CO2 25 04/12/2021   GLUCOSE 100 (H) 04/12/2021   BUN 11 04/12/2021   CREATININE 1.10 (H) 04/12/2021   BILITOT 0.6 04/12/2021   ALKPHOS 95 04/12/2021   AST 22 04/12/2021   ALT 15 04/12/2021   PROT 7.4 04/12/2021   ALBUMIN 4.3 04/12/2021   CALCIUM 9.8 04/12/2021   ANIONGAP 12 04/17/2017   EGFR 53 (L) 04/12/2021   GFR 55.96 (L) 11/25/2019   Lab Results  Component Value Date   CHOL 174 04/12/2021   Lab Results  Component Value Date   HDL 43 04/12/2021   Lab Results  Component Value Date   LDLCALC 98 04/12/2021   Lab Results  Component Value Date   TRIG 191 (H) 04/12/2021   Lab Results  Component Value Date   CHOLHDL 4.0 04/12/2021   Lab Results  Component Value Date   HGBA1C 6.2 (H) 04/12/2021         Assessment & Plan:   Problem List Items Addressed This Visit       Cardiovascular and Mediastinum   Primary hypertension - Primary    Patient has continued to have low normal bps despite decreasing toprol. Will stop toprol xl.  Continue benicar/hct 40/12.5 mg once daily.  Check  bp daily. Bring or call in with bp log results.          Respiratory   Acute bronchitis due to other specified organisms    Z pack sent.  Tessalon perles 200 mg three times a day as needed cough.           Meds ordered this encounter  Medications   azithromycin (ZITHROMAX) 250 MG tablet    Sig: Take 2 tablets on day 1, then 1 tablet daily on days 2 through 5    Dispense:  6 tablet    Refill:  0   benzonatate (TESSALON) 200 MG capsule    Sig: Take 1 capsule (200 mg total) by mouth 3 (three) times daily as needed for cough.    Dispense:  30 capsule    Refill:  0    I,Lauren M Auman,acting as a scribe for Rochel Brome, MD.,have documented all relevant documentation on the behalf of Rochel Brome, MD,as directed by  Rochel Brome, MD while in the presence of Rochel Brome, MD.   Rochel Brome, MD

## 2021-05-30 NOTE — Patient Instructions (Addendum)
Stop toprol xl.  ?Continue benicar/hct 40/12.5 mg once daily.  ?Check bp daily. Bring or call in with bp log results. ? ? ?Z pack sent.  ?Tessalon perles 200 mg three times a day as needed cough.  ? ? ?

## 2021-05-30 NOTE — Assessment & Plan Note (Addendum)
Patient has continued to have low normal bps despite decreasing toprol. Will stop toprol xl.  ?Continue benicar/hct 40/12.5 mg once daily.  ?Check bp daily. Bring or call in with bp log results. ? ? ?

## 2021-05-30 NOTE — Assessment & Plan Note (Signed)
Z pack sent.  ?Tessalon perles 200 mg three times a day as needed cough.  ? ? ?

## 2021-06-06 ENCOUNTER — Encounter: Payer: Self-pay | Admitting: Family Medicine

## 2021-06-20 ENCOUNTER — Encounter: Payer: Self-pay | Admitting: Family Medicine

## 2021-06-28 ENCOUNTER — Ambulatory Visit (INDEPENDENT_AMBULATORY_CARE_PROVIDER_SITE_OTHER): Payer: Medicare Other | Admitting: Family Medicine

## 2021-06-28 ENCOUNTER — Encounter: Payer: Self-pay | Admitting: Family Medicine

## 2021-06-28 VITALS — BP 102/60 | HR 71 | Temp 97.7°F | Resp 18 | Ht 62.0 in | Wt 207.5 lb

## 2021-06-28 DIAGNOSIS — I10 Essential (primary) hypertension: Secondary | ICD-10-CM

## 2021-06-28 DIAGNOSIS — F330 Major depressive disorder, recurrent, mild: Secondary | ICD-10-CM

## 2021-06-28 DIAGNOSIS — E782 Mixed hyperlipidemia: Secondary | ICD-10-CM | POA: Diagnosis not present

## 2021-06-28 NOTE — Progress Notes (Addendum)
Subjective:  Patient ID: Diana Hendrix, female    DOB: 1945/05/25  Age: 76 y.o. MRN: [id number]  Chief Complaint  Patient presents with   Hypertension    Follow up   Depression    HPI Patient in office for blood pressure follow up. She is currently on lower dose of toprol xl 25 mg daily. Continued benicar 40/12.5 mg one-half tablet daily. Feels like her legs are weak.  Blood pressures 111-120 over 60s.  Pulse 70 to 80s.  Patient restarted citalopram 10 mg about two weeks ago. Feels it has been helpful since restart.     06/28/2021    1:37 PM 04/12/2021    9:39 AM 02/09/2021    8:50 AM  PHQ9 SCORE ONLY  PHQ-9 Total Score 3 0 0   Unable to take fish oil. Caused gi upset. I change Zocor to Crestor.  Patient due for lipid panel and fasting blood work in July.  Current Outpatient Medications on File Prior to Visit  Medication Sig Dispense Refill   amitriptyline (ELAVIL) 25 MG tablet Take 1 tablet (25 mg total) by mouth at bedtime. 90 tablet 1   benzonatate (TESSALON) 200 MG capsule Take 1 capsule (200 mg total) by mouth 3 (three) times daily as needed for cough. 30 capsule 0   Cholecalciferol (VITAMIN D3) 50 MCG (2000 UT) TABS Take 1 tablet by mouth daily.     citalopram (CELEXA) 10 MG tablet Take 10 mg by mouth daily.     famotidine (PEPCID) 40 MG tablet Take 1 tablet (40 mg total) by mouth daily. 90 tablet 3   furosemide (LASIX) 20 MG tablet TAKE 1 TABLET(20 MG) BY MOUTH DAILY (Patient taking differently: Take 20 mg by mouth daily.) 90 tablet 1   LINZESS 290 MCG CAPS capsule TAKE 1 CAPSULE(290 MCG) BY MOUTH DAILY BEFORE AND BREAKFAST 90 capsule 1   metoprolol succinate (TOPROL-XL) 50 MG 24 hr tablet Take 0.5 tablets (25 mg total) by mouth daily. Take with or immediately following a meal. 1 tablet 0   montelukast (SINGULAIR) 10 MG tablet TAKE 1 TABLET(10 MG) BY MOUTH AT BEDTIME 90 tablet 1   olmesartan-hydrochlorothiazide (BENICAR HCT) 40-12.5 MG tablet TAKE 1 TABLET BY MOUTH DAILY  90 tablet 1   Polyethyl Glycol-Propyl Glycol (SYSTANE OP) Place 1 drop into both eyes 4 (four) times daily.     rosuvastatin (CRESTOR) 40 MG tablet Take 1 tablet (40 mg total) by mouth daily. 90 tablet 1   No current facility-administered medications on file prior to visit.   Past Medical History:  Diagnosis Date   Cancer ([place])    Diastolic heart failure (HCC)    Echo EF 60/65% Heart monitor was normal   GERD (gastroesophageal reflux disease)    History of colon polyps    Hyperlipidemia    Hypertension    Macular edema, cystoid    OSA (obstructive sleep apnea) 09/17/2012   Osteopenia    Plantar fasciitis 09/09/2019   Past Surgical History:  Procedure Laterality Date   CESAREAN SECTION     COLONOSCOPY  10/29/2013   Colonic polyp status post polypectomy. Mild sigmoid diverticulosis. Small internal hemorrhoids   FOOT NEUROMA SURGERY     s/p surgery in both feet   FOOT SURGERY Bilateral    Per patient   MENISCUS REPAIR Left 09/13/2016   miniscus repair right  Right 2019   SHOULDER SURGERY Left    arthroscopy.    TUBAL LIGATION      Family History  Problem Relation Age of Onset   Hyperlipidemia Mother    Heart disease Mother    Hypertension Mother    Atrial fibrillation Mother    CAD Mother    Congestive Heart Failure Mother    CVA Father    Hypertension Brother    Hypertension Son    Colon cancer Neg Hx    Esophageal cancer Neg Hx    Social History   Socioeconomic History   Marital status: Married    Spouse name: Transport planner   Number of children: 2   Years of education: Not on file   Highest education level: Not on file  Occupational History   Not on file  Tobacco Use   Smoking status: Never   Smokeless tobacco: Never  Vaping Use   Vaping Use: Never used  Substance and Sexual Activity   Alcohol use: No   Drug use: No   Sexual activity: Not Currently  Other Topics Concern   Not on file  Social History Narrative   Lives with husband   Right handed    Caffeine: 1 cup of tea and 1 pepsi in the AM   Social Determinants of Health   Financial Resource Strain: Low Risk  (05/14/2018)   Overall Financial Resource Strain (CARDIA)    Difficulty of Paying Living Expenses: Not hard at all  Food Insecurity: No Food Insecurity (01/20/2021)   Hunger Vital Sign    Worried About Running Out of Food in the Last Year: Never true    Ran Out of Food in the Last Year: Never true  Transportation Needs: No Transportation Needs (01/20/2021)   PRAPARE - Hydrologist (Medical): No    Lack of Transportation (Non-Medical): No  Physical Activity: Sufficiently Active (05/14/2018)   Exercise Vital Sign    Days of Exercise per Week: 5 days    Minutes of Exercise per Session: 40 min  Stress: Not on file  Social Connections: Socially Integrated (05/14/2018)   Social Connection and Isolation Panel [NHANES]    Frequency of Communication with Friends and Family: More than three times a week    Frequency of Social Gatherings with Friends and Family: More than three times a week    Attends Religious Services: 1 to 4 times per year    Active Member of [employer] or Organizations: Yes    Attends Archivist Meetings: 1 to 4 times per year    Marital Status: Married    Review of Systems  Constitutional:  Negative for appetite change, fatigue and fever.  HENT:  Negative for congestion, ear pain, sinus pressure and sore throat.   Eyes:  Negative for pain.  Respiratory:  Positive for cough and chest tightness. Negative for shortness of breath and wheezing.   Cardiovascular:  Negative for chest pain and palpitations.  Gastrointestinal:  Negative for abdominal pain, constipation, diarrhea, nausea and vomiting.  Genitourinary:  Negative for dysuria and frequency.  Musculoskeletal:  Negative for arthralgias, back pain, joint swelling and myalgias.  Skin:  Negative for rash.  Neurological:  Positive for weakness. Negative for dizziness and  headaches.  Psychiatric/Behavioral:  Negative for dysphoric mood. The patient is not nervous/anxious.      Objective:  BP 102/60   Pulse 71   Temp 97.7 F (36.5 C)   Resp 18   Ht '5\' 2"'$  (1.575 m)   Wt 207 lb 8 oz (94.1 kg)   SpO2 97%   BMI 37.95 kg/m  06/28/2021    1:39 PM 05/30/2021   10:50 AM 04/12/2021    9:33 AM  BP/Weight  Systolic BP 009 98 92  Diastolic BP 60 52 60  Wt. (Lbs) 207.5 206 205  BMI 37.95 kg/m2 37.68 kg/m2 37.49 kg/m2    Physical Exam Vitals reviewed.  Constitutional:      Appearance: Normal appearance. She is normal weight.  Neck:     Vascular: No carotid bruit.  Cardiovascular:     Rate and Rhythm: Normal rate and regular rhythm.     Heart sounds: Normal heart sounds.  Pulmonary:     Effort: Pulmonary effort is normal. No respiratory distress.     Breath sounds: Normal breath sounds.  Abdominal:     General: Abdomen is flat. Bowel sounds are normal.     Palpations: Abdomen is soft.     Tenderness: There is no abdominal tenderness.  Neurological:     Mental Status: She is alert and oriented to person, place, and time.  Psychiatric:        Mood and Affect: Mood normal.        Behavior: Behavior normal.     Diabetic Foot Exam - Simple   No data filed      Lab Results  Component Value Date   WBC 7.6 04/12/2021   HGB 13.5 04/12/2021   HCT 40.0 04/12/2021   PLT 298 04/12/2021   GLUCOSE 100 (H) 04/12/2021   CHOL 174 04/12/2021   TRIG 191 (H) 04/12/2021   HDL 43 04/12/2021   LDLCALC 98 04/12/2021   ALT 15 04/12/2021   AST 22 04/12/2021   NA 131 (L) 04/12/2021   K 4.2 04/12/2021   CL 92 (L) 04/12/2021   CREATININE 1.10 (H) 04/12/2021   BUN 11 04/12/2021   CO2 25 04/12/2021   TSH 1.810 02/09/2021   HGBA1C 6.2 (H) 04/12/2021      Assessment & Plan:   Problem List Items Addressed This Visit       Cardiovascular and Mediastinum   Primary hypertension - Primary    Continue Toprol-XL 25 mg daily. Continue Benicar/HCT  40/12.5 mg half pill daily.        Other   Mixed hyperlipidemia   Mild recurrent major depression ([place])    Well-controlled.  Continue citalopram 10 mg daily.      Relevant Medications   citalopram (CELEXA) 10 MG tablet  . Follow-up: Return in about 20 days (around 07/18/2021) for chronic fasting.  An After Visit Summary was printed and given to the patient.   Rochel Brome, MD [facility] [phone]

## 2021-06-28 NOTE — Assessment & Plan Note (Signed)
Well-controlled.  Continue citalopram 10 mg daily.

## 2021-06-28 NOTE — Assessment & Plan Note (Signed)
Continue Toprol-XL 25 mg daily. Continue Benicar/HCT 40/12.5 mg half pill daily.

## 2021-07-02 ENCOUNTER — Other Ambulatory Visit: Payer: Self-pay | Admitting: Family Medicine

## 2021-07-17 NOTE — Progress Notes (Addendum)
Subjective:  Patient ID: Diana Hendrix Needs, female    DOB: 28-Jun-1945  Age: 76 y.o. MRN: [id number]  Chief Complaint  Patient presents with   Hyperlipidemia   Hypertension   Sleep Apnea    HPI Hypertension: Patient is taking Metoprolol 50 mg 0.5 mg daily, Benicar 40-12.5 mg 1/2 daily, furosemide 20 mg daily. Checks bp once a day 114/78.)  Hyperlipidemia: She takes Rosuvastatin 40 mg daily.  Depression: Taking Citalopram 10 mg daily, amitriptyline 25 mg at bedtime.    06/28/2021    1:37 PM 04/12/2021    9:39 AM 02/09/2021    8:50 AM  PHQ9 SCORE ONLY  PHQ-9 Total Score 3 0 0    Constipation: Currently taking Linzess 290 mcg every morning. Works fairly well, but has some constipation. Uses miralax as needed.   OSA: She is using CPAP every night. Patient sees Dr. Halford Chessman.  Complaining of cough in am for 2 hours, but at night she feels soreness of chest when lays on her side.   Current Outpatient Medications on File Prior to Visit  Medication Sig Dispense Refill   amitriptyline (ELAVIL) 25 MG tablet Take 1 tablet (25 mg total) by mouth at bedtime. 90 tablet 1   Cholecalciferol (VITAMIN D3) 50 MCG (2000 UT) TABS Take 1 tablet by mouth daily.     citalopram (CELEXA) 10 MG tablet TAKE 1 TABLET(10 MG) BY MOUTH DAILY 90 tablet 0   furosemide (LASIX) 20 MG tablet TAKE 1 TABLET(20 MG) BY MOUTH DAILY (Patient taking differently: Take 20 mg by mouth daily.) 90 tablet 1   LINZESS 290 MCG CAPS capsule TAKE 1 CAPSULE(290 MCG) BY MOUTH DAILY BEFORE AND BREAKFAST 90 capsule 1   metoprolol succinate (TOPROL-XL) 50 MG 24 hr tablet Take 0.5 tablets (25 mg total) by mouth daily. Take with or immediately following a meal. 1 tablet 0   NON FORMULARY CPAP.     Polyethyl Glycol-Propyl Glycol (SYSTANE OP) Place 1 drop into both eyes 4 (four) times daily.     rosuvastatin (CRESTOR) 40 MG tablet Take 1 tablet (40 mg total) by mouth daily. 90 tablet 1   No current facility-administered medications on file  prior to visit.   Past Medical History:  Diagnosis Date   Cancer ([place])    Diastolic heart failure (HCC)    Echo EF 60/65% Heart monitor was normal   GERD (gastroesophageal reflux disease)    History of colon polyps    Hyperlipidemia    Hypertension    Macular edema, cystoid    OSA (obstructive sleep apnea) 09/17/2012   Osteopenia    Plantar fasciitis 09/09/2019   Past Surgical History:  Procedure Laterality Date   CESAREAN SECTION     COLONOSCOPY  10/29/2013   Colonic polyp status post polypectomy. Mild sigmoid diverticulosis. Small internal hemorrhoids   FOOT NEUROMA SURGERY     s/p surgery in both feet   FOOT SURGERY Bilateral    Per patient   MENISCUS REPAIR Left 09/13/2016   miniscus repair right  Right 2019   SHOULDER SURGERY Left    arthroscopy.    TUBAL LIGATION      Family History  Problem Relation Age of Onset   Hyperlipidemia Mother    Heart disease Mother    Hypertension Mother    Atrial fibrillation Mother    CAD Mother    Congestive Heart Failure Mother    CVA Father    Hypertension Brother    Hypertension Son    Colon  cancer Neg Hx    Esophageal cancer Neg Hx    Social History   Socioeconomic History   Marital status: Married    Spouse name: Transport planner   Number of children: 2   Years of education: Not on file   Highest education level: Not on file  Occupational History   Not on file  Tobacco Use   Smoking status: Never   Smokeless tobacco: Never  Vaping Use   Vaping Use: Never used  Substance and Sexual Activity   Alcohol use: No   Drug use: No   Sexual activity: Not Currently  Other Topics Concern   Not on file  Social History Narrative   Lives with husband   Right handed   Caffeine: 1 cup of tea and 1 pepsi in the AM   Social Determinants of Health   Financial Resource Strain: Low Risk  (05/14/2018)   Overall Financial Resource Strain (CARDIA)    Difficulty of Paying Living Expenses: Not hard at all  Food Insecurity: No Food  Insecurity (01/20/2021)   Hunger Vital Sign    Worried About Running Out of Food in the Last Year: Never true    Ran Out of Food in the Last Year: Never true  Transportation Needs: No Transportation Needs (01/20/2021)   PRAPARE - Hydrologist (Medical): No    Lack of Transportation (Non-Medical): No  Physical Activity: Sufficiently Active (05/14/2018)   Exercise Vital Sign    Days of Exercise per Week: 5 days    Minutes of Exercise per Session: 40 min  Stress: Not on file  Social Connections: Socially Integrated (05/14/2018)   Social Connection and Isolation Panel [NHANES]    Frequency of Communication with Friends and Family: More than three times a week    Frequency of Social Gatherings with Friends and Family: More than three times a week    Attends Religious Services: 1 to 4 times per year    Active Member of [employer] or Organizations: Yes    Attends Archivist Meetings: 1 to 4 times per year    Marital Status: Married    Review of Systems  Constitutional:  Negative for chills, fatigue and fever.  HENT:  Negative for congestion, ear pain and sore throat.   Respiratory:  Negative for cough and shortness of breath.   Cardiovascular:  Negative for chest pain and palpitations.  Gastrointestinal:  Negative for abdominal pain, constipation, diarrhea, nausea and vomiting.       Acid Reflux  Endocrine: Negative for polydipsia, polyphagia and polyuria.  Genitourinary:  Negative for difficulty urinating and dysuria.  Musculoskeletal:  Negative for arthralgias, back pain and myalgias.  Skin:  Negative for rash.  Neurological:  Negative for headaches.  Psychiatric/Behavioral:  Negative for dysphoric mood. The patient is not nervous/anxious.      Objective:  BP 100/60   Pulse 77   Temp 98.6 F (37 C)   Resp 15   Ht '5\' 2"'$  (1.575 m)   Wt 206 lb (93.4 kg)   SpO2 94%   BMI 37.68 kg/m      07/18/2021    9:13 AM 06/28/2021    1:39 PM 05/30/2021   10:50  AM  BP/Weight  Systolic BP 678 938 98  Diastolic BP 60 60 52  Wt. (Lbs) 206 207.5 206  BMI 37.68 kg/m2 37.95 kg/m2 37.68 kg/m2    Physical Exam Vitals reviewed.  Constitutional:      Appearance: Normal appearance.  She is normal weight.  HENT:     Nose: No congestion or rhinorrhea.  Neck:     Vascular: No carotid bruit.  Cardiovascular:     Rate and Rhythm: Normal rate and regular rhythm.     Heart sounds: Normal heart sounds.  Pulmonary:     Effort: Pulmonary effort is normal. No respiratory distress.     Breath sounds: Normal breath sounds.  Abdominal:     General: Abdomen is flat. Bowel sounds are normal.     Palpations: Abdomen is soft.     Tenderness: There is no abdominal tenderness.  Neurological:     Mental Status: She is alert and oriented to person, place, and time.  Psychiatric:        Mood and Affect: Mood normal.        Behavior: Behavior normal.     Diabetic Foot Exam - Simple   No data filed      Lab Results  Component Value Date   WBC 7.6 04/12/2021   HGB 13.5 04/12/2021   HCT 40.0 04/12/2021   PLT 298 04/12/2021   GLUCOSE 100 (H) 04/12/2021   CHOL 174 04/12/2021   TRIG 191 (H) 04/12/2021   HDL 43 04/12/2021   LDLCALC 98 04/12/2021   ALT 15 04/12/2021   AST 22 04/12/2021   NA 131 (L) 04/12/2021   K 4.2 04/12/2021   CL 92 (L) 04/12/2021   CREATININE 1.10 (H) 04/12/2021   BUN 11 04/12/2021   CO2 25 04/12/2021   TSH 1.810 02/09/2021   HGBA1C 6.2 (H) 04/12/2021      Assessment & Plan:   Problem List Items Addressed This Visit       Cardiovascular and Mediastinum   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  Continue current medications.  Metoprolol 50 mg half pill daily.  Patient has a lot of the pills left.  We will continue letter split.  New prescription for Benicar/HCT 20/12.5 mg once daily sent.  Patient has been splitting the 40/12.5 mg pill.       Relevant Medications   olmesartan-hydrochlorothiazide (BENICAR HCT) 20-12.5 MG tablet   Other Relevant Orders   Comprehensive metabolic panel   CBC with Differential/Platelet     Respiratory   OSA (obstructive sleep apnea)    Recommend patient call specialist to discuss congestion following use of CPAP.      Allergic rhinitis    Continue continue Singulair.      Relevant Medications   montelukast (SINGULAIR) 10 MG tablet     Digestive   GERD (gastroesophageal reflux disease)    Start omeprazole 40 mg daily.  New prescription sent.      Relevant Medications   omeprazole (PRILOSEC) 40 MG capsule   Chronic idiopathic constipation    Discussed gradually adding MiraLAX up to twice daily if needed to [place].  And then try to discontinue Linzess.        Endocrine   Impaired fasting blood sugar    Continue to work on low sugar diet and exercise.      Relevant Orders   Hemoglobin A1c     Musculoskeletal and Integument   Osteopenia    Continue calcium with vitamin D 1200 mg daily.        Other   Mixed hyperlipidemia    Well controlled.  No changes to medicines.  Continue to work on eating a healthy diet and exercise.  Labs drawn today.        Relevant Medications   olmesartan-hydrochlorothiazide (BENICAR HCT) 20-12.5 MG tablet   Other Relevant Orders   Lipid panel   Class 2 severe obesity due to excess calories with serious comorbidity and body mass index (BMI) of 37.0 to 37.9 in adult [facility])    Recommend continue to work on eating healthy diet and exercise. Recommend work on weight loss.       Mild recurrent major depression ([place])    Well-controlled.  Continue current medications.     .  Meds ordered this encounter  Medications   omeprazole (PRILOSEC) 40 MG capsule    Sig: Take 1 capsule (40 mg total) by mouth daily.    Dispense:  90 capsule    Refill:  3   montelukast (SINGULAIR) 10 MG tablet    Sig: TAKE 1 TABLET(10 MG) BY MOUTH AT BEDTIME     Dispense:  90 tablet    Refill:  3   olmesartan-hydrochlorothiazide (BENICAR HCT) 20-12.5 MG tablet    Sig: Take 1 tablet by mouth daily.    Dispense:  90 tablet    Refill:  1    Orders Placed This Encounter  Procedures   Comprehensive metabolic panel   Hemoglobin A1c   Lipid panel   CBC with Differential/Platelet     Follow-up: Return in about 3 months (around 10/18/2021) for chronic fasting.  An After Visit Summary was printed and given to the patient.  Rochel Brome, MD [facility] [phone]

## 2021-07-18 ENCOUNTER — Ambulatory Visit (INDEPENDENT_AMBULATORY_CARE_PROVIDER_SITE_OTHER): Payer: Medicare Other | Admitting: Family Medicine

## 2021-07-18 ENCOUNTER — Encounter: Payer: Self-pay | Admitting: Family Medicine

## 2021-07-18 VITALS — BP 100/60 | HR 77 | Temp 98.6°F | Resp 15 | Ht 62.0 in | Wt 206.0 lb

## 2021-07-18 DIAGNOSIS — Z6837 Body mass index (BMI) 37.0-37.9, adult: Secondary | ICD-10-CM | POA: Diagnosis not present

## 2021-07-18 DIAGNOSIS — G4733 Obstructive sleep apnea (adult) (pediatric): Secondary | ICD-10-CM | POA: Diagnosis not present

## 2021-07-18 DIAGNOSIS — I5032 Chronic diastolic (congestive) heart failure: Secondary | ICD-10-CM | POA: Diagnosis not present

## 2021-07-18 DIAGNOSIS — F330 Major depressive disorder, recurrent, mild: Secondary | ICD-10-CM

## 2021-07-18 DIAGNOSIS — R7301 Impaired fasting glucose: Secondary | ICD-10-CM

## 2021-07-18 DIAGNOSIS — E782 Mixed hyperlipidemia: Secondary | ICD-10-CM | POA: Diagnosis not present

## 2021-07-18 DIAGNOSIS — J301 Allergic rhinitis due to pollen: Secondary | ICD-10-CM | POA: Diagnosis not present

## 2021-07-18 DIAGNOSIS — G43109 Migraine with aura, not intractable, without status migrainosus: Secondary | ICD-10-CM

## 2021-07-18 DIAGNOSIS — K5904 Chronic idiopathic constipation: Secondary | ICD-10-CM

## 2021-07-18 DIAGNOSIS — I110 Hypertensive heart disease with heart failure: Secondary | ICD-10-CM | POA: Diagnosis not present

## 2021-07-18 DIAGNOSIS — M8580 Other specified disorders of bone density and structure, unspecified site: Secondary | ICD-10-CM | POA: Diagnosis not present

## 2021-07-18 DIAGNOSIS — K219 Gastro-esophageal reflux disease without esophagitis: Secondary | ICD-10-CM | POA: Diagnosis not present

## 2021-07-18 DIAGNOSIS — E66812 Obesity, class 2: Secondary | ICD-10-CM

## 2021-07-18 MED ORDER — OLMESARTAN MEDOXOMIL-HCTZ 20-12.5 MG PO TABS
1.0000 | ORAL_TABLET | Freq: Every day | ORAL | 1 refills | Status: DC
Start: 2021-07-18 — End: 2021-11-14

## 2021-07-18 MED ORDER — OMEPRAZOLE 40 MG PO CPDR: 40.0000 mg | DELAYED_RELEASE_CAPSULE | Freq: Every day | ORAL | 3 refills | Status: DC

## 2021-07-18 MED ORDER — MONTELUKAST SODIUM 10 MG PO TABS: ORAL_TABLET | ORAL | 3 refills | Status: DC

## 2021-07-18 NOTE — Assessment & Plan Note (Signed)
Recommend patient call specialist to discuss congestion following use of CPAP.

## 2021-07-18 NOTE — Assessment & Plan Note (Signed)
Blood pressure at goal.  Continue current medications.  Metoprolol 50 mg half pill daily.  Patient has a lot of the pills left.  We will continue letter split.  New prescription for Benicar/HCT 20/12.5 mg once daily sent.  Patient has been splitting the 40/12.5 mg pill.

## 2021-07-18 NOTE — Assessment & Plan Note (Signed)
Well controlled.  ?No changes to medicines.  ?Continue to work on eating a healthy diet and exercise.  ?Labs drawn today.  ?

## 2021-07-18 NOTE — Assessment & Plan Note (Signed)
Start omeprazole 40 mg daily.  New prescription sent.

## 2021-07-18 NOTE — Assessment & Plan Note (Signed)
Discussed gradually adding MiraLAX up to twice daily if needed to [place].  And then try to discontinue Linzess.

## 2021-07-18 NOTE — Assessment & Plan Note (Signed)
Continue continue Singulair.

## 2021-07-18 NOTE — Assessment & Plan Note (Signed)
Well controlled. Continue current medications  

## 2021-07-18 NOTE — Assessment & Plan Note (Signed)
Recommend continue to work on eating healthy diet and exercise. Recommend work on weight loss.

## 2021-07-18 NOTE — Assessment & Plan Note (Addendum)
Continue calcium with vitamin D 1200 mg daily.

## 2021-07-18 NOTE — Assessment & Plan Note (Signed)
Continue to work on low sugar diet and exercise.

## 2021-07-18 NOTE — Assessment & Plan Note (Signed)
>>  ASSESSMENT AND PLAN FOR IMPAIRED FASTING BLOOD SUGAR WRITTEN ON 07/18/2021  9:58 AM BY COX, KIRSTEN, MD  Continue to work on low sugar diet and exercise.

## 2021-07-18 NOTE — Addendum Note (Signed)
Addended byRochel Brome on: 07/18/2021 10:01 AM   Modules accepted: Orders

## 2021-07-19 LAB — COMPREHENSIVE METABOLIC PANEL
ALT: 23 IU/L (ref 0–32)
AST: 23 IU/L (ref 0–40)
Albumin/Globulin Ratio: 1.5 (ref 1.2–2.2)
Albumin: 4.4 g/dL (ref 3.7–4.7)
Alkaline Phosphatase: 84 IU/L (ref 44–121)
BUN/Creatinine Ratio: 14 (ref 12–28)
BUN: 16 mg/dL (ref 8–27)
Bilirubin Total: 0.4 mg/dL (ref 0.0–1.2)
CO2: 23 mmol/L (ref 20–29)
Calcium: 9.8 mg/dL (ref 8.7–10.3)
Chloride: 97 mmol/L (ref 96–106)
Creatinine, Ser: 1.16 mg/dL — ABNORMAL HIGH (ref 0.57–1.00)
Globulin, Total: 2.9 g/dL (ref 1.5–4.5)
Glucose: 98 mg/dL (ref 70–99)
Potassium: 4.3 mmol/L (ref 3.5–5.2)
Sodium: 135 mmol/L (ref 134–144)
Total Protein: 7.3 g/dL (ref 6.0–8.5)
eGFR: 49 mL/min/{1.73_m2} — ABNORMAL LOW (ref >59)

## 2021-07-19 LAB — LIPID PANEL
Chol/HDL Ratio: 3.4 ratio (ref 0.0–4.4)
Cholesterol, Total: 146 mg/dL (ref 100–199)
HDL: 43 mg/dL (ref >39)
LDL Chol Calc (NIH): 73 mg/dL (ref 0–99)
Triglycerides: 178 mg/dL — ABNORMAL HIGH (ref 0–149)
VLDL Cholesterol Cal: 30 mg/dL (ref 5–40)

## 2021-07-19 LAB — CBC WITH DIFFERENTIAL/PLATELET
Basophils Absolute: 0 10*3/uL (ref 0.0–0.2)
Basos: 1 %
EOS (ABSOLUTE): 0.3 10*3/uL (ref 0.0–0.4)
Eos: 5 %
Hematocrit: 38.3 % (ref 34.0–46.6)
Hemoglobin: 12.8 g/dL (ref 11.1–15.9)
Immature Grans (Abs): 0 10*3/uL (ref 0.0–0.1)
Immature Granulocytes: 0 %
Lymphocytes Absolute: 1.7 10*3/uL (ref 0.7–3.1)
Lymphs: 27 %
MCH: 29.8 pg (ref 26.6–33.0)
MCHC: 33.4 g/dL (ref 31.5–35.7)
MCV: 89 fL (ref 79–97)
Monocytes Absolute: 0.5 10*3/uL (ref 0.1–0.9)
Monocytes: 8 %
Neutrophils Absolute: 3.7 10*3/uL (ref 1.4–7.0)
Neutrophils: 59 %
Platelets: 261 10*3/uL (ref 150–450)
RBC: 4.29 x10E6/uL (ref 3.77–5.28)
RDW: 13.4 % (ref 11.7–15.4)
WBC: 6.3 10*3/uL (ref 3.4–10.8)

## 2021-07-19 LAB — CARDIOVASCULAR RISK ASSESSMENT

## 2021-07-19 LAB — HEMOGLOBIN A1C
Est. average glucose Bld gHb Est-mCnc: 134 mg/dL
Hgb A1c MFr Bld: 6.3 % — ABNORMAL HIGH (ref 4.8–5.6)

## 2021-07-19 NOTE — Progress Notes (Signed)
Blood count normal.  Liver function abnormal but stable .   Liver function normal.  Cholesterol: Triglycerides elevated to 178 though slightly improved.  Goal is less than 150.  Rest of cholesterol panel is good. HBA1C: 6.3.  Stable.

## 2021-07-20 ENCOUNTER — Encounter: Payer: Self-pay | Admitting: Pulmonary Disease

## 2021-07-20 DIAGNOSIS — Z9989 Dependence on other enabling machines and devices: Secondary | ICD-10-CM

## 2021-07-20 NOTE — Telephone Encounter (Signed)
Received the following message from patient:  "Could you please have [employer] lower the pressure on my Cpap machine. I have been having more congestion, tightness and soreness in my bronchial area for about two months. I made an appointment with you for August. Thanks."  90 day cpap compliance report has been printed and placed in Dr. Juanetta Gosling review folder. Dr. Halford Chessman, can you please advise once you get a chance? Thanks!

## 2021-07-20 NOTE — Telephone Encounter (Signed)
Please send order to have her auto CPAP changed to 5 to 7 cm H2O.

## 2021-08-06 ENCOUNTER — Other Ambulatory Visit: Payer: Self-pay | Admitting: Family Medicine

## 2021-09-06 ENCOUNTER — Encounter: Payer: Self-pay | Admitting: Pulmonary Disease

## 2021-09-06 ENCOUNTER — Ambulatory Visit (INDEPENDENT_AMBULATORY_CARE_PROVIDER_SITE_OTHER): Payer: Medicare Other

## 2021-09-06 ENCOUNTER — Ambulatory Visit (INDEPENDENT_AMBULATORY_CARE_PROVIDER_SITE_OTHER): Payer: Medicare Other | Admitting: Pulmonary Disease

## 2021-09-06 VITALS — BP 110/60 | HR 69 | Ht 62.0 in | Wt 208.0 lb

## 2021-09-06 DIAGNOSIS — R058 Other specified cough: Secondary | ICD-10-CM | POA: Diagnosis not present

## 2021-09-06 DIAGNOSIS — G4733 Obstructive sleep apnea (adult) (pediatric): Secondary | ICD-10-CM | POA: Diagnosis not present

## 2021-09-06 DIAGNOSIS — R0789 Other chest pain: Secondary | ICD-10-CM

## 2021-09-06 DIAGNOSIS — R079 Chest pain, unspecified: Secondary | ICD-10-CM | POA: Diagnosis not present

## 2021-09-06 LAB — NITRIC OXIDE: FeNO level (ppb): 32

## 2021-09-06 MED ORDER — ALBUTEROL SULFATE HFA 108 (90 BASE) MCG/ACT IN AERS: 2.0000 | INHALATION_SPRAY | Freq: Four times a day (QID) | RESPIRATORY_TRACT | 5 refills | Status: DC | PRN

## 2021-09-06 MED ORDER — FLUTICASONE PROPIONATE 50 MCG/ACT NA SUSP: 1.0000 | Freq: Every evening | NASAL | 2 refills | Status: DC

## 2021-09-06 MED ORDER — ARNUITY ELLIPTA 100 MCG/ACT IN AEPB
1.0000 | INHALATION_SPRAY | Freq: Every day | RESPIRATORY_TRACT | 5 refills | Status: DC
Start: 2021-09-06 — End: 2021-09-14

## 2021-09-06 MED ORDER — AZELASTINE HCL 0.15 % NA SOLN: 1.0000 | Freq: Every evening | NASAL | 2 refills | Status: DC

## 2021-09-06 NOTE — Progress Notes (Signed)
[facility]  Chief Complaint  Patient presents with   Follow-up    Follow-up: Sore from CPAP, ears clogged, chest pain, congested.    Constitutional:  BP 110/60 (BP Location: Left Arm)   Pulse 69   Ht '5\' 2"'$  (1.575 m)   Wt 208 lb (94.3 kg)   SpO2 96%   BMI 38.04 kg/m   Past Medical History:  HTN, HLD, GERD, Macular degeneration, Colon polyps, Osteopenia  Past Surgical History:  She  has a past surgical history that includes Cesarean section; Tubal ligation; Shoulder surgery (Left); Meniscus repair (Left, 09/13/2016); Foot neuroma surgery; Colonoscopy (10/29/2013); Foot surgery (Bilateral); and miniscus repair right  (Right, 2019).  Brief Summary:  Diana Hendrix is a 76 y.o. female with obstructive sleep apnea and upper airway cough.      Subjective:   She started feeling congested in her ears yesterday.  Has drainage down her throat and coughs intermittently.  Gets tight in her chest in the mornings especially.  She has tried nose sprays and antihistamine pills before, but these didn't help.  She thinks she pulled a back muscle from a coughing spell.  Her chest tightness is some better after her CPAP setting was decreased.  She tried sleeping without CPAP, but was very restless.  Physical Exam:   Appearance - well kempt   ENMT - no sinus tenderness, no oral exudate, no LAN, Mallampati 3 airway, no stridor, TM clear b/l  Respiratory - equal breath sounds bilaterally, no wheezing or rales  CV - s1s2 regular rate and rhythm, no murmurs  Ext - no clubbing, no edema  Skin - no rashes  Psych - normal mood and affect    Pulmonary testing:  PFT 08/07/19 >> FEV1 2.20 (108%), FEV1% 85, TLC 4.34 (89%), DLCO 88% FeNO 09/06/21 >> 32   Chest Imaging:    Sleep Tests:  PSG 09/17/12 >> RDI 13.7, SpO2 low 90%, PLMI 0, occasional PVC's and PAC's. Auto CPAP5/23/23 to 09/04/21 >> used on 89 of 90 nights with average 9 hrs 3 min.   Average AHI 0.9 with median CPAP 6 and 95 th percentile CPAP 8 cm H2O  Cardiac Tests:  Echo 04/30/19 >> EF 60 to 65%, grade 2 DD, mild AR  Social History:  She  reports that she has never smoked. She has never used smokeless tobacco. She reports that she does not drink alcohol and does not use drugs.  Family History:  Her family history includes Atrial fibrillation in her mother; CAD in her mother; CVA in her father; Congestive Heart Failure in her mother; Heart disease in her mother; Hyperlipidemia in her mother; Hypertension in her brother, mother, and son.     Assessment/Plan:   Obstructive sleep apnea. - she is compliant with therapy and reports benefit - she uses [employer] for her DME - current CPAP ordered May 2021 - she might still be having aerophagia causing chest discomfort; will change CPAP to 6 cm H2O  Upper airway cough syndrome. - from allergic rhinitis - will have her try flonase and astepro together - continue singulair  Allergic asthma. - continue singulair - add arnuity 100 one puff daily and prn ventolin  Chest tightness. - chest xray today - might need cardiology assessment if this persists   Obesity. - discussed importance of weight loss  Time Spent Involved in Patient Care on Day of Examination:  37 minutes  Follow up:   Patient Instructions  Arnuity 1  puff daily  Albuterol two puffs every 6 hours as needed for cough, wheeze, chest congestion or chest tightness  Will change your CPAP setting to 6 cm water pressure  Try using flonase and astepro nightly until next visit  Chest xray today  Follow up in 6 weeks  Medication List:   Allergies as of 09/06/2021       Reactions   Zyrtec [cetirizine]    Weird dreams.         Medication List        Accurate as of September 06, 2021 12:01 PM. If you have any questions, ask your nurse or doctor.          albuterol 108 (90 Base) MCG/ACT inhaler Commonly known as: Ventolin HFA Inhale 2  puffs into the lungs every 6 (six) hours as needed for wheezing or shortness of breath. Started by: Chesley Mires, MD   amitriptyline 25 MG tablet Commonly known as: ELAVIL TAKE 1 TABLET(25 MG) BY MOUTH AT BEDTIME   Arnuity Ellipta 100 MCG/ACT Aepb Generic drug: Fluticasone Furoate Inhale 1 puff into the lungs daily in the afternoon. Started by: Chesley Mires, MD   Azelastine HCl 0.15 % Soln Commonly known as: Astepro Place 1 spray into the nose at bedtime. Started by: Chesley Mires, MD   citalopram 10 MG tablet Commonly known as: CELEXA TAKE 1 TABLET(10 MG) BY MOUTH DAILY   fluticasone 50 MCG/ACT nasal spray Commonly known as: FLONASE Place 1 spray into both nostrils at bedtime. Started by: Chesley Mires, MD   furosemide 20 MG tablet Commonly known as: LASIX TAKE 1 TABLET(20 MG) BY MOUTH DAILY What changed: See the new instructions.   Linzess 290 MCG Caps capsule Generic drug: linaclotide TAKE 1 CAPSULE(290 MCG) BY MOUTH DAILY BEFORE AND BREAKFAST   metoprolol succinate 50 MG 24 hr tablet Commonly known as: TOPROL-XL Take 0.5 tablets (25 mg total) by mouth daily. Take with or immediately following a meal.   montelukast 10 MG tablet Commonly known as: SINGULAIR TAKE 1 TABLET(10 MG) BY MOUTH AT BEDTIME   NON FORMULARY CPAP.   olmesartan-hydrochlorothiazide 20-12.5 MG tablet Commonly known as: BENICAR HCT Take 1 tablet by mouth daily.   omeprazole 40 MG capsule Commonly known as: PRILOSEC Take 1 capsule (40 mg total) by mouth daily.   rosuvastatin 40 MG tablet Commonly known as: CRESTOR Take 1 tablet (40 mg total) by mouth daily.   SYSTANE OP Place 1 drop into both eyes 4 (four) times daily.   Vitamin D3 50 MCG (2000 UT) Tabs Take 1 tablet by mouth daily.        Signature:  Chesley Mires, MD [place] Pager - [phone] 09/06/2021, 12:01 PM

## 2021-09-06 NOTE — Patient Instructions (Addendum)
Arnuity 1 puff daily  Albuterol two puffs every 6 hours as needed for cough, wheeze, chest congestion or chest tightness  Will change your CPAP setting to 6 cm water pressure  Try using flonase and astepro nightly until next visit  Chest xray today  Follow up in 6 weeks

## 2021-09-08 ENCOUNTER — Encounter: Payer: Self-pay | Admitting: Pulmonary Disease

## 2021-09-08 ENCOUNTER — Other Ambulatory Visit (HOSPITAL_COMMUNITY): Payer: Self-pay

## 2021-09-08 NOTE — Telephone Encounter (Signed)
[facility] in [place] need prior approval from the insurance company before they can fill my prescription. Can you please take care of this as soon as possible. Thanks    PA team please advise, can you help with this Prior Auth? Thanks!

## 2021-09-10 ENCOUNTER — Other Ambulatory Visit: Payer: Self-pay | Admitting: Physician Assistant

## 2021-09-10 DIAGNOSIS — R0602 Shortness of breath: Secondary | ICD-10-CM

## 2021-09-10 DIAGNOSIS — R600 Localized edema: Secondary | ICD-10-CM

## 2021-09-12 ENCOUNTER — Encounter: Payer: Self-pay | Admitting: Pulmonary Disease

## 2021-09-12 NOTE — Telephone Encounter (Signed)
Pharmacy,  Please see pt's message regarding Arnuity. Thanks.

## 2021-09-13 ENCOUNTER — Other Ambulatory Visit (HOSPITAL_COMMUNITY): Payer: Self-pay

## 2021-09-13 NOTE — Telephone Encounter (Signed)
Okay to send pulmicort flex haler 180 mcg two puffs bid.

## 2021-09-13 NOTE — Telephone Encounter (Signed)
Dr. Halford Chessman, please advise if to do PA or change to Pulmicort flexhaler. Thank you!

## 2021-09-14 ENCOUNTER — Other Ambulatory Visit (HOSPITAL_COMMUNITY): Payer: Self-pay

## 2021-09-14 MED ORDER — PULMICORT FLEXHALER 180 MCG/ACT IN AEPB: 2.0000 | INHALATION_SPRAY | Freq: Two times a day (BID) | RESPIRATORY_TRACT | 2 refills | Status: DC

## 2021-10-01 ENCOUNTER — Other Ambulatory Visit: Payer: Self-pay | Admitting: Family Medicine

## 2021-10-11 DIAGNOSIS — H04123 Dry eye syndrome of bilateral lacrimal glands: Secondary | ICD-10-CM | POA: Diagnosis not present

## 2021-10-11 DIAGNOSIS — H16201 Unspecified keratoconjunctivitis, right eye: Secondary | ICD-10-CM | POA: Diagnosis not present

## 2021-10-13 ENCOUNTER — Encounter: Payer: Self-pay | Admitting: Pulmonary Disease

## 2021-10-14 NOTE — Telephone Encounter (Signed)
Dr. Sood, please see mychart message sent by pt and advise. °

## 2021-10-17 MED ORDER — FLUTICASONE-SALMETEROL 500-50 MCG/ACT IN AEPB
1.0000 | INHALATION_SPRAY | Freq: Two times a day (BID) | RESPIRATORY_TRACT | 5 refills | Status: DC
Start: 2021-10-17 — End: 2021-10-24

## 2021-10-17 NOTE — Telephone Encounter (Signed)
Please send script to change her from pulmicort to advair 500 one puff bid.

## 2021-10-24 ENCOUNTER — Encounter: Payer: Self-pay | Admitting: Family Medicine

## 2021-10-24 ENCOUNTER — Ambulatory Visit (INDEPENDENT_AMBULATORY_CARE_PROVIDER_SITE_OTHER): Payer: Medicare Other | Admitting: Family Medicine

## 2021-10-24 VITALS — BP 90/58 | HR 76 | Temp 97.4°F | Resp 14 | Ht 62.0 in | Wt 205.0 lb

## 2021-10-24 DIAGNOSIS — R052 Subacute cough: Secondary | ICD-10-CM

## 2021-10-24 DIAGNOSIS — K5904 Chronic idiopathic constipation: Secondary | ICD-10-CM

## 2021-10-24 DIAGNOSIS — F330 Major depressive disorder, recurrent, mild: Secondary | ICD-10-CM

## 2021-10-24 DIAGNOSIS — E782 Mixed hyperlipidemia: Secondary | ICD-10-CM | POA: Diagnosis not present

## 2021-10-24 DIAGNOSIS — I110 Hypertensive heart disease with heart failure: Secondary | ICD-10-CM | POA: Diagnosis not present

## 2021-10-24 DIAGNOSIS — J4540 Moderate persistent asthma, uncomplicated: Secondary | ICD-10-CM | POA: Diagnosis not present

## 2021-10-24 DIAGNOSIS — G4733 Obstructive sleep apnea (adult) (pediatric): Secondary | ICD-10-CM

## 2021-10-24 DIAGNOSIS — R7303 Prediabetes: Secondary | ICD-10-CM

## 2021-10-24 DIAGNOSIS — E559 Vitamin D deficiency, unspecified: Secondary | ICD-10-CM | POA: Diagnosis not present

## 2021-10-24 DIAGNOSIS — K219 Gastro-esophageal reflux disease without esophagitis: Secondary | ICD-10-CM | POA: Diagnosis not present

## 2021-10-24 DIAGNOSIS — M8580 Other specified disorders of bone density and structure, unspecified site: Secondary | ICD-10-CM

## 2021-10-24 DIAGNOSIS — Z23 Encounter for immunization: Secondary | ICD-10-CM | POA: Diagnosis not present

## 2021-10-24 DIAGNOSIS — I5032 Chronic diastolic (congestive) heart failure: Secondary | ICD-10-CM | POA: Diagnosis not present

## 2021-10-24 LAB — POC COVID19 BINAXNOW: SARS Coronavirus 2 Ag: NEGATIVE

## 2021-10-24 MED ORDER — LUBIPROSTONE 24 MCG PO CAPS: 24.0000 ug | ORAL_CAPSULE | Freq: Two times a day (BID) | ORAL | 2 refills | Status: DC

## 2021-10-24 MED ORDER — FLUTICASONE-SALMETEROL 500-50 MCG/ACT IN AEPB: 1.0000 | INHALATION_SPRAY | Freq: Two times a day (BID) | RESPIRATORY_TRACT | 5 refills | Status: DC

## 2021-10-24 NOTE — Assessment & Plan Note (Signed)
Changing trulance to amitiza 24 mcg one oral twice daily.

## 2021-10-24 NOTE — Assessment & Plan Note (Signed)
The current medical regimen is effective;  continue present plan and medications. Continue omeprazole 40 mg daily  

## 2021-10-24 NOTE — Assessment & Plan Note (Signed)
Recommend continue to work on eating healthy diet and exercise.  

## 2021-10-24 NOTE — Assessment & Plan Note (Signed)
Stop metoprolol. Continue olmesartan-hydrochlorothiazide 20/12.5 mg daily.

## 2021-10-24 NOTE — Assessment & Plan Note (Signed)
The current medical regimen is effective;  continue present plan and medications. Continue Amitriptyline 25 mg at bedtime, Celexa 10 mg daily.

## 2021-10-24 NOTE — Assessment & Plan Note (Addendum)
Well controlled.  No changes to medicines. Continue rosuvastatin 40 mg before bed.  Continue to work on eating a healthy diet and exercise.  Labs drawn today. ] 

## 2021-10-24 NOTE — Assessment & Plan Note (Signed)
Continue present plan with cpap. Compliant and benefits.

## 2021-10-24 NOTE — Patient Instructions (Addendum)
Hold metoprolol.  Continue olmesartan/hct  20/12.5. once daily.  Check bp daily.  Come back for nurse visit for bp check.   Use albuterol inhaler 2 puff four times a day as needed shortness of breath.   Amitiza 24 mcg one twice daily in place of trulance.

## 2021-10-24 NOTE — Progress Notes (Signed)
Subjective:  Patient ID: Diana Hendrix, female    DOB: 04/29/1945  Age: 76 y.o. MRN: [id number]  Chief Complaint  Patient presents with   Hypertension   Hyperlipidemia   HPI Hypertension: Taking Metoprolol 50 mg 1/2 tablet daily, Olmesartan-Hctz 20-12.5 mg 1 tablet daily. Usually 120/70s when she checks it. Today bp is 86/62. Denies diarrhea.   Hyperlipidemia: Takes Rosuvastatin 40 mg daily.  GERD: Currently taking Omeprazole 40 mg daily.  Asthma: Using monteluskast 10 mg 1 tablet at bedtime, Initially started onPulmicort 1 puff in the lungs twice a day but made her hoarse. One week ago changed to advair 500/50 mg one inhalation twice daily. Has not used albuterol inhaler 108 mcg 2 puffs into the lungs every 6 hours. Patient saw Dr. Halford Chessman and is using astepro and flonase.   OSA: Patient on CPAP. She is doing well. Patient feels rested. Complaint with nightly use.   Depression: Taking Amitriptyline 25 mg at bedtime, Celexa 10 mg daily.    10/24/2021    9:36 AM 06/28/2021    1:37 PM 04/12/2021    9:39 AM  PHQ9 SCORE ONLY  PHQ-9 Total Score 0 3 0    CIC: Ran out of linzess and started the trulance. Trulance worked great but paid $200 per month.  Takes miralax 1/2 dose daily. Patient reviewed her formulary amitiza appears to be the best price.   Current Outpatient Medications on File Prior to Visit  Medication Sig Dispense Refill   albuterol (VENTOLIN HFA) 108 (90 Base) MCG/ACT inhaler Inhale 2 puffs into the lungs every 6 (six) hours as needed for wheezing or shortness of breath. 1 each 5   amitriptyline (ELAVIL) 25 MG tablet TAKE 1 TABLET(25 MG) BY MOUTH AT BEDTIME 90 tablet 1   Azelastine HCl (ASTEPRO) 0.15 % SOLN Place 1 spray into the nose at bedtime. 30 mL 2   Cholecalciferol (VITAMIN D3) 50 MCG (2000 UT) TABS Take 1 tablet by mouth daily.     citalopram (CELEXA) 10 MG tablet TAKE 1 TABLET(10 MG) BY MOUTH DAILY 90 tablet 1   fluticasone (FLONASE) 50 MCG/ACT nasal spray  Place 1 spray into both nostrils at bedtime. 16 g 2   furosemide (LASIX) 20 MG tablet TAKE 1 TABLET(20 MG) BY MOUTH DAILY 90 tablet 1   metoprolol succinate (TOPROL-XL) 50 MG 24 hr tablet Take 0.5 tablets (25 mg total) by mouth daily. Take with or immediately following a meal. 1 tablet 0   montelukast (SINGULAIR) 10 MG tablet TAKE 1 TABLET(10 MG) BY MOUTH AT BEDTIME 90 tablet 3   NON FORMULARY CPAP.     olmesartan-hydrochlorothiazide (BENICAR HCT) 20-12.5 MG tablet Take 1 tablet by mouth daily. 90 tablet 1   omeprazole (PRILOSEC) 40 MG capsule Take 1 capsule (40 mg total) by mouth daily. 90 capsule 3   Polyethyl Glycol-Propyl Glycol (SYSTANE OP) Place 1 drop into both eyes 4 (four) times daily.     rosuvastatin (CRESTOR) 40 MG tablet Take 1 tablet (40 mg total) by mouth daily. 90 tablet 1   No current facility-administered medications on file prior to visit.   Past Medical History:  Diagnosis Date   Cancer ([place])    Diastolic heart failure (HCC)    Echo EF 60/65% Heart monitor was normal   GERD (gastroesophageal reflux disease)    History of colon polyps    Hyperlipidemia    Hypertension    Macular edema, cystoid    OSA (obstructive sleep apnea) 09/17/2012   Osteopenia  Plantar fasciitis 09/09/2019   Past Surgical History:  Procedure Laterality Date   CESAREAN SECTION     COLONOSCOPY  10/29/2013   Colonic polyp status post polypectomy. Mild sigmoid diverticulosis. Small internal hemorrhoids   FOOT NEUROMA SURGERY     s/p surgery in both feet   FOOT SURGERY Bilateral    Per patient   MENISCUS REPAIR Left 09/13/2016   miniscus repair right  Right 2019   SHOULDER SURGERY Left    arthroscopy.    TUBAL LIGATION      Family History  Problem Relation Age of Onset   Hyperlipidemia Mother    Heart disease Mother    Hypertension Mother    Atrial fibrillation Mother    CAD Mother    Congestive Heart Failure Mother    CVA Father    Hypertension Brother    Hypertension Son     Colon cancer Neg Hx    Esophageal cancer Neg Hx    Social History   Socioeconomic History   Marital status: Married    Spouse name: Transport planner   Number of children: 2   Years of education: Not on file   Highest education level: Not on file  Occupational History   Not on file  Tobacco Use   Smoking status: Never   Smokeless tobacco: Never  Vaping Use   Vaping Use: Never used  Substance and Sexual Activity   Alcohol use: No   Drug use: No   Sexual activity: Not Currently  Other Topics Concern   Not on file  Social History Narrative   Lives with husband   Right handed   Caffeine: 1 cup of tea and 1 pepsi in the AM   Social Determinants of Health   Financial Resource Strain: Low Risk  (05/14/2018)   Overall Financial Resource Strain (CARDIA)    Difficulty of Paying Living Expenses: Not hard at all  Food Insecurity: No Food Insecurity (01/20/2021)   Hunger Vital Sign    Worried About Running Out of Food in the Last Year: Never true    Ran Out of Food in the Last Year: Never true  Transportation Needs: No Transportation Needs (01/20/2021)   PRAPARE - Hydrologist (Medical): No    Lack of Transportation (Non-Medical): No  Physical Activity: Sufficiently Active (05/14/2018)   Exercise Vital Sign    Days of Exercise per Week: 5 days    Minutes of Exercise per Session: 40 min  Stress: Not on file  Social Connections: Socially Integrated (05/14/2018)   Social Connection and Isolation Panel [NHANES]    Frequency of Communication with Friends and Family: More than three times a week    Frequency of Social Gatherings with Friends and Family: More than three times a week    Attends Religious Services: 1 to 4 times per year    Active Member of [employer] or Organizations: Yes    Attends Archivist Meetings: 1 to 4 times per year    Marital Status: Married    Review of Systems  Constitutional:  Negative for chills, fatigue and fever.  HENT:   Positive for congestion. Negative for ear pain and sore throat.   Respiratory:  Positive for cough and shortness of breath.   Cardiovascular:  Negative for chest pain and palpitations.  Gastrointestinal:  Positive for constipation (controlled on trulance, but too expensive.). Negative for abdominal pain, diarrhea, nausea and vomiting.  Endocrine: Negative for polydipsia, polyphagia and polyuria.  Genitourinary:  Negative for difficulty urinating and dysuria.  Musculoskeletal:  Positive for back pain (left sided.). Negative for arthralgias and myalgias.  Skin:  Negative for rash.  Neurological:  Negative for dizziness and headaches.  Psychiatric/Behavioral:  Negative for dysphoric mood. The patient is not nervous/anxious.      Objective:  BP (!) 90/58   Pulse 76   Temp (!) 97.4 F (36.3 C)   Resp 14   Ht '5\' 2"'$  (1.575 m)   Wt 205 lb (93 kg)   SpO2 95%   BMI 37.49 kg/m      10/24/2021   10:16 AM 10/24/2021    9:33 AM 09/06/2021   11:13 AM  BP/Weight  Systolic BP 90 86 144  Diastolic BP 58 60 60  Wt. (Lbs)  205 208  BMI  37.49 kg/m2 38.04 kg/m2    Physical Exam Vitals reviewed.  Constitutional:      General: She is not in acute distress.    Appearance: Normal appearance. She is normal weight.  HENT:     Right Ear: Tympanic membrane and ear canal normal.     Left Ear: Tympanic membrane and ear canal normal.     Nose: Nose normal. No congestion or rhinorrhea.  Eyes:     Conjunctiva/sclera: Conjunctivae normal.  Neck:     Thyroid: No thyroid mass.     Vascular: No carotid bruit.  Cardiovascular:     Rate and Rhythm: Normal rate and regular rhythm.     Pulses: Normal pulses.     Heart sounds: Normal heart sounds. No murmur heard. Pulmonary:     Effort: Pulmonary effort is normal.     Breath sounds: Normal breath sounds.  Abdominal:     General: Bowel sounds are normal.     Palpations: Abdomen is soft. There is no mass.     Tenderness: There is no abdominal  tenderness.  Skin:    General: Skin is warm and dry.  Neurological:     Mental Status: She is alert and oriented to person, place, and time.     Cranial Nerves: No cranial nerve deficit.  Psychiatric:        Mood and Affect: Mood normal.        Behavior: Behavior normal.     Diabetic Foot Exam - Simple   No data filed      Lab Results  Component Value Date   WBC 6.3 07/18/2021   HGB 12.8 07/18/2021   HCT 38.3 07/18/2021   PLT 261 07/18/2021   GLUCOSE 98 07/18/2021   CHOL 146 07/18/2021   TRIG 178 (H) 07/18/2021   HDL 43 07/18/2021   LDLCALC 73 07/18/2021   ALT 23 07/18/2021   AST 23 07/18/2021   NA 135 07/18/2021   K 4.3 07/18/2021   CL 97 07/18/2021   CREATININE 1.16 (H) 07/18/2021   BUN 16 07/18/2021   CO2 23 07/18/2021   TSH 1.810 02/09/2021   HGBA1C 6.3 (H) 07/18/2021      Assessment & Plan:   Problem List Items Addressed This Visit       Cardiovascular and Mediastinum   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 Continue olmesartan-hydrochlorothiazide 20/12.5 mg daily.       Relevant Orders   Comprehensive metabolic panel   Lipid panel     Respiratory   OSA (obstructive sleep apnea)    Continue present plan with cpap. Compliant and benefits.  Digestive   GERD (gastroesophageal reflux disease)    The current medical regimen is effective;  continue present plan and medications.Continue omeprazole 40 mg daily.       Relevant Medications   lubiprostone (AMITIZA) 24 MCG capsule   Chronic idiopathic constipation    Changing trulance to amitiza 24 mcg one oral twice daily.       Relevant Medications   lubiprostone (AMITIZA) 24 MCG capsule     Musculoskeletal and Integument   Osteopenia   Relevant Orders   VITAMIN D 25 Hydroxy (Vit-D Deficiency, Fractures)     Other   Mixed hyperlipidemia    Well controlled.  No changes to medicines. Continue rosuvastatin 40 mg  before bed.  Continue to work on eating a healthy diet and exercise.  Labs drawn today.         Mild recurrent major depression (HCC)    The current medical regimen is effective;  continue present plan and medications. Continue Amitriptyline 25 mg at bedtime, Celexa 10 mg daily.       Prediabetes    Recommend continue to work on eating healthy diet and exercise.       Relevant Orders   Hemoglobin A1c   Other Visit Diagnoses     Subacute cough       Relevant Orders   POC COVID-19   Moderate persistent asthma without complication       Relevant Medications   fluticasone-salmeterol (ADVAIR DISKUS) 500-50 MCG/ACT AEPB   Mild vitamin D deficiency       Relevant Orders   VITAMIN D 25 Hydroxy (Vit-D Deficiency, Fractures)   Need for immunization against influenza       Relevant Orders   Flu Vaccine QUAD High Dose(Fluad) (Completed)     .  Meds ordered this encounter  Medications   fluticasone-salmeterol (ADVAIR DISKUS) 500-50 MCG/ACT AEPB    Sig: Inhale 1 puff into the lungs in the morning and at bedtime.    Dispense:  60 each    Refill:  5   lubiprostone (AMITIZA) 24 MCG capsule    Sig: Take 1 capsule (24 mcg total) by mouth 2 (two) times daily with a meal.    Dispense:  60 capsule    Refill:  2    Orders Placed This Encounter  Procedures   Flu Vaccine QUAD High Dose(Fluad)   Comprehensive metabolic panel   Hemoglobin A1c   Lipid panel   VITAMIN D 25 Hydroxy (Vit-D Deficiency, Fractures)   POC COVID-19    Total time spent on today's visit was greater than 30 minutes, including both face-to-face time and nonface-to-face time personally spent on review of chart (labs and imaging), discussing labs and goals, discussing further work-up, treatment options, referrals to specialist if needed, reviewing outside records of pertinent, answering patient's questions, and coordinating care.   Follow-up: Return in about 3 months (around 01/24/2022) for chronic fasting, Nurse  visit in 1-2 weeks for bp check. .  An After Visit Summary was printed and given to the patient.  Rochel Brome, MD [facility] [phone]

## 2021-10-25 ENCOUNTER — Encounter: Payer: Self-pay | Admitting: Family Medicine

## 2021-10-25 LAB — COMPREHENSIVE METABOLIC PANEL
ALT: 20 IU/L (ref 0–32)
AST: 25 IU/L (ref 0–40)
Albumin/Globulin Ratio: 1.6 (ref 1.2–2.2)
Albumin: 4.8 g/dL (ref 3.8–4.8)
Alkaline Phosphatase: 97 IU/L (ref 44–121)
BUN/Creatinine Ratio: 12 (ref 12–28)
BUN: 18 mg/dL (ref 8–27)
Bilirubin Total: 0.4 mg/dL (ref 0.0–1.2)
CO2: 25 mmol/L (ref 20–29)
Calcium: 10 mg/dL (ref 8.7–10.3)
Chloride: 93 mmol/L — ABNORMAL LOW (ref 96–106)
Creatinine, Ser: 1.52 mg/dL — ABNORMAL HIGH (ref 0.57–1.00)
Globulin, Total: 3 g/dL (ref 1.5–4.5)
Glucose: 105 mg/dL — ABNORMAL HIGH (ref 70–99)
Potassium: 4.1 mmol/L (ref 3.5–5.2)
Sodium: 135 mmol/L (ref 134–144)
Total Protein: 7.8 g/dL (ref 6.0–8.5)
eGFR: 36 mL/min/{1.73_m2} — ABNORMAL LOW (ref >59)

## 2021-10-25 LAB — CARDIOVASCULAR RISK ASSESSMENT

## 2021-10-25 LAB — LIPID PANEL
Chol/HDL Ratio: 2.9 ratio (ref 0.0–4.4)
Cholesterol, Total: 153 mg/dL (ref 100–199)
HDL: 52 mg/dL (ref >39)
LDL Chol Calc (NIH): 74 mg/dL (ref 0–99)
Triglycerides: 157 mg/dL — ABNORMAL HIGH (ref 0–149)
VLDL Cholesterol Cal: 27 mg/dL (ref 5–40)

## 2021-10-25 LAB — VITAMIN D 25 HYDROXY (VIT D DEFICIENCY, FRACTURES): Vit D, 25-Hydroxy: 51.1 ng/mL (ref 30.0–100.0)

## 2021-10-25 LAB — HEMOGLOBIN A1C
Est. average glucose Bld gHb Est-mCnc: 140 mg/dL
Hgb A1c MFr Bld: 6.5 % — ABNORMAL HIGH (ref 4.8–5.6)

## 2021-10-25 NOTE — Progress Notes (Signed)
Blood count normal.  Liver function normal.  Kidney function abnormal.  Worsened.  Avoid all NSAIDs.  Stop olmesartan/HCT.  I discontinued metoprolol at her visit.  I would remain off of this.  Recommend patient return for provider visit in 2 to 3 weeks for recheck of chemistry panel and blood pressure  cholesterol: Triglycerides still slightly up but improved.  Continue current management. Vitamin D normal HBA1C: 6.5.  Patient is now diabetic.  Consider addition of a medicine in the near future.  Recommend education given

## 2021-10-27 ENCOUNTER — Other Ambulatory Visit: Payer: Self-pay

## 2021-10-27 DIAGNOSIS — E118 Type 2 diabetes mellitus with unspecified complications: Secondary | ICD-10-CM

## 2021-10-29 ENCOUNTER — Other Ambulatory Visit: Payer: Self-pay | Admitting: Family Medicine

## 2021-11-04 ENCOUNTER — Ambulatory Visit: Payer: Medicare Other

## 2021-11-07 ENCOUNTER — Ambulatory Visit (INDEPENDENT_AMBULATORY_CARE_PROVIDER_SITE_OTHER): Payer: Medicare Other | Admitting: Pulmonary Disease

## 2021-11-07 ENCOUNTER — Encounter: Payer: Self-pay | Admitting: Pulmonary Disease

## 2021-11-07 VITALS — BP 118/70 | HR 71 | Temp 97.8°F | Ht 62.0 in | Wt 206.2 lb

## 2021-11-07 DIAGNOSIS — J4550 Severe persistent asthma, uncomplicated: Secondary | ICD-10-CM | POA: Diagnosis not present

## 2021-11-07 MED ORDER — TRELEGY ELLIPTA 100-62.5-25 MCG/ACT IN AEPB: 1.0000 | INHALATION_SPRAY | Freq: Every day | RESPIRATORY_TRACT | 5 refills | Status: DC

## 2021-11-07 MED ORDER — TRELEGY ELLIPTA 100-62.5-25 MCG/ACT IN AEPB: 1.0000 | INHALATION_SPRAY | Freq: Every day | RESPIRATORY_TRACT | 0 refills | Status: DC

## 2021-11-07 MED ORDER — ALBUTEROL SULFATE (2.5 MG/3ML) 0.083% IN NEBU: 2.5000 mg | INHALATION_SOLUTION | Freq: Four times a day (QID) | RESPIRATORY_TRACT | 5 refills | Status: DC | PRN

## 2021-11-07 NOTE — Patient Instructions (Signed)
Trelegy 1 puff in the evening, and rinse your mouth after each use.  Stop advair once you start using trelegy.  Will arrange for a spacer device to use with your albuterol inhaler.  Will arrange for home nebulizer machine and albuterol to use in your nebulizer machine as needed.  Follow up in 4 months.

## 2021-11-07 NOTE — Progress Notes (Signed)
[facility]  Chief Complaint  Patient presents with   Follow-up    OSA    Constitutional:  BP 118/70 (BP Location: Left Arm, Patient Position: Sitting, Cuff Size: Normal)   Pulse 71   Temp 97.8 F (36.6 C) (Oral)   Ht '5\' 2"'$  (1.575 m)   Wt 206 lb 3.2 oz (93.5 kg)   SpO2 97%   BMI 37.71 kg/m   Past Medical History:  HTN, HLD, GERD, Macular degeneration, Colon polyps, Osteopenia  Past Surgical History:  She  has a past surgical history that includes Cesarean section; Tubal ligation; Shoulder surgery (Left); Meniscus repair (Left, 09/13/2016); Foot neuroma surgery; Colonoscopy (10/29/2013); Foot surgery (Bilateral); and miniscus repair right  (Right, 2019).  Brief Summary:  Diana Hendrix is a 76 y.o. female with obstructive sleep apnea and upper airway cough.      Subjective:   She feels that advair has helped, but she is still having respiratory symptoms. She gets hoarseness and raspy voice after using albuterol.  Gets chest congestion and has to cough up phlegm.  This last for about 30 minutes to an hour after using albuterol.  Uses CPAP nightly.  This is working well.  Physical Exam:   Appearance - well kempt   ENMT - no sinus tenderness, no oral exudate, no LAN, Mallampati 3 airway, no stridor  Respiratory - equal breath sounds bilaterally, no wheezing or rales  CV - s1s2 regular rate and rhythm, no murmurs  Ext - no clubbing, no edema  Skin - no rashes  Psych - normal mood and affect    Pulmonary testing:  PFT 08/07/19 >> FEV1 2.20 (108%), FEV1% 85, TLC 4.34 (89%), DLCO 88% FeNO 09/06/21 >> 32   Chest Imaging:    Sleep Tests:  PSG 09/17/12 >> RDI 13.7, SpO2 low 90%, PLMI 0, occasional PVC's and PAC's. Auto CPAP 10/05/21 to 11/03/21 >> used on 30 of 30 nights with average 9 hrs 25 min.  Average AHI 1.3 with CPAP 6 cm H2O  Cardiac Tests:  Echo 04/30/19 >> EF 60 to 65%, grade 2 DD, mild AR  Social History:   She  reports that she has never smoked. She has never used smokeless tobacco. She reports that she does not drink alcohol and does not use drugs.  Family History:  Her family history includes Atrial fibrillation in her mother; CAD in her mother; CVA in her father; Congestive Heart Failure in her mother; Heart disease in her mother; Hyperlipidemia in her mother; Hypertension in her brother, mother, and son.     Assessment/Plan:   Allergic asthma. - will try switching from advair to trelegy 100 one puff daily; sample provided - continue singulair at night - will arrange for spacer device to use with her albuterol inhaler - will arrange for a home nebulizer also  Obstructive sleep apnea. - she is compliant with therapy and reports benefit - she uses [employer] for her DME - current CPAP ordered May 2021 - continue CPAP 6 cm H2O  Upper airway cough syndrome. - from allergic rhinitis - astepro caused bad taste >> will stop this - continue singulair - prn flonase  Time Spent Involved in Patient Care on Day of Examination:  37 minutes  Follow up:   Patient Instructions  Trelegy 1 puff in the evening, and rinse your mouth after each use.  Stop advair once you start using trelegy.  Will arrange for a spacer device to use with  your albuterol inhaler.  Will arrange for home nebulizer machine and albuterol to use in your nebulizer machine as needed.  Follow up in 4 months.  Medication List:   Allergies as of 11/07/2021       Reactions   Zyrtec [cetirizine]    Weird dreams.         Medication List        Accurate as of November 07, 2021  1:18 PM. If you have any questions, ask your nurse or doctor.          STOP taking these medications    fluticasone-salmeterol 500-50 MCG/ACT Aepb Commonly known as: Advair Diskus Stopped by: Chesley Mires, MD       TAKE these medications    albuterol 108 (90 Base) MCG/ACT inhaler Commonly known as: Ventolin HFA Inhale 2  puffs into the lungs every 6 (six) hours as needed for wheezing or shortness of breath. What changed: Another medication with the same name was added. Make sure you understand how and when to take each. Changed by: Chesley Mires, MD   albuterol (2.5 MG/3ML) 0.083% nebulizer solution Commonly known as: PROVENTIL Take 3 mLs (2.5 mg total) by nebulization every 6 (six) hours as needed for wheezing or shortness of breath. What changed: You were already taking a medication with the same name, and this prescription was added. Make sure you understand how and when to take each. Changed by: Chesley Mires, MD   amitriptyline 25 MG tablet Commonly known as: ELAVIL TAKE 1 TABLET(25 MG) BY MOUTH AT BEDTIME   Azelastine HCl 0.15 % Soln Commonly known as: Astepro Place 1 spray into the nose at bedtime.   citalopram 10 MG tablet Commonly known as: CELEXA TAKE 1 TABLET(10 MG) BY MOUTH DAILY   fluticasone 50 MCG/ACT nasal spray Commonly known as: FLONASE Place 1 spray into both nostrils at bedtime.   furosemide 20 MG tablet Commonly known as: LASIX TAKE 1 TABLET(20 MG) BY MOUTH DAILY   lubiprostone 24 MCG capsule Commonly known as: AMITIZA Take 1 capsule (24 mcg total) by mouth 2 (two) times daily with a meal.   metoprolol succinate 50 MG 24 hr tablet Commonly known as: TOPROL-XL Take 0.5 tablets (25 mg total) by mouth daily. Take with or immediately following a meal.   montelukast 10 MG tablet Commonly known as: SINGULAIR TAKE 1 TABLET(10 MG) BY MOUTH AT BEDTIME   NON FORMULARY CPAP.   olmesartan-hydrochlorothiazide 20-12.5 MG tablet Commonly known as: BENICAR HCT Take 1 tablet by mouth daily.   omeprazole 40 MG capsule Commonly known as: PRILOSEC Take 1 capsule (40 mg total) by mouth daily.   rosuvastatin 40 MG tablet Commonly known as: CRESTOR TAKE 1 TABLET(40 MG) BY MOUTH DAILY   SYSTANE OP Place 1 drop into both eyes 4 (four) times daily.   Trelegy Ellipta 100-62.5-25  MCG/ACT Aepb Generic drug: Fluticasone-Umeclidin-Vilant Inhale 1 puff into the lungs daily in the afternoon. Started by: Chesley Mires, MD   Trelegy Ellipta 100-62.5-25 MCG/ACT Aepb Generic drug: Fluticasone-Umeclidin-Vilant Inhale 1 puff into the lungs daily. Started by: Chesley Mires, MD   Vitamin D3 50 MCG (2000 UT) Tabs Take 1 tablet by mouth daily.        Signature:  Chesley Mires, MD [place] Pager - [phone] 11/07/2021, 1:18 PM

## 2021-11-08 ENCOUNTER — Ambulatory Visit (INDEPENDENT_AMBULATORY_CARE_PROVIDER_SITE_OTHER): Payer: Medicare Other | Admitting: Family Medicine

## 2021-11-08 VITALS — BP 106/68 | HR 74 | Temp 97.1°F | Resp 18 | Ht 62.0 in | Wt 204.6 lb

## 2021-11-08 DIAGNOSIS — Z23 Encounter for immunization: Secondary | ICD-10-CM

## 2021-11-08 DIAGNOSIS — I110 Hypertensive heart disease with heart failure: Secondary | ICD-10-CM

## 2021-11-08 DIAGNOSIS — I5032 Chronic diastolic (congestive) heart failure: Secondary | ICD-10-CM

## 2021-11-08 DIAGNOSIS — E118 Type 2 diabetes mellitus with unspecified complications: Secondary | ICD-10-CM

## 2021-11-08 NOTE — Progress Notes (Signed)
Subjective:  Patient ID: Diana Hendrix, female    DOB: 1946/01/14  Age: 76 y.o. MRN: [id number]  Chief Complaint  Patient presents with   Hypertension    HPI Hypertension  Patient is taking Metoprolol Succinate '25mg'$  daily and Furosemide 20 mg daily. I held benicar/hct for a bp of 86/62 at her last appt 3 weeks ago.  BP increased and swelling/her weight increased by 6 lbs. Patient had dyspnea. BP gradually increased to 150/77.  The pt restarted the benicar/hct 20/12.5 mg and it improved to 131/70, Pulse 60-80s.   HBA1C: 6.5.  Patient is now diabetic.  Consider addition of a medicine in the near future.  Patient is set up to go to diabetic education. Will give handouts today.   Current Outpatient Medications on File Prior to Visit  Medication Sig Dispense Refill   albuterol (PROVENTIL) (2.5 MG/3ML) 0.083% nebulizer solution Take 3 mLs (2.5 mg total) by nebulization every 6 (six) hours as needed for wheezing or shortness of breath. 360 mL 5   amitriptyline (ELAVIL) 25 MG tablet TAKE 1 TABLET(25 MG) BY MOUTH AT BEDTIME (Patient taking differently: Take 25 mg by mouth at bedtime.) 90 tablet 1   Cholecalciferol (VITAMIN D3) 50 MCG (2000 UT) TABS Take 1 tablet by mouth daily.     citalopram (CELEXA) 10 MG tablet TAKE 1 TABLET(10 MG) BY MOUTH DAILY (Patient taking differently: Take 10 mg by mouth daily.) 90 tablet 1   fluticasone (FLONASE) 50 MCG/ACT nasal spray Place 1 spray into both nostrils at bedtime. (Patient not taking: Reported on 11/09/2021) 16 g 2   Fluticasone-Umeclidin-Vilant (TRELEGY ELLIPTA) 100-62.5-25 MCG/ACT AEPB Inhale 1 puff into the lungs daily in the afternoon. 60 each 5   furosemide (LASIX) 20 MG tablet TAKE 1 TABLET(20 MG) BY MOUTH DAILY (Patient taking differently: Take 20 mg by mouth daily.) 90 tablet 1   metoprolol succinate (TOPROL-XL) 50 MG 24 hr tablet Take 0.5 tablets (25 mg total) by mouth daily. Take with or immediately following a meal. 1 tablet 0   montelukast  (SINGULAIR) 10 MG tablet TAKE 1 TABLET(10 MG) BY MOUTH AT BEDTIME (Patient taking differently: Take 10 mg by mouth at bedtime.) 90 tablet 3   NON FORMULARY CPAP at bedtime     omeprazole (PRILOSEC) 40 MG capsule Take 1 capsule (40 mg total) by mouth daily. 90 capsule 3   Polyethyl Glycol-Propyl Glycol (SYSTANE OP) Place 1 drop into both eyes 4 (four) times daily.     rosuvastatin (CRESTOR) 40 MG tablet TAKE 1 TABLET(40 MG) BY MOUTH DAILY (Patient taking differently: Take 40 mg by mouth daily.) 90 tablet 1   No current facility-administered medications on file prior to visit.   Past Medical History:  Diagnosis Date   Cancer ([facility])    Diastolic heart failure (H[facility])    Echo EF 60/65% Heart monitor was normal   GERD (gastroesophageal reflux disease)    History of colon polyps    Hyperlipidemia    Hypertension    Macular edema, cystoid    OSA (obstructive sleep apnea) 09/17/2012   Osteopenia    Plantar fasciitis 09/09/2019   Past Surgical History:  Procedure Laterality Date   CESAREAN SECTION     COLONOSCOPY  10/29/2013   Colonic polyp status post polypectomy. Mild sigmoid diverticulosis. Small internal hemorrhoids   FOOT NEUROMA SURGERY     s/p surgery in both feet   FOOT SURGERY Bilateral    Per patient   MENISCUS REPAIR Left 09/13/2016  miniscus repair right  Right 2019   SHOULDER SURGERY Left    arthroscopy.    TUBAL LIGATION      Family History  Problem Relation Age of Onset   Hyperlipidemia Mother    Heart disease Mother    Hypertension Mother    Atrial fibrillation Mother    CAD Mother    Congestive Heart Failure Mother    CVA Father    Hypertension Brother    Hypertension Son    Colon cancer Neg Hx    Esophageal cancer Neg Hx    Social History   Socioeconomic History   Marital status: Married    Spouse name: Transport planner   Number of children: 2   Years of education: Not on file   Highest education level: Not on file  Occupational History   Not on file   Tobacco Use   Smoking status: Never   Smokeless tobacco: Never  Vaping Use   Vaping Use: Never used  Substance and Sexual Activity   Alcohol use: No   Drug use: No   Sexual activity: Not Currently  Other Topics Concern   Not on file  Social History Narrative   Lives with husband   Right handed   Caffeine: 1 cup of tea and 1 pepsi in the AM   Social Determinants of Health   Financial Resource Strain: Low Risk  (05/14/2018)   Overall Financial Resource Strain (CARDIA)    Difficulty of Paying Living Expenses: Not hard at all  Food Insecurity: No Food Insecurity (11/10/2021)   Hunger Vital Sign    Worried About Running Out of Food in the Last Year: Never true    Ran Out of Food in the Last Year: Never true  Transportation Needs: No Transportation Needs (11/10/2021)   PRAPARE - Hydrologist (Medical): No    Lack of Transportation (Non-Medical): No  Physical Activity: Sufficiently Active (05/14/2018)   Exercise Vital Sign    Days of Exercise per Week: 5 days    Minutes of Exercise per Session: 40 min  Stress: Not on file  Social Connections: Socially Integrated (05/14/2018)   Social Connection and Isolation Panel [NHANES]    Frequency of Communication with Friends and Family: More than three times a week    Frequency of Social Gatherings with Friends and Family: More than three times a week    Attends Religious Services: 1 to 4 times per year    Active Member of [employer] or Organizations: Yes    Attends Archivist Meetings: 1 to 4 times per year    Marital Status: Married    Review of Systems  Constitutional:  Negative for chills and fatigue.  HENT:  Negative for congestion, ear pain, nosebleeds, sinus pain and sore throat.   Respiratory:  Positive for cough and shortness of breath.   Cardiovascular:  Negative for chest pain and leg swelling.  Gastrointestinal:  Negative for abdominal pain, constipation, diarrhea, nausea and vomiting.   Genitourinary:  Negative for dysuria and frequency.  Musculoskeletal:  Negative for arthralgias, back pain and myalgias.  Neurological:  Negative for dizziness and headaches.     Objective:  BP 106/68   Pulse 74   Temp (!) 97.1 F (36.2 C)   Resp 18   Ht '5\' 2"'$  (1.575 m)   Wt 204 lb 9.6 oz (92.8 kg)   SpO2 97%   BMI 37.42 kg/m      11/14/2021   12:22 PM  11/14/2021   12:21 PM 11/14/2021    8:16 AM  BP/Weight  Systolic BP 161 096 045  Diastolic BP 85 85 80    Physical Exam Vitals reviewed.  Constitutional:      Appearance: Normal appearance.  Neck:     Vascular: No carotid bruit.  Cardiovascular:     Rate and Rhythm: Normal rate and regular rhythm.     Heart sounds: Normal heart sounds.  Pulmonary:     Effort: Pulmonary effort is normal. No respiratory distress.     Breath sounds: Normal breath sounds.  Abdominal:     General: Abdomen is flat. Bowel sounds are normal.     Palpations: Abdomen is soft.     Tenderness: There is no abdominal tenderness.  Neurological:     Mental Status: She is alert and oriented to person, place, and time.  Psychiatric:        Mood and Affect: Mood normal.        Behavior: Behavior normal.     Diabetic Foot Exam - Simple   Simple Foot Form  11/08/2021  1:00 AM  Visual Inspection No deformities, no ulcerations, no other skin breakdown bilaterally: Yes Sensation Testing Intact to touch and monofilament testing bilaterally: Yes Pulse Check Posterior Tibialis and Dorsalis pulse intact bilaterally: Yes Comments      Lab Results  Component Value Date   WBC 11.0 (H) 11/11/2021   HGB 11.8 (L) 11/11/2021   HCT 34.6 (L) 11/11/2021   PLT 222 11/11/2021   GLUCOSE 121 (H) 11/13/2021   CHOL 153 10/24/2021   TRIG 157 (H) 10/24/2021   HDL 52 10/24/2021   LDLCALC 74 10/24/2021   ALT 20 11/10/2021   AST 28 11/10/2021   NA 132 (L) 11/13/2021   K 4.3 11/13/2021   CL 95 (L) 11/13/2021   CREATININE 1.22 (H) 11/13/2021   BUN 14  11/13/2021   CO2 29 11/13/2021   TSH 1.810 02/09/2021   INR 1.1 11/09/2021   HGBA1C 6.5 (H) 10/24/2021      Assessment & Plan:   Problem List Items Addressed This Visit       Cardiovascular and Mediastinum   Hypertensive heart disease with chronic diastolic congestive heart failure ([place]) - Primary    The current medical regimen is effective;  continue present plan and medications. Continue Metoprolol Succinate '25mg'$  daily and Furosemide 20 mg daily, and benicar/hct 20/12.5 mg daily. Continue to monitor bp and pulse. Let us know how they are doing particularly if low and becomes dizzy.       Relevant Orders   Comprehensive metabolic panel (Completed)     Endocrine   Diabetes mellitus type 2 with complications ([place])    New dx. Education given . Check microalb/creat ratio.      Relevant Orders   Microalbumin/Creatinine Ratio, Urine (Completed)     Other   Encounter for immunization   Relevant Orders   [employer] Fall 2023 Covid-19 Vaccine 84yr and older (Completed)  .  No orders of the defined types were placed in this encounter.   Orders Placed This Encounter  Procedures   [place] 2023 Covid-19 Vaccine 151yrand older   Comprehensive metabolic panel   Microalbumin/Creatinine Ratio, Urine     Follow-up: Return in about 3 months (around 02/08/2022) for chronic fasting.  An After Visit Summary was printed and given to the patient.  KiRochel BromeMD [facility] [phone]

## 2021-11-09 ENCOUNTER — Encounter (HOSPITAL_COMMUNITY): Payer: Self-pay | Admitting: Family Medicine

## 2021-11-09 ENCOUNTER — Emergency Department (HOSPITAL_COMMUNITY): Payer: Medicare Other

## 2021-11-09 ENCOUNTER — Inpatient Hospital Stay (HOSPITAL_COMMUNITY)
Admission: EM | Admit: 2021-11-09 | Discharge: 2021-11-14 | DRG: 189 | Disposition: A | Discharge status: Skilled Nursing Facility | Payer: Medicare Other | Attending: Family Medicine | Admitting: Family Medicine

## 2021-11-09 ENCOUNTER — Other Ambulatory Visit: Payer: Self-pay

## 2021-11-09 DIAGNOSIS — M47812 Spondylosis without myelopathy or radiculopathy, cervical region: Secondary | ICD-10-CM | POA: Diagnosis not present

## 2021-11-09 DIAGNOSIS — S0240CA Maxillary fracture, right side, initial encounter for closed fracture: Secondary | ICD-10-CM | POA: Diagnosis present

## 2021-11-09 DIAGNOSIS — S299XXA Unspecified injury of thorax, initial encounter: Secondary | ICD-10-CM | POA: Diagnosis not present

## 2021-11-09 DIAGNOSIS — J45909 Unspecified asthma, uncomplicated: Secondary | ICD-10-CM | POA: Diagnosis present

## 2021-11-09 DIAGNOSIS — S022XXA Fracture of nasal bones, initial encounter for closed fracture: Secondary | ICD-10-CM

## 2021-11-09 DIAGNOSIS — I959 Hypotension, unspecified: Secondary | ICD-10-CM | POA: Diagnosis not present

## 2021-11-09 DIAGNOSIS — M4854XA Collapsed vertebra, not elsewhere classified, thoracic region, initial encounter for fracture: Secondary | ICD-10-CM | POA: Diagnosis present

## 2021-11-09 DIAGNOSIS — K5904 Chronic idiopathic constipation: Secondary | ICD-10-CM | POA: Diagnosis not present

## 2021-11-09 DIAGNOSIS — Z043 Encounter for examination and observation following other accident: Secondary | ICD-10-CM | POA: Diagnosis not present

## 2021-11-09 DIAGNOSIS — K219 Gastro-esophageal reflux disease without esophagitis: Secondary | ICD-10-CM | POA: Diagnosis present

## 2021-11-09 DIAGNOSIS — M8580 Other specified disorders of bone density and structure, unspecified site: Secondary | ICD-10-CM | POA: Diagnosis present

## 2021-11-09 DIAGNOSIS — I5032 Chronic diastolic (congestive) heart failure: Secondary | ICD-10-CM | POA: Diagnosis not present

## 2021-11-09 DIAGNOSIS — R55 Syncope and collapse: Secondary | ICD-10-CM

## 2021-11-09 DIAGNOSIS — S0031XA Abrasion of nose, initial encounter: Secondary | ICD-10-CM | POA: Diagnosis not present

## 2021-11-09 DIAGNOSIS — E118 Type 2 diabetes mellitus with unspecified complications: Secondary | ICD-10-CM

## 2021-11-09 DIAGNOSIS — I130 Hypertensive heart and chronic kidney disease with heart failure and stage 1 through stage 4 chronic kidney disease, or unspecified chronic kidney disease: Secondary | ICD-10-CM | POA: Diagnosis not present

## 2021-11-09 DIAGNOSIS — J9601 Acute respiratory failure with hypoxia: Principal | ICD-10-CM | POA: Diagnosis present

## 2021-11-09 DIAGNOSIS — Z9181 History of falling: Secondary | ICD-10-CM | POA: Diagnosis not present

## 2021-11-09 DIAGNOSIS — K5900 Constipation, unspecified: Secondary | ICD-10-CM | POA: Diagnosis present

## 2021-11-09 DIAGNOSIS — E1159 Type 2 diabetes mellitus with other circulatory complications: Secondary | ICD-10-CM

## 2021-11-09 DIAGNOSIS — I5033 Acute on chronic diastolic (congestive) heart failure: Secondary | ICD-10-CM | POA: Diagnosis not present

## 2021-11-09 DIAGNOSIS — E876 Hypokalemia: Secondary | ICD-10-CM | POA: Diagnosis present

## 2021-11-09 DIAGNOSIS — G4733 Obstructive sleep apnea (adult) (pediatric): Secondary | ICD-10-CM | POA: Diagnosis present

## 2021-11-09 DIAGNOSIS — I110 Hypertensive heart disease with heart failure: Secondary | ICD-10-CM | POA: Diagnosis not present

## 2021-11-09 DIAGNOSIS — E11311 Type 2 diabetes mellitus with unspecified diabetic retinopathy with macular edema: Secondary | ICD-10-CM | POA: Diagnosis present

## 2021-11-09 DIAGNOSIS — Z823 Family history of stroke: Secondary | ICD-10-CM

## 2021-11-09 DIAGNOSIS — D72829 Elevated white blood cell count, unspecified: Secondary | ICD-10-CM

## 2021-11-09 DIAGNOSIS — E119 Type 2 diabetes mellitus without complications: Secondary | ICD-10-CM | POA: Diagnosis not present

## 2021-11-09 DIAGNOSIS — S022XXD Fracture of nasal bones, subsequent encounter for fracture with routine healing: Secondary | ICD-10-CM | POA: Diagnosis not present

## 2021-11-09 DIAGNOSIS — R279 Unspecified lack of coordination: Secondary | ICD-10-CM | POA: Diagnosis not present

## 2021-11-09 DIAGNOSIS — M545 Low back pain, unspecified: Secondary | ICD-10-CM | POA: Diagnosis not present

## 2021-11-09 DIAGNOSIS — S0083XA Contusion of other part of head, initial encounter: Secondary | ICD-10-CM | POA: Diagnosis present

## 2021-11-09 DIAGNOSIS — I10 Essential (primary) hypertension: Secondary | ICD-10-CM

## 2021-11-09 DIAGNOSIS — M4807 Spinal stenosis, lumbosacral region: Secondary | ICD-10-CM | POA: Diagnosis not present

## 2021-11-09 DIAGNOSIS — Z83438 Family history of other disorder of lipoprotein metabolism and other lipidemia: Secondary | ICD-10-CM

## 2021-11-09 DIAGNOSIS — E1122 Type 2 diabetes mellitus with diabetic chronic kidney disease: Secondary | ICD-10-CM | POA: Diagnosis present

## 2021-11-09 DIAGNOSIS — E871 Hypo-osmolality and hyponatremia: Secondary | ICD-10-CM | POA: Diagnosis present

## 2021-11-09 DIAGNOSIS — Z79899 Other long term (current) drug therapy: Secondary | ICD-10-CM

## 2021-11-09 DIAGNOSIS — Y92002 Bathroom of unspecified non-institutional (private) residence single-family (private) house as the place of occurrence of the external cause: Secondary | ICD-10-CM | POA: Diagnosis not present

## 2021-11-09 DIAGNOSIS — R112 Nausea with vomiting, unspecified: Secondary | ICD-10-CM | POA: Diagnosis present

## 2021-11-09 DIAGNOSIS — Z8249 Family history of ischemic heart disease and other diseases of the circulatory system: Secondary | ICD-10-CM

## 2021-11-09 DIAGNOSIS — W1811XA Fall from or off toilet without subsequent striking against object, initial encounter: Secondary | ICD-10-CM | POA: Diagnosis present

## 2021-11-09 DIAGNOSIS — N183 Chronic kidney disease, stage 3 unspecified: Secondary | ICD-10-CM

## 2021-11-09 DIAGNOSIS — S22080D Wedge compression fracture of T11-T12 vertebra, subsequent encounter for fracture with routine healing: Secondary | ICD-10-CM | POA: Diagnosis not present

## 2021-11-09 DIAGNOSIS — M5030 Other cervical disc degeneration, unspecified cervical region: Secondary | ICD-10-CM | POA: Diagnosis not present

## 2021-11-09 DIAGNOSIS — S0003XA Contusion of scalp, initial encounter: Secondary | ICD-10-CM | POA: Diagnosis not present

## 2021-11-09 DIAGNOSIS — E782 Mixed hyperlipidemia: Secondary | ICD-10-CM | POA: Diagnosis present

## 2021-11-09 DIAGNOSIS — Z7951 Long term (current) use of inhaled steroids: Secondary | ICD-10-CM | POA: Diagnosis not present

## 2021-11-09 DIAGNOSIS — R918 Other nonspecific abnormal finding of lung field: Secondary | ICD-10-CM | POA: Diagnosis not present

## 2021-11-09 DIAGNOSIS — T502X5A Adverse effect of carbonic-anhydrase inhibitors, benzothiadiazides and other diuretics, initial encounter: Secondary | ICD-10-CM | POA: Diagnosis present

## 2021-11-09 DIAGNOSIS — J189 Pneumonia, unspecified organism: Secondary | ICD-10-CM

## 2021-11-09 DIAGNOSIS — F330 Major depressive disorder, recurrent, mild: Secondary | ICD-10-CM | POA: Diagnosis present

## 2021-11-09 DIAGNOSIS — S22080A Wedge compression fracture of T11-T12 vertebra, initial encounter for closed fracture: Secondary | ICD-10-CM

## 2021-11-09 DIAGNOSIS — R2681 Unsteadiness on feet: Secondary | ICD-10-CM | POA: Diagnosis not present

## 2021-11-09 DIAGNOSIS — R42 Dizziness and giddiness: Secondary | ICD-10-CM | POA: Diagnosis not present

## 2021-11-09 DIAGNOSIS — S3993XA Unspecified injury of pelvis, initial encounter: Secondary | ICD-10-CM | POA: Diagnosis not present

## 2021-11-09 DIAGNOSIS — R0689 Other abnormalities of breathing: Secondary | ICD-10-CM | POA: Diagnosis not present

## 2021-11-09 DIAGNOSIS — I152 Hypertension secondary to endocrine disorders: Secondary | ICD-10-CM

## 2021-11-09 DIAGNOSIS — Z8719 Personal history of other diseases of the digestive system: Secondary | ICD-10-CM

## 2021-11-09 DIAGNOSIS — Z7401 Bed confinement status: Secondary | ICD-10-CM | POA: Diagnosis not present

## 2021-11-09 DIAGNOSIS — R531 Weakness: Secondary | ICD-10-CM | POA: Diagnosis not present

## 2021-11-09 DIAGNOSIS — S3992XA Unspecified injury of lower back, initial encounter: Secondary | ICD-10-CM | POA: Diagnosis not present

## 2021-11-09 DIAGNOSIS — N1832 Chronic kidney disease, stage 3b: Secondary | ICD-10-CM

## 2021-11-09 DIAGNOSIS — M6281 Muscle weakness (generalized): Secondary | ICD-10-CM | POA: Diagnosis not present

## 2021-11-09 DIAGNOSIS — M47817 Spondylosis without myelopathy or radiculopathy, lumbosacral region: Secondary | ICD-10-CM | POA: Diagnosis not present

## 2021-11-09 DIAGNOSIS — R58 Hemorrhage, not elsewhere classified: Secondary | ICD-10-CM | POA: Diagnosis not present

## 2021-11-09 DIAGNOSIS — M549 Dorsalgia, unspecified: Secondary | ICD-10-CM | POA: Diagnosis not present

## 2021-11-09 DIAGNOSIS — M47814 Spondylosis without myelopathy or radiculopathy, thoracic region: Secondary | ICD-10-CM | POA: Diagnosis not present

## 2021-11-09 LAB — CBC
HCT: 38.5 % (ref 36.0–46.0)
Hemoglobin: 12.6 g/dL (ref 12.0–15.0)
MCH: 29.2 pg (ref 26.0–34.0)
MCHC: 32.7 g/dL (ref 30.0–36.0)
MCV: 89.1 fL (ref 80.0–100.0)
Platelets: 253 10*3/uL (ref 150–400)
RBC: 4.32 MIL/uL (ref 3.87–5.11)
RDW: 13.8 % (ref 11.5–15.5)
WBC: 15.8 10*3/uL — ABNORMAL HIGH (ref 4.0–10.5)
nRBC: 0 % (ref 0.0–0.2)

## 2021-11-09 LAB — COMPREHENSIVE METABOLIC PANEL
ALT: 20 IU/L (ref 0–32)
AST: 27 IU/L (ref 0–40)
Albumin/Globulin Ratio: 1.7 (ref 1.2–2.2)
Albumin: 4.7 g/dL (ref 3.8–4.8)
Alkaline Phosphatase: 85 IU/L (ref 44–121)
BUN/Creatinine Ratio: 15 (ref 12–28)
BUN: 20 mg/dL (ref 8–27)
Bilirubin Total: 0.4 mg/dL (ref 0.0–1.2)
CO2: 24 mmol/L (ref 20–29)
Calcium: 9.9 mg/dL (ref 8.7–10.3)
Chloride: 96 mmol/L (ref 96–106)
Creatinine, Ser: 1.34 mg/dL — ABNORMAL HIGH (ref 0.57–1.00)
Globulin, Total: 2.8 g/dL (ref 1.5–4.5)
Glucose: 83 mg/dL (ref 70–99)
Potassium: 4.2 mmol/L (ref 3.5–5.2)
Sodium: 137 mmol/L (ref 134–144)
Total Protein: 7.5 g/dL (ref 6.0–8.5)
eGFR: 41 mL/min/{1.73_m2} — ABNORMAL LOW (ref >59)

## 2021-11-09 LAB — TROPONIN I (HIGH SENSITIVITY): Troponin I (High Sensitivity): 15 ng/L (ref <18)

## 2021-11-09 LAB — I-STAT CHEM 8, ED
BUN: 25 mg/dL — ABNORMAL HIGH (ref 8–23)
Calcium, Ion: 0.99 mmol/L — ABNORMAL LOW (ref 1.15–1.40)
Chloride: 101 mmol/L (ref 98–111)
Creatinine, Ser: 1.1 mg/dL — ABNORMAL HIGH (ref 0.44–1.00)
Glucose, Bld: 130 mg/dL — ABNORMAL HIGH (ref 70–99)
HCT: 39 % (ref 36.0–46.0)
Hemoglobin: 13.3 g/dL (ref 12.0–15.0)
Potassium: 4.2 mmol/L (ref 3.5–5.1)
Sodium: 132 mmol/L — ABNORMAL LOW (ref 135–145)
TCO2: 21 mmol/L — ABNORMAL LOW (ref 22–32)

## 2021-11-09 LAB — BRAIN NATRIURETIC PEPTIDE: B Natriuretic Peptide: 32.2 pg/mL (ref 0.0–100.0)

## 2021-11-09 LAB — PROTIME-INR
INR: 1.1 (ref 0.8–1.2)
Prothrombin Time: 14.1 seconds (ref 11.4–15.2)

## 2021-11-09 LAB — ETHANOL: Alcohol, Ethyl (B): <10 mg/dL (ref <10)

## 2021-11-09 MED ORDER — TRAMADOL HCL 50 MG PO TABS
50.0000 mg | ORAL_TABLET | Freq: Two times a day (BID) | ORAL | Status: DC | PRN
  Administered 2021-11-10 – 2021-11-14 (×5): 50 mg via ORAL
  Filled 2021-11-09 (×6): qty 1

## 2021-11-09 MED ORDER — UMECLIDINIUM BROMIDE 62.5 MCG/ACT IN AEPB
1.0000 | INHALATION_SPRAY | Freq: Every day | RESPIRATORY_TRACT | Status: DC
  Administered 2021-11-09 – 2021-11-14 (×5): 1 via RESPIRATORY_TRACT
  Filled 2021-11-09 (×2): qty 7

## 2021-11-09 MED ORDER — ROSUVASTATIN CALCIUM 20 MG PO TABS
40.0000 mg | ORAL_TABLET | Freq: Every day | ORAL | Status: DC
  Administered 2021-11-09 – 2021-11-13 (×5): 40 mg via ORAL
  Filled 2021-11-09 (×5): qty 2

## 2021-11-09 MED ORDER — MONTELUKAST SODIUM 10 MG PO TABS
10.0000 mg | ORAL_TABLET | Freq: Every day | ORAL | Status: DC
  Administered 2021-11-09 – 2021-11-13 (×5): 10 mg via ORAL
  Filled 2021-11-09 (×5): qty 1

## 2021-11-09 MED ORDER — DOXYCYCLINE HYCLATE 100 MG PO TABS: 100.0000 mg | ORAL_TABLET | Freq: Once | ORAL | Status: DC

## 2021-11-09 MED ORDER — PANTOPRAZOLE SODIUM 40 MG PO TBEC
80.0000 mg | DELAYED_RELEASE_TABLET | Freq: Every day | ORAL | Status: DC
  Administered 2021-11-10 – 2021-11-14 (×5): 80 mg via ORAL
  Filled 2021-11-09 (×5): qty 2

## 2021-11-09 MED ORDER — ALBUTEROL SULFATE (2.5 MG/3ML) 0.083% IN NEBU
2.5000 mg | INHALATION_SOLUTION | Freq: Four times a day (QID) | RESPIRATORY_TRACT | Status: DC | PRN
  Administered 2021-11-10 – 2021-11-11 (×2): 2.5 mg via RESPIRATORY_TRACT
  Filled 2021-11-09 (×4): qty 3

## 2021-11-09 MED ORDER — ONDANSETRON HCL 4 MG/2ML IJ SOLN
4.0000 mg | Freq: Four times a day (QID) | INTRAMUSCULAR | Status: DC | PRN
  Administered 2021-11-11: 4 mg via INTRAVENOUS
  Filled 2021-11-09: qty 2

## 2021-11-09 MED ORDER — SODIUM CHLORIDE 0.9 % IV SOLN: 250.0000 mL | INTRAVENOUS | Status: DC | PRN

## 2021-11-09 MED ORDER — SODIUM CHLORIDE 0.9 % IV SOLN
1.0000 g | INTRAVENOUS | Status: DC
  Administered 2021-11-10: 1 g via INTRAVENOUS
  Filled 2021-11-09: qty 10

## 2021-11-09 MED ORDER — FLUTICASONE FUROATE-VILANTEROL 100-25 MCG/ACT IN AEPB
1.0000 | INHALATION_SPRAY | Freq: Every day | RESPIRATORY_TRACT | Status: DC
  Administered 2021-11-09 – 2021-11-14 (×5): 1 via RESPIRATORY_TRACT
  Filled 2021-11-09 (×2): qty 28

## 2021-11-09 MED ORDER — SODIUM CHLORIDE 0.9 % IV SOLN
500.0000 mg | INTRAVENOUS | Status: DC
  Administered 2021-11-09 – 2021-11-10 (×2): 500 mg via INTRAVENOUS
  Filled 2021-11-09: qty 5

## 2021-11-09 MED ORDER — LACTATED RINGERS IV BOLUS
500.0000 mL | Freq: Once | INTRAVENOUS | Status: AC
  Administered 2021-11-09: 500 mL via INTRAVENOUS

## 2021-11-09 MED ORDER — INSULIN ASPART 100 UNIT/ML IJ SOLN
0.0000 [IU] | Freq: Three times a day (TID) | INTRAMUSCULAR | Status: DC
  Administered 2021-11-11 – 2021-11-12 (×2): 1 [IU] via SUBCUTANEOUS

## 2021-11-09 MED ORDER — ACETAMINOPHEN 650 MG RE SUPP: 650.0000 mg | Freq: Four times a day (QID) | RECTAL | Status: DC | PRN

## 2021-11-09 MED ORDER — ACETAMINOPHEN 325 MG PO TABS
650.0000 mg | ORAL_TABLET | Freq: Four times a day (QID) | ORAL | Status: DC | PRN
  Administered 2021-11-12 – 2021-11-13 (×2): 650 mg via ORAL
  Filled 2021-11-09 (×2): qty 2

## 2021-11-09 MED ORDER — SODIUM CHLORIDE 0.9% FLUSH: 3.0000 mL | INTRAVENOUS | Status: DC | PRN

## 2021-11-09 MED ORDER — SODIUM CHLORIDE 0.9% FLUSH
3.0000 mL | Freq: Two times a day (BID) | INTRAVENOUS | Status: DC
  Administered 2021-11-09 – 2021-11-14 (×10): 3 mL via INTRAVENOUS

## 2021-11-09 MED ORDER — METOCLOPRAMIDE HCL 5 MG/ML IJ SOLN
10.0000 mg | Freq: Once | INTRAMUSCULAR | Status: AC
  Administered 2021-11-09: 10 mg via INTRAVENOUS
  Filled 2021-11-09: qty 2

## 2021-11-09 MED ORDER — KETOROLAC TROMETHAMINE 15 MG/ML IJ SOLN
15.0000 mg | Freq: Once | INTRAMUSCULAR | Status: AC
  Administered 2021-11-09: 15 mg via INTRAVENOUS
  Filled 2021-11-09: qty 1

## 2021-11-09 MED ORDER — LIDOCAINE 5 % EX PTCH
1.0000 | MEDICATED_PATCH | CUTANEOUS | Status: DC
  Administered 2021-11-09 – 2021-11-13 (×5): 1 via TRANSDERMAL
  Filled 2021-11-09 (×5): qty 1

## 2021-11-09 MED ORDER — POLYETHYLENE GLYCOL 3350 17 G PO PACK
17.0000 g | PACK | Freq: Every day | ORAL | Status: DC
  Administered 2021-11-09: 17 g via ORAL
  Filled 2021-11-09: qty 1

## 2021-11-09 MED ORDER — CITALOPRAM HYDROBROMIDE 10 MG PO TABS
10.0000 mg | ORAL_TABLET | Freq: Every day | ORAL | Status: DC
  Administered 2021-11-10 – 2021-11-14 (×5): 10 mg via ORAL
  Filled 2021-11-09 (×5): qty 1

## 2021-11-09 MED ORDER — ENOXAPARIN SODIUM 40 MG/0.4ML IJ SOSY
40.0000 mg | PREFILLED_SYRINGE | INTRAMUSCULAR | Status: DC
  Administered 2021-11-09 – 2021-11-13 (×5): 40 mg via SUBCUTANEOUS
  Filled 2021-11-09 (×5): qty 0.4

## 2021-11-09 MED ORDER — FENTANYL CITRATE PF 50 MCG/ML IJ SOSY
25.0000 ug | PREFILLED_SYRINGE | INTRAMUSCULAR | Status: DC | PRN
  Filled 2021-11-09: qty 1

## 2021-11-09 MED ORDER — SODIUM CHLORIDE 0.9 % IV SOLN
500.0000 mg | Freq: Once | INTRAVENOUS | Status: DC
  Filled 2021-11-09: qty 5

## 2021-11-09 MED ORDER — ONDANSETRON HCL 4 MG PO TABS: 4.0000 mg | ORAL_TABLET | Freq: Four times a day (QID) | ORAL | Status: DC | PRN

## 2021-11-09 MED ORDER — AMITRIPTYLINE HCL 25 MG PO TABS
25.0000 mg | ORAL_TABLET | Freq: Every day | ORAL | Status: DC
  Administered 2021-11-09 – 2021-11-13 (×5): 25 mg via ORAL
  Filled 2021-11-09 (×7): qty 1

## 2021-11-09 MED ORDER — MORPHINE SULFATE (PF) 2 MG/ML IV SOLN
1.0000 mg | INTRAVENOUS | Status: DC | PRN
  Administered 2021-11-10 – 2021-11-13 (×6): 1 mg via INTRAVENOUS
  Filled 2021-11-09 (×9): qty 1

## 2021-11-09 MED ORDER — METOPROLOL SUCCINATE ER 25 MG PO TB24
25.0000 mg | ORAL_TABLET | Freq: Every day | ORAL | Status: DC
  Administered 2021-11-10 – 2021-11-14 (×5): 25 mg via ORAL
  Filled 2021-11-09 (×5): qty 1

## 2021-11-09 MED ORDER — SODIUM CHLORIDE 0.9 % IV SOLN
1.0000 g | Freq: Once | INTRAVENOUS | Status: AC
  Administered 2021-11-09: 1 g via INTRAVENOUS
  Filled 2021-11-09: qty 10

## 2021-11-09 NOTE — Assessment & Plan Note (Addendum)
Appears dry to euvolemic.  BNP wnl  Last echo in 2021: normal EF with grade 2 DD. No AS Strict I/O Hold lasix, continue toprol Holding arb-hctz for tomorrow (already took today)

## 2021-11-09 NOTE — Assessment & Plan Note (Signed)
Continue celexa and elavil

## 2021-11-09 NOTE — Assessment & Plan Note (Addendum)
EDP discussed with neurosurgery who recommended TLSO and outpatient follow up Tylenol/tramadol prn for pain Morphine PRN for severe pain She is only wanting to use tylenol  PT to eval

## 2021-11-09 NOTE — ED Notes (Signed)
Patient transported to CT with RN 

## 2021-11-09 NOTE — Progress Notes (Signed)
Orthopedic Tech Progress Note Patient Details:  Diana Hendrix 1945-08-29 [id number]  Ortho Devices Type of Ortho Device: Thoracolumbar corset (TLSO) Ortho Device/Splint Location: BACK Ortho Device/Splint Interventions: Ordered, Application, Adjustment   Post Interventions Patient Tolerated: Well, Fair Instructions Provided: Care of device  Janit Pagan 11/09/2021, 5:15 PM

## 2021-11-09 NOTE — ED Provider Notes (Signed)
[facility] EMERGENCY DEPARTMENT Provider Note   CSN: [id number] Arrival date & time: 11/09/21  1235     History  Chief Complaint  Patient presents with   Diana Hendrix is a 76 y.o. female.  HPI Patient presents for after a syncope and fall.  Medical history includes OSA, GERD, HLD, constipation, CHF, osteopenia, and diabetes.  She was diagnosed with diabetes yesterday.  She has not started any diabetic medications.  She has not been on recent changes to her asthma medications.  She has also been taking a new laxative for treatment of constipation.  She went 3 days without a bowel movement but went to have 1 today.  Prior to using the bathroom, she stated that she felt to be in her normal state of health.  When she went to the bathroom, she experienced nausea.  While sitting on the toilet, she had a syncopal episode.  During this episode, she fell forward.  She struck her forehead and nose.  Husband called EMS.  EMS found her on the bathroom floor, awake.  She endorsed mid back pain.  During transit, she also endorsed neck pain.  She was given 3X 50 mg doses of fentanyl prior to arrival.  She endorsed continued nausea and did receive 8 mg of Zofran.  500 cc IVF given prior to arrival.  Currently, she endorses continued mid back pain.  Vital signs prior to arrival are notable for SBP of 110-130, normal heart rate, and CBG of 160.    Home Medications Prior to Admission medications   Medication Sig Start Date End Date Taking? Authorizing Provider  albuterol (PROVENTIL) (2.5 MG/3ML) 0.083% nebulizer solution Take 3 mLs (2.5 mg total) by nebulization every 6 (six) hours as needed for wheezing or shortness of breath. 11/07/21   Chesley Mires, MD  albuterol (VENTOLIN HFA) 108 (90 Base) MCG/ACT inhaler Inhale 2 puffs into the lungs every 6 (six) hours as needed for wheezing or shortness of breath. 09/06/21   Chesley Mires, MD  amitriptyline (ELAVIL) 25 MG tablet TAKE 1  TABLET(25 MG) BY MOUTH AT BEDTIME 08/08/21   Cox, Kirsten, MD  Cholecalciferol (VITAMIN D3) 50 MCG (2000 UT) TABS Take 1 tablet by mouth daily.    [provider]  citalopram (CELEXA) 10 MG tablet TAKE 1 TABLET(10 MG) BY MOUTH DAILY 10/03/21   Cox, Kirsten, MD  fluticasone (FLONASE) 50 MCG/ACT nasal spray Place 1 spray into both nostrils at bedtime. 09/06/21   Chesley Mires, MD  Fluticasone-Umeclidin-Vilant (TRELEGY ELLIPTA) 100-62.5-25 MCG/ACT AEPB Inhale 1 puff into the lungs daily in the afternoon. 11/07/21   Chesley Mires, MD  Fluticasone-Umeclidin-Vilant (TRELEGY ELLIPTA) 100-62.5-25 MCG/ACT AEPB Inhale 1 puff into the lungs daily. 11/07/21   Chesley Mires, MD  furosemide (LASIX) 20 MG tablet TAKE 1 TABLET(20 MG) BY MOUTH DAILY 09/12/21   Marge Duncans, PA-C  lubiprostone (AMITIZA) 24 MCG capsule Take 1 capsule (24 mcg total) by mouth 2 (two) times daily with a meal. 10/24/21   Cox, Kirsten, MD  metoprolol succinate (TOPROL-XL) 50 MG 24 hr tablet Take 0.5 tablets (25 mg total) by mouth daily. Take with or immediately following a meal. 04/12/21   Cox, Kirsten, MD  montelukast (SINGULAIR) 10 MG tablet TAKE 1 TABLET(10 MG) BY MOUTH AT BEDTIME 07/18/21   Cox, Kirsten, MD  NON FORMULARY CPAP.    [provider]  olmesartan-hydrochlorothiazide (BENICAR HCT) 20-12.5 MG tablet Take 1 tablet by mouth daily. 07/18/21   Rochel Brome,  MD  omeprazole (PRILOSEC) 40 MG capsule Take 1 capsule (40 mg total) by mouth daily. 07/18/21   Cox, Elnita Maxwell, MD  Polyethyl Glycol-Propyl Glycol (SYSTANE OP) Place 1 drop into both eyes 4 (four) times daily.    [provider]  rosuvastatin (CRESTOR) 40 MG tablet TAKE 1 TABLET(40 MG) BY MOUTH DAILY 10/30/21   CoxElnita Maxwell, MD      Allergies    Zyrtec [cetirizine]    Review of Systems   Review of Systems  Gastrointestinal:  Positive for nausea and vomiting.  Musculoskeletal:  Positive for back pain and neck pain.  Neurological:  Positive for syncope.  All  other systems reviewed and are negative.   Physical Exam Updated Vital Signs BP 137/72   Pulse 92   Temp (!) 97.2 F (36.2 C) (Axillary)   Resp 20   SpO2 94%  Physical Exam Vitals and nursing note reviewed.  Constitutional:      General: She is not in acute distress.    Appearance: Normal appearance. She is well-developed. She is not ill-appearing, toxic-appearing or diaphoretic.  HENT:     Head: Normocephalic.     Comments: Bruising to right forehead    Right Ear: External ear normal.     Left Ear: External ear normal.     Nose: No congestion.     Comments: Abrasion to tip of nose.    Mouth/Throat:     Mouth: Mucous membranes are dry.  Eyes:     Extraocular Movements: Extraocular movements intact.     Conjunctiva/sclera: Conjunctivae normal.  Cardiovascular:     Rate and Rhythm: Normal rate and regular rhythm.  Pulmonary:     Effort: Pulmonary effort is normal. No respiratory distress.     Breath sounds: Normal breath sounds. No wheezing or rales.  Chest:     Chest wall: No tenderness.  Abdominal:     Palpations: Abdomen is soft.     Tenderness: There is no abdominal tenderness.  Musculoskeletal:        General: No swelling. Normal range of motion.     Cervical back: Neck supple. No tenderness.     Right lower leg: No edema.     Left lower leg: No edema.  Skin:    General: Skin is warm and dry.     Capillary Refill: Capillary refill takes less than 2 seconds.  Neurological:     General: No focal deficit present.     Mental Status: She is alert and oriented to person, place, and time.     Cranial Nerves: No cranial nerve deficit.     Sensory: No sensory deficit.     Motor: No weakness.     Coordination: Coordination normal.  Psychiatric:        Mood and Affect: Mood normal.        Behavior: Behavior normal.        Thought Content: Thought content normal.        Judgment: Judgment normal.     ED Results / Procedures / Treatments   Labs (all labs ordered  are listed, but only abnormal results are displayed) Labs Reviewed  CBC - Abnormal; Notable for the following components:      Result Value   WBC 15.8 (*)    All other components within normal limits  I-STAT CHEM 8, ED - Abnormal; Notable for the following components:   Sodium 132 (*)    BUN 25 (*)    Creatinine, Ser 1.10 (*)  Glucose, Bld 130 (*)    Calcium, Ion 0.99 (*)    TCO2 21 (*)    All other components within normal limits  CULTURE, BLOOD (ROUTINE X 2)  CULTURE, BLOOD (ROUTINE X 2)  ETHANOL  PROTIME-INR  BRAIN NATRIURETIC PEPTIDE  URINALYSIS, ROUTINE W REFLEX MICROSCOPIC  COMPREHENSIVE METABOLIC PANEL  TROPONIN I (HIGH SENSITIVITY)  TROPONIN I (HIGH SENSITIVITY)    EKG None  Radiology CT T-SPINE NO CHARGE  Result Date: 11/09/2021 CLINICAL DATA:  Fall EXAM: CT Thoracic and Lumbar spine without contrast TECHNIQUE: Multiplanar CT images of the thoracic and lumbar spine were reconstructed from contemporary CT of the Chest, Abdomen, and Pelvis. RADIATION DOSE REDUCTION: This exam was performed according to the departmental dose-optimization program which includes automated exposure control, adjustment of the mA and/or kV according to patient size and/or use of iterative reconstruction technique. COMPARISON:  Same day chest radiograph, two-view chest radiograph 05/20/2019, CT abdomen/pelvis from 2014 FINDINGS: CT THORACIC SPINE FINDINGS Alignment: Normal. Vertebrae: There is an acute compression fracture of the T12 vertebral body with up to approximately 50% loss of vertebral body height centrally and mild bony retropulsion measuring approximately 4 mm. There is no extension into the posterior elements. The other vertebral body heights are preserved, without other acute fracture. There is no suspicious osseous lesion. Paraspinal and other soft tissues: The paraspinal soft tissues are unremarkable. The heart and lungs assessed on the separately dictated CT chest. Disc levels: There  is mild multilevel degenerative endplate change and facet arthropathy throughout the thoracic spine. There is no evidence of significant spinal canal or neural foraminal stenosis. CT LUMBAR SPINE FINDINGS Segmentation: Standard; the lowest formed disc space is designated L5-S1. Alignment: Normal. Vertebrae: Lumbar vertebral body heights are preserved. There is no evidence of acute fracture. There is no suspicious osseous lesion. Paraspinal and other soft tissues: The paraspinal soft tissues are unremarkable. The abdominal and pelvic viscera are assessed on the separately dictated CT abdomen/pelvis. Disc levels: There is disc space narrowing most advanced at L5-S1. There is overall mild multilevel facet arthropathy, also most advanced at L5-S1. Findings result in severe right worse than left neural foraminal stenosis at this level. There is no significant neural foraminal stenosis at the other levels. There is no significant spinal canal stenosis. IMPRESSION: 1. Acute compression fracture of the T12 vertebral body with up to approximately 50% loss of vertebral body height centrally and mild bony retropulsion but no significant spinal canal stenosis. 2. No acute fracture or traumatic malalignment of the lumbar spine. 3. Degenerative changes most advanced at L5-S1 resulting in severe bilateral neural foraminal stenosis. Electronically Signed   By: Valetta Mole M.D.   On: 11/09/2021 15:39   CT L-SPINE NO CHARGE  Result Date: 11/09/2021 CLINICAL DATA:  Fall EXAM: CT Thoracic and Lumbar spine without contrast TECHNIQUE: Multiplanar CT images of the thoracic and lumbar spine were reconstructed from contemporary CT of the Chest, Abdomen, and Pelvis. RADIATION DOSE REDUCTION: This exam was performed according to the departmental dose-optimization program which includes automated exposure control, adjustment of the mA and/or kV according to patient size and/or use of iterative reconstruction technique. COMPARISON:  Same  day chest radiograph, two-view chest radiograph 05/20/2019, CT abdomen/pelvis from 2014 FINDINGS: CT THORACIC SPINE FINDINGS Alignment: Normal. Vertebrae: There is an acute compression fracture of the T12 vertebral body with up to approximately 50% loss of vertebral body height centrally and mild bony retropulsion measuring approximately 4 mm. There is no extension into the posterior elements. The  other vertebral body heights are preserved, without other acute fracture. There is no suspicious osseous lesion. Paraspinal and other soft tissues: The paraspinal soft tissues are unremarkable. The heart and lungs assessed on the separately dictated CT chest. Disc levels: There is mild multilevel degenerative endplate change and facet arthropathy throughout the thoracic spine. There is no evidence of significant spinal canal or neural foraminal stenosis. CT LUMBAR SPINE FINDINGS Segmentation: Standard; the lowest formed disc space is designated L5-S1. Alignment: Normal. Vertebrae: Lumbar vertebral body heights are preserved. There is no evidence of acute fracture. There is no suspicious osseous lesion. Paraspinal and other soft tissues: The paraspinal soft tissues are unremarkable. The abdominal and pelvic viscera are assessed on the separately dictated CT abdomen/pelvis. Disc levels: There is disc space narrowing most advanced at L5-S1. There is overall mild multilevel facet arthropathy, also most advanced at L5-S1. Findings result in severe right worse than left neural foraminal stenosis at this level. There is no significant neural foraminal stenosis at the other levels. There is no significant spinal canal stenosis. IMPRESSION: 1. Acute compression fracture of the T12 vertebral body with up to approximately 50% loss of vertebral body height centrally and mild bony retropulsion but no significant spinal canal stenosis. 2. No acute fracture or traumatic malalignment of the lumbar spine. 3. Degenerative changes most  advanced at L5-S1 resulting in severe bilateral neural foraminal stenosis. Electronically Signed   By: Valetta Mole M.D.   On: 11/09/2021 15:39   CT HEAD WO CONTRAST  Result Date: 11/09/2021 CLINICAL DATA:  Provided history: Facial trauma, blunt. Head trauma, moderate/severe. Polytrauma, blunt. EXAM: CT HEAD WITHOUT CONTRAST CT MAXILLOFACIAL WITHOUT CONTRAST CT CERVICAL SPINE WITHOUT CONTRAST TECHNIQUE: Multidetector CT imaging of the head, cervical spine, and maxillofacial structures were performed using the standard protocol without intravenous contrast. Multiplanar CT image reconstructions of the cervical spine and maxillofacial structures were also generated. RADIATION DOSE REDUCTION: This exam was performed according to the departmental dose-optimization program which includes automated exposure control, adjustment of the mA and/or kV according to patient size and/or use of iterative reconstruction technique. COMPARISON:  Brain MRI 04/16/2017. Head CT 04/16/2017. Cervical spine MRI 01/21/2020. FINDINGS: CT HEAD FINDINGS Brain: Mild generalized cerebral atrophy. Minimal patchy and ill-defined hypoattenuation within the cerebral white matter, nonspecific but compatible with chronic small vessel ischemic disease. There is no acute intracranial hemorrhage. No demarcated cortical infarct. No extra-axial fluid collection. No evidence of an intracranial mass. No midline shift. Vascular: No hyperdense vessel. Atherosclerotic calcifications. Skull: No fracture or aggressive osseous lesion. Other: Anterior scalp/forehead hematoma (predominantly on the right). CT MAXILLOFACIAL FINDINGS Osseous: Acute, displaced fractures of the bilateral nasal bones and frontal process of right maxilla. No other acute maxillofacial fracture is identified. Orbits: Right periorbital hematoma. Sinuses: Small fluid level within the left maxillary sinus. Soft tissues: Right forehead and periorbital hematoma. Soft tissue swelling about the  nose. CT CERVICAL SPINE FINDINGS Alignment: No significant spondylolisthesis. Skull base and vertebrae: The basion-dental and atlanto-dental intervals are maintained.No evidence of acute fracture to the cervical spine. Soft tissues and spinal canal: No prevertebral fluid or swelling. No visible canal hematoma. Disc levels: Cervical spondylosis with multilevel disc space narrowing and disc bulges/central disc protrusions. No appreciable high-grade spinal canal stenosis. No significant bony neural foraminal narrowing. Small ventral osteophyte at C4-C5. Upper chest: No consolidation within the imaged lung apices. No visible pneumothorax. Other: 4 mm nodule within the right thyroid lobe, not meeting consensus criteria for ultrasound follow-up based on size. No follow-up imaging  is recommended. Reference: J Am Coll Radiol. 2015 Feb;12(2): 143-50. IMPRESSION: CT head: 1. No evidence of acute intracranial abnormality. 2. Minimal chronic small vessel ischemic changes within the cerebral white matter. 3. Mild generalized cerebral atrophy. 4. Anterior scalp/forehead hematoma (predominantly on the right). CT maxillofacial: 1. Acute, displaced fractures of the bilateral nasal bones and frontal process of right maxilla. Soft tissue swelling about the nose. 2. Right forehead and periorbital hematoma. 3. Small fluid level within the left maxillary sinus. CT cervical spine: 1. No evidence of acute fracture to the cervical spine. 2. Cervical spondylosis, as described. Electronically Signed   By: Kellie Simmering D.O.   On: 11/09/2021 15:18   CT MAXILLOFACIAL WO CONTRAST  Result Date: 11/09/2021 CLINICAL DATA:  Provided history: Facial trauma, blunt. Head trauma, moderate/severe. Polytrauma, blunt. EXAM: CT HEAD WITHOUT CONTRAST CT MAXILLOFACIAL WITHOUT CONTRAST CT CERVICAL SPINE WITHOUT CONTRAST TECHNIQUE: Multidetector CT imaging of the head, cervical spine, and maxillofacial structures were performed using the standard protocol  without intravenous contrast. Multiplanar CT image reconstructions of the cervical spine and maxillofacial structures were also generated. RADIATION DOSE REDUCTION: This exam was performed according to the departmental dose-optimization program which includes automated exposure control, adjustment of the mA and/or kV according to patient size and/or use of iterative reconstruction technique. COMPARISON:  Brain MRI 04/16/2017. Head CT 04/16/2017. Cervical spine MRI 01/21/2020. FINDINGS: CT HEAD FINDINGS Brain: Mild generalized cerebral atrophy. Minimal patchy and ill-defined hypoattenuation within the cerebral white matter, nonspecific but compatible with chronic small vessel ischemic disease. There is no acute intracranial hemorrhage. No demarcated cortical infarct. No extra-axial fluid collection. No evidence of an intracranial mass. No midline shift. Vascular: No hyperdense vessel. Atherosclerotic calcifications. Skull: No fracture or aggressive osseous lesion. Other: Anterior scalp/forehead hematoma (predominantly on the right). CT MAXILLOFACIAL FINDINGS Osseous: Acute, displaced fractures of the bilateral nasal bones and frontal process of right maxilla. No other acute maxillofacial fracture is identified. Orbits: Right periorbital hematoma. Sinuses: Small fluid level within the left maxillary sinus. Soft tissues: Right forehead and periorbital hematoma. Soft tissue swelling about the nose. CT CERVICAL SPINE FINDINGS Alignment: No significant spondylolisthesis. Skull base and vertebrae: The basion-dental and atlanto-dental intervals are maintained.No evidence of acute fracture to the cervical spine. Soft tissues and spinal canal: No prevertebral fluid or swelling. No visible canal hematoma. Disc levels: Cervical spondylosis with multilevel disc space narrowing and disc bulges/central disc protrusions. No appreciable high-grade spinal canal stenosis. No significant bony neural foraminal narrowing. Small ventral  osteophyte at C4-C5. Upper chest: No consolidation within the imaged lung apices. No visible pneumothorax. Other: 4 mm nodule within the right thyroid lobe, not meeting consensus criteria for ultrasound follow-up based on size. No follow-up imaging is recommended. Reference: J Am Coll Radiol. 2015 Feb;12(2): 143-50. IMPRESSION: CT head: 1. No evidence of acute intracranial abnormality. 2. Minimal chronic small vessel ischemic changes within the cerebral white matter. 3. Mild generalized cerebral atrophy. 4. Anterior scalp/forehead hematoma (predominantly on the right). CT maxillofacial: 1. Acute, displaced fractures of the bilateral nasal bones and frontal process of right maxilla. Soft tissue swelling about the nose. 2. Right forehead and periorbital hematoma. 3. Small fluid level within the left maxillary sinus. CT cervical spine: 1. No evidence of acute fracture to the cervical spine. 2. Cervical spondylosis, as described. Electronically Signed   By: Kellie Simmering D.O.   On: 11/09/2021 15:18   CT CERVICAL SPINE WO CONTRAST  Result Date: 11/09/2021 CLINICAL DATA:  Provided history: Facial trauma, blunt. Head trauma,  moderate/severe. Polytrauma, blunt. EXAM: CT HEAD WITHOUT CONTRAST CT MAXILLOFACIAL WITHOUT CONTRAST CT CERVICAL SPINE WITHOUT CONTRAST TECHNIQUE: Multidetector CT imaging of the head, cervical spine, and maxillofacial structures were performed using the standard protocol without intravenous contrast. Multiplanar CT image reconstructions of the cervical spine and maxillofacial structures were also generated. RADIATION DOSE REDUCTION: This exam was performed according to the departmental dose-optimization program which includes automated exposure control, adjustment of the mA and/or kV according to patient size and/or use of iterative reconstruction technique. COMPARISON:  Brain MRI 04/16/2017. Head CT 04/16/2017. Cervical spine MRI 01/21/2020. FINDINGS: CT HEAD FINDINGS Brain: Mild generalized  cerebral atrophy. Minimal patchy and ill-defined hypoattenuation within the cerebral white matter, nonspecific but compatible with chronic small vessel ischemic disease. There is no acute intracranial hemorrhage. No demarcated cortical infarct. No extra-axial fluid collection. No evidence of an intracranial mass. No midline shift. Vascular: No hyperdense vessel. Atherosclerotic calcifications. Skull: No fracture or aggressive osseous lesion. Other: Anterior scalp/forehead hematoma (predominantly on the right). CT MAXILLOFACIAL FINDINGS Osseous: Acute, displaced fractures of the bilateral nasal bones and frontal process of right maxilla. No other acute maxillofacial fracture is identified. Orbits: Right periorbital hematoma. Sinuses: Small fluid level within the left maxillary sinus. Soft tissues: Right forehead and periorbital hematoma. Soft tissue swelling about the nose. CT CERVICAL SPINE FINDINGS Alignment: No significant spondylolisthesis. Skull base and vertebrae: The basion-dental and atlanto-dental intervals are maintained.No evidence of acute fracture to the cervical spine. Soft tissues and spinal canal: No prevertebral fluid or swelling. No visible canal hematoma. Disc levels: Cervical spondylosis with multilevel disc space narrowing and disc bulges/central disc protrusions. No appreciable high-grade spinal canal stenosis. No significant bony neural foraminal narrowing. Small ventral osteophyte at C4-C5. Upper chest: No consolidation within the imaged lung apices. No visible pneumothorax. Other: 4 mm nodule within the right thyroid lobe, not meeting consensus criteria for ultrasound follow-up based on size. No follow-up imaging is recommended. Reference: J Am Coll Radiol. 2015 Feb;12(2): 143-50. IMPRESSION: CT head: 1. No evidence of acute intracranial abnormality. 2. Minimal chronic small vessel ischemic changes within the cerebral white matter. 3. Mild generalized cerebral atrophy. 4. Anterior  scalp/forehead hematoma (predominantly on the right). CT maxillofacial: 1. Acute, displaced fractures of the bilateral nasal bones and frontal process of right maxilla. Soft tissue swelling about the nose. 2. Right forehead and periorbital hematoma. 3. Small fluid level within the left maxillary sinus. CT cervical spine: 1. No evidence of acute fracture to the cervical spine. 2. Cervical spondylosis, as described. Electronically Signed   By: Kellie Simmering D.O.   On: 11/09/2021 15:18   CT CHEST ABDOMEN PELVIS WO CONTRAST  Result Date: 11/09/2021 CLINICAL DATA:  Fall with back and neck pain. EXAM: CT CHEST, ABDOMEN AND PELVIS WITHOUT CONTRAST TECHNIQUE: Multidetector CT imaging of the chest, abdomen and pelvis was performed following the standard protocol without IV contrast. RADIATION DOSE REDUCTION: This exam was performed according to the departmental dose-optimization program which includes automated exposure control, adjustment of the mA and/or kV according to patient size and/or use of iterative reconstruction technique. COMPARISON:  Same-day chest and pelvis radiographs, two-view chest radiograph 05/20/2019, CT abdomen/pelvis 02/22/2012 FINDINGS: CT CHEST FINDINGS Cardiovascular: The heart size is normal. There is no pericardial effusion. Mitral annular calcifications and mild aortic valve calcifications are noted. Minimal coronary artery calcifications are seen. There is minimal calcified plaque in the otherwise normal thoracic aorta. Mediastinum/Nodes: The thyroid is unremarkable. There is layering fluid in the esophagus. The esophagus is otherwise grossly unremarkable.  There is no mediastinal or axillary lymphadenopathy. There is no bulky hilar adenopathy, within the confines of noncontrast technique. There is no mediastinal hematoma. Lungs/Pleura: The trachea and central airways are patent. Opacities in the dependent lower lobes most likely reflect subsegmental atelectasis. There are patchy ground-glass  opacities in both upper lobes and more notably in the right middle lobe. There are no suspicious nodules. Musculoskeletal: There is no rib or sternal fracture. There is an acute compression fracture of the T12 vertebral body with bony retropulsion, assessed on the separately dictated thoracic spine CT. CT ABDOMEN PELVIS FINDINGS Hepatobiliary: The liver and gallbladder are unremarkable. There is no evidence of traumatic parenchymal injury. There is no biliary ductal dilatation. Pancreas: Unremarkable. No evidence of traumatic parenchymal injury. Spleen: Unremarkable.  No evidence of traumatic parenchymal injury. Adrenals/Urinary Tract: The adrenals are unremarkable. The kidneys are unremarkable, with no focal lesion (within the confines of noncontrast technique), stone, hydronephrosis, hydroureter, or traumatic parenchymal injury. The bladder is unremarkable. Stomach/Bowel: The stomach is unremarkable. There is no evidence of bowel obstruction. There is no abnormal bowel wall thickening or inflammatory change. Vascular/Lymphatic: There is calcified plaque throughout the nonaneurysmal abdominal aorta. There is no abdominopelvic lymphadenopathy. Reproductive: Uterus and adnexa are unremarkable. Other: There is no ascites or free air. Musculoskeletal: There is no ascites or free air. No acute fracture or suspicious osseous lesion is seen. The lumbar spine is assessed in full on the separately dictated CT lumbar spine. IMPRESSION: 1. Acute compression fracture of the T12 vertebral body with mild bony retropulsion. Please also refer to the separately dictated CT of the thoracic and lumbar spine. 2. Otherwise, no evidence of acute traumatic injury in the chest, abdomen, or pelvis. 3. Patchy ground-glass opacities in the lungs most notably in the right middle lobe may reflect multifocal infection/inflammation or aspiration. 4. Layering fluid in the esophagus may reflect reflux. Electronically Signed   By: Valetta Mole M.D.    On: 11/09/2021 15:08   DG Pelvis Portable  Result Date: 11/09/2021 CLINICAL DATA:  Trauma, fall EXAM: PORTABLE PELVIS 1-2 VIEWS COMPARISON:  None Available. FINDINGS: No displaced fracture or dislocation is seen. Greater trochanter of proximal right femur is not included in its entirety. SI joints are symmetrical. Pubic symphysis is unremarkable. IMPRESSION: No recent fracture or dislocation is seen in the limited AP portable views of pelvis. Electronically Signed   By: Elmer Picker M.D.   On: 11/09/2021 13:57   DG Chest Port 1 View  Result Date: 11/09/2021 CLINICAL DATA:  Trauma, fall EXAM: PORTABLE CHEST 1 VIEW COMPARISON:  09/06/2021 FINDINGS: Transverse diameter of heart is slightly increased. Prominence of central pulmonary vessels may be due to supine position. There are no signs of pulmonary edema or focal consolidation. There is no significant pleural effusion or pneumothorax. IMPRESSION: No active disease. Electronically Signed   By: Elmer Picker M.D.   On: 11/09/2021 13:52    Procedures Procedures    Medications Ordered in ED Medications  fentaNYL (SUBLIMAZE) injection 25 mcg (has no administration in time range)  ketorolac (TORADOL) 15 MG/ML injection 15 mg (has no administration in time range)  lidocaine (LIDODERM) 5 % 1 patch (has no administration in time range)  cefTRIAXone (ROCEPHIN) 1 g in sodium chloride 0.9 % 100 mL IVPB (has no administration in time range)  azithromycin (ZITHROMAX) 500 mg in sodium chloride 0.9 % 250 mL IVPB (has no administration in time range)  lactated ringers bolus 500 mL (500 mLs Intravenous New Bag/Given 11/09/21 1437)  metoCLOPramide (REGLAN) injection 10 mg (10 mg Intravenous Given 11/09/21 1435)    ED Course/ Medical Decision Making/ A&P                           Medical Decision Making Amount and/or Complexity of Data Reviewed Labs: ordered. Radiology: ordered. ECG/medicine tests: ordered.  Risk Prescription drug  management.   This patient presents to the ED for concern of syncope and fall, this involves an extensive number of treatment options, and is a complaint that carries with it a high risk of complications and morbidity.  The differential diagnosis includes vasovagal episode, arrhythmia, dehydration, infection, metabolic abnormalities, acute injuries   Co morbidities that complicate the patient evaluation  OSA, GERD, HLD, constipation, CHF, osteopenia, and diabetes   Additional history obtained:  Additional history obtained from EMS External records from outside source obtained and reviewed including EMR   Lab Tests:  I Ordered, and personally interpreted labs.  The pertinent results include: A leukocytosis is present.  I-STAT shows normal kidney function, normal glucose, and grossly normal electrolytes.  Remaining lab work pending at time of signout.   Imaging Studies ordered:  I ordered imaging studies including x-ray of chest and pelvis, CT scan of head, cervical spine, face, chest, abdomen, pelvis, T-spine, L-spine I independently visualized and interpreted imaging which showed displaced fractures of bilateral nasal bones and frontal process of right maxilla; compression fracture of T12 with mild bony retropulsion; patchy glass opacities in the lungs concerning for pneumonia I agree with the radiologist interpretation   Cardiac Monitoring: / EKG:  The patient was maintained on a cardiac monitor.  I personally viewed and interpreted the cardiac monitored which showed an underlying rhythm of: Sinus rhythm   Consultations Obtained:  I requested consultation with the neurosurgeon, Dr. Christella Noa,  and discussed lab and imaging findings as well as pertinent plan - they recommend: TLSO brace and outpatient follow-up   Problem List / ED Course / Critical interventions / Medication management  Patient presents after syncopal episode.  This occurred shortly prior to arrival.  At the  time, she was in the bathroom having a bowel movement.  She had nausea at that presented prior to having a bowel movement and worsened while she was on the toilet.  She subsequently lost consciousness.  When she lost consciousness, she fell forward striking her forehead.  She regained consciousness when on the floor and was able to lift her self up in position herself onto her back.  Because of the fall, her phone alerted both her husband and EMS.  When patient's husband was outside at the time.  When husband and EMS found her in the bathroom, she was slumped on the floor but awake.  Patient has complaints of pain, primarily in her mid back.  She received 150 mcg of fentanyl prior to arrival.  She has also had ongoing nausea and received a milligrams of Zofran.  Patient is awake and alert on arrival.  She has bruising to her right forehead, swelling and dried blood to her nose, mild swelling to her right periorbital area, and tenderness to her mid back.  She has no focal neurologic deficits, including her bilateral legs.  Additional as needed fentanyl was ordered.  Patient to undergo lab work and trauma imaging.  While in the ED, she endorsed continued nausea.  She was given Reglan with good effects.  She continued to have pain.  CT imaging revealed T12 compression  fracture in addition to nasal and right maxillary fractures.  Toradol and lidocaine patch ordered for multimodal pain control.  I spoke with neurosurgeon on-call, Dr. Christella Noa, who recommends TLSO brace and outpatient follow-up.  TLSO brace was ordered.  Incidental findings on her imaging with patchy lung opacities consistent with multifocal pneumonia.  Patient does endorse that she has had cough and shortness of breath that has been ongoing for several weeks.  This is the reason why she has recently changed some of her asthma medications.  Lab work shows a leukocytosis, further raising suspicion of pneumonia, although some this may be elevated due to her  acute traumatic injuries.  While in the ED, she was hypoxic.  She was placed on supplemental oxygen.  She is not on supplemental oxygen at baseline.  We will treat empirically for pneumonia.  Given her acute hypoxic respiratory failure, patient will be admitted.  At time of signout, remaining lab work was pending.  Care of patient was signed out to oncoming ED provider. I ordered medication including fentanyl, Toradol, and lidocaine patch for analgesia; Reglan for nausea; antibiotics for empiric treatment of pneumonia; IV fluids for hydration Reevaluation of the patient after these medicines showed that the patient improved I have reviewed the patients home medicines and have made adjustments as needed   Social Determinants of Health:  Lives at home with husband  CRITICAL CARE Performed by: Godfrey Pick   Total critical care time: 35 minutes  Critical care time was exclusive of separately billable procedures and treating other patients.  Critical care was necessary to treat or prevent imminent or life-threatening deterioration.  Critical care was time spent personally by me on the following activities: development of treatment plan with patient and/or surrogate as well as nursing, discussions with consultants, evaluation of patient's response to treatment, examination of patient, obtaining history from patient or surrogate, ordering and performing treatments and interventions, ordering and review of laboratory studies, ordering and review of radiographic studies, pulse oximetry and re-evaluation of patient's condition.         Final Clinical Impression(s) / ED Diagnoses Final diagnoses:  Acute respiratory failure with hypoxia (HCC)  Closed fracture of nasal bone, initial encounter  Closed fracture of right side of maxilla, initial encounter ([place])  Compression fracture of T12 vertebra, initial encounter [facility])  Multifocal pneumonia    Rx / DC Orders ED Discharge Orders     None          Godfrey Pick, MD 11/09/21 1621

## 2021-11-09 NOTE — Assessment & Plan Note (Addendum)
76 year old presenting after a syncopal event with fall found to have acute respiratory failure with hypoxia down to 77% on RA and now having a new oxygen requirement of 2L to maintain oxygenation -admit to telemetry -concern for aspiration in setting of syncopal event, emesis and new oxygen requirement  -check PCT and urinary antigens -continue rocephin/azithromycin for now as will cover for aspiration with rocephin -vbg reassuring -no s/s of asthma exacerbation or CHF exacerbation  -wean as tolerated

## 2021-11-09 NOTE — Assessment & Plan Note (Signed)
>>  ASSESSMENT AND PLAN FOR ESSENTIAL HYPERTENSION WRITTEN ON 11/09/2021  7:13 PM BY WOLFE, ALLISON, MD  Well controlled Continue toprol xl  Hold home hctz-benicar and lasix with syncope -hyponatremia likely secondary to hctz

## 2021-11-09 NOTE — Assessment & Plan Note (Signed)
Baseline of 1.1-1.2, stable

## 2021-11-09 NOTE — ED Triage Notes (Signed)
Pt BIB [facility] from home after falling off the toilet and hitting head. Pt c/o back and neck pain, has obvious lac to nose, and right orbital edema. Pt received 151mg fent and '8mg'$  zofran during transport. Patient A&Ox4, only hx diabetes and HTN. Pt is ambulatory and independent at baseline.

## 2021-11-09 NOTE — Assessment & Plan Note (Signed)
Reactive vs. Possible pneumonia Continue. Received 500cc IVF in ED Trend and follow fever curve

## 2021-11-09 NOTE — Assessment & Plan Note (Signed)
Continue cpap at night  

## 2021-11-09 NOTE — Assessment & Plan Note (Signed)
Continue crestor 

## 2021-11-09 NOTE — H&P (Signed)
History and Physical    Patient: Diana Hendrix DOB: 1945/06/08 DOA: 11/09/2021 DOS: the patient was seen and examined on 11/09/2021 PCP: Rochel Brome, MD  Patient coming from: Home - lives with her husband.    Chief Complaint: syncope and fall   HPI: Diana Hendrix is a 76 y.o. female with medical history significant of OSA, HTN, Z1IW, HLD, diastolic CHF, GERD, vasovagal syncope who presented to ED after a syncopal episode and fall at home. She was constipated and had a bowel movement and after she had a BM she felt light headed and dizzy. She has a history of vasovagal syncope and she knew that was going on.  She sat on the toilet for a while but ended up passing out anway. She likely fell forward off toilet and fell flat on her face. She also vomited while she was passed out. She woke up and had some vomit to the side of her face. She was able to turn over and had called 911. Her phone also alerted her son.  She is unsure how long she was out for, but was on the ground for 30 minutes.  She denies any prodromal chest pain or palpitations. She started to feel nauseated, diaphoretic and then passed out. She is on no blood thinner or ASA. She has some back pain, but it is controlled at this time.   She has not been feeling good. Feels like she has had chronic congestion and shortness of breath and was diagnosed with asthma by her pulmonologist about 2 months ago.   Denies any fever/chills, vision changes/headaches, chest pain or palpitations, cough, abdominal pain, N/V/D, dysuria or leg swelling.    She does not smoke or drink.    ER Course:  vitals: temp: 97.2, bp: 135/66, HR: 86, RR: 19, oxygen: 97%RA Pertinent labs: wbc: 15.8, BUN: 25, creatinine: 1.10,  CXR: no acute finding Pelvis xray: no fracture CT head: no acute finding. Anterior scalp/forehead hematoma.  CT maxillofacial: Acute, displaced fractures of the bilateral nasal bones and frontal process of right  maxilla. Soft tissue swelling about the nose. Right forehead and periorbital hematoma. Small fluid level within the left maxillary sinus. CT cervical spine: no acute fracture CT chest/abdo/pelvis: acute compression fx of T12 vertebral body with mild bony retropulsion. Patchy ground-glass opacities in the lungs, most notably RML may reflect multifocal infection/inflammation or aspiration.  CT t spine: Acute compression fracture of the T12 vertebral body with up to approximately 50% loss of vertebral body height centrally and mild bony retropulsion but no significant spinal canal stenosis. Ct lumbar spine: Degenerative changes most advanced at L5-S1 resulting in severe bilateral neural foraminal stenosis. In ED: given 500 cc bolus, reglan, toradol, rocephin and azithromycin. NS consulted. Recommended TLSO brace and f/u outpatient. TRH asked to admit.   Review of Systems: As mentioned in the history of present illness. All other systems reviewed and are negative. Past Medical History:  Diagnosis Date   Cancer ([place])    Diastolic heart failure ([facility])    Echo EF 60/65% Heart monitor was normal   GERD (gastroesophageal reflux disease)    History of colon polyps    Hyperlipidemia    Hypertension    Macular edema, cystoid    OSA (obstructive sleep apnea) 09/17/2012   Osteopenia    Plantar fasciitis 09/09/2019   Past Surgical History:  Procedure Laterality Date   CESAREAN SECTION     COLONOSCOPY  10/29/2013   Colonic polyp status post polypectomy.  Mild sigmoid diverticulosis. Small internal hemorrhoids   FOOT NEUROMA SURGERY     s/p surgery in both feet   FOOT SURGERY Bilateral    Per patient   MENISCUS REPAIR Left 09/13/2016   miniscus repair right  Right 2019   SHOULDER SURGERY Left    arthroscopy.    TUBAL LIGATION     Social History:  reports that she has never smoked. She has never used smokeless tobacco. She reports that she does not drink alcohol and does not use drugs.  Allergies   Allergen Reactions   Zyrtec [Cetirizine]     Weird dreams.     Family History  Problem Relation Age of Onset   Hyperlipidemia Mother    Heart disease Mother    Hypertension Mother    Atrial fibrillation Mother    CAD Mother    Congestive Heart Failure Mother    CVA Father    Hypertension Brother    Hypertension Son    Colon cancer Neg Hx    Esophageal cancer Neg Hx     Prior to Admission medications   Medication Sig Start Date End Date Taking? Authorizing Provider  albuterol (PROVENTIL) (2.5 MG/3ML) 0.083% nebulizer solution Take 3 mLs (2.5 mg total) by nebulization every 6 (six) hours as needed for wheezing or shortness of breath. 11/07/21   Chesley Mires, MD  albuterol (VENTOLIN HFA) 108 (90 Base) MCG/ACT inhaler Inhale 2 puffs into the lungs every 6 (six) hours as needed for wheezing or shortness of breath. 09/06/21   Chesley Mires, MD  amitriptyline (ELAVIL) 25 MG tablet TAKE 1 TABLET(25 MG) BY MOUTH AT BEDTIME 08/08/21   Cox, Kirsten, MD  Cholecalciferol (VITAMIN D3) 50 MCG (2000 UT) TABS Take 1 tablet by mouth daily.    [provider]  citalopram (CELEXA) 10 MG tablet TAKE 1 TABLET(10 MG) BY MOUTH DAILY 10/03/21   Cox, Kirsten, MD  fluticasone (FLONASE) 50 MCG/ACT nasal spray Place 1 spray into both nostrils at bedtime. 09/06/21   Chesley Mires, MD  Fluticasone-Umeclidin-Vilant (TRELEGY ELLIPTA) 100-62.5-25 MCG/ACT AEPB Inhale 1 puff into the lungs daily in the afternoon. 11/07/21   Chesley Mires, MD  Fluticasone-Umeclidin-Vilant (TRELEGY ELLIPTA) 100-62.5-25 MCG/ACT AEPB Inhale 1 puff into the lungs daily. 11/07/21   Chesley Mires, MD  furosemide (LASIX) 20 MG tablet TAKE 1 TABLET(20 MG) BY MOUTH DAILY 09/12/21   Marge Duncans, PA-C  lubiprostone (AMITIZA) 24 MCG capsule Take 1 capsule (24 mcg total) by mouth 2 (two) times daily with a meal. 10/24/21   Cox, Kirsten, MD  metoprolol succinate (TOPROL-XL) 50 MG 24 hr tablet Take 0.5 tablets (25 mg total) by mouth daily. Take with  or immediately following a meal. 04/12/21   Cox, Kirsten, MD  montelukast (SINGULAIR) 10 MG tablet TAKE 1 TABLET(10 MG) BY MOUTH AT BEDTIME 07/18/21   Cox, Kirsten, MD  NON FORMULARY CPAP.    [provider]  olmesartan-hydrochlorothiazide (BENICAR HCT) 20-12.5 MG tablet Take 1 tablet by mouth daily. 07/18/21   Cox, Elnita Maxwell, MD  omeprazole (PRILOSEC) 40 MG capsule Take 1 capsule (40 mg total) by mouth daily. 07/18/21   Cox, Elnita Maxwell, MD  Polyethyl Glycol-Propyl Glycol (SYSTANE OP) Place 1 drop into both eyes 4 (four) times daily.    [provider]  rosuvastatin (CRESTOR) 40 MG tablet TAKE 1 TABLET(40 MG) BY MOUTH DAILY 10/30/21   Rochel Brome, MD    Physical Exam: Vitals:   11/09/21 1445 11/09/21 1500 11/09/21 1530 11/09/21 1700  BP: (!) 143/70 Marland Kitchen)  150/68 137/72 137/71  Pulse: 87 (!) 117 92 95  Resp: '19 17 20 20  '$ Temp:      TempSrc:      SpO2: 97% (!) 77% 94% 95%   General:  Appears calm and comfortable and is in NAD Eyes:  PERRL, EOMI, normal lids, iris. Right eye with periorbital edema and bruising.  ENT:  grossly normal hearing, lips & tongue, mmm; appropriate dentition. Dried blood in nose Neck:  no LAD, masses or thyromegaly; no carotid bruits Cardiovascular:  RRR, no m/r/g. No LE edema.  Respiratory:   CTA bilaterally with no wheezes/rales/rhonchi.  Normal respiratory effort. Abdomen:  soft, NT, ND, NABS Back:   normal alignment, no CVAT Skin:  no rash or induration seen on limited exam Musculoskeletal:  grossly normal tone BUE/BLE, good ROM, no bony abnormality Lower extremity:  No LE edema.  Limited foot exam with no ulcerations.  2+ distal pulses. Psychiatric:  grossly normal mood and affect, speech fluent and appropriate, AOx3 Neurologic:  CN 2-12 grossly intact, moves all extremities in coordinated fashion, sensation intact   Radiological Exams on Admission: Independently reviewed - see discussion in A/P where applicable  CT T-SPINE NO CHARGE  Result Date:  11/09/2021 CLINICAL DATA:  Fall EXAM: CT Thoracic and Lumbar spine without contrast TECHNIQUE: Multiplanar CT images of the thoracic and lumbar spine were reconstructed from contemporary CT of the Chest, Abdomen, and Pelvis. RADIATION DOSE REDUCTION: This exam was performed according to the departmental dose-optimization program which includes automated exposure control, adjustment of the mA and/or kV according to patient size and/or use of iterative reconstruction technique. COMPARISON:  Same day chest radiograph, two-view chest radiograph 05/20/2019, CT abdomen/pelvis from 2014 FINDINGS: CT THORACIC SPINE FINDINGS Alignment: Normal. Vertebrae: There is an acute compression fracture of the T12 vertebral body with up to approximately 50% loss of vertebral body height centrally and mild bony retropulsion measuring approximately 4 mm. There is no extension into the posterior elements. The other vertebral body heights are preserved, without other acute fracture. There is no suspicious osseous lesion. Paraspinal and other soft tissues: The paraspinal soft tissues are unremarkable. The heart and lungs assessed on the separately dictated CT chest. Disc levels: There is mild multilevel degenerative endplate change and facet arthropathy throughout the thoracic spine. There is no evidence of significant spinal canal or neural foraminal stenosis. CT LUMBAR SPINE FINDINGS Segmentation: Standard; the lowest formed disc space is designated L5-S1. Alignment: Normal. Vertebrae: Lumbar vertebral body heights are preserved. There is no evidence of acute fracture. There is no suspicious osseous lesion. Paraspinal and other soft tissues: The paraspinal soft tissues are unremarkable. The abdominal and pelvic viscera are assessed on the separately dictated CT abdomen/pelvis. Disc levels: There is disc space narrowing most advanced at L5-S1. There is overall mild multilevel facet arthropathy, also most advanced at L5-S1. Findings result  in severe right worse than left neural foraminal stenosis at this level. There is no significant neural foraminal stenosis at the other levels. There is no significant spinal canal stenosis. IMPRESSION: 1. Acute compression fracture of the T12 vertebral body with up to approximately 50% loss of vertebral body height centrally and mild bony retropulsion but no significant spinal canal stenosis. 2. No acute fracture or traumatic malalignment of the lumbar spine. 3. Degenerative changes most advanced at L5-S1 resulting in severe bilateral neural foraminal stenosis. Electronically Signed   By: Valetta Mole M.D.   On: 11/09/2021 15:39   CT L-SPINE NO CHARGE  Result Date: 11/09/2021  CLINICAL DATA:  Fall EXAM: CT Thoracic and Lumbar spine without contrast TECHNIQUE: Multiplanar CT images of the thoracic and lumbar spine were reconstructed from contemporary CT of the Chest, Abdomen, and Pelvis. RADIATION DOSE REDUCTION: This exam was performed according to the departmental dose-optimization program which includes automated exposure control, adjustment of the mA and/or kV according to patient size and/or use of iterative reconstruction technique. COMPARISON:  Same day chest radiograph, two-view chest radiograph 05/20/2019, CT abdomen/pelvis from 2014 FINDINGS: CT THORACIC SPINE FINDINGS Alignment: Normal. Vertebrae: There is an acute compression fracture of the T12 vertebral body with up to approximately 50% loss of vertebral body height centrally and mild bony retropulsion measuring approximately 4 mm. There is no extension into the posterior elements. The other vertebral body heights are preserved, without other acute fracture. There is no suspicious osseous lesion. Paraspinal and other soft tissues: The paraspinal soft tissues are unremarkable. The heart and lungs assessed on the separately dictated CT chest. Disc levels: There is mild multilevel degenerative endplate change and facet arthropathy throughout the  thoracic spine. There is no evidence of significant spinal canal or neural foraminal stenosis. CT LUMBAR SPINE FINDINGS Segmentation: Standard; the lowest formed disc space is designated L5-S1. Alignment: Normal. Vertebrae: Lumbar vertebral body heights are preserved. There is no evidence of acute fracture. There is no suspicious osseous lesion. Paraspinal and other soft tissues: The paraspinal soft tissues are unremarkable. The abdominal and pelvic viscera are assessed on the separately dictated CT abdomen/pelvis. Disc levels: There is disc space narrowing most advanced at L5-S1. There is overall mild multilevel facet arthropathy, also most advanced at L5-S1. Findings result in severe right worse than left neural foraminal stenosis at this level. There is no significant neural foraminal stenosis at the other levels. There is no significant spinal canal stenosis. IMPRESSION: 1. Acute compression fracture of the T12 vertebral body with up to approximately 50% loss of vertebral body height centrally and mild bony retropulsion but no significant spinal canal stenosis. 2. No acute fracture or traumatic malalignment of the lumbar spine. 3. Degenerative changes most advanced at L5-S1 resulting in severe bilateral neural foraminal stenosis. Electronically Signed   By: Valetta Mole M.D.   On: 11/09/2021 15:39   CT HEAD WO CONTRAST  Result Date: 11/09/2021 CLINICAL DATA:  Provided history: Facial trauma, blunt. Head trauma, moderate/severe. Polytrauma, blunt. EXAM: CT HEAD WITHOUT CONTRAST CT MAXILLOFACIAL WITHOUT CONTRAST CT CERVICAL SPINE WITHOUT CONTRAST TECHNIQUE: Multidetector CT imaging of the head, cervical spine, and maxillofacial structures were performed using the standard protocol without intravenous contrast. Multiplanar CT image reconstructions of the cervical spine and maxillofacial structures were also generated. RADIATION DOSE REDUCTION: This exam was performed according to the departmental  dose-optimization program which includes automated exposure control, adjustment of the mA and/or kV according to patient size and/or use of iterative reconstruction technique. COMPARISON:  Brain MRI 04/16/2017. Head CT 04/16/2017. Cervical spine MRI 01/21/2020. FINDINGS: CT HEAD FINDINGS Brain: Mild generalized cerebral atrophy. Minimal patchy and ill-defined hypoattenuation within the cerebral white matter, nonspecific but compatible with chronic small vessel ischemic disease. There is no acute intracranial hemorrhage. No demarcated cortical infarct. No extra-axial fluid collection. No evidence of an intracranial mass. No midline shift. Vascular: No hyperdense vessel. Atherosclerotic calcifications. Skull: No fracture or aggressive osseous lesion. Other: Anterior scalp/forehead hematoma (predominantly on the right). CT MAXILLOFACIAL FINDINGS Osseous: Acute, displaced fractures of the bilateral nasal bones and frontal process of right maxilla. No other acute maxillofacial fracture is identified. Orbits: Right periorbital hematoma.  Sinuses: Small fluid level within the left maxillary sinus. Soft tissues: Right forehead and periorbital hematoma. Soft tissue swelling about the nose. CT CERVICAL SPINE FINDINGS Alignment: No significant spondylolisthesis. Skull base and vertebrae: The basion-dental and atlanto-dental intervals are maintained.No evidence of acute fracture to the cervical spine. Soft tissues and spinal canal: No prevertebral fluid or swelling. No visible canal hematoma. Disc levels: Cervical spondylosis with multilevel disc space narrowing and disc bulges/central disc protrusions. No appreciable high-grade spinal canal stenosis. No significant bony neural foraminal narrowing. Small ventral osteophyte at C4-C5. Upper chest: No consolidation within the imaged lung apices. No visible pneumothorax. Other: 4 mm nodule within the right thyroid lobe, not meeting consensus criteria for ultrasound follow-up based  on size. No follow-up imaging is recommended. Reference: J Am Coll Radiol. 2015 Feb;12(2): 143-50. IMPRESSION: CT head: 1. No evidence of acute intracranial abnormality. 2. Minimal chronic small vessel ischemic changes within the cerebral white matter. 3. Mild generalized cerebral atrophy. 4. Anterior scalp/forehead hematoma (predominantly on the right). CT maxillofacial: 1. Acute, displaced fractures of the bilateral nasal bones and frontal process of right maxilla. Soft tissue swelling about the nose. 2. Right forehead and periorbital hematoma. 3. Small fluid level within the left maxillary sinus. CT cervical spine: 1. No evidence of acute fracture to the cervical spine. 2. Cervical spondylosis, as described. Electronically Signed   By: Kellie Simmering D.O.   On: 11/09/2021 15:18   CT MAXILLOFACIAL WO CONTRAST  Result Date: 11/09/2021 CLINICAL DATA:  Provided history: Facial trauma, blunt. Head trauma, moderate/severe. Polytrauma, blunt. EXAM: CT HEAD WITHOUT CONTRAST CT MAXILLOFACIAL WITHOUT CONTRAST CT CERVICAL SPINE WITHOUT CONTRAST TECHNIQUE: Multidetector CT imaging of the head, cervical spine, and maxillofacial structures were performed using the standard protocol without intravenous contrast. Multiplanar CT image reconstructions of the cervical spine and maxillofacial structures were also generated. RADIATION DOSE REDUCTION: This exam was performed according to the departmental dose-optimization program which includes automated exposure control, adjustment of the mA and/or kV according to patient size and/or use of iterative reconstruction technique. COMPARISON:  Brain MRI 04/16/2017. Head CT 04/16/2017. Cervical spine MRI 01/21/2020. FINDINGS: CT HEAD FINDINGS Brain: Mild generalized cerebral atrophy. Minimal patchy and ill-defined hypoattenuation within the cerebral white matter, nonspecific but compatible with chronic small vessel ischemic disease. There is no acute intracranial hemorrhage. No  demarcated cortical infarct. No extra-axial fluid collection. No evidence of an intracranial mass. No midline shift. Vascular: No hyperdense vessel. Atherosclerotic calcifications. Skull: No fracture or aggressive osseous lesion. Other: Anterior scalp/forehead hematoma (predominantly on the right). CT MAXILLOFACIAL FINDINGS Osseous: Acute, displaced fractures of the bilateral nasal bones and frontal process of right maxilla. No other acute maxillofacial fracture is identified. Orbits: Right periorbital hematoma. Sinuses: Small fluid level within the left maxillary sinus. Soft tissues: Right forehead and periorbital hematoma. Soft tissue swelling about the nose. CT CERVICAL SPINE FINDINGS Alignment: No significant spondylolisthesis. Skull base and vertebrae: The basion-dental and atlanto-dental intervals are maintained.No evidence of acute fracture to the cervical spine. Soft tissues and spinal canal: No prevertebral fluid or swelling. No visible canal hematoma. Disc levels: Cervical spondylosis with multilevel disc space narrowing and disc bulges/central disc protrusions. No appreciable high-grade spinal canal stenosis. No significant bony neural foraminal narrowing. Small ventral osteophyte at C4-C5. Upper chest: No consolidation within the imaged lung apices. No visible pneumothorax. Other: 4 mm nodule within the right thyroid lobe, not meeting consensus criteria for ultrasound follow-up based on size. No follow-up imaging is recommended. Reference: J Am Coll Radiol. 2015  Feb;12(2): 143-50. IMPRESSION: CT head: 1. No evidence of acute intracranial abnormality. 2. Minimal chronic small vessel ischemic changes within the cerebral white matter. 3. Mild generalized cerebral atrophy. 4. Anterior scalp/forehead hematoma (predominantly on the right). CT maxillofacial: 1. Acute, displaced fractures of the bilateral nasal bones and frontal process of right maxilla. Soft tissue swelling about the nose. 2. Right forehead and  periorbital hematoma. 3. Small fluid level within the left maxillary sinus. CT cervical spine: 1. No evidence of acute fracture to the cervical spine. 2. Cervical spondylosis, as described. Electronically Signed   By: Kellie Simmering D.O.   On: 11/09/2021 15:18   CT CERVICAL SPINE WO CONTRAST  Result Date: 11/09/2021 CLINICAL DATA:  Provided history: Facial trauma, blunt. Head trauma, moderate/severe. Polytrauma, blunt. EXAM: CT HEAD WITHOUT CONTRAST CT MAXILLOFACIAL WITHOUT CONTRAST CT CERVICAL SPINE WITHOUT CONTRAST TECHNIQUE: Multidetector CT imaging of the head, cervical spine, and maxillofacial structures were performed using the standard protocol without intravenous contrast. Multiplanar CT image reconstructions of the cervical spine and maxillofacial structures were also generated. RADIATION DOSE REDUCTION: This exam was performed according to the departmental dose-optimization program which includes automated exposure control, adjustment of the mA and/or kV according to patient size and/or use of iterative reconstruction technique. COMPARISON:  Brain MRI 04/16/2017. Head CT 04/16/2017. Cervical spine MRI 01/21/2020. FINDINGS: CT HEAD FINDINGS Brain: Mild generalized cerebral atrophy. Minimal patchy and ill-defined hypoattenuation within the cerebral white matter, nonspecific but compatible with chronic small vessel ischemic disease. There is no acute intracranial hemorrhage. No demarcated cortical infarct. No extra-axial fluid collection. No evidence of an intracranial mass. No midline shift. Vascular: No hyperdense vessel. Atherosclerotic calcifications. Skull: No fracture or aggressive osseous lesion. Other: Anterior scalp/forehead hematoma (predominantly on the right). CT MAXILLOFACIAL FINDINGS Osseous: Acute, displaced fractures of the bilateral nasal bones and frontal process of right maxilla. No other acute maxillofacial fracture is identified. Orbits: Right periorbital hematoma. Sinuses: Small fluid  level within the left maxillary sinus. Soft tissues: Right forehead and periorbital hematoma. Soft tissue swelling about the nose. CT CERVICAL SPINE FINDINGS Alignment: No significant spondylolisthesis. Skull base and vertebrae: The basion-dental and atlanto-dental intervals are maintained.No evidence of acute fracture to the cervical spine. Soft tissues and spinal canal: No prevertebral fluid or swelling. No visible canal hematoma. Disc levels: Cervical spondylosis with multilevel disc space narrowing and disc bulges/central disc protrusions. No appreciable high-grade spinal canal stenosis. No significant bony neural foraminal narrowing. Small ventral osteophyte at C4-C5. Upper chest: No consolidation within the imaged lung apices. No visible pneumothorax. Other: 4 mm nodule within the right thyroid lobe, not meeting consensus criteria for ultrasound follow-up based on size. No follow-up imaging is recommended. Reference: J Am Coll Radiol. 2015 Feb;12(2): 143-50. IMPRESSION: CT head: 1. No evidence of acute intracranial abnormality. 2. Minimal chronic small vessel ischemic changes within the cerebral white matter. 3. Mild generalized cerebral atrophy. 4. Anterior scalp/forehead hematoma (predominantly on the right). CT maxillofacial: 1. Acute, displaced fractures of the bilateral nasal bones and frontal process of right maxilla. Soft tissue swelling about the nose. 2. Right forehead and periorbital hematoma. 3. Small fluid level within the left maxillary sinus. CT cervical spine: 1. No evidence of acute fracture to the cervical spine. 2. Cervical spondylosis, as described. Electronically Signed   By: Kellie Simmering D.O.   On: 11/09/2021 15:18   CT CHEST ABDOMEN PELVIS WO CONTRAST  Result Date: 11/09/2021 CLINICAL DATA:  Fall with back and neck pain. EXAM: CT CHEST, ABDOMEN AND PELVIS WITHOUT  CONTRAST TECHNIQUE: Multidetector CT imaging of the chest, abdomen and pelvis was performed following the standard  protocol without IV contrast. RADIATION DOSE REDUCTION: This exam was performed according to the departmental dose-optimization program which includes automated exposure control, adjustment of the mA and/or kV according to patient size and/or use of iterative reconstruction technique. COMPARISON:  Same-day chest and pelvis radiographs, two-view chest radiograph 05/20/2019, CT abdomen/pelvis 02/22/2012 FINDINGS: CT CHEST FINDINGS Cardiovascular: The heart size is normal. There is no pericardial effusion. Mitral annular calcifications and mild aortic valve calcifications are noted. Minimal coronary artery calcifications are seen. There is minimal calcified plaque in the otherwise normal thoracic aorta. Mediastinum/Nodes: The thyroid is unremarkable. There is layering fluid in the esophagus. The esophagus is otherwise grossly unremarkable. There is no mediastinal or axillary lymphadenopathy. There is no bulky hilar adenopathy, within the confines of noncontrast technique. There is no mediastinal hematoma. Lungs/Pleura: The trachea and central airways are patent. Opacities in the dependent lower lobes most likely reflect subsegmental atelectasis. There are patchy ground-glass opacities in both upper lobes and more notably in the right middle lobe. There are no suspicious nodules. Musculoskeletal: There is no rib or sternal fracture. There is an acute compression fracture of the T12 vertebral body with bony retropulsion, assessed on the separately dictated thoracic spine CT. CT ABDOMEN PELVIS FINDINGS Hepatobiliary: The liver and gallbladder are unremarkable. There is no evidence of traumatic parenchymal injury. There is no biliary ductal dilatation. Pancreas: Unremarkable. No evidence of traumatic parenchymal injury. Spleen: Unremarkable.  No evidence of traumatic parenchymal injury. Adrenals/Urinary Tract: The adrenals are unremarkable. The kidneys are unremarkable, with no focal lesion (within the confines of  noncontrast technique), stone, hydronephrosis, hydroureter, or traumatic parenchymal injury. The bladder is unremarkable. Stomach/Bowel: The stomach is unremarkable. There is no evidence of bowel obstruction. There is no abnormal bowel wall thickening or inflammatory change. Vascular/Lymphatic: There is calcified plaque throughout the nonaneurysmal abdominal aorta. There is no abdominopelvic lymphadenopathy. Reproductive: Uterus and adnexa are unremarkable. Other: There is no ascites or free air. Musculoskeletal: There is no ascites or free air. No acute fracture or suspicious osseous lesion is seen. The lumbar spine is assessed in full on the separately dictated CT lumbar spine. IMPRESSION: 1. Acute compression fracture of the T12 vertebral body with mild bony retropulsion. Please also refer to the separately dictated CT of the thoracic and lumbar spine. 2. Otherwise, no evidence of acute traumatic injury in the chest, abdomen, or pelvis. 3. Patchy ground-glass opacities in the lungs most notably in the right middle lobe may reflect multifocal infection/inflammation or aspiration. 4. Layering fluid in the esophagus may reflect reflux. Electronically Signed   By: Valetta Mole M.D.   On: 11/09/2021 15:08   DG Pelvis Portable  Result Date: 11/09/2021 CLINICAL DATA:  Trauma, fall EXAM: PORTABLE PELVIS 1-2 VIEWS COMPARISON:  None Available. FINDINGS: No displaced fracture or dislocation is seen. Greater trochanter of proximal right femur is not included in its entirety. SI joints are symmetrical. Pubic symphysis is unremarkable. IMPRESSION: No recent fracture or dislocation is seen in the limited AP portable views of pelvis. Electronically Signed   By: Elmer Picker M.D.   On: 11/09/2021 13:57   DG Chest Port 1 View  Result Date: 11/09/2021 CLINICAL DATA:  Trauma, fall EXAM: PORTABLE CHEST 1 VIEW COMPARISON:  09/06/2021 FINDINGS: Transverse diameter of heart is slightly increased. Prominence of central  pulmonary vessels may be due to supine position. There are no signs of pulmonary edema or focal consolidation.  There is no significant pleural effusion or pneumothorax. IMPRESSION: No active disease. Electronically Signed   By: Elmer Picker M.D.   On: 11/09/2021 13:52    EKG: pending. HR on monitor in NSR with no obvious ST changes.    Labs on Admission: I have personally reviewed the available labs and imaging studies at the time of the admission.  Pertinent labs:   wbc: 15.8,  BUN: 25,  creatinine: 1.10,  Assessment and Plan: Principal Problem:   Acute respiratory failure with hypoxia (HCC) Active Problems:   Syncope, vasovagal   Leukocytosis   T12 compression fracture (HCC)   Essential hypertension   Nasal bone fractures   Controlled type 2 diabetes mellitus without complication, without long-term current use of insulin (HCC)   Chronic diastolic CHF (congestive heart failure) (HCC)   CKD (chronic kidney disease) stage 3, GFR 30-59 ml/min (HCC)   Mixed hyperlipidemia   GERD (gastroesophageal reflux disease)   Mild recurrent major depression (HCC)   OSA (obstructive sleep apnea)   Allergic asthma    Assessment and Plan: * Acute respiratory failure with hypoxia ([place]) 76 year old presenting after a syncopal event with fall found to have acute respiratory failure with hypoxia down to 77% on RA and now having a new oxygen requirement of 2L to maintain oxygenation -admit to telemetry -concern for aspiration in setting of syncopal event, emesis and new oxygen requirement  -check PCT and urinary antigens -continue rocephin/azithromycin for now as will cover for aspiration with rocephin -vbg reassuring -no s/s of asthma exacerbation or CHF exacerbation  -wean as tolerated    Syncope, vasovagal History of multiple episodes of vasovagal syncope. Saw cardiology once in the past and had a 5 day monitor -CT head with no acute finding -troponin levels pending, ekg not done and  have asked nurse x 2, but monitor shows NSR with no acute changes. Continue telemetry -check orthostatics -hold lasix and hctz  -echo  -TED hose  -given 500cc bolus in ED, hold other fluids for now   Leukocytosis Reactive vs. Possible pneumonia Continue. Received 500cc IVF in ED Trend and follow fever curve   T12 compression fracture (HCC) EDP discussed with neurosurgery who recommended TLSO and outpatient follow up Tylenol/tramadol prn for pain Morphine PRN for severe pain She is only wanting to use tylenol  PT to eval   Nasal bone fractures Displaced fractures of nasal bone with some soft tissue swelling about the bone She does not complain of any issues breathing through her nose ENT consult for possible reduction since know time of onset. Allow swelling to subside overnight Ice/pain medication   Essential hypertension Well controlled Continue toprol xl  Hold home hctz-benicar and lasix with syncope -hyponatremia likely secondary to hctz   Controlled type 2 diabetes mellitus without complication, without long-term current use of insulin (HCC) Recent A1C of 6.5 Continue diet and lifestyle modifications SSI and accuchecks QAC/HS while in patient   CKD (chronic kidney disease) stage 3, GFR 30-59 ml/min (HCC) Baseline of 1.1-1.2, stable   Chronic diastolic CHF (congestive heart failure) (HCC) Appears dry to euvolemic.  BNP wnl  Last echo in 2021: normal EF with grade 2 DD. No AS Strict I/O Hold lasix, continue toprol Holding arb-hctz for tomorrow (already took today)   Mixed hyperlipidemia Continue crestor   GERD (gastroesophageal reflux disease) Continue PPI   Mild recurrent major depression (HCC) Continue celexa and elavil   OSA (obstructive sleep apnea) Continue cpap at night   Allergic asthma  Continue trelegy and albuterol PRN  Continue singulair     Advance Care Planning:   Code Status: Full Code   Consults: neurosurgery: Dr. Christella Noa   DVT  Prophylaxis: lovenox   Family Communication: son at bedside   Severity of Illness: The appropriate patient status for this patient is INPATIENT. Inpatient status is judged to be reasonable and necessary in order to provide the required intensity of service to ensure the patient's safety. The patient's presenting symptoms, physical exam findings, and initial radiographic and laboratory data in the context of their chronic comorbidities is felt to place them at high risk for further clinical deterioration. Furthermore, it is not anticipated that the patient will be medically stable for discharge from the hospital within 2 midnights of admission.   * I certify that at the point of admission it is my clinical judgment that the patient will require inpatient hospital care spanning beyond 2 midnights from the point of admission due to high intensity of service, high risk for further deterioration and high frequency of surveillance required.*  Author: Orma Flaming, MD 11/09/2021 7:28 PM  For on call review [email or web address].

## 2021-11-09 NOTE — Assessment & Plan Note (Addendum)
Well controlled Continue toprol xl  Hold home hctz-benicar and lasix with syncope -hyponatremia likely secondary to hctz

## 2021-11-09 NOTE — Assessment & Plan Note (Addendum)
Continue trelegy and albuterol PRN  Continue singulair

## 2021-11-09 NOTE — Assessment & Plan Note (Addendum)
Displaced fractures of nasal bone with some soft tissue swelling about the bone She does not complain of any issues breathing through her nose ENT consult for possible reduction since know time of onset. Allow swelling to subside overnight Ice/pain medication

## 2021-11-09 NOTE — Assessment & Plan Note (Signed)
Recent A1C of 6.5 Continue diet and lifestyle modifications SSI and accuchecks QAC/HS while in patient

## 2021-11-09 NOTE — Assessment & Plan Note (Signed)
History of multiple episodes of vasovagal syncope. Saw cardiology once in the past and had a 5 day monitor -CT head with no acute finding -troponin levels pending, ekg not done and have asked nurse x 2, but monitor shows NSR with no acute changes. Continue telemetry -check orthostatics -hold lasix and hctz  -echo  -TED hose  -given 500cc bolus in ED, hold other fluids for now

## 2021-11-09 NOTE — Assessment & Plan Note (Signed)
Continue PPI ?

## 2021-11-10 ENCOUNTER — Other Ambulatory Visit (HOSPITAL_COMMUNITY): Payer: Medicare Other

## 2021-11-10 ENCOUNTER — Other Ambulatory Visit: Payer: Self-pay

## 2021-11-10 ENCOUNTER — Inpatient Hospital Stay (HOSPITAL_COMMUNITY): Payer: Medicare Other

## 2021-11-10 DIAGNOSIS — S022XXA Fracture of nasal bones, initial encounter for closed fracture: Secondary | ICD-10-CM

## 2021-11-10 DIAGNOSIS — R55 Syncope and collapse: Secondary | ICD-10-CM | POA: Diagnosis not present

## 2021-11-10 DIAGNOSIS — J9601 Acute respiratory failure with hypoxia: Secondary | ICD-10-CM | POA: Diagnosis not present

## 2021-11-10 DIAGNOSIS — S22080A Wedge compression fracture of T11-T12 vertebra, initial encounter for closed fracture: Secondary | ICD-10-CM

## 2021-11-10 LAB — BASIC METABOLIC PANEL
Anion gap: 9 (ref 5–15)
Anion gap: 10 (ref 5–15)
BUN: 13 mg/dL (ref 8–23)
BUN: 10 mg/dL (ref 8–23)
CO2: 24 mmol/L (ref 22–32)
CO2: 25 mmol/L (ref 22–32)
Calcium: 8.7 mg/dL — ABNORMAL LOW (ref 8.9–10.3)
Calcium: 9 mg/dL (ref 8.9–10.3)
Chloride: 97 mmol/L — ABNORMAL LOW (ref 98–111)
Chloride: 98 mmol/L (ref 98–111)
Creatinine, Ser: 1.23 mg/dL — ABNORMAL HIGH (ref 0.44–1.00)
Creatinine, Ser: 1.12 mg/dL — ABNORMAL HIGH (ref 0.44–1.00)
GFR, Estimated: 46 mL/min — ABNORMAL LOW (ref >60)
GFR, Estimated: 51 mL/min — ABNORMAL LOW (ref >60)
Glucose, Bld: 109 mg/dL — ABNORMAL HIGH (ref 70–99)
Glucose, Bld: 100 mg/dL — ABNORMAL HIGH (ref 70–99)
Potassium: 3.1 mmol/L — ABNORMAL LOW (ref 3.5–5.1)
Potassium: 3.9 mmol/L (ref 3.5–5.1)
Sodium: 130 mmol/L — ABNORMAL LOW (ref 135–145)
Sodium: 133 mmol/L — ABNORMAL LOW (ref 135–145)

## 2021-11-10 LAB — CBC
HCT: 33.3 % — ABNORMAL LOW (ref 36.0–46.0)
Hemoglobin: 11.3 g/dL — ABNORMAL LOW (ref 12.0–15.0)
MCH: 30 pg (ref 26.0–34.0)
MCHC: 33.9 g/dL (ref 30.0–36.0)
MCV: 88.3 fL (ref 80.0–100.0)
Platelets: 223 10*3/uL (ref 150–400)
RBC: 3.77 MIL/uL — ABNORMAL LOW (ref 3.87–5.11)
RDW: 14 % (ref 11.5–15.5)
WBC: 15.4 10*3/uL — ABNORMAL HIGH (ref 4.0–10.5)
nRBC: 0 % (ref 0.0–0.2)

## 2021-11-10 LAB — COMPREHENSIVE METABOLIC PANEL
ALT: 20 U/L (ref 0–44)
AST: 28 U/L (ref 15–41)
Albumin: 3.3 g/dL — ABNORMAL LOW (ref 3.5–5.0)
Alkaline Phosphatase: 60 U/L (ref 38–126)
Anion gap: 10 (ref 5–15)
BUN: 14 mg/dL (ref 8–23)
CO2: 23 mmol/L (ref 22–32)
Calcium: 8.7 mg/dL — ABNORMAL LOW (ref 8.9–10.3)
Chloride: 97 mmol/L — ABNORMAL LOW (ref 98–111)
Creatinine, Ser: 1.26 mg/dL — ABNORMAL HIGH (ref 0.44–1.00)
GFR, Estimated: 45 mL/min — ABNORMAL LOW (ref >60)
Glucose, Bld: 104 mg/dL — ABNORMAL HIGH (ref 70–99)
Potassium: 3.3 mmol/L — ABNORMAL LOW (ref 3.5–5.1)
Sodium: 130 mmol/L — ABNORMAL LOW (ref 135–145)
Total Bilirubin: 1 mg/dL (ref 0.3–1.2)
Total Protein: 6 g/dL — ABNORMAL LOW (ref 6.5–8.1)

## 2021-11-10 LAB — URINALYSIS, ROUTINE W REFLEX MICROSCOPIC
Bacteria, UA: NONE SEEN
Bilirubin Urine: NEGATIVE
Glucose, UA: NEGATIVE mg/dL
Ketones, ur: 5 mg/dL — AB
Nitrite: NEGATIVE
Protein, ur: 30 mg/dL — AB
Specific Gravity, Urine: 1.013 (ref 1.005–1.030)
pH: 5 (ref 5.0–8.0)

## 2021-11-10 LAB — GLUCOSE, CAPILLARY
Glucose-Capillary: 108 mg/dL — ABNORMAL HIGH (ref 70–99)
Glucose-Capillary: 167 mg/dL — ABNORMAL HIGH (ref 70–99)

## 2021-11-10 LAB — ECHOCARDIOGRAM COMPLETE
Area-P 1/2: 3.37 cm2
Height: 62 in
S' Lateral: 2.9 cm
Weight: 3248 oz

## 2021-11-10 LAB — CBG MONITORING, ED
Glucose-Capillary: 116 mg/dL — ABNORMAL HIGH (ref 70–99)
Glucose-Capillary: 107 mg/dL — ABNORMAL HIGH (ref 70–99)

## 2021-11-10 LAB — PROCALCITONIN: Procalcitonin: 0.15 ng/mL

## 2021-11-10 LAB — CK: Total CK: 304 U/L — ABNORMAL HIGH (ref 38–234)

## 2021-11-10 LAB — STREP PNEUMONIAE URINARY ANTIGEN: Strep Pneumo Urinary Antigen: NEGATIVE

## 2021-11-10 LAB — MICROALBUMIN / CREATININE URINE RATIO
Creatinine, Urine: 21.7 mg/dL
Microalb/Creat Ratio: 72 mg/g creat — ABNORMAL HIGH (ref 0–29)
Microalbumin, Urine: 15.6 ug/mL

## 2021-11-10 MED ORDER — POTASSIUM CHLORIDE CRYS ER 20 MEQ PO TBCR
40.0000 meq | EXTENDED_RELEASE_TABLET | Freq: Once | ORAL | Status: AC
  Administered 2021-11-10: 40 meq via ORAL
  Filled 2021-11-10: qty 2

## 2021-11-10 MED ORDER — SALINE SPRAY 0.65 % NA SOLN
2.0000 | NASAL | Status: DC | PRN
  Administered 2021-11-10 – 2021-11-11 (×2): 2 via NASAL
  Filled 2021-11-10 (×2): qty 44

## 2021-11-10 MED ORDER — POLYETHYLENE GLYCOL 3350 17 G PO PACK
17.0000 g | PACK | Freq: Two times a day (BID) | ORAL | Status: DC
  Administered 2021-11-10 – 2021-11-14 (×8): 17 g via ORAL
  Filled 2021-11-10 (×8): qty 1

## 2021-11-10 NOTE — Telephone Encounter (Signed)
Diana Hendrix called to report that she was in the bathroom after taking the amitiza and she fell striking her back, nose and cheekbone.  She currently is in the ED waiting for admission.  Dr. Tobie Poet was made aware.

## 2021-11-10 NOTE — Progress Notes (Signed)
Echo attempted at 2:20, patient moving upstairs. Will reattempt as schedule permits.  Diana Hendrix

## 2021-11-10 NOTE — Progress Notes (Signed)
  Echocardiogram 2D Echocardiogram has been performed.  Diana Hendrix 11/10/2021, 4:58 PM

## 2021-11-10 NOTE — Progress Notes (Signed)
Pt refused CPAP qhs at this time due to facial trauma.  Pt will agreeable to sleep with the head of her bed elevated and wearing her nasal cannula.

## 2021-11-10 NOTE — Progress Notes (Addendum)
Pt arrived to [facility] room 03 via bed from ED at 1450. Pt A&O x4, on 4L [place], and has IV x1. Pt and husband oriented to unit and room. Pt placed on tele and continuous pulse ox. VSS. CHG bath performed. Call bell given to pt.

## 2021-11-10 NOTE — Plan of Care (Signed)
Pt arrived to unit today. Pt has bilateral bruising around eyes and redness in genital region. Pt requested miralax to be ordered BID due to feeling constipated. Orthostatic VS ordered but are unable to be performed due to pt having severe back pain, nausea, and dizziness with movement.  Problem: Education: Goal: Ability to describe self-care measures that may prevent or decrease complications (Diabetes Survival Skills Education) will improve Outcome: Progressing Goal: Individualized Educational Video(s) Outcome: Progressing   Problem: Coping: Goal: Ability to adjust to condition or change in health will improve Outcome: Progressing   Problem: Fluid Volume: Goal: Ability to maintain a balanced intake and output will improve Outcome: Progressing   Problem: Health Behavior/Discharge Planning: Goal: Ability to identify and utilize available resources and services will improve Outcome: Progressing Goal: Ability to manage health-related needs will improve Outcome: Progressing   Problem: Metabolic: Goal: Ability to maintain appropriate glucose levels will improve Outcome: Progressing   Problem: Nutritional: Goal: Maintenance of adequate nutrition will improve Outcome: Progressing Goal: Progress toward achieving an optimal weight will improve Outcome: Progressing   Problem: Skin Integrity: Goal: Risk for impaired skin integrity will decrease Outcome: Progressing   Problem: Tissue Perfusion: Goal: Adequacy of tissue perfusion will improve Outcome: Progressing   Problem: Education: Goal: Knowledge of General Education information will improve Description: Including pain rating scale, medication(s)/side effects and non-pharmacologic comfort measures Outcome: Progressing   Problem: Health Behavior/Discharge Planning: Goal: Ability to manage health-related needs will improve Outcome: Progressing   Problem: Clinical Measurements: Goal: Ability to maintain clinical measurements  within normal limits will improve Outcome: Progressing Goal: Will remain free from infection Outcome: Progressing Goal: Diagnostic test results will improve Outcome: Progressing Goal: Respiratory complications will improve Outcome: Progressing Goal: Cardiovascular complication will be avoided Outcome: Progressing   Problem: Activity: Goal: Risk for activity intolerance will decrease Outcome: Progressing   Problem: Nutrition: Goal: Adequate nutrition will be maintained Outcome: Progressing   Problem: Coping: Goal: Level of anxiety will decrease Outcome: Progressing   Problem: Elimination: Goal: Will not experience complications related to bowel motility Outcome: Progressing Goal: Will not experience complications related to urinary retention Outcome: Progressing   Problem: Pain Managment: Goal: General experience of comfort will improve Outcome: Progressing   Problem: Safety: Goal: Ability to remain free from injury will improve Outcome: Progressing   Problem: Skin Integrity: Goal: Risk for impaired skin integrity will decrease Outcome: Progressing

## 2021-11-10 NOTE — Progress Notes (Signed)
Physical Therapy Evaluation Patient Details Name: Diana Hendrix MRN: [id number] DOB: Sep 06, 1945 Today's Date: 11/10/2021  History of Present Illness  76 yo female with syncopal episode on toilet at home fell forward, fractured her nasal bone, R maxillary bone and has now T12 compression fracture.  Episode considered vasovagal, orthostatics are being checked.  Pt has also acute resp failure with 77% O2 sat on room air upon arrival.  Placed on 2L O2, monitor as aspiration suspected.  PMHx:  DM, OSA, HTN, HLD, CHF, GERD, vasovagal syncope  Clinical Impression  Pt was seen for brief eval, unable to get past EOB due to sudden onset of dizzy and nauseated feelings.  Pt is orthostatic, but HR in low 90's and sats were close to or at 100%.  Pt is nearly total assist to get back to bed and up, with poor tolerances and her pain with injuries.  Follow acutely for mobility on LRAD and to try to get brace on and standing with BP ck needed.  Follow up with rehab needs with SNF plan for now due to her magnitude of assist needed, pain level and her use of TLSO needed.  Progress gait as tolerated.       Recommendations for follow up therapy are one component of a multi-disciplinary discharge planning process, led by the attending physician.  Recommendations may be updated based on patient status, additional functional criteria and insurance authorization.  Follow Up Recommendations Skilled nursing-short term rehab (<3 hours/day) Can patient physically be transported by private vehicle: No    Assistance Recommended at Discharge Frequent or constant Supervision/Assistance  Patient can return home with the following  Two people to help with walking and/or transfers;Two people to help with bathing/dressing/bathroom;Assistance with cooking/housework;Assist for transportation;Help with stairs or ramp for entrance    Equipment Recommendations None recommended by PT  Recommendations for Other Services        Functional Status Assessment Patient has had a recent decline in their functional status and demonstrates the ability to make significant improvements in function in a reasonable and predictable amount of time.     Precautions / Restrictions Precautions Precautions: Fall;Back Precaution Booklet Issued: No Precaution Comments: pt was given verbal precautions Required Braces or Orthoses: Spinal Brace Spinal Brace: Thoracolumbosacral orthotic Restrictions Weight Bearing Restrictions: No  Monitor BP during standing     Mobility  Bed Mobility Overal bed mobility: Needs Assistance Bed Mobility: Sidelying to Sit, Rolling, Sit to Sidelying Rolling: Mod assist Sidelying to sit: Total assist     Sit to sidelying: Total assist      Transfers                   General transfer comment: unable to tolerate    Ambulation/Gait               General Gait Details: unable to tolerate  Stairs            Wheelchair Mobility    Modified Rankin (Stroke Patients Only)       Balance Overall balance assessment: Needs assistance Sitting-balance support: Bilateral upper extremity supported Sitting balance-Leahy Scale: Poor Sitting balance - Comments: continual support as pt is suddenly dizzy and in too much pain to stay up                                     Pertinent Vitals/Pain Pain Assessment Pain Assessment: Faces Faces  Pain Scale: Hurts whole lot Pain Location: back with any movement Pain Descriptors / Indicators: Aching, Grimacing Pain Intervention(s): Monitored during session, Limited activity within patient's tolerance, Premedicated before session, Repositioned    Home Living Family/patient expects to be discharged to:: Private residence Living Arrangements: Spouse/significant other                 Additional Comments: home with family but requires two person help to manage adequately    Prior Function Prior Level of Function :  Independent/Modified Independent             Mobility Comments: has equpment to walk but was on rollator and still drives       Hand Dominance   Dominant Hand: Right    Extremity/Trunk Assessment   Upper Extremity Assessment Upper Extremity Assessment: Overall WFL for tasks assessed    Lower Extremity Assessment Lower Extremity Assessment: Generalized weakness (hindered by pain as well)    Cervical / Trunk Assessment Cervical / Trunk Assessment: Other exceptions (spinal fracture T12 compression)  Communication   Communication: No difficulties  Cognition Arousal/Alertness: Awake/alert Behavior During Therapy: Anxious Overall Cognitive Status: No family/caregiver present to determine baseline cognitive functioning                                 General Comments: pt is very distracted by her pain and cannot fully determine her cognition        General Comments General comments (skin integrity, edema, etc.): BP supine was 95/59 and the measure from sitting to sudden need to get supine was 93/78    Exercises     Assessment/Plan    PT Assessment Patient needs continued PT services  PT Problem List Decreased range of motion;Decreased strength;Decreased activity tolerance;Decreased balance;Decreased mobility;Decreased coordination;Decreased knowledge of use of DME;Cardiopulmonary status limiting activity;Decreased skin integrity;Pain       PT Treatment Interventions DME instruction;Gait training;Functional mobility training;Therapeutic activities;Therapeutic exercise;Balance training;Neuromuscular re-education;Patient/family education    PT Goals (Current goals can be found in the Care Plan section)  Acute Rehab PT Goals Patient Stated Goal: to go home and manage pain better PT Goal Formulation: With patient Time For Goal Achievement: 11/24/21 Potential to Achieve Goals: Good    Frequency Min 4X/week     Co-evaluation                AM-PAC PT "6 Clicks" Mobility  Outcome Measure Help needed turning from your back to your side while in a flat bed without using bedrails?: A Lot Help needed moving from lying on your back to sitting on the side of a flat bed without using bedrails?: A Lot Help needed moving to and from a bed to a chair (including a wheelchair)?: Total Help needed standing up from a chair using your arms (e.g., wheelchair or bedside chair)?: Total Help needed to walk in hospital room?: Total Help needed climbing 3-5 steps with a railing? : Total 6 Click Score: 8    End of Session Equipment Utilized During Treatment: Oxygen Activity Tolerance: Patient limited by pain;Patient limited by fatigue;Treatment limited secondary to medical complications (Comment) Patient left: in bed;with call bell/phone within reach Nurse Communication: Mobility status PT Visit Diagnosis: Muscle weakness (generalized) (M62.81);Pain;Difficulty in walking, not elsewhere classified (R26.2);History of falling (Z91.81) Pain - Right/Left:  (back and nose) Pain - part of body:  (back and nose)    Time: 3267-1245 PT Time Calculation (min) (ACUTE ONLY):  13 min   Charges:   PT Evaluation $PT Eval Moderate Complexity: 1 Mod         Ramond Dial 11/10/2021, 4:08 PM  Mee Hives, PT PhD Acute Rehab Dept. Number: [place] [facility] [place]

## 2021-11-10 NOTE — Progress Notes (Signed)
[facility]  PROGRESS NOTE  Diana Hendrix YFV:[id number] DOB: 09-09-1945 DOA: 11/09/2021 PCP: Rochel Brome, MD   Brief HPI:   76 year old female with history of OSA, hypertension, diabetes mellitus type 2, hyperlipidemia, diastolic CHF, GERD, vasovagal syncope came to ED with syncopal episode and fall at home. Patient was constipated had a bowel movement . After she had BM she felt dizzy and lightheaded.  She has history of vasovagal syncope.  She sat on toilet for a while but ended up passing out.  She like to be fell forward off toilet and fell on her face.  She also vomited while she passed out. Patient denies any chest pain or palpitations.  Patient was admitted with vasovagal syncope, CT head showed no acute finding.  Echocardiogram ordered.    Subjective   Patient seen and examined, denies any complaints.   Assessment/Plan:     Vasovagal syncope -Patient has had multiple episodes of vasovagal syncope -Has seen cardiology in the past -CT head showed no acute findings -Echocardiogram ordered currently pending -Lasix and HCTZ are on hold -Follow orthostatic vital signs  Leukocytosis -Likely reactive -Also started empirically on ceftriaxone and Zithromax for possible pneumonia  T12 compression fracture -ED provider discussed with neurosurgery who recommended TLSO brace and outpatient follow-up -Continue Tylenol/tramadol as needed  Nasal bone fracture -Displaced fracture of nasal bone and soft tissue swelling -Consulted oral surgeon Dr. Mancel Parsons will see patient as outpatient.  Hypertension -Blood pressure is stable -Continue Toprol XL -HCTZ and Lasix on hold due to syncope  Diabetes mellitus type 2 -Continue sliding scale insulin with NovoLog  CKD stage III -Baseline creatinine 1.1-1.2 -Stable  Chronic diastolic CHF -Near euvolemic -Last echo from 2021 showed normal EF with grade 2 diastolic dysfunction -Lasix on hold -Metoprolol  XL  Hypokalemia -Potassium is 3.3 -Replace potassium and follow BMP in am  Mixed hyperlipidemia Continue crestor    GERD (gastroesophageal reflux disease) Continue PPI    Mild recurrent major depression (HCC) Continue celexa and elavil    OSA (obstructive sleep apnea) Continue cpap at night    Allergic asthma Continue trelegy and albuterol PRN  Continue singulair    Medications     amitriptyline  25 mg Oral QHS   citalopram  10 mg Oral Daily   enoxaparin (LOVENOX) injection  40 mg Subcutaneous Q24H   fluticasone furoate-vilanterol  1 puff Inhalation Daily   And   umeclidinium bromide  1 puff Inhalation Daily   insulin aspart  0-9 Units Subcutaneous TID WC   lidocaine  1 patch Transdermal Q24H   metoprolol succinate  25 mg Oral Daily   montelukast  10 mg Oral QHS   pantoprazole  80 mg Oral Daily   polyethylene glycol  17 g Oral QHS   rosuvastatin  40 mg Oral QHS   sodium chloride flush  3 mL Intravenous Q12H     Data Reviewed:   CBG:  Recent Labs  Lab 11/10/21 0756  GLUCAP 116*    SpO2: 95 % O2 Flow Rate (L/min): 2 L/min    Vitals:   11/10/21 0300 11/10/21 0600 11/10/21 0702 11/10/21 0704  BP: 106/62 103/61 (!) 95/55   Pulse: 79 78 74   Resp: (!) 21 (!) 21 18   Temp:    98.2 F (36.8 C)  TempSrc:    Oral  SpO2: 95% (!) 89% 95%   Weight:      Height:          Data Reviewed:  Basic  Metabolic Panel: Recent Labs  Lab 11/08/21 1416 11/09/21 1337 11/10/21 0635 11/10/21 0645  NA 137 132* 130* 130*  K 4.2 4.2 3.1* 3.3*  CL 96 101 97* 97*  CO2 24  --  24 23  GLUCOSE 83 130* 109* 104*  BUN 20 25* 13 14  CREATININE 1.34* 1.10* 1.23* 1.26*  CALCIUM 9.9  --  8.7* 8.7*    CBC: Recent Labs  Lab 11/09/21 1330 11/09/21 1337 11/10/21 0635  WBC 15.8*  --  15.4*  HGB 12.6 13.3 11.3*  HCT 38.5 39.0 33.3*  MCV 89.1  --  88.3  PLT 253  --  223    LFT Recent Labs  Lab 11/08/21 1416 11/10/21 0645  AST 27 28  ALT 20 20  ALKPHOS 85 60   BILITOT 0.4 1.0  PROT 7.5 6.0*  ALBUMIN 4.7 3.3*     Antibiotics: Anti-infectives (From admission, onward)    Start     Dose/Rate Route Frequency Ordered Stop   11/10/21 1700  cefTRIAXone (ROCEPHIN) 1 g in sodium chloride 0.9 % 100 mL IVPB        1 g 200 mL/hr over 30 Minutes Intravenous Every 24 hours 11/09/21 1927 11/15/21 1659   11/09/21 2000  azithromycin (ZITHROMAX) 500 mg in sodium chloride 0.9 % 250 mL IVPB        500 mg 250 mL/hr over 60 Minutes Intravenous Every 24 hours 11/09/21 1927 11/14/21 1959   11/09/21 1630  cefTRIAXone (ROCEPHIN) 1 g in sodium chloride 0.9 % 100 mL IVPB        1 g 200 mL/hr over 30 Minutes Intravenous  Once 11/09/21 1618 11/09/21 1933   11/09/21 1630  doxycycline (VIBRA-TABS) tablet 100 mg  Status:  Discontinued        100 mg Oral  Once 11/09/21 1618 11/09/21 1620   11/09/21 1630  azithromycin (ZITHROMAX) 500 mg in sodium chloride 0.9 % 250 mL IVPB  Status:  Discontinued        500 mg 250 mL/hr over 60 Minutes Intravenous  Once 11/09/21 1620 11/09/21 1927        DVT prophylaxis: Lovenox  Code Status: Full code  Family Communication:    CONSULTS    Objective    Physical Examination:   General-appears in no acute distress Heart-S1-S2, regular, no murmur auscultated Lungs-clear to auscultation bilaterally, no wheezing or crackles auscultated Abdomen-soft, nontender, no organomegaly Extremities-no edema in the lower extremities Neuro-alert, oriented x3, no focal deficit noted  Status is: Inpatient:       [place]   [facility] If 7PM-7AM, please contact night-coverage at [email or web address], Office  [phone]   11/10/2021, 8:09 AM  LOS: 1 day

## 2021-11-11 ENCOUNTER — Encounter: Payer: Self-pay | Admitting: Family Medicine

## 2021-11-11 DIAGNOSIS — I5032 Chronic diastolic (congestive) heart failure: Secondary | ICD-10-CM | POA: Diagnosis not present

## 2021-11-11 DIAGNOSIS — S22080A Wedge compression fracture of T11-T12 vertebra, initial encounter for closed fracture: Secondary | ICD-10-CM | POA: Diagnosis not present

## 2021-11-11 DIAGNOSIS — J9601 Acute respiratory failure with hypoxia: Secondary | ICD-10-CM | POA: Diagnosis not present

## 2021-11-11 DIAGNOSIS — S022XXA Fracture of nasal bones, initial encounter for closed fracture: Secondary | ICD-10-CM | POA: Diagnosis not present

## 2021-11-11 DIAGNOSIS — E119 Type 2 diabetes mellitus without complications: Secondary | ICD-10-CM

## 2021-11-11 DIAGNOSIS — I10 Essential (primary) hypertension: Secondary | ICD-10-CM

## 2021-11-11 LAB — CBC
HCT: 34.6 % — ABNORMAL LOW (ref 36.0–46.0)
Hemoglobin: 11.8 g/dL — ABNORMAL LOW (ref 12.0–15.0)
MCH: 30.1 pg (ref 26.0–34.0)
MCHC: 34.1 g/dL (ref 30.0–36.0)
MCV: 88.3 fL (ref 80.0–100.0)
Platelets: 222 10*3/uL (ref 150–400)
RBC: 3.92 MIL/uL (ref 3.87–5.11)
RDW: 14.3 % (ref 11.5–15.5)
WBC: 11 10*3/uL — ABNORMAL HIGH (ref 4.0–10.5)
nRBC: 0 % (ref 0.0–0.2)

## 2021-11-11 LAB — GLUCOSE, CAPILLARY
Glucose-Capillary: 131 mg/dL — ABNORMAL HIGH (ref 70–99)
Glucose-Capillary: 119 mg/dL — ABNORMAL HIGH (ref 70–99)
Glucose-Capillary: 98 mg/dL (ref 70–99)
Glucose-Capillary: 113 mg/dL — ABNORMAL HIGH (ref 70–99)

## 2021-11-11 LAB — PROCALCITONIN: Procalcitonin: <0.1 ng/mL

## 2021-11-11 MED ORDER — FUROSEMIDE 20 MG PO TABS
20.0000 mg | ORAL_TABLET | Freq: Every day | ORAL | Status: DC
  Administered 2021-11-11 – 2021-11-12 (×2): 20 mg via ORAL
  Filled 2021-11-11 (×2): qty 1

## 2021-11-11 MED ORDER — MILK AND MOLASSES ENEMA
1.0000 | Freq: Once | RECTAL | Status: AC
  Administered 2021-11-11: 240 mL via RECTAL
  Filled 2021-11-11 (×2): qty 240

## 2021-11-11 MED ORDER — AZITHROMYCIN 250 MG PO TABS: 500.0000 mg | ORAL_TABLET | Freq: Every day | ORAL | Status: DC

## 2021-11-11 MED ORDER — BISACODYL 10 MG RE SUPP
10.0000 mg | Freq: Once | RECTAL | Status: AC
  Administered 2021-11-11: 10 mg via RECTAL
  Filled 2021-11-11: qty 1

## 2021-11-11 NOTE — Plan of Care (Signed)
Pt given suppository this AM with no results; milk and molasses enema given this evening; pt verbalized relief from this. Pt weaned to 1L [place]; pt 86% on RA. Pt got a new IV placed in left forearm.  Problem: Coping: Goal: Ability to adjust to condition or change in health will improve Outcome: Progressing   Problem: Fluid Volume: Goal: Ability to maintain a balanced intake and output will improve Outcome: Progressing   Problem: Health Behavior/Discharge Planning: Goal: Ability to identify and utilize available resources and services will improve Outcome: Progressing Goal: Ability to manage health-related needs will improve Outcome: Progressing   Problem: Metabolic: Goal: Ability to maintain appropriate glucose levels will improve Outcome: Progressing   Problem: Nutritional: Goal: Maintenance of adequate nutrition will improve Outcome: Progressing Goal: Progress toward achieving an optimal weight will improve Outcome: Progressing   Problem: Skin Integrity: Goal: Risk for impaired skin integrity will decrease Outcome: Progressing   Problem: Tissue Perfusion: Goal: Adequacy of tissue perfusion will improve Outcome: Progressing   Problem: Education: Goal: Knowledge of General Education information will improve Description: Including pain rating scale, medication(s)/side effects and non-pharmacologic comfort measures Outcome: Progressing   Problem: Health Behavior/Discharge Planning: Goal: Ability to manage health-related needs will improve Outcome: Progressing   Problem: Clinical Measurements: Goal: Ability to maintain clinical measurements within normal limits will improve Outcome: Progressing Goal: Will remain free from infection Outcome: Progressing Goal: Diagnostic test results will improve Outcome: Progressing Goal: Respiratory complications will improve Outcome: Progressing Goal: Cardiovascular complication will be avoided Outcome: Progressing   Problem:  Activity: Goal: Risk for activity intolerance will decrease Outcome: Progressing   Problem: Coping: Goal: Level of anxiety will decrease Outcome: Progressing   Problem: Elimination: Goal: Will not experience complications related to bowel motility Outcome: Progressing Goal: Will not experience complications related to urinary retention Outcome: Progressing   Problem: Pain Managment: Goal: General experience of comfort will improve Outcome: Progressing   Problem: Safety: Goal: Ability to remain free from injury will improve Outcome: Progressing   Problem: Skin Integrity: Goal: Risk for impaired skin integrity will decrease Outcome: Progressing

## 2021-11-11 NOTE — NC FL2 (Signed)
[place] LEVEL OF CARE SCREENING TOOL     IDENTIFICATION  Patient Name: Diana Hendrix Birthdate: November 14, 1945 Sex: female Admission Date (Current Location): 11/09/2021  [facility] and [place] Number:  Herbalist and Address:  [facility] [place], [place] [address], [place], [place] [address]      Provider Number: [id number]  Attending Physician Name and Address:  Oswald Hillock, MD  Relative Name and Phone Number:  [name], [name] (Spouse)   [phone] [facility])    Current Level of Care: Hospital Recommended Level of Care: [place] Prior Approval Number:    Date Approved/Denied:   PASRR Number: [id number]  Discharge Plan: SNF    Current Diagnoses: Patient Active Problem List   Diagnosis Date Noted   Acute respiratory failure with hypoxia ([place]) 11/09/2021   Controlled type 2 diabetes mellitus without complication, without long-term current use of insulin ([place]) 11/09/2021   Essential hypertension 11/09/2021   Chronic diastolic CHF (congestive heart failure) ([place]) 11/09/2021   CKD (chronic kidney disease) stage 3, GFR 30-59 ml/min ([place]) 11/09/2021   Leukocytosis 11/09/2021   Allergic asthma 11/09/2021   T12 compression fracture ([place]) 11/09/2021   Nasal bone fractures 11/09/2021   Mild recurrent major depression ([place]) 01/30/2021   Bulge of cervical disc without myelopathy 08/19/2020   Macular edema, cystoid    History of colon polyps    Biceps tendonitis on right 01/23/2020   Osteopenia 01/12/2020   Positive ANA (antinuclear antibody) 12/10/2019   Elevated sedimentation rate 12/10/2019   Hypertensive heart disease with chronic diastolic congestive heart failure ([place]) 08/14/2019   Migraine with visual aura 10/16/2018   Chronic idiopathic constipation 10/16/2018   Syncope, vasovagal 04/16/2017   Impaired fasting blood sugar 10/11/2016   S/P right knee arthroscopy 09/21/2016   Mixed hyperlipidemia 09/17/2013   OSA  (obstructive sleep apnea) 09/17/2012   GERD (gastroesophageal reflux disease) 09/17/2012    Orientation RESPIRATION BLADDER Height & Weight     Self, Time, Place, Situation  O2 (1L [place]) External catheter Weight: 206 lb 9.1 oz (93.7 kg) Height:  '5\' 2"'$  (157.5 cm)  BEHAVIORAL SYMPTOMS/MOOD NEUROLOGICAL BOWEL NUTRITION STATUS      Continent Diet (see d/c summary)  AMBULATORY STATUS COMMUNICATION OF NEEDS Skin   Extensive Assist Verbally Normal                       Personal Care Assistance Level of Assistance  Bathing, Feeding, Dressing Bathing Assistance: Maximum assistance Feeding assistance: Independent Dressing Assistance: Maximum assistance     Functional Limitations Info  Sight, Hearing, Speech Sight Info: Impaired Hearing Info: Adequate Speech Info: Adequate    SPECIAL CARE FACTORS FREQUENCY  OT (By licensed OT), PT (By licensed PT)     PT Frequency: 5x/week OT Frequency: 5x/week            Contractures Contractures Info: Not present    Additional Factors Info  Code Status, Allergies Code Status Info: Full code Allergies Info: Zyrtec (cetirizine)           Current Medications (11/11/2021):  This is the current hospital active medication list Current Facility-Administered Medications  Medication Dose Route Frequency Provider Last Rate Last Admin   0.9 %  sodium chloride infusion  250 mL Intravenous PRN Orma Flaming, MD       acetaminophen (TYLENOL) tablet 650 mg  650 mg Oral Q6H PRN Orma Flaming, MD       Or   acetaminophen (TYLENOL)  suppository 650 mg  650 mg Rectal Q6H PRN Orma Flaming, MD       albuterol (PROVENTIL) (2.5 MG/3ML) 0.083% nebulizer solution 2.5 mg  2.5 mg Nebulization Q6H PRN Orma Flaming, MD   2.5 mg at 11/11/21 0929   amitriptyline (ELAVIL) tablet 25 mg  25 mg Oral Gerri Lins, MD   25 mg at 11/10/21 2137   azithromycin (ZITHROMAX) tablet 500 mg  500 mg Oral QHS Oswald Hillock, MD       cefTRIAXone (ROCEPHIN) 1 g in  sodium chloride 0.9 % 100 mL IVPB  1 g Intravenous Q24H Oswald Hillock, MD 200 mL/hr at 11/10/21 1724 1 g at 11/10/21 1724   citalopram (CELEXA) tablet 10 mg  10 mg Oral Daily Orma Flaming, MD   10 mg at 11/11/21 0950   enoxaparin (LOVENOX) injection 40 mg  40 mg Subcutaneous Q24H Orma Flaming, MD   40 mg at 11/10/21 2140   fluticasone furoate-vilanterol (BREO ELLIPTA) 100-25 MCG/ACT 1 puff  1 puff Inhalation Daily Orma Flaming, MD   1 puff at 11/10/21 0834   And   umeclidinium bromide (INCRUSE ELLIPTA) 62.5 MCG/ACT 1 puff  1 puff Inhalation Daily Orma Flaming, MD   1 puff at 11/10/21 0834   insulin aspart (novoLOG) injection 0-9 Units  0-9 Units Subcutaneous TID WC Orma Flaming, MD   1 Units at 11/11/21 0617   lidocaine (LIDODERM) 5 % 1 patch  1 patch Transdermal Q24H Godfrey Pick, MD   1 patch at 11/10/21 1710   metoprolol succinate (TOPROL-XL) 24 hr tablet 25 mg  25 mg Oral Daily Orma Flaming, MD   25 mg at 11/11/21 0950   montelukast (SINGULAIR) tablet 10 mg  10 mg Oral QHS Orma Flaming, MD   10 mg at 11/10/21 2136   morphine (PF) 2 MG/ML injection 1 mg  1 mg Intravenous Q3H PRN Orma Flaming, MD   1 mg at 11/10/21 2119   ondansetron (ZOFRAN) tablet 4 mg  4 mg Oral Q6H PRN Orma Flaming, MD       Or   ondansetron [facility]) injection 4 mg  4 mg Intravenous Q6H PRN Orma Flaming, MD   4 mg at 11/11/21 0531   pantoprazole (PROTONIX) EC tablet 80 mg  80 mg Oral Daily Orma Flaming, MD   80 mg at 11/11/21 0950   polyethylene glycol (MIRALAX / GLYCOLAX) packet 17 g  17 g Oral BID Oswald Hillock, MD   17 g at 11/11/21 0950   rosuvastatin (CRESTOR) tablet 40 mg  40 mg Oral QHS Orma Flaming, MD   40 mg at 11/10/21 2137   sodium chloride (OCEAN) 0.65 % nasal spray 2 spray  2 spray Each Nare PRN Oswald Hillock, MD   2 spray at 11/10/21 2259   sodium chloride flush (NS) 0.9 % injection 3 mL  3 mL Intravenous Q12H Orma Flaming, MD   3 mL at 11/11/21 0951   sodium chloride flush (NS) 0.9 %  injection 3 mL  3 mL Intravenous PRN Orma Flaming, MD       traMADol Veatrice [place]bon) tablet 50 mg  50 mg Oral Q12H PRN Orma Flaming, MD   50 mg at 11/11/21 1046     Discharge Medications: Please see discharge summary for a list of discharge medications.  Relevant Imaging Results:  Relevant Lab Results:   Additional Information SSN [id number]  [place],

## 2021-11-11 NOTE — Consult Note (Signed)
   [facility] Inpatient Consult   11/11/2021  Anira Senegal Winnick 08/23/1945 [id number]  Day Organization [ACO] Patient: Medicare ACO REACH  Primary Care Provider:  Rochel Brome, MD, [facility]  Patient discussed in unit progression meeting.  Notes that patient is currently being recommended for a skilled nursing facility level of care.  If the patient goes to a [facility] affiliated facility then, patient can be followed by [facility] PAC RN with traditional Medicare and approved Medicare Advantage plans.   Plan:   [facility] PAC RN can follow for any known or needs for transitional care needs for returning to post facility care or complex disease management. Will continue to follow for final disposition.  For questions or referrals, please contact:   Natividad Brood, RN BSN [place] [facility] Liaison  [phone] business mobile phone Toll free office [phone]  Fax number: [phone] [email or web address]    Plan:  Continue to follow progress and disposition to assess for post hospital care management needs.    For questions contact:   Natividad Brood, RN BSN [place]  [phone] business mobile phone Toll free office [phone]  *[place]  [phone] Fax number: [phone] [email or web address]

## 2021-11-11 NOTE — Progress Notes (Signed)
Physical Therapy Treatment Patient Details Name: Diana Hendrix MRN: [id number] DOB: [birth year] Today's Date: 11/11/2021   History of Present Illness 76 yo female with syncopal episode on toilet at home fell forward, fractured her nasal bone, R maxillary bone and has now T12 compression fracture.  Episode considered vasovagal, orthostatics are being checked.  Pt has also acute resp failure with 77% O2 sat on room air upon arrival.  Placed on 2L O2, monitor as aspiration suspected.  PMHx:  DM, OSA, HTN, HLD, CHF, GERD, vasovagal syncope    PT Comments    Pt presents supine in bed, seems to be in better spirits and eager to attempt mobility.  Pt rolls to R sidelying w/ min A and use of bed rails.  Pt attempted to transfer to sitting EOB, but unable 2/2 increased pain, 10/10.  Pt rolled to L sidelying and again unable to perform transfer.  Pt performed rolls side to side for donning on TLSO.  Pt attempted sidelying to sit but still unable to perform transfers.  Pt rolled to doff TLSO.  Nursing present to give IV pain meds, but pt requires new IV site.  Pt scooted to [facility] using LE and UE s w/ Trendelenburg position.  Pt remained in bed w/ all needs in reach and bed alarm on.    Recommendations for follow up therapy are one component of a multi-disciplinary discharge planning process, led by the attending physician.  Recommendations may be updated based on patient status, additional functional criteria and insurance authorization.  Follow Up Recommendations  Skilled nursing-short term rehab (<3 hours/day) Can patient physically be transported by private vehicle: No   Assistance Recommended at Discharge Frequent or constant Supervision/Assistance  Patient can return home with the following Two people to help with walking and/or transfers;Two people to help with bathing/dressing/bathroom;Assistance with cooking/housework;Assist for transportation;Help with stairs or ramp for entrance   Equipment  Recommendations  None recommended by PT    Recommendations for Other Services       Precautions / Restrictions Precautions Precautions: Fall;Back Required Braces or Orthoses: Spinal Brace Spinal Brace: Thoracolumbosacral orthotic Restrictions Weight Bearing Restrictions: No     Mobility  Bed Mobility Overal bed mobility: Needs Assistance Bed Mobility: Sidelying to Sit, Rolling Rolling: Min assist Sidelying to sit: Total assist (although unable to attain seated position 2/2 pain.)            Transfers                   General transfer comment: unable to tolerate    Ambulation/Gait               General Gait Details: unable to tolerate, attain sitting position 2/2 pain.   Stairs             Wheelchair Mobility    Modified Rankin (Stroke Patients Only)       Balance                                            Cognition Arousal/Alertness: Awake/alert                                              Exercises      General Comments  Pertinent Vitals/Pain Pain Assessment Pain Assessment: 0-10 Faces Pain Scale: Hurts worst Pain Location: back with sidelying to sit attempts, even w/ TLSO Pain Descriptors / Indicators: Sharp Pain Intervention(s): Limited activity within patient's tolerance     PT Goals (current goals can now be found in the care plan section) Acute Rehab PT Goals Patient Stated Goal: to go home and manage pain better PT Goal Formulation: With patient Time For Goal Achievement: 11/24/21 Potential to Achieve Goals: Good Progress towards PT goals: PT to reassess next treatment (when increased mobility.)    Frequency    Min 4X/week      PT Plan Other (comment) (further plan modificaations needed when able to increase participation.)    Co-evaluation              AM-PAC PT "6 Clicks" Mobility   Outcome Measure  Help needed turning from your back to your side  while in a flat bed without using bedrails?: A Little Help needed moving from lying on your back to sitting on the side of a flat bed without using bedrails?: Total Help needed moving to and from a bed to a chair (including a wheelchair)?: Total Help needed standing up from a chair using your arms (e.g., wheelchair or bedside chair)?: Total Help needed to walk in hospital room?: Total Help needed climbing 3-5 steps with a railing? : Total 6 Click Score: 8    End of Session Equipment Utilized During Treatment: Oxygen;Back brace (rolled for donning/doffing TLSO) Activity Tolerance: Patient limited by pain Patient left: in bed;with call bell/phone within reach;with bed alarm set Nurse Communication: Mobility status (nursing in room for meds.) PT Visit Diagnosis: Muscle weakness (generalized) (M62.81);Pain;Difficulty in walking, not elsewhere classified (R26.2);History of falling (Z91.81) Pain - part of body:  (back)     Time: 8177-1165 PT Time Calculation (min) (ACUTE ONLY): 28 min  Charges:  $Therapeutic Activity: 23-37 mins                     Ladoris Gene, PT    Ladoris Gene 11/11/2021, 11:24 AM

## 2021-11-11 NOTE — Plan of Care (Signed)
  Problem: Nutrition: Goal: Adequate nutrition will be maintained Outcome: Completed/Met

## 2021-11-11 NOTE — TOC Initial Note (Signed)
Transition of Care [facility]) - Initial/Assessment Note    Patient Details  Name: Diana Hendrix MRN: [id number] Date of Birth: Dec 07, 1945  Transition of Care [facility]) CM/SW Contact:    Bethann Berkshire, [place] Phone Number: 11/11/2021, 12:37 PM  Clinical Narrative:                  CSW met with pt bedside to discuss SNF rec. Pt lives at home with her spouse near [place], [place]. She has a son who lives nearby as well. She is agreeable to SNF workup. CSW explains workup and insurance process. Pt has a preference for [facility]. CSW completed fl2 and faxed bed requests in hub.   Expected Discharge Plan: Skilled Nursing Facility Barriers to Discharge: Continued Medical Work up, SNF Pending bed offer   Patient Goals and CMS Choice        Expected Discharge Plan and Services Expected Discharge Plan: [place]                                              Prior Living Arrangements/Services     Patient language and need for interpreter reviewed:: Yes        Need for Family Participation in Patient Care: No (Comment) Care giver support system in place?: Yes (comment)   Criminal Activity/Legal Involvement Pertinent to Current Situation/Hospitalization: No - Comment as needed  Activities of Daily Living Home Assistive Devices/Equipment: None ADL Screening (condition at time of admission) Patient's cognitive ability adequate to safely complete daily activities?: Yes Is the patient deaf or have difficulty hearing?: No Does the patient have difficulty seeing, even when wearing glasses/contacts?: No Does the patient have difficulty concentrating, remembering, or making decisions?: No Patient able to express need for assistance with ADLs?: Yes Does the patient have difficulty dressing or bathing?: No Independently performs ADLs?: Yes (appropriate for developmental age) Does the patient have difficulty walking or climbing stairs?: No Weakness of Legs:  None Weakness of Arms/Hands: None  Permission Sought/Granted                  Emotional Assessment Appearance:: Appears stated age Attitude/Demeanor/Rapport: Engaged Affect (typically observed): Accepting, Adaptable Orientation: : Oriented to Self, Oriented to Place, Oriented to  Time, Oriented to Situation Alcohol / Substance Use: Not Applicable Psych Involvement: No (comment)  Admission diagnosis:  Acute respiratory failure with hypoxia (HCC) [J96.01] Closed fracture of nasal bone, initial encounter [S02.2XXA] Compression fracture of T12 vertebra, initial encounter ([place]) [S22.080A] Multifocal pneumonia [J18.9] Closed fracture of right side of maxilla, initial encounter ([place]) [S02.40CA] Patient Active Problem List   Diagnosis Date Noted   Acute respiratory failure with hypoxia ([place]) 11/09/2021   Controlled type 2 diabetes mellitus without complication, without long-term current use of insulin ([place]) 11/09/2021   Essential hypertension 11/09/2021   Chronic diastolic CHF (congestive heart failure) ([place]) 11/09/2021   CKD (chronic kidney disease) stage 3, GFR 30-59 ml/min (HCC) 11/09/2021   Leukocytosis 11/09/2021   Allergic asthma 11/09/2021   T12 compression fracture ([place]) 11/09/2021   Nasal bone fractures 11/09/2021   Mild recurrent major depression ([place]) 01/30/2021   Bulge of cervical disc without myelopathy 08/19/2020   Macular edema, cystoid    History of colon polyps    Biceps tendonitis on right 01/23/2020   Osteopenia 01/12/2020   Positive ANA (antinuclear antibody) 12/10/2019   Elevated sedimentation  rate 12/10/2019   Hypertensive heart disease with chronic diastolic congestive heart failure ([place]) 08/14/2019   Migraine with visual aura 10/16/2018   Chronic idiopathic constipation 10/16/2018   Syncope, vasovagal 04/16/2017   Impaired fasting blood sugar 10/11/2016   S/P right knee arthroscopy 09/21/2016   Mixed hyperlipidemia 09/17/2013   OSA (obstructive sleep  apnea) 09/17/2012   GERD (gastroesophageal reflux disease) 09/17/2012   PCP:  Rochel Brome, MD Pharmacy:   [facility] [place], [place] - [address] AT [place] [place] [address] Phone: [phone] Fax: [phone]     Social Determinants of Health (SDOH) Interventions    Readmission Risk Interventions     No data to display

## 2021-11-11 NOTE — Progress Notes (Signed)
[facility]  PROGRESS NOTE  Diana Hendrix GGE:[id number] DOB: 04-27-1945 DOA: 11/09/2021 PCP: Rochel Brome, MD   Brief HPI:   76 year old female with history of OSA, hypertension, diabetes mellitus type 2, hyperlipidemia, diastolic CHF, GERD, vasovagal syncope came to ED with syncopal episode and fall at home. Patient was constipated had a bowel movement . After she had BM she felt dizzy and lightheaded.  She has history of vasovagal syncope.  She sat on toilet for a while but ended up passing out.  She like to be fell forward off toilet and fell on her face.  She also vomited while she passed out. Patient denies any chest pain or palpitations.  Patient was admitted with vasovagal syncope, CT head showed no acute finding.  Echocardiogram ordered.    Subjective   Patient seen and examined, denies dizziness.  No chest pain or shortness of breath.   Assessment/Plan:     Vasovagal syncope -Patient has had multiple episodes of vasovagal syncope -Has seen cardiology in the past -CT head showed no acute findings -Echocardiogram obtained yesterday showed EF of 60-to 65% with grade 1 diastolic dysfunction -Lasix and HCTZ are on hold -Orthostatic vital signs could not be obtained as patient could not stand due to back pain  Leukocytosis -Likely reactive -WBC is down to 11,000 -Also started empirically on ceftriaxone and Zithromax for possible pneumonia -Blood cultures is negative to date -Checked  procalcitonin; fairly less than 0.10 -We will discontinue IV antibiotics  T12 compression fracture -ED provider discussed with neurosurgery who recommended TLSO brace and outpatient follow-up -Continue Tylenol/tramadol as needed -PT OT recommends skilled nursing facility  Nasal bone fracture -Displaced fracture of nasal bone and soft tissue swelling -Consulted oral surgeon Dr. Mancel Parsons will see patient as outpatient.  Hypertension -Blood pressure is stable -Continue Toprol  XL -HCTZ and Lasix on hold due to syncope  Diabetes mellitus type 2 -Continue sliding scale insulin with NovoLog -CBG well controlled  CKD stage III -Baseline creatinine 1.1-1.2 -Stable; creatinine at baseline  Chronic diastolic CHF -Euvolemic -Echocardiogram results as above -Lasix on hold -Continue metoprolol XL  Hypokalemia -Replete  Mixed hyperlipidemia Continue crestor    GERD (gastroesophageal reflux disease) Continue PPI    Mild recurrent major depression (HCC) Continue celexa and elavil    OSA (obstructive sleep apnea) Continue cpap at night    Allergic asthma Continue trelegy and albuterol PRN  Continue singulair    Medications     amitriptyline  25 mg Oral QHS   azithromycin  500 mg Oral QHS   bisacodyl  10 mg Rectal Once   citalopram  10 mg Oral Daily   enoxaparin (LOVENOX) injection  40 mg Subcutaneous Q24H   fluticasone furoate-vilanterol  1 puff Inhalation Daily   And   umeclidinium bromide  1 puff Inhalation Daily   insulin aspart  0-9 Units Subcutaneous TID WC   lidocaine  1 patch Transdermal Q24H   metoprolol succinate  25 mg Oral Daily   montelukast  10 mg Oral QHS   pantoprazole  80 mg Oral Daily   polyethylene glycol  17 g Oral BID   rosuvastatin  40 mg Oral QHS   sodium chloride flush  3 mL Intravenous Q12H     Data Reviewed:   CBG:  Recent Labs  Lab 11/10/21 0756 11/10/21 1122 11/10/21 1649 11/10/21 2203 11/11/21 0545  GLUCAP 116* 107* 108* 167* 131*    SpO2: 94 % O2 Flow Rate (L/min): 2 L/min    Vitals:  11/11/21 0453 11/11/21 0500 11/11/21 0751 11/11/21 0932  BP:   130/69   Pulse:   78   Resp:   19   Temp:  97.9 F (36.6 C)    TempSrc:  Oral    SpO2:   94% 94%  Weight: 93.7 kg     Height:          Data Reviewed:  Basic Metabolic Panel: Recent Labs  Lab 11/08/21 1416 11/09/21 1337 11/10/21 0635 11/10/21 0645 11/10/21 1631  NA 137 132* 130* 130* 133*  K 4.2 4.2 3.1* 3.3* 3.9  CL 96 101 97* 97*  98  CO2 24  --  '24 23 25  '$ GLUCOSE 83 130* 109* 104* 100*  BUN 20 25* '13 14 10  '$ CREATININE 1.34* 1.10* 1.23* 1.26* 1.12*  CALCIUM 9.9  --  8.7* 8.7* 9.0    CBC: Recent Labs  Lab 11/09/21 1330 11/09/21 1337 11/10/21 0635 11/11/21 0101  WBC 15.8*  --  15.4* 11.0*  HGB 12.6 13.3 11.3* 11.8*  HCT 38.5 39.0 33.3* 34.6*  MCV 89.1  --  88.3 88.3  PLT 253  --  223 222    LFT Recent Labs  Lab 11/08/21 1416 11/10/21 0645  AST 27 28  ALT 20 20  ALKPHOS 85 60  BILITOT 0.4 1.0  PROT 7.5 6.0*  ALBUMIN 4.7 3.3*     Antibiotics: Anti-infectives (From admission, onward)    Start     Dose/Rate Route Frequency Ordered Stop   11/11/21 2200  azithromycin (ZITHROMAX) tablet 500 mg        500 mg Oral Daily at bedtime 11/11/21 0722 11/14/21 2159   11/10/21 1700  cefTRIAXone (ROCEPHIN) 1 g in sodium chloride 0.9 % 100 mL IVPB        1 g 200 mL/hr over 30 Minutes Intravenous Every 24 hours 11/09/21 1927 11/13/21 2359   11/09/21 2000  azithromycin (ZITHROMAX) 500 mg in sodium chloride 0.9 % 250 mL IVPB  Status:  Discontinued        500 mg 250 mL/hr over 60 Minutes Intravenous Every 24 hours 11/09/21 1927 11/11/21 0722   11/09/21 1630  cefTRIAXone (ROCEPHIN) 1 g in sodium chloride 0.9 % 100 mL IVPB        1 g 200 mL/hr over 30 Minutes Intravenous  Once 11/09/21 1618 11/09/21 1933   11/09/21 1630  doxycycline (VIBRA-TABS) tablet 100 mg  Status:  Discontinued        100 mg Oral  Once 11/09/21 1618 11/09/21 1620   11/09/21 1630  azithromycin (ZITHROMAX) 500 mg in sodium chloride 0.9 % 250 mL IVPB  Status:  Discontinued        500 mg 250 mL/hr over 60 Minutes Intravenous  Once 11/09/21 1620 11/09/21 1927        DVT prophylaxis: Lovenox  Code Status: Full code  Family Communication:    CONSULTS    Objective    Physical Examination:  General-appears in no acute distress Heart-S1-S2, regular, no murmur auscultated Lungs-clear to auscultation bilaterally, no wheezing or  crackles auscultated Abdomen-soft, nontender, no organomegaly Extremities-no edema in the lower extremities Neuro-alert, oriented x3, no focal deficit noted   Status is: Inpatient:       [place]   [facility] If 7PM-7AM, please contact night-coverage at [email or web address], Office  [phone]   11/11/2021, 10:00 AM  LOS: 2 days

## 2021-11-12 DIAGNOSIS — S022XXA Fracture of nasal bones, initial encounter for closed fracture: Secondary | ICD-10-CM | POA: Diagnosis not present

## 2021-11-12 DIAGNOSIS — I5032 Chronic diastolic (congestive) heart failure: Secondary | ICD-10-CM | POA: Diagnosis not present

## 2021-11-12 DIAGNOSIS — S22080A Wedge compression fracture of T11-T12 vertebra, initial encounter for closed fracture: Secondary | ICD-10-CM | POA: Diagnosis not present

## 2021-11-12 DIAGNOSIS — J9601 Acute respiratory failure with hypoxia: Secondary | ICD-10-CM | POA: Diagnosis not present

## 2021-11-12 LAB — GLUCOSE, CAPILLARY
Glucose-Capillary: 112 mg/dL — ABNORMAL HIGH (ref 70–99)
Glucose-Capillary: 147 mg/dL — ABNORMAL HIGH (ref 70–99)
Glucose-Capillary: 112 mg/dL — ABNORMAL HIGH (ref 70–99)
Glucose-Capillary: 138 mg/dL — ABNORMAL HIGH (ref 70–99)

## 2021-11-12 LAB — BASIC METABOLIC PANEL
Anion gap: 6 (ref 5–15)
BUN: 11 mg/dL (ref 8–23)
CO2: 26 mmol/L (ref 22–32)
Calcium: 8.7 mg/dL — ABNORMAL LOW (ref 8.9–10.3)
Chloride: 101 mmol/L (ref 98–111)
Creatinine, Ser: 1.09 mg/dL — ABNORMAL HIGH (ref 0.44–1.00)
GFR, Estimated: 53 mL/min — ABNORMAL LOW (ref >60)
Glucose, Bld: 114 mg/dL — ABNORMAL HIGH (ref 70–99)
Potassium: 4 mmol/L (ref 3.5–5.1)
Sodium: 133 mmol/L — ABNORMAL LOW (ref 135–145)

## 2021-11-12 MED ORDER — FUROSEMIDE 10 MG/ML IJ SOLN
20.0000 mg | Freq: Every day | INTRAMUSCULAR | Status: DC
  Administered 2021-11-12 – 2021-11-13 (×2): 20 mg via INTRAVENOUS
  Filled 2021-11-12 (×2): qty 2

## 2021-11-12 NOTE — Progress Notes (Signed)
Pt refused CPAP due to broken nose.

## 2021-11-12 NOTE — Progress Notes (Signed)
[facility]  PROGRESS NOTE  Diana Hendrix [id number]:[id number] DOB: 1945-10-24 DOA: 11/09/2021 PCP: Rochel Brome, MD   Brief HPI:   76 year old female with history of OSA, hypertension, diabetes mellitus type 2, hyperlipidemia, diastolic CHF, GERD, vasovagal syncope came to ED with syncopal episode and fall at home. Patient was constipated had a bowel movement . After she had BM she felt dizzy and lightheaded.  She has history of vasovagal syncope.  She sat on toilet for a while but ended up passing out.  She like to be fell forward off toilet and fell on her face.  She also vomited while she passed out. Patient denies any chest pain or palpitations.  Patient was admitted with vasovagal syncope, CT head showed no acute finding.  Echocardiogram ordered.    Subjective   Patient seen and examined, denies chest pain or shortness of breath.  Still requiring oxygen.   Assessment/Plan:     Vasovagal syncope -Patient has had multiple episodes of vasovagal syncope -Has seen cardiology in the past -CT head showed no acute findings -Echocardiogram obtained yesterday showed EF of 60-to 65% with grade 1 diastolic dysfunction -Lasix and HCTZ were initially held; Lasix has been restarted -Orthostatic vital signs could not be obtained as patient could not stand due to back pain  Leukocytosis -Likely reactive -WBC is down to 11,000 -Also started empirically on ceftriaxone and Zithromax for possible pneumonia -Blood cultures is negative to date -Checked  procalcitonin; fairly less than 0.10 -IV antibiotics were discontinued  T12 compression fracture -ED provider discussed with neurosurgery who recommended TLSO brace and outpatient follow-up -Continue Tylenol/tramadol as needed -PT OT recommends skilled nursing facility  Nasal bone fracture -Displaced fracture of nasal bone and soft tissue swelling -Consulted oral surgeon Dr. Mancel Parsons will see patient as outpatient.  Hypertension -Blood  pressure is stable -Continue Toprol XL -HCTZ and Lasix were initially held  due to syncope -Lasix has been restarted -will switch to IV lasix 20 mg daily; check bmp in a.m.   Diabetes mellitus type 2 -Continue sliding scale insulin with NovoLog -CBG well controlled  CKD stage III -Baseline creatinine 1.1-1.2 -Stable; creatinine at baseline  Acute on chronic diastolic CHF -Patient is currently requiring 2 L/min of oxygen via nasal cannula -Echocardiogram results as above -Lasix has been restarted -Continue metoprolol XL -We will try to wean off oxygen as tolerated  Hypokalemia -Replete  Mixed hyperlipidemia Continue crestor    GERD (gastroesophageal reflux disease) Continue PPI    Mild recurrent major depression (HCC) Continue celexa and elavil    OSA (obstructive sleep apnea) Continue cpap at night    Allergic asthma Continue trelegy and albuterol PRN  Continue singulair    Medications     amitriptyline  25 mg Oral QHS   citalopram  10 mg Oral Daily   enoxaparin (LOVENOX) injection  40 mg Subcutaneous Q24H   fluticasone furoate-vilanterol  1 puff Inhalation Daily   And   umeclidinium bromide  1 puff Inhalation Daily   furosemide  20 mg Oral Daily   insulin aspart  0-9 Units Subcutaneous TID WC   lidocaine  1 patch Transdermal Q24H   metoprolol succinate  25 mg Oral Daily   montelukast  10 mg Oral QHS   pantoprazole  80 mg Oral Daily   polyethylene glycol  17 g Oral BID   rosuvastatin  40 mg Oral QHS   sodium chloride flush  3 mL Intravenous Q12H     Data Reviewed:   CBG:  Recent Labs  Lab 11/11/21 0545 11/11/21 1052 11/11/21 1640 11/11/21 2108 11/12/21 0600  GLUCAP 131* 119* 98 113* 112*    SpO2: 93 % O2 Flow Rate (L/min): 2 L/min    Vitals:   11/11/21 1951 11/12/21 0419 11/12/21 0727 11/12/21 0834  BP: (!) 147/73 (!) 145/76  137/80  Pulse:  74  80  Resp: 20 (!) 22  20  Temp: 98.6 F (37 C) 98 F (36.7 C)  97.8 F (36.6 C)   TempSrc: Oral Oral    SpO2: 92% 92% 93% 93%  Weight:  94.7 kg    Height:          Data Reviewed:  Basic Metabolic Panel: Recent Labs  Lab 11/08/21 1416 11/09/21 1337 11/10/21 0635 11/10/21 0645 11/10/21 1631  NA 137 132* 130* 130* 133*  K 4.2 4.2 3.1* 3.3* 3.9  CL 96 101 97* 97* 98  CO2 24  --  '24 23 25  '$ GLUCOSE 83 130* 109* 104* 100*  BUN 20 25* '13 14 10  '$ CREATININE 1.34* 1.10* 1.23* 1.26* 1.12*  CALCIUM 9.9  --  8.7* 8.7* 9.0    CBC: Recent Labs  Lab 11/09/21 1330 11/09/21 1337 11/10/21 0635 11/11/21 0101  WBC 15.8*  --  15.4* 11.0*  HGB 12.6 13.3 11.3* 11.8*  HCT 38.5 39.0 33.3* 34.6*  MCV 89.1  --  88.3 88.3  PLT 253  --  223 222    LFT Recent Labs  Lab 11/08/21 1416 11/10/21 0645  AST 27 28  ALT 20 20  ALKPHOS 85 60  BILITOT 0.4 1.0  PROT 7.5 6.0*  ALBUMIN 4.7 3.3*     Antibiotics: Anti-infectives (From admission, onward)    Start     Dose/Rate Route Frequency Ordered Stop   11/11/21 2200  azithromycin (ZITHROMAX) tablet 500 mg  Status:  Discontinued        500 mg Oral Daily at bedtime 11/11/21 0722 11/11/21 1411   11/10/21 1700  cefTRIAXone (ROCEPHIN) 1 g in sodium chloride 0.9 % 100 mL IVPB  Status:  Discontinued        1 g 200 mL/hr over 30 Minutes Intravenous Every 24 hours 11/09/21 1927 11/11/21 1411   11/09/21 2000  azithromycin (ZITHROMAX) 500 mg in sodium chloride 0.9 % 250 mL IVPB  Status:  Discontinued        500 mg 250 mL/hr over 60 Minutes Intravenous Every 24 hours 11/09/21 1927 11/11/21 0722   11/09/21 1630  cefTRIAXone (ROCEPHIN) 1 g in sodium chloride 0.9 % 100 mL IVPB        1 g 200 mL/hr over 30 Minutes Intravenous  Once 11/09/21 1618 11/09/21 1933   11/09/21 1630  doxycycline (VIBRA-TABS) tablet 100 mg  Status:  Discontinued        100 mg Oral  Once 11/09/21 1618 11/09/21 1620   11/09/21 1630  azithromycin (ZITHROMAX) 500 mg in sodium chloride 0.9 % 250 mL IVPB  Status:  Discontinued        500 mg 250 mL/hr over 60  Minutes Intravenous  Once 11/09/21 1620 11/09/21 1927        DVT prophylaxis: Lovenox  Code Status: Full code  Family Communication:    CONSULTS    Objective    Physical Examination:  General-appears in no acute distress Heart-S1-S2, regular, no murmur auscultated Lungs-clear to auscultation bilaterally, no wheezing or crackles auscultated Abdomen-soft, nontender, no organomegaly Extremities-no edema in the lower extremities Neuro-alert, oriented x3, no focal deficit noted  Status is: Inpatient:       [place] [place]   [facility] If 7PM-7AM, please contact night-coverage at [email or web address], Office  [phone]   11/12/2021, 9:15 AM  LOS: 3 days

## 2021-11-13 DIAGNOSIS — S022XXA Fracture of nasal bones, initial encounter for closed fracture: Secondary | ICD-10-CM | POA: Diagnosis not present

## 2021-11-13 DIAGNOSIS — J9601 Acute respiratory failure with hypoxia: Secondary | ICD-10-CM | POA: Diagnosis not present

## 2021-11-13 DIAGNOSIS — S22080A Wedge compression fracture of T11-T12 vertebra, initial encounter for closed fracture: Secondary | ICD-10-CM | POA: Diagnosis not present

## 2021-11-13 DIAGNOSIS — I5032 Chronic diastolic (congestive) heart failure: Secondary | ICD-10-CM | POA: Diagnosis not present

## 2021-11-13 LAB — BASIC METABOLIC PANEL
Anion gap: 8 (ref 5–15)
BUN: 14 mg/dL (ref 8–23)
CO2: 29 mmol/L (ref 22–32)
Calcium: 9 mg/dL (ref 8.9–10.3)
Chloride: 95 mmol/L — ABNORMAL LOW (ref 98–111)
Creatinine, Ser: 1.22 mg/dL — ABNORMAL HIGH (ref 0.44–1.00)
GFR, Estimated: 46 mL/min — ABNORMAL LOW (ref >60)
Glucose, Bld: 121 mg/dL — ABNORMAL HIGH (ref 70–99)
Potassium: 4.3 mmol/L (ref 3.5–5.1)
Sodium: 132 mmol/L — ABNORMAL LOW (ref 135–145)

## 2021-11-13 LAB — GLUCOSE, CAPILLARY
Glucose-Capillary: 112 mg/dL — ABNORMAL HIGH (ref 70–99)
Glucose-Capillary: 99 mg/dL (ref 70–99)
Glucose-Capillary: 96 mg/dL (ref 70–99)
Glucose-Capillary: 109 mg/dL — ABNORMAL HIGH (ref 70–99)

## 2021-11-13 MED ORDER — FLUTICASONE PROPIONATE 50 MCG/ACT NA SUSP
2.0000 | Freq: Every day | NASAL | Status: DC
  Administered 2021-11-13 – 2021-11-14 (×2): 2 via NASAL
  Filled 2021-11-13: qty 16

## 2021-11-13 MED ORDER — GUAIFENESIN ER 600 MG PO TB12
600.0000 mg | ORAL_TABLET | Freq: Two times a day (BID) | ORAL | Status: DC
  Administered 2021-11-13 – 2021-11-14 (×3): 600 mg via ORAL
  Filled 2021-11-13 (×3): qty 1

## 2021-11-13 NOTE — TOC Progression Note (Signed)
Transition of Care [facility]) - Progression Note    Patient Details  Name: Diana Hendrix MRN: [id number] Date of Birth: Mar 07, 1945  [facility]) CM/SW [place], LCSW Phone Number: 11/13/2021, 2:33 PM  Clinical Narrative:     CSW spoke with pt in regards to her bed offers. Pt choose [facility]. Please follow up with facility to confirm bed.  TOC team will continue to assist with discharge planning needs.   Expected Discharge Plan: Skilled Nursing Facility Barriers to Discharge: Continued Medical Work up, SNF Pending bed offer  Expected Discharge Plan and Services Expected Discharge Plan: [place]                                               Social Determinants of Health (SDOH) Interventions    Readmission Risk Interventions   No data to display

## 2021-11-13 NOTE — Progress Notes (Signed)
[facility]  PROGRESS NOTE  Diana Hendrix QZR:[id number] DOB: 08/02/1945 DOA: 11/09/2021 PCP: Rochel Brome, MD   Brief HPI:   76 year old female with history of OSA, hypertension, diabetes mellitus type 2, hyperlipidemia, diastolic CHF, GERD, vasovagal syncope came to ED with syncopal episode and fall at home. Patient was constipated had a bowel movement . After she had BM she felt dizzy and lightheaded.  She has history of vasovagal syncope.  She sat on toilet for a while but ended up passing out.  She like to be fell forward off toilet and fell on her face.  She also vomited while she passed out. Patient denies any chest pain or palpitations.  Patient was admitted with vasovagal syncope, CT head showed no acute finding.  Echocardiogram ordered.    Subjective   Patient seen and examined, complains of dryness in the nose.  She has been weaned off of oxygen.   Assessment/Plan:     Vasovagal syncope -Patient has had multiple episodes of vasovagal syncope -Has seen cardiology in the past -CT head showed no acute findings -Echocardiogram obtained yesterday showed EF of 60-to 65% with grade 1 diastolic dysfunction -Lasix and HCTZ were initially held; Lasix was restarted yesterday -Orthostatic vital signs could not be obtained as patient could not stand due to back pain  Leukocytosis -Likely reactive -WBC is down to 11,000 -Also started empirically on ceftriaxone and Zithromax for possible pneumonia -Blood cultures is negative to date -Checked  procalcitonin; fairly less than 0.10 -IV antibiotics were discontinued  T12 compression fracture -ED provider discussed with neurosurgery who recommended TLSO brace and outpatient follow-up -Continue Tylenol/tramadol as needed -PT OT recommends skilled nursing facility  Nasal bone fracture -Displaced fracture of nasal bone and soft tissue swelling -Consulted oral surgeon Dr. Mancel Parsons will see patient as outpatient. -We will start  Flonase daily; continue nasal spray as needed -Start Mucinex 600 mg p.o. twice daily  Hypertension -Blood pressure is stable -Continue Toprol XL -HCTZ and Lasix were initially held  due to syncope -Lasix was restarted yesterday -Creatinine now little bit elevated, will hold further doses of IV Lasix   Diabetes mellitus type 2 -Continue sliding scale insulin with NovoLog -CBG well controlled  CKD stage III -Baseline creatinine 1.1-1.2 -Stable; creatinine at baseline  Acute on chronic diastolic CHF -Patient was requiring  2 L/min of oxygen via nasal cannula -Echocardiogram results as above -Lasix was restarted yesterday, oxygen has been weaned off to room air -Continue metoprolol XL   Hypokalemia -Replete  Mixed hyperlipidemia Continue crestor    GERD (gastroesophageal reflux disease) Continue PPI    Mild recurrent major depression (HCC) Continue celexa and elavil    OSA (obstructive sleep apnea) Continue cpap at night    Allergic asthma Continue trelegy and albuterol PRN  Continue singulair    Medications     amitriptyline  25 mg Oral QHS   citalopram  10 mg Oral Daily   enoxaparin (LOVENOX) injection  40 mg Subcutaneous Q24H   fluticasone furoate-vilanterol  1 puff Inhalation Daily   And   umeclidinium bromide  1 puff Inhalation Daily   insulin aspart  0-9 Units Subcutaneous TID WC   lidocaine  1 patch Transdermal Q24H   metoprolol succinate  25 mg Oral Daily   montelukast  10 mg Oral QHS   pantoprazole  80 mg Oral Daily   polyethylene glycol  17 g Oral BID   rosuvastatin  40 mg Oral QHS   sodium chloride flush  3 mL  Intravenous Q12H     Data Reviewed:   CBG:  Recent Labs  Lab 11/12/21 0600 11/12/21 1234 11/12/21 1701 11/12/21 2105 11/13/21 0545  GLUCAP 112* 147* 112* 138* 112*    SpO2: 100 % O2 Flow Rate (L/min): 2 L/min    Vitals:   11/12/21 1715 11/12/21 2107 11/13/21 0546 11/13/21 0911  BP: 135/72 139/74  (!) 141/73  Pulse: 82  71 69   Resp: 20   20  Temp:  98.5 F (36.9 C) 97.9 F (36.6 C)   TempSrc:  Oral Oral   SpO2: 93%   100%  Weight:   94.8 kg   Height:          Data Reviewed:  Basic Metabolic Panel: Recent Labs  Lab 11/10/21 0635 11/10/21 0645 11/10/21 1631 11/12/21 0957 11/13/21 0043  NA 130* 130* 133* 133* 132*  K 3.1* 3.3* 3.9 4.0 4.3  CL 97* 97* 98 101 95*  CO2 '24 23 25 26 29  '$ GLUCOSE 109* 104* 100* 114* 121*  BUN '13 14 10 11 14  '$ CREATININE 1.23* 1.26* 1.12* 1.09* 1.22*  CALCIUM 8.7* 8.7* 9.0 8.7* 9.0    CBC: Recent Labs  Lab 11/09/21 1330 11/09/21 1337 11/10/21 0635 11/11/21 0101  WBC 15.8*  --  15.4* 11.0*  HGB 12.6 13.3 11.3* 11.8*  HCT 38.5 39.0 33.3* 34.6*  MCV 89.1  --  88.3 88.3  PLT 253  --  223 222    LFT Recent Labs  Lab 11/08/21 1416 11/10/21 0645  AST 27 28  ALT 20 20  ALKPHOS 85 60  BILITOT 0.4 1.0  PROT 7.5 6.0*  ALBUMIN 4.7 3.3*     Antibiotics: Anti-infectives (From admission, onward)    Start     Dose/Rate Route Frequency Ordered Stop   11/11/21 2200  azithromycin (ZITHROMAX) tablet 500 mg  Status:  Discontinued        500 mg Oral Daily at bedtime 11/11/21 0722 11/11/21 1411   11/10/21 1700  cefTRIAXone (ROCEPHIN) 1 g in sodium chloride 0.9 % 100 mL IVPB  Status:  Discontinued        1 g 200 mL/hr over 30 Minutes Intravenous Every 24 hours 11/09/21 1927 11/11/21 1411   11/09/21 2000  azithromycin (ZITHROMAX) 500 mg in sodium chloride 0.9 % 250 mL IVPB  Status:  Discontinued        500 mg 250 mL/hr over 60 Minutes Intravenous Every 24 hours 11/09/21 1927 11/11/21 0722   11/09/21 1630  cefTRIAXone (ROCEPHIN) 1 g in sodium chloride 0.9 % 100 mL IVPB        1 g 200 mL/hr over 30 Minutes Intravenous  Once 11/09/21 1618 11/09/21 1933   11/09/21 1630  doxycycline (VIBRA-TABS) tablet 100 mg  Status:  Discontinued        100 mg Oral  Once 11/09/21 1618 11/09/21 1620   11/09/21 1630  azithromycin (ZITHROMAX) 500 mg in sodium chloride 0.9 % 250 mL  IVPB  Status:  Discontinued        500 mg 250 mL/hr over 60 Minutes Intravenous  Once 11/09/21 1620 11/09/21 1927        DVT prophylaxis: Lovenox  Code Status: Full code  Family Communication:    CONSULTS    Objective    Physical Examination:  General-appears in no acute distress Heart-S1-S2, regular, no murmur auscultated Lungs-clear to auscultation bilaterally, no wheezing or crackles auscultated Abdomen-soft, nontender, no organomegaly Extremities-no edema in the lower extremities Neuro-alert, oriented x3, no  focal deficit noted   Status is: Inpatient:       Oswald Hillock   [facility] If 7PM-7AM, please contact night-coverage at [email or web address], Office  [phone]   11/13/2021, 10:41 AM  LOS: 4 days

## 2021-11-14 DIAGNOSIS — R531 Weakness: Secondary | ICD-10-CM | POA: Diagnosis not present

## 2021-11-14 DIAGNOSIS — K5904 Chronic idiopathic constipation: Secondary | ICD-10-CM | POA: Diagnosis not present

## 2021-11-14 DIAGNOSIS — I10 Essential (primary) hypertension: Secondary | ICD-10-CM | POA: Diagnosis not present

## 2021-11-14 DIAGNOSIS — S22080D Wedge compression fracture of T11-T12 vertebra, subsequent encounter for fracture with routine healing: Secondary | ICD-10-CM | POA: Diagnosis not present

## 2021-11-14 DIAGNOSIS — R55 Syncope and collapse: Secondary | ICD-10-CM | POA: Diagnosis not present

## 2021-11-14 DIAGNOSIS — R2681 Unsteadiness on feet: Secondary | ICD-10-CM | POA: Diagnosis not present

## 2021-11-14 DIAGNOSIS — S22080A Wedge compression fracture of T11-T12 vertebra, initial encounter for closed fracture: Secondary | ICD-10-CM | POA: Diagnosis not present

## 2021-11-14 DIAGNOSIS — R279 Unspecified lack of coordination: Secondary | ICD-10-CM | POA: Diagnosis not present

## 2021-11-14 DIAGNOSIS — R262 Difficulty in walking, not elsewhere classified: Secondary | ICD-10-CM | POA: Diagnosis not present

## 2021-11-14 DIAGNOSIS — J45909 Unspecified asthma, uncomplicated: Secondary | ICD-10-CM | POA: Diagnosis not present

## 2021-11-14 DIAGNOSIS — M810 Age-related osteoporosis without current pathological fracture: Secondary | ICD-10-CM | POA: Diagnosis not present

## 2021-11-14 DIAGNOSIS — S022XXD Fracture of nasal bones, subsequent encounter for fracture with routine healing: Secondary | ICD-10-CM | POA: Diagnosis not present

## 2021-11-14 DIAGNOSIS — J9601 Acute respiratory failure with hypoxia: Secondary | ICD-10-CM | POA: Diagnosis not present

## 2021-11-14 DIAGNOSIS — Z7401 Bed confinement status: Secondary | ICD-10-CM | POA: Diagnosis not present

## 2021-11-14 DIAGNOSIS — N1831 Chronic kidney disease, stage 3a: Secondary | ICD-10-CM | POA: Diagnosis not present

## 2021-11-14 DIAGNOSIS — M5030 Other cervical disc degeneration, unspecified cervical region: Secondary | ICD-10-CM | POA: Diagnosis not present

## 2021-11-14 DIAGNOSIS — D72829 Elevated white blood cell count, unspecified: Secondary | ICD-10-CM | POA: Diagnosis not present

## 2021-11-14 DIAGNOSIS — Z9181 History of falling: Secondary | ICD-10-CM | POA: Diagnosis not present

## 2021-11-14 DIAGNOSIS — N183 Chronic kidney disease, stage 3 unspecified: Secondary | ICD-10-CM | POA: Diagnosis not present

## 2021-11-14 DIAGNOSIS — I5032 Chronic diastolic (congestive) heart failure: Secondary | ICD-10-CM | POA: Diagnosis not present

## 2021-11-14 DIAGNOSIS — M545 Low back pain, unspecified: Secondary | ICD-10-CM | POA: Diagnosis not present

## 2021-11-14 DIAGNOSIS — I5033 Acute on chronic diastolic (congestive) heart failure: Secondary | ICD-10-CM | POA: Diagnosis not present

## 2021-11-14 DIAGNOSIS — F330 Major depressive disorder, recurrent, mild: Secondary | ICD-10-CM | POA: Diagnosis not present

## 2021-11-14 DIAGNOSIS — K219 Gastro-esophageal reflux disease without esophagitis: Secondary | ICD-10-CM | POA: Diagnosis not present

## 2021-11-14 DIAGNOSIS — G4733 Obstructive sleep apnea (adult) (pediatric): Secondary | ICD-10-CM | POA: Diagnosis not present

## 2021-11-14 DIAGNOSIS — E118 Type 2 diabetes mellitus with unspecified complications: Secondary | ICD-10-CM | POA: Diagnosis not present

## 2021-11-14 DIAGNOSIS — I110 Hypertensive heart disease with heart failure: Secondary | ICD-10-CM | POA: Diagnosis not present

## 2021-11-14 DIAGNOSIS — M6281 Muscle weakness (generalized): Secondary | ICD-10-CM | POA: Diagnosis not present

## 2021-11-14 DIAGNOSIS — E782 Mixed hyperlipidemia: Secondary | ICD-10-CM | POA: Diagnosis not present

## 2021-11-14 LAB — GLUCOSE, CAPILLARY
Glucose-Capillary: 118 mg/dL — ABNORMAL HIGH (ref 70–99)
Glucose-Capillary: 90 mg/dL (ref 70–99)

## 2021-11-14 LAB — CULTURE, BLOOD (ROUTINE X 2): Culture: NO GROWTH

## 2021-11-14 LAB — LEGIONELLA PNEUMOPHILA SEROGP 1 UR AG: L. pneumophila Serogp 1 Ur Ag: NEGATIVE

## 2021-11-14 MED ORDER — GUAIFENESIN ER 600 MG PO TB12: 600.0000 mg | ORAL_TABLET | Freq: Two times a day (BID) | ORAL | Status: DC

## 2021-11-14 MED ORDER — TRAMADOL HCL 50 MG PO TABS: 50.0000 mg | ORAL_TABLET | Freq: Two times a day (BID) | ORAL | 0 refills | Status: DC | PRN

## 2021-11-14 MED ORDER — SALINE SPRAY 0.65 % NA SOLN: 2.0000 | NASAL | 0 refills | Status: DC | PRN

## 2021-11-14 MED ORDER — LIDOCAINE 5 % EX PTCH: 1.0000 | MEDICATED_PATCH | CUTANEOUS | 0 refills | Status: DC

## 2021-11-14 MED ORDER — ACETAMINOPHEN 325 MG PO TABS: 650.0000 mg | ORAL_TABLET | Freq: Four times a day (QID) | ORAL | Status: DC | PRN

## 2021-11-14 NOTE — Care Management Important Message (Signed)
Important Message  Patient Details  Name: Diana Hendrix MRN: [id number] Date of Birth: 08-28-1945   Medicare Important Message Given:  Yes     Shelda Altes 11/14/2021, 8:05 AM

## 2021-11-14 NOTE — Discharge Summary (Addendum)
Physician Discharge Summary   Patient: Diana Hendrix MRN: [id number] DOB: 1945-02-19  Admit date:     11/09/2021  Discharge date: 11/14/21  Discharge Physician: Oswald Hillock   PCP: Rochel Brome, MD   Recommendations at discharge:   Follow-up oral surgeon Dr. Mancel Parsons as outpatient for nasal bone fracture Follow-up neurosurgery Dr. Christella Noa as outpatient  Discharge Diagnoses: Principal Problem:   Acute respiratory failure with hypoxia ([place]) Active Problems:   Syncope, vasovagal   Leukocytosis   T12 compression fracture ([place])   Essential hypertension   Nasal bone fractures   Controlled type 2 diabetes mellitus without complication, without long-term current use of insulin (HCC)   Chronic diastolic CHF (congestive heart failure) (HCC)   CKD (chronic kidney disease) stage 3, GFR 30-59 ml/min (HCC)   Mixed hyperlipidemia   GERD (gastroesophageal reflux disease)   Mild recurrent major depression (HCC)   OSA (obstructive sleep apnea)   Allergic asthma  Resolved Problems:   * No resolved hospital problems. *  Hospital Course:  76 year old female with history of OSA, hypertension, diabetes mellitus type 2, hyperlipidemia, diastolic CHF, GERD, vasovagal syncope came to ED with syncopal episode and fall at home. Patient was constipated had a bowel movement . After she had BM she felt dizzy and lightheaded.  She has history of vasovagal syncope.  She sat on toilet for a while but ended up passing out.  She like to be fell forward off toilet and fell on her face.  She also vomited while she passed out. Patient denies any chest pain or palpitations.   Patient was admitted with vasovagal syncope, CT head showed no acute finding.  Echocardiogram ordered.    Assessment and Plan:  Vasovagal syncope -Patient has had multiple episodes of vasovagal syncope -Has seen cardiology in the past -Telemetry showed no abnormal rhythm -CT head showed no acute findings -Echocardiogram obtained  yesterday showed EF of 60-to 65% with grade 1 diastolic dysfunction -Lasix and HCTZ were initially held; Lasix was restarted yesterday -Orthostatic vital signs could not be obtained as patient could not stand due to back pain   Leukocytosis -Likely reactive -WBC is down to 11,000 -Also started empirically on ceftriaxone and Zithromax for possible pneumonia -Blood cultures is negative to date -Checked  procalcitonin; fairly less than 0.10 -IV antibiotics were discontinued   T12 compression fracture -ED provider discussed with neurosurgery who recommended TLSO brace and outpatient follow-up -Continue Tylenol/tramadol as needed -PT OT recommends skilled nursing facility -Follow-up neurosurgery Dr. Christella Noa as outpatient in 4 weeks Hello   Nasal bone fracture -Displaced fracture of nasal bone and soft tissue swelling -Consulted oral surgeon Dr. Mancel Parsons will see patient as outpatient. -We will start Flonase daily; continue nasal spray as needed -Start Mucinex 600 mg p.o. twice daily   Hypertension -Blood pressure is stable -Continue Toprol XL -HCTZ and Lasix were initially held  due to syncope -Lasix was restarted yesterday -Olmesartan/HCTZ has been discontinued  Diabetes mellitus type 2 -Not on medications at home -Hemoglobin A1c 6.5   CKD stage III -Baseline creatinine 1.1-1.2 -Stable; creatinine at baseline   Acute on chronic diastolic CHF -Patient was requiring  2 L/min of oxygen via nasal cannula -Echocardiogram results as above -Lasix was restarted yesterday, oxygen has been weaned off to room air -Continue metoprolol XL     Hypokalemia -Replete   Mixed hyperlipidemia Continue crestor    GERD (gastroesophageal reflux disease) Continue PPI    Mild recurrent major depression (HCC) Continue celexa and elavil  OSA (obstructive sleep apnea) Continue cpap at night    Allergic asthma Continue trelegy and albuterol PRN  Continue singulair       Consultants:   Procedures performed:  Disposition: Skilled nursing facility Diet recommendation:  Discharge Diet Orders (From admission, onward)     Start     Ordered   11/14/21 0000  Diet - low sodium heart healthy        11/14/21 1033           Regular diet DISCHARGE MEDICATION: Allergies as of 11/14/2021       Reactions   Zyrtec [cetirizine]    Weird dreams.         Medication List     STOP taking these medications    olmesartan-hydrochlorothiazide 20-12.5 MG tablet Commonly known as: BENICAR HCT       TAKE these medications    acetaminophen 325 MG tablet Commonly known as: TYLENOL Take 2 tablets (650 mg total) by mouth every 6 (six) hours as needed for mild pain (or Fever >/= 101).   albuterol (2.5 MG/3ML) 0.083% nebulizer solution Commonly known as: PROVENTIL Take 3 mLs (2.5 mg total) by nebulization every 6 (six) hours as needed for wheezing or shortness of breath. What changed: Another medication with the same name was removed. Continue taking this medication, and follow the directions you see here.   amitriptyline 25 MG tablet Commonly known as: ELAVIL TAKE 1 TABLET(25 MG) BY MOUTH AT BEDTIME What changed: See the new instructions.   citalopram 10 MG tablet Commonly known as: CELEXA TAKE 1 TABLET(10 MG) BY MOUTH DAILY What changed: See the new instructions.   fluticasone 50 MCG/ACT nasal spray Commonly known as: FLONASE Place 1 spray into both nostrils at bedtime.   furosemide 20 MG tablet Commonly known as: LASIX TAKE 1 TABLET(20 MG) BY MOUTH DAILY What changed: See the new instructions.   guaiFENesin 600 MG 12 hr tablet Commonly known as: MUCINEX Take 1 tablet (600 mg total) by mouth 2 (two) times daily.   lidocaine 5 % Commonly known as: LIDODERM Place 1 patch onto the skin daily. Remove & Discard patch within 12 hours or as directed by MD   metoprolol succinate 50 MG 24 hr tablet Commonly known as: TOPROL-XL Take 0.5 tablets (25 mg total) by  mouth daily. Take with or immediately following a meal.   montelukast 10 MG tablet Commonly known as: SINGULAIR TAKE 1 TABLET(10 MG) BY MOUTH AT BEDTIME What changed:  how much to take how to take this when to take this additional instructions   NON FORMULARY CPAP at bedtime   omeprazole 40 MG capsule Commonly known as: PRILOSEC Take 1 capsule (40 mg total) by mouth daily.   polyethylene glycol 17 g packet Commonly known as: MIRALAX / GLYCOLAX Take 17 g by mouth daily.   rosuvastatin 40 MG tablet Commonly known as: CRESTOR TAKE 1 TABLET(40 MG) BY MOUTH DAILY What changed: See the new instructions.   sodium chloride 0.65 % Soln nasal spray Commonly known as: OCEAN Place 2 sprays into both nostrils as needed for congestion.   SYSTANE OP Place 1 drop into both eyes 4 (four) times daily.   traMADol 50 MG tablet Commonly known as: ULTRAM Take 1 tablet (50 mg total) by mouth every 12 (twelve) hours as needed for moderate pain.   Trelegy Ellipta 100-62.5-25 MCG/ACT Aepb Generic drug: Fluticasone-Umeclidin-Vilant Inhale 1 puff into the lungs daily in the afternoon. What changed: Another medication with the same name  was removed. Continue taking this medication, and follow the directions you see here.   Vitamin D3 50 MCG (2000 UT) Tabs Take 1 tablet by mouth daily.        Follow-up Information     Ashok Pall, MD. Schedule an appointment as soon as possible for a visit in 4 weeks.   Specialty: Neurosurgery Why: For your spinal injury Contact information: [address] [place] [address] [phone]         Michael Litter, DMD. Schedule an appointment as soon as possible for a visit in 1 week(s).   Specialty: Dentistry Contact information: [place] [address] [phone]                Discharge Exam: Danley Danker Weights   11/12/21 0419 11/13/21 0546 11/14/21 0646  Weight: 94.7 kg 94.8 kg 93.5 kg    General-appears in no acute distress Heart-S1-S2, regular, no murmur auscultated Lungs-clear to auscultation bilaterally, no wheezing or crackles auscultated Abdomen-soft, nontender, no organomegaly Extremities-no edema in the lower extremities Neuro-alert, oriented x3, no focal deficit noted  Condition at discharge: good  The results of significant diagnostics from this hospitalization (including imaging, microbiology, ancillary and laboratory) are listed below for reference.   Imaging Studies: ECHOCARDIOGRAM COMPLETE  Result Date: 11/10/2021    ECHOCARDIOGRAM REPORT   Patient Name:   PRICELLA GAUGH Froio Date of Exam: 11/10/2021 Medical Rec #:  [id number]        Height:       62.0 in Accession #:    [id number]       Weight:       203.0 lb Date of Birth:  09-Oct-1945        BSA:          1.924 m Patient Age:    35 years         BP:           115/69 mmHg Patient Gender: F                HR:           77 bpm. Exam Location:  Inpatient Procedure: 2D Echo, Color Doppler and Cardiac Doppler Indications:    syncope  History:        Patient has prior history of Echocardiogram examinations, most                 recent 05/07/2019. CHF; Risk Factors:Dyslipidemia, Hypertension,                 Sleep Apnea and Diabetes.  Sonographer:    Melissa Morford RDCS (AE, PE) Referring Phys: [id number] [place]  1. Left ventricular ejection fraction, by estimation, is 60 to 65%. The left ventricle has normal function. The left ventricle has no regional wall motion abnormalities. Left ventricular diastolic parameters are consistent with Grade I diastolic dysfunction (impaired relaxation).  2. Right ventricular systolic function is normal. The right ventricular size is not well visualized. There is mildly elevated pulmonary artery systolic pressure.  3. No evidence of mitral valve regurgitation.  4. Aortic valve regurgitation is not visualized. Aortic valve sclerosis/calcification is present, without any evidence  of aortic stenosis.  5. The inferior vena cava is normal in size with greater than 50% respiratory variability, suggesting right atrial pressure of 3 mmHg. Comparison(s): No significant change from prior study. FINDINGS  Left Ventricle: Left ventricular ejection fraction, by estimation, is 60 to 65%. The left  ventricle has normal function. The left ventricle has no regional wall motion abnormalities. The left ventricular internal cavity size was normal in size. There is  no left ventricular hypertrophy. Left ventricular diastolic parameters are consistent with Grade I diastolic dysfunction (impaired relaxation). Right Ventricle: The right ventricular size is not well visualized. Right ventricular systolic function is normal. There is mildly elevated pulmonary artery systolic pressure. The tricuspid regurgitant velocity is 2.89 m/s, and with an assumed right atrial pressure of 3 mmHg, the estimated right ventricular systolic pressure is 72.6 mmHg. Left Atrium: Left atrial size was normal in size. Right Atrium: Right atrial size was normal in size. Pericardium: There is no evidence of pericardial effusion. Presence of epicardial fat layer. Mitral Valve: Mild to moderate mitral annular calcification. No evidence of mitral valve regurgitation. Tricuspid Valve: Tricuspid valve regurgitation is trivial. Aortic Valve: Aortic valve regurgitation is not visualized. Aortic valve sclerosis/calcification is present, without any evidence of aortic stenosis. Pulmonic Valve: The pulmonic valve was not well visualized. Pulmonic valve regurgitation is trivial. Aorta: The aortic root and ascending aorta are structurally normal, with no evidence of dilitation. Venous: The inferior vena cava is normal in size with greater than 50% respiratory variability, suggesting right atrial pressure of 3 mmHg. IAS/Shunts: The interatrial septum was not well visualized.  LEFT VENTRICLE PLAX 2D LVIDd:         4.70 cm   Diastology LVIDs:         2.90  cm   LV e' medial:    10.00 cm/s LV PW:         1.00 cm   LV E/e' medial:  9.6 LV IVS:        0.80 cm   LV e' lateral:   13.10 cm/s LVOT diam:     1.70 cm   LV E/e' lateral: 7.3 LV SV:         48 LV SV Index:   25 LVOT Area:     2.27 cm  RIGHT VENTRICLE             IVC RV S prime:     13.60 cm/s  IVC diam: 1.70 cm TAPSE (M-mode): 2.6 cm LEFT ATRIUM             Index LA diam:        3.30 cm 1.72 cm/m LA Vol (A2C):   37.9 ml 19.70 ml/m LA Vol (A4C):   43.8 ml 22.77 ml/m LA Biplane Vol: 42.9 ml 22.30 ml/m  AORTIC VALVE LVOT Vmax:   117.00 cm/s LVOT Vmean:  74.000 cm/s LVOT VTI:    0.211 m  AORTA Ao Root diam: 3.00 cm Ao Asc diam:  3.00 cm MITRAL VALVE                TRICUSPID VALVE MV Area (PHT): 3.37 cm     TR Peak grad:   33.4 mmHg MV Decel Time: 225 msec     TR Vmax:        289.00 cm/s MV E velocity: 95.50 cm/s MV A velocity: 108.00 cm/s  SHUNTS MV E/A ratio:  0.88         Systemic VTI:  0.21 m                             Systemic Diam: 1.70 cm Phineas Inches Electronically signed by Phineas Inches Signature Date/Time: 11/10/2021/6:08:11 PM    Final    CT T-SPINE  NO CHARGE  Result Date: 11/09/2021 CLINICAL DATA:  Fall EXAM: CT Thoracic and Lumbar spine without contrast TECHNIQUE: Multiplanar CT images of the thoracic and lumbar spine were reconstructed from contemporary CT of the Chest, Abdomen, and Pelvis. RADIATION DOSE REDUCTION: This exam was performed according to the departmental dose-optimization program which includes automated exposure control, adjustment of the mA and/or kV according to patient size and/or use of iterative reconstruction technique. COMPARISON:  Same day chest radiograph, two-view chest radiograph 05/20/2019, CT abdomen/pelvis from 2014 FINDINGS: CT THORACIC SPINE FINDINGS Alignment: Normal. Vertebrae: There is an acute compression fracture of the T12 vertebral body with up to approximately 50% loss of vertebral body height centrally and mild bony retropulsion measuring approximately 4  mm. There is no extension into the posterior elements. The other vertebral body heights are preserved, without other acute fracture. There is no suspicious osseous lesion. Paraspinal and other soft tissues: The paraspinal soft tissues are unremarkable. The heart and lungs assessed on the separately dictated CT chest. Disc levels: There is mild multilevel degenerative endplate change and facet arthropathy throughout the thoracic spine. There is no evidence of significant spinal canal or neural foraminal stenosis. CT LUMBAR SPINE FINDINGS Segmentation: Standard; the lowest formed disc space is designated L5-S1. Alignment: Normal. Vertebrae: Lumbar vertebral body heights are preserved. There is no evidence of acute fracture. There is no suspicious osseous lesion. Paraspinal and other soft tissues: The paraspinal soft tissues are unremarkable. The abdominal and pelvic viscera are assessed on the separately dictated CT abdomen/pelvis. Disc levels: There is disc space narrowing most advanced at L5-S1. There is overall mild multilevel facet arthropathy, also most advanced at L5-S1. Findings result in severe right worse than left neural foraminal stenosis at this level. There is no significant neural foraminal stenosis at the other levels. There is no significant spinal canal stenosis. IMPRESSION: 1. Acute compression fracture of the T12 vertebral body with up to approximately 50% loss of vertebral body height centrally and mild bony retropulsion but no significant spinal canal stenosis. 2. No acute fracture or traumatic malalignment of the lumbar spine. 3. Degenerative changes most advanced at L5-S1 resulting in severe bilateral neural foraminal stenosis. Electronically Signed   By: Valetta Mole M.D.   On: 11/09/2021 15:39   CT L-SPINE NO CHARGE  Result Date: 11/09/2021 CLINICAL DATA:  Fall EXAM: CT Thoracic and Lumbar spine without contrast TECHNIQUE: Multiplanar CT images of the thoracic and lumbar spine were  reconstructed from contemporary CT of the Chest, Abdomen, and Pelvis. RADIATION DOSE REDUCTION: This exam was performed according to the departmental dose-optimization program which includes automated exposure control, adjustment of the mA and/or kV according to patient size and/or use of iterative reconstruction technique. COMPARISON:  Same day chest radiograph, two-view chest radiograph 05/20/2019, CT abdomen/pelvis from 2014 FINDINGS: CT THORACIC SPINE FINDINGS Alignment: Normal. Vertebrae: There is an acute compression fracture of the T12 vertebral body with up to approximately 50% loss of vertebral body height centrally and mild bony retropulsion measuring approximately 4 mm. There is no extension into the posterior elements. The other vertebral body heights are preserved, without other acute fracture. There is no suspicious osseous lesion. Paraspinal and other soft tissues: The paraspinal soft tissues are unremarkable. The heart and lungs assessed on the separately dictated CT chest. Disc levels: There is mild multilevel degenerative endplate change and facet arthropathy throughout the thoracic spine. There is no evidence of significant spinal canal or neural foraminal stenosis. CT LUMBAR SPINE FINDINGS Segmentation: Standard; the lowest formed  disc space is designated L5-S1. Alignment: Normal. Vertebrae: Lumbar vertebral body heights are preserved. There is no evidence of acute fracture. There is no suspicious osseous lesion. Paraspinal and other soft tissues: The paraspinal soft tissues are unremarkable. The abdominal and pelvic viscera are assessed on the separately dictated CT abdomen/pelvis. Disc levels: There is disc space narrowing most advanced at L5-S1. There is overall mild multilevel facet arthropathy, also most advanced at L5-S1. Findings result in severe right worse than left neural foraminal stenosis at this level. There is no significant neural foraminal stenosis at the other levels. There is no  significant spinal canal stenosis. IMPRESSION: 1. Acute compression fracture of the T12 vertebral body with up to approximately 50% loss of vertebral body height centrally and mild bony retropulsion but no significant spinal canal stenosis. 2. No acute fracture or traumatic malalignment of the lumbar spine. 3. Degenerative changes most advanced at L5-S1 resulting in severe bilateral neural foraminal stenosis. Electronically Signed   By: Valetta Mole M.D.   On: 11/09/2021 15:39   CT HEAD WO CONTRAST  Result Date: 11/09/2021 CLINICAL DATA:  Provided history: Facial trauma, blunt. Head trauma, moderate/severe. Polytrauma, blunt. EXAM: CT HEAD WITHOUT CONTRAST CT MAXILLOFACIAL WITHOUT CONTRAST CT CERVICAL SPINE WITHOUT CONTRAST TECHNIQUE: Multidetector CT imaging of the head, cervical spine, and maxillofacial structures were performed using the standard protocol without intravenous contrast. Multiplanar CT image reconstructions of the cervical spine and maxillofacial structures were also generated. RADIATION DOSE REDUCTION: This exam was performed according to the departmental dose-optimization program which includes automated exposure control, adjustment of the mA and/or kV according to patient size and/or use of iterative reconstruction technique. COMPARISON:  Brain MRI 04/16/2017. Head CT 04/16/2017. Cervical spine MRI 01/21/2020. FINDINGS: CT HEAD FINDINGS Brain: Mild generalized cerebral atrophy. Minimal patchy and ill-defined hypoattenuation within the cerebral white matter, nonspecific but compatible with chronic small vessel ischemic disease. There is no acute intracranial hemorrhage. No demarcated cortical infarct. No extra-axial fluid collection. No evidence of an intracranial mass. No midline shift. Vascular: No hyperdense vessel. Atherosclerotic calcifications. Skull: No fracture or aggressive osseous lesion. Other: Anterior scalp/forehead hematoma (predominantly on the right). CT MAXILLOFACIAL FINDINGS  Osseous: Acute, displaced fractures of the bilateral nasal bones and frontal process of right maxilla. No other acute maxillofacial fracture is identified. Orbits: Right periorbital hematoma. Sinuses: Small fluid level within the left maxillary sinus. Soft tissues: Right forehead and periorbital hematoma. Soft tissue swelling about the nose. CT CERVICAL SPINE FINDINGS Alignment: No significant spondylolisthesis. Skull base and vertebrae: The basion-dental and atlanto-dental intervals are maintained.No evidence of acute fracture to the cervical spine. Soft tissues and spinal canal: No prevertebral fluid or swelling. No visible canal hematoma. Disc levels: Cervical spondylosis with multilevel disc space narrowing and disc bulges/central disc protrusions. No appreciable high-grade spinal canal stenosis. No significant bony neural foraminal narrowing. Small ventral osteophyte at C4-C5. Upper chest: No consolidation within the imaged lung apices. No visible pneumothorax. Other: 4 mm nodule within the right thyroid lobe, not meeting consensus criteria for ultrasound follow-up based on size. No follow-up imaging is recommended. Reference: J Am Coll Radiol. 2015 Feb;12(2): 143-50. IMPRESSION: CT head: 1. No evidence of acute intracranial abnormality. 2. Minimal chronic small vessel ischemic changes within the cerebral white matter. 3. Mild generalized cerebral atrophy. 4. Anterior scalp/forehead hematoma (predominantly on the right). CT maxillofacial: 1. Acute, displaced fractures of the bilateral nasal bones and frontal process of right maxilla. Soft tissue swelling about the nose. 2. Right forehead and periorbital hematoma. 3.  Small fluid level within the left maxillary sinus. CT cervical spine: 1. No evidence of acute fracture to the cervical spine. 2. Cervical spondylosis, as described. Electronically Signed   By: Kellie Simmering D.O.   On: 11/09/2021 15:18   CT MAXILLOFACIAL WO CONTRAST  Result Date:  11/09/2021 CLINICAL DATA:  Provided history: Facial trauma, blunt. Head trauma, moderate/severe. Polytrauma, blunt. EXAM: CT HEAD WITHOUT CONTRAST CT MAXILLOFACIAL WITHOUT CONTRAST CT CERVICAL SPINE WITHOUT CONTRAST TECHNIQUE: Multidetector CT imaging of the head, cervical spine, and maxillofacial structures were performed using the standard protocol without intravenous contrast. Multiplanar CT image reconstructions of the cervical spine and maxillofacial structures were also generated. RADIATION DOSE REDUCTION: This exam was performed according to the departmental dose-optimization program which includes automated exposure control, adjustment of the mA and/or kV according to patient size and/or use of iterative reconstruction technique. COMPARISON:  Brain MRI 04/16/2017. Head CT 04/16/2017. Cervical spine MRI 01/21/2020. FINDINGS: CT HEAD FINDINGS Brain: Mild generalized cerebral atrophy. Minimal patchy and ill-defined hypoattenuation within the cerebral white matter, nonspecific but compatible with chronic small vessel ischemic disease. There is no acute intracranial hemorrhage. No demarcated cortical infarct. No extra-axial fluid collection. No evidence of an intracranial mass. No midline shift. Vascular: No hyperdense vessel. Atherosclerotic calcifications. Skull: No fracture or aggressive osseous lesion. Other: Anterior scalp/forehead hematoma (predominantly on the right). CT MAXILLOFACIAL FINDINGS Osseous: Acute, displaced fractures of the bilateral nasal bones and frontal process of right maxilla. No other acute maxillofacial fracture is identified. Orbits: Right periorbital hematoma. Sinuses: Small fluid level within the left maxillary sinus. Soft tissues: Right forehead and periorbital hematoma. Soft tissue swelling about the nose. CT CERVICAL SPINE FINDINGS Alignment: No significant spondylolisthesis. Skull base and vertebrae: The basion-dental and atlanto-dental intervals are maintained.No evidence of  acute fracture to the cervical spine. Soft tissues and spinal canal: No prevertebral fluid or swelling. No visible canal hematoma. Disc levels: Cervical spondylosis with multilevel disc space narrowing and disc bulges/central disc protrusions. No appreciable high-grade spinal canal stenosis. No significant bony neural foraminal narrowing. Small ventral osteophyte at C4-C5. Upper chest: No consolidation within the imaged lung apices. No visible pneumothorax. Other: 4 mm nodule within the right thyroid lobe, not meeting consensus criteria for ultrasound follow-up based on size. No follow-up imaging is recommended. Reference: J Am Coll Radiol. 2015 Feb;12(2): 143-50. IMPRESSION: CT head: 1. No evidence of acute intracranial abnormality. 2. Minimal chronic small vessel ischemic changes within the cerebral white matter. 3. Mild generalized cerebral atrophy. 4. Anterior scalp/forehead hematoma (predominantly on the right). CT maxillofacial: 1. Acute, displaced fractures of the bilateral nasal bones and frontal process of right maxilla. Soft tissue swelling about the nose. 2. Right forehead and periorbital hematoma. 3. Small fluid level within the left maxillary sinus. CT cervical spine: 1. No evidence of acute fracture to the cervical spine. 2. Cervical spondylosis, as described. Electronically Signed   By: Kellie Simmering D.O.   On: 11/09/2021 15:18   CT CERVICAL SPINE WO CONTRAST  Result Date: 11/09/2021 CLINICAL DATA:  Provided history: Facial trauma, blunt. Head trauma, moderate/severe. Polytrauma, blunt. EXAM: CT HEAD WITHOUT CONTRAST CT MAXILLOFACIAL WITHOUT CONTRAST CT CERVICAL SPINE WITHOUT CONTRAST TECHNIQUE: Multidetector CT imaging of the head, cervical spine, and maxillofacial structures were performed using the standard protocol without intravenous contrast. Multiplanar CT image reconstructions of the cervical spine and maxillofacial structures were also generated. RADIATION DOSE REDUCTION: This exam was  performed according to the departmental dose-optimization program which includes automated exposure control, adjustment of the mA  and/or kV according to patient size and/or use of iterative reconstruction technique. COMPARISON:  Brain MRI 04/16/2017. Head CT 04/16/2017. Cervical spine MRI 01/21/2020. FINDINGS: CT HEAD FINDINGS Brain: Mild generalized cerebral atrophy. Minimal patchy and ill-defined hypoattenuation within the cerebral white matter, nonspecific but compatible with chronic small vessel ischemic disease. There is no acute intracranial hemorrhage. No demarcated cortical infarct. No extra-axial fluid collection. No evidence of an intracranial mass. No midline shift. Vascular: No hyperdense vessel. Atherosclerotic calcifications. Skull: No fracture or aggressive osseous lesion. Other: Anterior scalp/forehead hematoma (predominantly on the right). CT MAXILLOFACIAL FINDINGS Osseous: Acute, displaced fractures of the bilateral nasal bones and frontal process of right maxilla. No other acute maxillofacial fracture is identified. Orbits: Right periorbital hematoma. Sinuses: Small fluid level within the left maxillary sinus. Soft tissues: Right forehead and periorbital hematoma. Soft tissue swelling about the nose. CT CERVICAL SPINE FINDINGS Alignment: No significant spondylolisthesis. Skull base and vertebrae: The basion-dental and atlanto-dental intervals are maintained.No evidence of acute fracture to the cervical spine. Soft tissues and spinal canal: No prevertebral fluid or swelling. No visible canal hematoma. Disc levels: Cervical spondylosis with multilevel disc space narrowing and disc bulges/central disc protrusions. No appreciable high-grade spinal canal stenosis. No significant bony neural foraminal narrowing. Small ventral osteophyte at C4-C5. Upper chest: No consolidation within the imaged lung apices. No visible pneumothorax. Other: 4 mm nodule within the right thyroid lobe, not meeting consensus  criteria for ultrasound follow-up based on size. No follow-up imaging is recommended. Reference: J Am Coll Radiol. 2015 Feb;12(2): 143-50. IMPRESSION: CT head: 1. No evidence of acute intracranial abnormality. 2. Minimal chronic small vessel ischemic changes within the cerebral white matter. 3. Mild generalized cerebral atrophy. 4. Anterior scalp/forehead hematoma (predominantly on the right). CT maxillofacial: 1. Acute, displaced fractures of the bilateral nasal bones and frontal process of right maxilla. Soft tissue swelling about the nose. 2. Right forehead and periorbital hematoma. 3. Small fluid level within the left maxillary sinus. CT cervical spine: 1. No evidence of acute fracture to the cervical spine. 2. Cervical spondylosis, as described. Electronically Signed   By: Kellie Simmering D.O.   On: 11/09/2021 15:18   CT CHEST ABDOMEN PELVIS WO CONTRAST  Result Date: 11/09/2021 CLINICAL DATA:  Fall with back and neck pain. EXAM: CT CHEST, ABDOMEN AND PELVIS WITHOUT CONTRAST TECHNIQUE: Multidetector CT imaging of the chest, abdomen and pelvis was performed following the standard protocol without IV contrast. RADIATION DOSE REDUCTION: This exam was performed according to the departmental dose-optimization program which includes automated exposure control, adjustment of the mA and/or kV according to patient size and/or use of iterative reconstruction technique. COMPARISON:  Same-day chest and pelvis radiographs, two-view chest radiograph 05/20/2019, CT abdomen/pelvis 02/22/2012 FINDINGS: CT CHEST FINDINGS Cardiovascular: The heart size is normal. There is no pericardial effusion. Mitral annular calcifications and mild aortic valve calcifications are noted. Minimal coronary artery calcifications are seen. There is minimal calcified plaque in the otherwise normal thoracic aorta. Mediastinum/Nodes: The thyroid is unremarkable. There is layering fluid in the esophagus. The esophagus is otherwise grossly  unremarkable. There is no mediastinal or axillary lymphadenopathy. There is no bulky hilar adenopathy, within the confines of noncontrast technique. There is no mediastinal hematoma. Lungs/Pleura: The trachea and central airways are patent. Opacities in the dependent lower lobes most likely reflect subsegmental atelectasis. There are patchy ground-glass opacities in both upper lobes and more notably in the right middle lobe. There are no suspicious nodules. Musculoskeletal: There is no rib or sternal fracture. There  is an acute compression fracture of the T12 vertebral body with bony retropulsion, assessed on the separately dictated thoracic spine CT. CT ABDOMEN PELVIS FINDINGS Hepatobiliary: The liver and gallbladder are unremarkable. There is no evidence of traumatic parenchymal injury. There is no biliary ductal dilatation. Pancreas: Unremarkable. No evidence of traumatic parenchymal injury. Spleen: Unremarkable.  No evidence of traumatic parenchymal injury. Adrenals/Urinary Tract: The adrenals are unremarkable. The kidneys are unremarkable, with no focal lesion (within the confines of noncontrast technique), stone, hydronephrosis, hydroureter, or traumatic parenchymal injury. The bladder is unremarkable. Stomach/Bowel: The stomach is unremarkable. There is no evidence of bowel obstruction. There is no abnormal bowel wall thickening or inflammatory change. Vascular/Lymphatic: There is calcified plaque throughout the nonaneurysmal abdominal aorta. There is no abdominopelvic lymphadenopathy. Reproductive: Uterus and adnexa are unremarkable. Other: There is no ascites or free air. Musculoskeletal: There is no ascites or free air. No acute fracture or suspicious osseous lesion is seen. The lumbar spine is assessed in full on the separately dictated CT lumbar spine. IMPRESSION: 1. Acute compression fracture of the T12 vertebral body with mild bony retropulsion. Please also refer to the separately dictated CT of the  thoracic and lumbar spine. 2. Otherwise, no evidence of acute traumatic injury in the chest, abdomen, or pelvis. 3. Patchy ground-glass opacities in the lungs most notably in the right middle lobe may reflect multifocal infection/inflammation or aspiration. 4. Layering fluid in the esophagus may reflect reflux. Electronically Signed   By: Valetta Mole M.D.   On: 11/09/2021 15:08   DG Pelvis Portable  Result Date: 11/09/2021 CLINICAL DATA:  Trauma, fall EXAM: PORTABLE PELVIS 1-2 VIEWS COMPARISON:  None Available. FINDINGS: No displaced fracture or dislocation is seen. Greater trochanter of proximal right femur is not included in its entirety. SI joints are symmetrical. Pubic symphysis is unremarkable. IMPRESSION: No recent fracture or dislocation is seen in the limited AP portable views of pelvis. Electronically Signed   By: Elmer Picker M.D.   On: 11/09/2021 13:57   DG Chest Port 1 View  Result Date: 11/09/2021 CLINICAL DATA:  Trauma, fall EXAM: PORTABLE CHEST 1 VIEW COMPARISON:  09/06/2021 FINDINGS: Transverse diameter of heart is slightly increased. Prominence of central pulmonary vessels may be due to supine position. There are no signs of pulmonary edema or focal consolidation. There is no significant pleural effusion or pneumothorax. IMPRESSION: No active disease. Electronically Signed   By: Elmer Picker M.D.   On: 11/09/2021 13:52    Microbiology: Results for orders placed or performed during the hospital encounter of 11/09/21  Blood culture (routine x 2)     Status: None   Collection Time: 11/09/21  6:25 PM   Specimen: BLOOD  Result Value Ref Range Status   Specimen Description BLOOD RIGHT ANTECUBITAL  Final   Special Requests   Final    BOTTLES DRAWN AEROBIC AND ANAEROBIC Blood Culture results may not be optimal due to an inadequate volume of blood received in culture bottles   Culture   Final    NO GROWTH 5 DAYS Performed at [facility], [place] [address]  [place], [place] [address]    Report Status 11/14/2021 FINAL  Final    Labs: CBC: Recent Labs  Lab 11/09/21 1330 11/09/21 1337 11/10/21 0635 11/11/21 0101  WBC 15.8*  --  15.4* 11.0*  HGB 12.6 13.3 11.3* 11.8*  HCT 38.5 39.0 33.3* 34.6*  MCV 89.1  --  88.3 88.3  PLT 253  --  223 222  Basic Metabolic Panel: Recent Labs  Lab 11/10/21 0635 11/10/21 0645 11/10/21 1631 11/12/21 0957 11/13/21 0043  NA 130* 130* 133* 133* 132*  K 3.1* 3.3* 3.9 4.0 4.3  CL 97* 97* 98 101 95*  CO2 '24 23 25 26 29  '$ GLUCOSE 109* 104* 100* 114* 121*  BUN '13 14 10 11 14  '$ CREATININE 1.23* 1.26* 1.12* 1.09* 1.22*  CALCIUM 8.7* 8.7* 9.0 8.7* 9.0   Liver Function Tests: Recent Labs  Lab 11/08/21 1416 11/10/21 0645  AST 27 28  ALT 20 20  ALKPHOS 85 60  BILITOT 0.4 1.0  PROT 7.5 6.0*  ALBUMIN 4.7 3.3*   CBG: Recent Labs  Lab 11/13/21 0545 11/13/21 1115 11/13/21 1631 11/13/21 2119 11/14/21 0600  GLUCAP 112* 99 96 109* 118*    Discharge time spent: greater than 30 minutes.  Signed: Oswald Hillock, MD [facility] 11/14/2021

## 2021-11-14 NOTE — Consult Note (Signed)
   [facility] Inpatient Consult   11/14/2021  Diana Hendrix May 26, 1945 [id number]   [facility] [ACO] Patient: Medicare ACO REACH  Primary Care Provider:  Rochel Brome, MD with [facility]  Chart reviewed for potential [name] and patient is transitioning to a skilled nursing facility currently for [facility] [name].  If the patient goes to a [facility] affiliated facility then, patient can be followed by [facility] PAC RN with traditional Medicare plans.   1420: Met patient and spouse at the bedside, patient is transitioning to SNF.  Plan:   Will alert [facility] PAC RN and can follow for any known or needs for transitional care needs for returning to post facility care or complex disease management.   For questions or referrals, please contact:   Natividad Brood, RN BSN [place]  [phone] business mobile phone Toll free office [phone]  *[place]  [phone] Fax number: [phone] [email or web address]

## 2021-11-14 NOTE — Progress Notes (Signed)
Patient refused CPAP due to nose being sore. Equipment on stand by at bedside.

## 2021-11-14 NOTE — Progress Notes (Signed)
Report given to [facility] @ [facility]. A/o x 4. Admitted for syncope. See note. Pain managed prn. Denies SOB. VSS. Pt adeuqate to d/c to rehab.

## 2021-11-14 NOTE — TOC Transition Note (Addendum)
Transition of Care [facility]) - CM/SW Discharge Note   Patient Details  Name: Diana Hendrix MRN: [id number] Date of Birth: 08-May-1945  [facility] CM/SW Contact:  Bethann Berkshire, [place] Phone Number: 11/14/2021, 12:21 PM   Clinical Narrative:     CSW clarified with [facility] that bed offer is for [place] location, not [place]. CSW notified pt and her spouse; they are agreeable to [facility] location. [facility] [place] can admit today.   Patient will DC to:  [facility] [place] Anticipated DC date: Family notified: 11/14/21 Transport by: Corey Harold   Per MD patient ready for DC to [place]. RN, patient, patient's family, and facility notified of DC. Discharge Summary and FL2 sent to facility. RN to call report prior to discharge ([phone]). DC packet on chart. Ambulance transport requested for patient.   CSW will sign off for now as social work intervention is no longer needed. Please consult [place] again if new needs arise.   Final next level of care: Skilled Nursing Facility Barriers to Discharge: No Barriers Identified   Patient Goals and CMS Choice        Discharge Placement              Patient chooses bed at: [facility] [place] Patient to be transferred to facility by: [place] Name of family member notified: Spouse Patient and family notified of of transfer: 11/14/21  Discharge Plan and Services                                     Social Determinants of Health (SDOH) Interventions     Readmission Risk Interventions     No data to display

## 2021-11-15 ENCOUNTER — Other Ambulatory Visit: Payer: Self-pay | Admitting: *Deleted

## 2021-11-15 DIAGNOSIS — Z23 Encounter for immunization: Secondary | ICD-10-CM | POA: Insufficient documentation

## 2021-11-15 NOTE — Assessment & Plan Note (Signed)
The current medical regimen is effective;  continue present plan and medications. Continue Metoprolol Succinate '25mg'$  daily and Furosemide 20 mg daily, and benicar/hct 20/12.5 mg daily. Continue to monitor bp and pulse. Let us know how they are doing particularly if low and becomes dizzy.

## 2021-11-15 NOTE — Assessment & Plan Note (Signed)
New dx. Education given . Check microalb/creat ratio.

## 2021-11-15 NOTE — Patient Outreach (Signed)
Mrs. Grieves resides in [place] [facility]F. Screening for potential [facility] care coordination services as benefit of insurance plan and PCP.   Spoke with Janett Billow, SNF social worker to make aware writer is following for potential [facility] needs and transition plans.  Will continue to follow.   Marthenia Rolling, MSN, RN,BSN [place] Acute Care Coordinator [phone] (Direct dial)

## 2021-11-16 ENCOUNTER — Encounter (HOSPITAL_BASED_OUTPATIENT_CLINIC_OR_DEPARTMENT_OTHER): Payer: Self-pay | Admitting: Pulmonary Disease

## 2021-11-16 DIAGNOSIS — R262 Difficulty in walking, not elsewhere classified: Secondary | ICD-10-CM | POA: Diagnosis not present

## 2021-11-16 DIAGNOSIS — M810 Age-related osteoporosis without current pathological fracture: Secondary | ICD-10-CM | POA: Diagnosis not present

## 2021-11-16 DIAGNOSIS — N1831 Chronic kidney disease, stage 3a: Secondary | ICD-10-CM | POA: Diagnosis not present

## 2021-11-16 DIAGNOSIS — I5033 Acute on chronic diastolic (congestive) heart failure: Secondary | ICD-10-CM | POA: Diagnosis not present

## 2021-11-22 ENCOUNTER — Other Ambulatory Visit: Payer: Self-pay | Admitting: *Deleted

## 2021-11-22 NOTE — Patient Outreach (Signed)
[facility] Post- Acute Care Coordinator follow up. Screening for potential [facility] care coordination services as benefit of insurance plan and PCP.   Spoke with Hessie Diener [place] [facility] social worker who reports Mrs. Dorman will transition home today with her spouse. States home health will be arranged thru [facility] for PT/OT/RN services. States there are no identifiable [facility] needs at this time.  Marthenia Rolling, MSN, RN,BSN [place] Acute Care Coordinator [phone] (Direct dial)

## 2021-11-24 DIAGNOSIS — Z7951 Long term (current) use of inhaled steroids: Secondary | ICD-10-CM | POA: Diagnosis not present

## 2021-11-24 DIAGNOSIS — I130 Hypertensive heart and chronic kidney disease with heart failure and stage 1 through stage 4 chronic kidney disease, or unspecified chronic kidney disease: Secondary | ICD-10-CM | POA: Diagnosis not present

## 2021-11-24 DIAGNOSIS — E876 Hypokalemia: Secondary | ICD-10-CM | POA: Diagnosis not present

## 2021-11-24 DIAGNOSIS — N183 Chronic kidney disease, stage 3 unspecified: Secondary | ICD-10-CM | POA: Diagnosis not present

## 2021-11-24 DIAGNOSIS — E1122 Type 2 diabetes mellitus with diabetic chronic kidney disease: Secondary | ICD-10-CM | POA: Diagnosis not present

## 2021-11-24 DIAGNOSIS — J9601 Acute respiratory failure with hypoxia: Secondary | ICD-10-CM | POA: Diagnosis not present

## 2021-11-24 DIAGNOSIS — J45909 Unspecified asthma, uncomplicated: Secondary | ICD-10-CM | POA: Diagnosis not present

## 2021-11-24 DIAGNOSIS — M47812 Spondylosis without myelopathy or radiculopathy, cervical region: Secondary | ICD-10-CM | POA: Diagnosis not present

## 2021-11-24 DIAGNOSIS — F419 Anxiety disorder, unspecified: Secondary | ICD-10-CM | POA: Diagnosis not present

## 2021-11-24 DIAGNOSIS — I083 Combined rheumatic disorders of mitral, aortic and tricuspid valves: Secondary | ICD-10-CM | POA: Diagnosis not present

## 2021-11-24 DIAGNOSIS — M47817 Spondylosis without myelopathy or radiculopathy, lumbosacral region: Secondary | ICD-10-CM | POA: Diagnosis not present

## 2021-11-24 DIAGNOSIS — D72829 Elevated white blood cell count, unspecified: Secondary | ICD-10-CM | POA: Diagnosis not present

## 2021-11-24 DIAGNOSIS — I700 Atherosclerosis of aorta: Secondary | ICD-10-CM | POA: Diagnosis not present

## 2021-11-24 DIAGNOSIS — K5909 Other constipation: Secondary | ICD-10-CM | POA: Diagnosis not present

## 2021-11-24 DIAGNOSIS — E782 Mixed hyperlipidemia: Secondary | ICD-10-CM | POA: Diagnosis not present

## 2021-11-24 DIAGNOSIS — G319 Degenerative disease of nervous system, unspecified: Secondary | ICD-10-CM | POA: Diagnosis not present

## 2021-11-24 DIAGNOSIS — M4807 Spinal stenosis, lumbosacral region: Secondary | ICD-10-CM | POA: Diagnosis not present

## 2021-11-24 DIAGNOSIS — M47814 Spondylosis without myelopathy or radiculopathy, thoracic region: Secondary | ICD-10-CM | POA: Diagnosis not present

## 2021-11-24 DIAGNOSIS — I5033 Acute on chronic diastolic (congestive) heart failure: Secondary | ICD-10-CM | POA: Diagnosis not present

## 2021-11-24 DIAGNOSIS — K219 Gastro-esophageal reflux disease without esophagitis: Secondary | ICD-10-CM | POA: Diagnosis not present

## 2021-11-24 DIAGNOSIS — M800AXD Age-related osteoporosis with current pathological fracture, other site, subsequent encounter for fracture with routine healing: Secondary | ICD-10-CM | POA: Diagnosis not present

## 2021-11-24 DIAGNOSIS — F330 Major depressive disorder, recurrent, mild: Secondary | ICD-10-CM | POA: Diagnosis not present

## 2021-11-24 DIAGNOSIS — G4733 Obstructive sleep apnea (adult) (pediatric): Secondary | ICD-10-CM | POA: Diagnosis not present

## 2021-11-24 DIAGNOSIS — M8008XD Age-related osteoporosis with current pathological fracture, vertebra(e), subsequent encounter for fracture with routine healing: Secondary | ICD-10-CM | POA: Diagnosis not present

## 2021-11-29 DIAGNOSIS — F330 Major depressive disorder, recurrent, mild: Secondary | ICD-10-CM | POA: Diagnosis not present

## 2021-11-29 DIAGNOSIS — I130 Hypertensive heart and chronic kidney disease with heart failure and stage 1 through stage 4 chronic kidney disease, or unspecified chronic kidney disease: Secondary | ICD-10-CM | POA: Diagnosis not present

## 2021-11-29 DIAGNOSIS — I5033 Acute on chronic diastolic (congestive) heart failure: Secondary | ICD-10-CM | POA: Diagnosis not present

## 2021-11-29 DIAGNOSIS — N183 Chronic kidney disease, stage 3 unspecified: Secondary | ICD-10-CM | POA: Diagnosis not present

## 2021-11-29 DIAGNOSIS — J9601 Acute respiratory failure with hypoxia: Secondary | ICD-10-CM | POA: Diagnosis not present

## 2021-11-29 DIAGNOSIS — E1122 Type 2 diabetes mellitus with diabetic chronic kidney disease: Secondary | ICD-10-CM | POA: Diagnosis not present

## 2021-11-30 ENCOUNTER — Ambulatory Visit (INDEPENDENT_AMBULATORY_CARE_PROVIDER_SITE_OTHER): Payer: Medicare Other | Admitting: Family Medicine

## 2021-11-30 VITALS — BP 130/68 | HR 70 | Temp 97.4°F | Resp 16 | Ht 62.0 in | Wt 200.6 lb

## 2021-11-30 DIAGNOSIS — G4733 Obstructive sleep apnea (adult) (pediatric): Secondary | ICD-10-CM | POA: Diagnosis not present

## 2021-11-30 DIAGNOSIS — R052 Subacute cough: Secondary | ICD-10-CM

## 2021-11-30 DIAGNOSIS — R55 Syncope and collapse: Secondary | ICD-10-CM | POA: Diagnosis not present

## 2021-11-30 DIAGNOSIS — I5032 Chronic diastolic (congestive) heart failure: Secondary | ICD-10-CM

## 2021-11-30 DIAGNOSIS — M8580 Other specified disorders of bone density and structure, unspecified site: Secondary | ICD-10-CM

## 2021-11-30 DIAGNOSIS — I110 Hypertensive heart disease with heart failure: Secondary | ICD-10-CM

## 2021-11-30 DIAGNOSIS — S22088A Other fracture of T11-T12 vertebra, initial encounter for closed fracture: Secondary | ICD-10-CM | POA: Diagnosis not present

## 2021-11-30 DIAGNOSIS — S022XXA Fracture of nasal bones, initial encounter for closed fracture: Secondary | ICD-10-CM

## 2021-11-30 NOTE — Progress Notes (Unsigned)
Subjective:  Patient ID: Diana Hendrix, female    DOB: 29-Nov-1945  Age: 76 y.o. MRN: [id number]  Chief Complaint  Patient presents with   hosoital followup    HPI Patient is here for a hospital followup from her fall. Admit date:     11/09/2021  Discharge date: 11/14/21   Patient is feeling much better today. She is a 76 year old female with history of OSA, hypertension, diabetes mellitus type 2, hyperlipidemia, diastolic CHF, GERD, vasovagal syncope came to ED with syncopal episode and fall at home. Resulted in a fractured T12 and nasal bone fractures. .  CT Brain: no acute findings.  Telemetry: normal.  Echo: EF 60-65% with grade 1 diastolic dysfunction. WBC up on admission. Treated empirically with rocephin and zithromax for possible pneumonia. Required 2 L/min of oxygen PNC initially. Weaned off prior to discharge.  Blood cultures came back negative and IV antibiotics were discontinued.  Recommended physical therapy and OT at SNF.  HYPERTENSION: Olmesartan/hydrochlorothiazide discontinued. Lasix initially held, but restarted one day prior to discharge. Toprol was continued.  Bowels moving well with miralax and magnesium citrate.  Came home on 11/22/2021. Has had bms every day, but one. Using miralax/dulcolax.  Home health nurse and physical therapy came yesterday.  Coming once a week.     Current Outpatient Medications on File Prior to Visit  Medication Sig Dispense Refill   acetaminophen (TYLENOL) 325 MG tablet Take 2 tablets (650 mg total) by mouth every 6 (six) hours as needed for mild pain (or Fever >/= 101).     albuterol (PROVENTIL) (2.5 MG/3ML) 0.083% nebulizer solution Take 3 mLs (2.5 mg total) by nebulization every 6 (six) hours as needed for wheezing or shortness of breath. 360 mL 5   amitriptyline (ELAVIL) 25 MG tablet TAKE 1 TABLET(25 MG) BY MOUTH AT BEDTIME (Patient taking differently: Take 25 mg by mouth at bedtime.) 90 tablet 1   Cholecalciferol (VITAMIN D3) 50  MCG (2000 UT) TABS Take 1 tablet by mouth daily.     citalopram (CELEXA) 10 MG tablet TAKE 1 TABLET(10 MG) BY MOUTH DAILY (Patient taking differently: Take 10 mg by mouth daily.) 90 tablet 1   fluticasone (FLONASE) 50 MCG/ACT nasal spray Place 1 spray into both nostrils at bedtime. 16 g 2   Fluticasone-Umeclidin-Vilant (TRELEGY ELLIPTA) 100-62.5-25 MCG/ACT AEPB Inhale 1 puff into the lungs daily in the afternoon. 60 each 5   furosemide (LASIX) 20 MG tablet TAKE 1 TABLET(20 MG) BY MOUTH DAILY (Patient taking differently: Take 20 mg by mouth daily.) 90 tablet 1   guaiFENesin (MUCINEX) 600 MG 12 hr tablet Take 1 tablet (600 mg total) by mouth 2 (two) times daily.     metoprolol succinate (TOPROL-XL) 50 MG 24 hr tablet Take 0.5 tablets (25 mg total) by mouth daily. Take with or immediately following a meal. 1 tablet 0   montelukast (SINGULAIR) 10 MG tablet TAKE 1 TABLET(10 MG) BY MOUTH AT BEDTIME (Patient taking differently: Take 10 mg by mouth at bedtime.) 90 tablet 3   NON FORMULARY CPAP at bedtime     omeprazole (PRILOSEC) 40 MG capsule Take 1 capsule (40 mg total) by mouth daily. 90 capsule 3   Polyethyl Glycol-Propyl Glycol (SYSTANE OP) Place 1 drop into both eyes 4 (four) times daily.     polyethylene glycol (MIRALAX / GLYCOLAX) 17 g packet Take 17 g by mouth daily.     rosuvastatin (CRESTOR) 40 MG tablet TAKE 1 TABLET(40 MG) BY MOUTH DAILY (Patient taking differently:  Take 40 mg by mouth daily.) 90 tablet 1   traMADol (ULTRAM) 50 MG tablet Take 1 tablet (50 mg total) by mouth every 12 (twelve) hours as needed for moderate pain. 10 tablet 0   lidocaine (LIDODERM) 5 % Place 1 patch onto the skin daily. Remove & Discard patch within 12 hours or as directed by MD (Patient not taking: Reported on 11/30/2021) 30 patch 0   No current facility-administered medications on file prior to visit.   Past Medical History:  Diagnosis Date   Cancer ([place])    Diastolic heart failure (HCC)    Echo EF 60/65%  Heart monitor was normal   GERD (gastroesophageal reflux disease)    History of colon polyps    Hyperlipidemia    Hypertension    Macular edema, cystoid    OSA (obstructive sleep apnea) 09/17/2012   Osteopenia    Plantar fasciitis 09/09/2019   Past Surgical History:  Procedure Laterality Date   CESAREAN SECTION     COLONOSCOPY  10/29/2013   Colonic polyp status post polypectomy. Mild sigmoid diverticulosis. Small internal hemorrhoids   FOOT NEUROMA SURGERY     s/p surgery in both feet   FOOT SURGERY Bilateral    Per patient   MENISCUS REPAIR Left 09/13/2016   miniscus repair right  Right 2019   SHOULDER SURGERY Left    arthroscopy.    TUBAL LIGATION      Family History  Problem Relation Age of Onset   Hyperlipidemia Mother    Heart disease Mother    Hypertension Mother    Atrial fibrillation Mother    CAD Mother    Congestive Heart Failure Mother    CVA Father    Hypertension Brother    Hypertension Son    Colon cancer Neg Hx    Esophageal cancer Neg Hx    Social History   Socioeconomic History   Marital status: Married    Spouse name: Transport planner   Number of children: 2   Years of education: Not on file   Highest education level: Not on file  Occupational History   Not on file  Tobacco Use   Smoking status: Never   Smokeless tobacco: Never  Vaping Use   Vaping Use: Never used  Substance and Sexual Activity   Alcohol use: No   Drug use: No   Sexual activity: Not Currently  Other Topics Concern   Not on file  Social History Narrative   Lives with husband   Right handed   Caffeine: 1 cup of tea and 1 pepsi in the AM   Social Determinants of Health   Financial Resource Strain: Low Risk  (05/14/2018)   Overall Financial Resource Strain (CARDIA)    Difficulty of Paying Living Expenses: Not hard at all  Food Insecurity: No Food Insecurity (11/10/2021)   Hunger Vital Sign    Worried About Running Out of Food in the Last Year: Never true    Ran Out of Food  in the Last Year: Never true  Transportation Needs: No Transportation Needs (11/10/2021)   PRAPARE - Hydrologist (Medical): No    Lack of Transportation (Non-Medical): No  Physical Activity: Sufficiently Active (05/14/2018)   Exercise Vital Sign    Days of Exercise per Week: 5 days    Minutes of Exercise per Session: 40 min  Stress: Not on file  Social Connections: Socially Integrated (05/14/2018)   Social Connection and Isolation Panel [NHANES]  Frequency of Communication with Friends and Family: More than three times a week    Frequency of Social Gatherings with Friends and Family: More than three times a week    Attends Religious Services: 1 to 4 times per year    Active Member of [employer] or Organizations: Yes    Attends Archivist Meetings: 1 to 4 times per year    Marital Status: Married    Review of Systems  Constitutional:  Negative for chills and fatigue.  HENT:  Positive for congestion. Negative for ear pain, sinus pain and sore throat.   Respiratory:  Positive for cough. Negative for shortness of breath.   Cardiovascular:  Negative for chest pain and leg swelling.  Gastrointestinal:  Positive for abdominal pain and constipation. Negative for diarrhea, nausea and vomiting.  Genitourinary:  Negative for dysuria and frequency.  Musculoskeletal:  Positive for back pain. Negative for arthralgias and myalgias.  Neurological:  Negative for dizziness and headaches.     Objective:  BP 130/68   Pulse 70   Temp (!) 97.4 F (36.3 C)   Resp 16   Ht '5\' 2"'$  (1.575 m)   Wt 200 lb 9.6 oz (91 kg)   SpO2 94%   BMI 36.69 kg/m      11/30/2021   10:55 AM 11/14/2021   12:22 PM 11/14/2021   12:21 PM  BP/Weight  Systolic BP 706 237 628  Diastolic BP 68 85 85  Wt. (Lbs) 200.6    BMI 36.69 kg/m2      Physical Exam Vitals reviewed.  Constitutional:      Appearance: Normal appearance. She is obese.  HENT:     Nose:     Comments: Nose  slightly displaced to her left. Neck:     Vascular: No carotid bruit.  Cardiovascular:     Rate and Rhythm: Normal rate and regular rhythm.     Heart sounds: Normal heart sounds.  Pulmonary:     Effort: Pulmonary effort is normal. No respiratory distress.     Breath sounds: Normal breath sounds.  Musculoskeletal:     Comments: Brace on back.  Neurological:     Mental Status: She is alert and oriented to person, place, and time.  Psychiatric:        Mood and Affect: Mood normal.        Behavior: Behavior normal.     Diabetic Foot Exam - Simple   No data filed      Lab Results  Component Value Date   WBC 7.6 11/30/2021   HGB 13.1 11/30/2021   HCT 39.8 11/30/2021   PLT 323 11/30/2021   GLUCOSE 88 11/30/2021   CHOL 153 10/24/2021   TRIG 157 (H) 10/24/2021   HDL 52 10/24/2021   LDLCALC 74 10/24/2021   ALT 15 11/30/2021   AST 20 11/30/2021   NA 141 11/30/2021   K 4.9 11/30/2021   CL 98 11/30/2021   CREATININE 1.14 (H) 11/30/2021   BUN 12 11/30/2021   CO2 26 11/30/2021   TSH 1.810 02/09/2021   INR 1.1 11/09/2021   HGBA1C 6.5 (H) 10/24/2021      Assessment & Plan:   Problem List Items Addressed This Visit       Cardiovascular and Mediastinum   Hypertensive heart disease with chronic diastolic congestive heart failure (HCC)    Continue lasix and metoprolol xl.       Relevant Orders   CBC with Differential/Platelet (Completed)   Comprehensive metabolic panel (  Completed)   RESOLVED: Chronic diastolic CHF (congestive heart failure) (HCC)     Respiratory   OSA (obstructive sleep apnea)    Continue cpap        Musculoskeletal and Integument   Osteopenia    Order dexa.      Relevant Orders   DG Bone Density   Nasal bone fractures    Healing well. No need to straighten.      Closed fracture of twelfth thoracic vertebra (HCC)   Relevant Orders   DG Bone Density     Other   Syncope, vasovagal    Recommend get up slowly.  Medications adjusted  during hospitalization.      Subacute cough - Primary   Relevant Orders   DG Chest 2 View  .  No orders of the defined types were placed in this encounter.   Orders Placed This Encounter  Procedures   DG Chest 2 View   DG Bone Density   CBC with Differential/Platelet   Comprehensive metabolic panel     Follow-up: No follow-ups on file.  An After Visit Summary was printed and given to the patient.  Rochel Brome, MD [facility] [phone]

## 2021-12-01 ENCOUNTER — Ambulatory Visit: Payer: Medicare Other | Admitting: Dietician

## 2021-12-01 DIAGNOSIS — Z6837 Body mass index (BMI) 37.0-37.9, adult: Secondary | ICD-10-CM | POA: Diagnosis not present

## 2021-12-01 DIAGNOSIS — S22080A Wedge compression fracture of T11-T12 vertebra, initial encounter for closed fracture: Secondary | ICD-10-CM | POA: Diagnosis not present

## 2021-12-01 LAB — CBC WITH DIFFERENTIAL/PLATELET
Basophils Absolute: 0.1 10*3/uL (ref 0.0–0.2)
Basos: 1 %
EOS (ABSOLUTE): 0.3 10*3/uL (ref 0.0–0.4)
Eos: 4 %
Hematocrit: 39.8 % (ref 34.0–46.6)
Hemoglobin: 13.1 g/dL (ref 11.1–15.9)
Immature Grans (Abs): 0 10*3/uL (ref 0.0–0.1)
Immature Granulocytes: 0 %
Lymphocytes Absolute: 2.1 10*3/uL (ref 0.7–3.1)
Lymphs: 27 %
MCH: 28.9 pg (ref 26.6–33.0)
MCHC: 32.9 g/dL (ref 31.5–35.7)
MCV: 88 fL (ref 79–97)
Monocytes Absolute: 0.6 10*3/uL (ref 0.1–0.9)
Monocytes: 8 %
Neutrophils Absolute: 4.5 10*3/uL (ref 1.4–7.0)
Neutrophils: 60 %
Platelets: 323 10*3/uL (ref 150–450)
RBC: 4.54 x10E6/uL (ref 3.77–5.28)
RDW: 13.6 % (ref 11.7–15.4)
WBC: 7.6 10*3/uL (ref 3.4–10.8)

## 2021-12-01 LAB — COMPREHENSIVE METABOLIC PANEL
ALT: 15 IU/L (ref 0–32)
AST: 20 IU/L (ref 0–40)
Albumin/Globulin Ratio: 1.5 (ref 1.2–2.2)
Albumin: 4.6 g/dL (ref 3.8–4.8)
Alkaline Phosphatase: 135 IU/L — ABNORMAL HIGH (ref 44–121)
BUN/Creatinine Ratio: 11 — ABNORMAL LOW (ref 12–28)
BUN: 12 mg/dL (ref 8–27)
Bilirubin Total: 0.4 mg/dL (ref 0.0–1.2)
CO2: 26 mmol/L (ref 20–29)
Calcium: 10.1 mg/dL (ref 8.7–10.3)
Chloride: 98 mmol/L (ref 96–106)
Creatinine, Ser: 1.14 mg/dL — ABNORMAL HIGH (ref 0.57–1.00)
Globulin, Total: 3 g/dL (ref 1.5–4.5)
Glucose: 88 mg/dL (ref 70–99)
Potassium: 4.9 mmol/L (ref 3.5–5.2)
Sodium: 141 mmol/L (ref 134–144)
Total Protein: 7.6 g/dL (ref 6.0–8.5)
eGFR: 50 mL/min/{1.73_m2} — ABNORMAL LOW (ref >59)

## 2021-12-02 ENCOUNTER — Other Ambulatory Visit: Payer: Self-pay

## 2021-12-02 MED ORDER — AMOXICILLIN 875 MG PO TABS: 875.0000 mg | ORAL_TABLET | Freq: Two times a day (BID) | ORAL | 0 refills | Status: DC

## 2021-12-05 DIAGNOSIS — J9601 Acute respiratory failure with hypoxia: Secondary | ICD-10-CM | POA: Diagnosis not present

## 2021-12-05 DIAGNOSIS — I130 Hypertensive heart and chronic kidney disease with heart failure and stage 1 through stage 4 chronic kidney disease, or unspecified chronic kidney disease: Secondary | ICD-10-CM | POA: Diagnosis not present

## 2021-12-05 DIAGNOSIS — N183 Chronic kidney disease, stage 3 unspecified: Secondary | ICD-10-CM | POA: Diagnosis not present

## 2021-12-05 DIAGNOSIS — E1122 Type 2 diabetes mellitus with diabetic chronic kidney disease: Secondary | ICD-10-CM | POA: Diagnosis not present

## 2021-12-05 DIAGNOSIS — I5033 Acute on chronic diastolic (congestive) heart failure: Secondary | ICD-10-CM | POA: Diagnosis not present

## 2021-12-05 DIAGNOSIS — F330 Major depressive disorder, recurrent, mild: Secondary | ICD-10-CM | POA: Diagnosis not present

## 2021-12-06 ENCOUNTER — Encounter: Payer: Self-pay | Admitting: Family Medicine

## 2021-12-06 DIAGNOSIS — S22089A Unspecified fracture of T11-T12 vertebra, initial encounter for closed fracture: Secondary | ICD-10-CM | POA: Insufficient documentation

## 2021-12-06 DIAGNOSIS — R052 Subacute cough: Secondary | ICD-10-CM | POA: Insufficient documentation

## 2021-12-06 NOTE — Assessment & Plan Note (Signed)
Management per specialist. 

## 2021-12-06 NOTE — Assessment & Plan Note (Signed)
Recommend get up slowly.  Medications adjusted during hospitalization.

## 2021-12-06 NOTE — Assessment & Plan Note (Signed)
Continue cpap.  

## 2021-12-06 NOTE — Assessment & Plan Note (Signed)
Continue lasix and metoprolol xl.

## 2021-12-06 NOTE — Assessment & Plan Note (Signed)
Order dexa.

## 2021-12-06 NOTE — Assessment & Plan Note (Signed)
Healing well. No need to straighten.

## 2021-12-07 DIAGNOSIS — F330 Major depressive disorder, recurrent, mild: Secondary | ICD-10-CM | POA: Diagnosis not present

## 2021-12-07 DIAGNOSIS — J9601 Acute respiratory failure with hypoxia: Secondary | ICD-10-CM | POA: Diagnosis not present

## 2021-12-07 DIAGNOSIS — E1122 Type 2 diabetes mellitus with diabetic chronic kidney disease: Secondary | ICD-10-CM | POA: Diagnosis not present

## 2021-12-07 DIAGNOSIS — N183 Chronic kidney disease, stage 3 unspecified: Secondary | ICD-10-CM | POA: Diagnosis not present

## 2021-12-07 DIAGNOSIS — I130 Hypertensive heart and chronic kidney disease with heart failure and stage 1 through stage 4 chronic kidney disease, or unspecified chronic kidney disease: Secondary | ICD-10-CM | POA: Diagnosis not present

## 2021-12-07 DIAGNOSIS — I5033 Acute on chronic diastolic (congestive) heart failure: Secondary | ICD-10-CM | POA: Diagnosis not present

## 2021-12-09 ENCOUNTER — Encounter: Payer: Self-pay | Admitting: Family Medicine

## 2021-12-12 ENCOUNTER — Encounter: Payer: Self-pay | Admitting: Family Medicine

## 2021-12-13 DIAGNOSIS — E1122 Type 2 diabetes mellitus with diabetic chronic kidney disease: Secondary | ICD-10-CM | POA: Diagnosis not present

## 2021-12-13 DIAGNOSIS — F330 Major depressive disorder, recurrent, mild: Secondary | ICD-10-CM | POA: Diagnosis not present

## 2021-12-13 DIAGNOSIS — J9601 Acute respiratory failure with hypoxia: Secondary | ICD-10-CM | POA: Diagnosis not present

## 2021-12-13 DIAGNOSIS — I5033 Acute on chronic diastolic (congestive) heart failure: Secondary | ICD-10-CM | POA: Diagnosis not present

## 2021-12-13 DIAGNOSIS — N183 Chronic kidney disease, stage 3 unspecified: Secondary | ICD-10-CM | POA: Diagnosis not present

## 2021-12-13 DIAGNOSIS — I130 Hypertensive heart and chronic kidney disease with heart failure and stage 1 through stage 4 chronic kidney disease, or unspecified chronic kidney disease: Secondary | ICD-10-CM | POA: Diagnosis not present

## 2021-12-14 DIAGNOSIS — J9601 Acute respiratory failure with hypoxia: Secondary | ICD-10-CM | POA: Diagnosis not present

## 2021-12-14 DIAGNOSIS — I130 Hypertensive heart and chronic kidney disease with heart failure and stage 1 through stage 4 chronic kidney disease, or unspecified chronic kidney disease: Secondary | ICD-10-CM | POA: Diagnosis not present

## 2021-12-14 DIAGNOSIS — I5033 Acute on chronic diastolic (congestive) heart failure: Secondary | ICD-10-CM | POA: Diagnosis not present

## 2021-12-14 DIAGNOSIS — N183 Chronic kidney disease, stage 3 unspecified: Secondary | ICD-10-CM | POA: Diagnosis not present

## 2021-12-14 DIAGNOSIS — E1122 Type 2 diabetes mellitus with diabetic chronic kidney disease: Secondary | ICD-10-CM | POA: Diagnosis not present

## 2021-12-14 DIAGNOSIS — F330 Major depressive disorder, recurrent, mild: Secondary | ICD-10-CM | POA: Diagnosis not present

## 2021-12-20 ENCOUNTER — Other Ambulatory Visit: Payer: Self-pay

## 2021-12-20 DIAGNOSIS — I5033 Acute on chronic diastolic (congestive) heart failure: Secondary | ICD-10-CM | POA: Diagnosis not present

## 2021-12-20 DIAGNOSIS — I130 Hypertensive heart and chronic kidney disease with heart failure and stage 1 through stage 4 chronic kidney disease, or unspecified chronic kidney disease: Secondary | ICD-10-CM | POA: Diagnosis not present

## 2021-12-20 DIAGNOSIS — I110 Hypertensive heart disease with heart failure: Secondary | ICD-10-CM

## 2021-12-20 DIAGNOSIS — E1122 Type 2 diabetes mellitus with diabetic chronic kidney disease: Secondary | ICD-10-CM | POA: Diagnosis not present

## 2021-12-20 MED ORDER — METOPROLOL SUCCINATE ER 50 MG PO TB24: 25.0000 mg | ORAL_TABLET | Freq: Every day | ORAL | 0 refills | Status: DC

## 2021-12-22 ENCOUNTER — Other Ambulatory Visit: Payer: Self-pay

## 2021-12-22 DIAGNOSIS — R052 Subacute cough: Secondary | ICD-10-CM

## 2022-01-04 DIAGNOSIS — M545 Low back pain, unspecified: Secondary | ICD-10-CM | POA: Diagnosis not present

## 2022-01-04 DIAGNOSIS — M546 Pain in thoracic spine: Secondary | ICD-10-CM | POA: Diagnosis not present

## 2022-01-04 DIAGNOSIS — M4854XD Collapsed vertebra, not elsewhere classified, thoracic region, subsequent encounter for fracture with routine healing: Secondary | ICD-10-CM | POA: Diagnosis not present

## 2022-01-17 NOTE — Progress Notes (Unsigned)
Cardiology Office Note:   She had a very profound episode of syncope that I think is more than typical vasovagal. Date:  01/18/2022   ID:  Diana Hendrix, DOB September 19, 1945, MRN [id number]  PCP:  Rochel Brome, MD  Cardiologist:  Shirlee More, MD    Referring MD: Rochel Brome, MD    ASSESSMENT:    1. Syncope, vasovagal   2. Hypertensive heart disease with chronic diastolic congestive heart failure ([place])   3. Mixed hyperlipidemia   4. OSA (obstructive sleep apnea)   5. Precordial chest pain    PLAN:    In order of problems listed above:  I suspect it was a combination of her antihypertensive agents and a tricyclic antidepressant which can have a profound alpha adrenergic blockade effect with orthostatic drop in blood pressure.  I told her I think she should stop her Elavil reduce her loop diuretic discontinue the thiazide diuretic and place her on a low-dose of a low potency ARB with goal systolic blood pressure of 1 30-1 40 encouraged her to purchase a new validated blood pressure [employer] device and good technique for checking at home.  She has an Visual merchandiser and I will give her instructions on reprogramming for high and low rate alerts and EKGs and atrial fibrillation detection.  Her standing blood pressure in the office today is 98 systolic supporting the diagnosis of orthostatic hypotension leading to syncope She clearly has hypertensive heart disease I do not think she has what I would call congestive heart failure will reduce her diuretic in the future perhaps discontinue. Continue her current lipid-lowering therapy Stable being managed by pulmonary along with her chronic cough and lung disease She certainly is at risk for coronary disease her symptoms are bothersome she has a chronic chest pain pattern and I think we should evaluate this in view of the unusual nature of her syncopal episode and she will be set up for cardiac CTA.   Next appointment: 3 months   Medication  Adjustments/Labs and Tests Ordered: Current medicines are reviewed at length with the patient today.  Concerns regarding medicines are outlined above.  No orders of the defined types were placed in this encounter.  No orders of the defined types were placed in this encounter.  Chief complaint I was admitted to the hospital when I fainted fractured my spine told I have congestive heart failure but I am very worried about the chest pain that is occurring   History of Present Illness:    Diana Hendrix is a 77 y.o. female with a hx of hypertensive heart disease obstructive sleep apnea with CPAP therapy shortness of breath and edema last seen 03/04/2020.  After reviewing her echocardiogram with grade 1 diastolic dysfunction normal ejection fraction and normal proBNP level I felt that her edema was not due to heart failure.  She was admitted to [facility] 11/09/2021 discharged 11/14/2021 with acute respiratory failure and syncope.  Her discharge diagnosis was vasovagal syncope T12 compression fracture nasal x-ray.  Type 2 diabetes hypertension stage III CKD and given a discharge diagnosis of heart failure.  She had a chest x-ray 12/30/2021 showing pneumonia.  Chest x-ray 11/09/2021 did not show findings of heart failure.  She did not have a BNP level performed.  She had another echocardiogram performed 11/10/2021 again showing normal left ventricular systolic function grade 1 diastolic dysfunction usually not seen with heart failure normal right ventricular size and function.  Compliance with diet, lifestyle and medications: Yes  This is a very complicated visit prompted by her recent hospitalization. She had a very abrupt profound syncopal episode with trauma including a nasal fracture and a thoracic compression fracture. Prior to the episode she had a labile blood pressure and was having recordings of less than 100 at times and takes a tricyclic antidepressant for sleep that can cause  orthostatic shift and blood pressure She said she had epistasis and when she was in the hospital and evidence of pneumonia her oxygen was low and did not have a BMP level done and was diagnosed with heart failure and is now on a loop diuretic It has helped her peripheral edema she does not have orthopnea she has had no palpitation she has had no recurrent syncope but she has had episodes of orthostatic lightheadedness preceding this hospitalization Her major concern is she is having chest pain she tells me it is substernal parts of it are positional and brief but she also tells me she has episodes that are prolonged heaviness and she had 1 that was very severe that lasted through the evening off-and-on 1 night and she did not know whether or not to summon help. She is not having exertional angina shortness of breath. She complains bitterly that she still has a feeling that she has residual infection in sputum and intermittent wheezing that has not improved with her bronchodilators She is back on beta-blocker and combination ARB thiazide diuretic She trends blood pressure at home but the device is old   Past Medical History:  Diagnosis Date   Cancer ([place])    Diastolic heart failure ([place])    Echo EF 60/65% Heart monitor was normal   GERD (gastroesophageal reflux disease)    History of colon polyps    Hyperlipidemia    Hypertension    Macular edema, cystoid    OSA (obstructive sleep apnea) 09/17/2012   Osteopenia    Plantar fasciitis 09/09/2019    Past Surgical History:  Procedure Laterality Date   CESAREAN SECTION     COLONOSCOPY  10/29/2013   Colonic polyp status post polypectomy. Mild sigmoid diverticulosis. Small internal hemorrhoids   FOOT NEUROMA SURGERY     s/p surgery in both feet   FOOT SURGERY Bilateral    Per patient   MENISCUS REPAIR Left 09/13/2016   miniscus repair right  Right 2019   SHOULDER SURGERY Left    arthroscopy.    TUBAL LIGATION      Current  Medications: Current Meds  Medication Sig   acetaminophen (TYLENOL) 325 MG tablet Take 2 tablets (650 mg total) by mouth every 6 (six) hours as needed for mild pain (or Fever >/= 101).   amitriptyline (ELAVIL) 25 MG tablet TAKE 1 TABLET(25 MG) BY MOUTH AT BEDTIME (Patient taking differently: Take 25 mg by mouth at bedtime.)   Cholecalciferol (VITAMIN D3) 50 MCG (2000 UT) TABS Take 1 tablet by mouth daily.   citalopram (CELEXA) 10 MG tablet TAKE 1 TABLET(10 MG) BY MOUTH DAILY (Patient taking differently: Take 10 mg by mouth daily.)   furosemide (LASIX) 20 MG tablet TAKE 1 TABLET(20 MG) BY MOUTH DAILY (Patient taking differently: Take 20 mg by mouth daily.)   guaiFENesin (MUCINEX) 600 MG 12 hr tablet Take 1 tablet (600 mg total) by mouth 2 (two) times daily.   metoprolol succinate (TOPROL-XL) 50 MG 24 hr tablet Take 0.5 tablets (25 mg total) by mouth daily. Take with or immediately following a meal.   montelukast (SINGULAIR) 10 MG tablet TAKE 1  TABLET(10 MG) BY MOUTH AT BEDTIME (Patient taking differently: Take 10 mg by mouth at bedtime.)   NON FORMULARY CPAP at bedtime   olmesartan-hydrochlorothiazide (BENICAR HCT) 20-12.5 MG tablet Take by mouth daily. Take 0.5 tablet once daily   omeprazole (PRILOSEC) 40 MG capsule Take 1 capsule (40 mg total) by mouth daily.   Polyethyl Glycol-Propyl Glycol (SYSTANE OP) Place 1 drop into both eyes 4 (four) times daily.   polyethylene glycol (MIRALAX / GLYCOLAX) 17 g packet Take 17 g by mouth daily.   rosuvastatin (CRESTOR) 40 MG tablet TAKE 1 TABLET(40 MG) BY MOUTH DAILY (Patient taking differently: Take 40 mg by mouth daily.)   traMADol (ULTRAM) 50 MG tablet Take 1 tablet (50 mg total) by mouth every 12 (twelve) hours as needed for moderate pain.     Allergies:   Zyrtec [cetirizine]   Social History   Socioeconomic History   Marital status: Married    Spouse name: Transport planner   Number of children: 2   Years of education: Not on file   Highest education  level: Not on file  Occupational History   Not on file  Tobacco Use   Smoking status: Never   Smokeless tobacco: Never  Vaping Use   Vaping Use: Never used  Substance and Sexual Activity   Alcohol use: No   Drug use: No   Sexual activity: Not Currently  Other Topics Concern   Not on file  Social History Narrative   Lives with husband   Right handed   Caffeine: 1 cup of tea and 1 pepsi in the AM   Social Determinants of Health   Financial Resource Strain: Low Risk  (05/14/2018)   Overall Financial Resource Strain (CARDIA)    Difficulty of Paying Living Expenses: Not hard at all  Food Insecurity: No Food Insecurity (11/10/2021)   Hunger Vital Sign    Worried About Running Out of Food in the Last Year: Never true    Ran Out of Food in the Last Year: Never true  Transportation Needs: No Transportation Needs (11/10/2021)   PRAPARE - Hydrologist (Medical): No    Lack of Transportation (Non-Medical): No  Physical Activity: Sufficiently Active (05/14/2018)   Exercise Vital Sign    Days of Exercise per Week: 5 days    Minutes of Exercise per Session: 40 min  Stress: Not on file  Social Connections: Socially Integrated (05/14/2018)   Social Connection and Isolation Panel [NHANES]    Frequency of Communication with Friends and Family: More than three times a week    Frequency of Social Gatherings with Friends and Family: More than three times a week    Attends Religious Services: 1 to 4 times per year    Active Member of [employer] or Organizations: Yes    Attends Archivist Meetings: 1 to 4 times per year    Marital Status: Married     Family History: The patient's family history includes Atrial fibrillation in her mother; CAD in her mother; CVA in her father; Congestive Heart Failure in her mother; Heart disease in her mother; Hyperlipidemia in her mother; Hypertension in her brother, mother, and son. There is no history of Colon cancer or  Esophageal cancer. ROS:   Please see the history of present illness.    All other systems reviewed and are negative.  EKGs/Labs/Other Studies Reviewed:    The following studies were reviewed today:  EKG:  EKG ordered today and personally reviewed.  The ekg ordered today demonstrates sinus rhythm and is normal  Recent Labs: 02/09/2021: TSH 1.810 11/09/2021: B Natriuretic Peptide 32.2 11/30/2021: ALT 15; BUN 12; Creatinine, Ser 1.14; Hemoglobin 13.1; Platelets 323; Potassium 4.9; Sodium 141  Recent Lipid Panel    Component Value Date/Time   CHOL 153 10/24/2021 1057   TRIG 157 (H) 10/24/2021 1057   HDL 52 10/24/2021 1057   CHOLHDL 2.9 10/24/2021 1057   CHOLHDL 4 04/24/2019 0913   VLDL 20.6 04/24/2019 0913   LDLCALC 74 10/24/2021 1057    Physical Exam:    VS:  BP 106/68 (BP Location: Right Arm, Patient Position: Sitting)   Pulse 82   Ht '5\' 2"'$  (1.575 m)   Wt 199 lb (90.3 kg)   SpO2 98%   BMI 36.40 kg/m     Wt Readings from Last 3 Encounters:  01/18/22 199 lb (90.3 kg)  11/30/21 200 lb 9.6 oz (91 kg)  11/14/21 206 lb 2.1 oz (93.5 kg)     GEN: She does not look chronically ill she is not short of breath today well nourished, well developed in no acute distress HEENT: Normal NECK: No JVD; No carotid bruits LYMPHATICS: No lymphadenopathy CARDIAC: RRR, no murmurs, rubs, gallops RESPIRATORY:  Clear to auscultation without rales, wheezing or rhonchi  ABDOMEN: Soft, non-tender, non-distended MUSCULOSKELETAL:  No edema; No deformity  SKIN: Warm and dry NEUROLOGIC:  Alert and oriented x 3 PSYCHIATRIC:  Normal affect    Signed, Shirlee More, MD  01/18/2022 3:47 PM     [facility]

## 2022-01-18 ENCOUNTER — Ambulatory Visit: Payer: Medicare Other | Attending: Cardiology | Admitting: Cardiology

## 2022-01-18 ENCOUNTER — Encounter: Payer: Self-pay | Admitting: Cardiology

## 2022-01-18 VITALS — BP 98/60 | HR 68 | Ht 62.0 in | Wt 199.0 lb

## 2022-01-18 DIAGNOSIS — R55 Syncope and collapse: Secondary | ICD-10-CM

## 2022-01-18 DIAGNOSIS — I110 Hypertensive heart disease with heart failure: Secondary | ICD-10-CM

## 2022-01-18 DIAGNOSIS — R072 Precordial pain: Secondary | ICD-10-CM | POA: Diagnosis not present

## 2022-01-18 DIAGNOSIS — G4733 Obstructive sleep apnea (adult) (pediatric): Secondary | ICD-10-CM

## 2022-01-18 DIAGNOSIS — I5032 Chronic diastolic (congestive) heart failure: Secondary | ICD-10-CM | POA: Insufficient documentation

## 2022-01-18 DIAGNOSIS — E782 Mixed hyperlipidemia: Secondary | ICD-10-CM | POA: Diagnosis not present

## 2022-01-18 MED ORDER — LOSARTAN POTASSIUM 50 MG PO TABS: 50.0000 mg | ORAL_TABLET | Freq: Every day | ORAL | 3 refills | Status: DC

## 2022-01-18 MED ORDER — METOPROLOL TARTRATE 50 MG PO TABS: 50.0000 mg | ORAL_TABLET | Freq: Once | ORAL | 0 refills | Status: DC

## 2022-01-18 MED ORDER — FUROSEMIDE 20 MG PO TABS: 20.0000 mg | ORAL_TABLET | ORAL | 3 refills | Status: DC

## 2022-01-18 NOTE — Patient Instructions (Addendum)
Medication Instructions:  Your physician has recommended you make the following change in your medication:   START: Cozaar 50 mg daily START: Furosemide 20 mg every M-W-F STOP: Olmesartan/Hydrochlorothiazide  *If you need a refill on your cardiac medications before your next appointment, please call your pharmacy*   Lab Work: Your physician recommends that you return for lab work in:   Labs 1 week before CT: BMP  If you have labs (blood work) drawn today and your tests are completely normal, you will receive your results only by: [place] (if you have MyChart) OR A paper copy in the mail If you have any lab test that is abnormal or we need to change your treatment, we will call you to review the results.   Testing/Procedures:   Your cardiac CT will be scheduled at one of the below locations:   [facility] [address] [place], [place] [address] (336) [place] [address] [place], [place] [address] [phone]  [facility], [place] [address] [phone]  If scheduled at [facility], please arrive at the [facility] and Children's Entrance (Entrance C2) of [facility] 30 minutes prior to test start time. You can use the FREE valet parking offered at entrance C (encouraged to control the heart rate for the test)  Proceed to the [facility] Radiology Department (first floor) to check-in and test prep.  All radiology patients and guests should use entrance C2 at [facility], accessed from [facility], even though the hospital's physical address listed is [address].    If scheduled at [facility] or [facility], please arrive 15 mins early for check-in and test prep.   Please follow these instructions carefully (unless otherwise  directed):  On the Night Before the Test: Be sure to Drink plenty of water. Do not consume any caffeinated/decaffeinated beverages or chocolate 12 hours prior to your test. Do not take any antihistamines 12 hours prior to your test.  On the Day of the Test: Drink plenty of water until 1 hour prior to the test. Do not eat any food 1 hour prior to test. You may take your regular medications prior to the test.  Take metoprolol (Lopressor) two hours prior to test. HOLD Furosemide/Hydrochlorothiazide morning of the test. FEMALES- please wear underwire-free bra if available, avoid dresses & tight clothing      After the Test: Drink plenty of water. After receiving IV contrast, you may experience a mild flushed feeling. This is normal. On occasion, you may experience a mild rash up to 24 hours after the test. This is not dangerous. If this occurs, you can take Benadryl 25 mg and increase your fluid intake. If you experience trouble breathing, this can be serious. If it is severe call 911 IMMEDIATELY. If it is mild, please call our office. If you take any of these medications: Glipizide/Metformin, Avandament, Glucavance, please do not take 48 hours after completing test unless otherwise instructed.  We will call to schedule your test 2-4 weeks out understanding that some insurance companies will need an authorization prior to the service being performed.   For non-scheduling related questions, please contact the cardiac imaging nurse navigator should you have any questions/concerns: Marchia Bond, Cardiac Imaging Nurse Navigator Gordy Clement, Cardiac Imaging Nurse Navigator [facility] Direct Office Dial: [phone]  For scheduling needs, including cancellations and rescheduling, please call [place], [phone].    Follow-Up: At [facility], you and your health needs are our priority.  As part of our continuing mission to provide you with  exceptional heart care, we have created designated Provider Care Teams.  These Care Teams include your primary Cardiologist (physician) and Advanced Practice Providers (APPs -  Physician Assistants and Nurse Practitioners) who all work together to provide you with the care you need, when you need it.  We recommend signing up for the patient portal called "MyChart".  Sign up information is provided on this After Visit Summary.  MyChart is used to connect with patients for Virtual Visits (Telemedicine).  Patients are able to view lab/test results, encounter notes, upcoming appointments, etc.  Non-urgent messages can be sent to your provider as well.   To learn more about what you can do with MyChart, go to [email or web address].    Your next appointment:   3 month(s)  The format for your next appointment:   In Person  Provider:   Shirlee More, MD    Other Instructions Get an [employer] blood pressure cuff  Goal blood pressure is 24-580 systolic.  Important Information About Sugar         Healthbeat  Tips to measure your blood pressure correctly  To determine whether you have hypertension, a medical professional will take a blood pressure reading. How you prepare for the test, the position of your arm, and other factors can change a blood pressure reading by 10% or more. That could be enough to hide high blood pressure, start you on a drug you don't really need, or lead your doctor to incorrectly adjust your medications. National and international guidelines offer specific instructions for measuring blood pressure. If a doctor, nurse, or medical assistant isn't doing it right, don't hesitate to ask him or her to get with the guidelines. Here's what you can do to ensure a correct reading:  Don't drink a caffeinated beverage or smoke during the 30 minutes before the test.  Sit quietly for five minutes before the test begins.  During the measurement, sit in a chair with your feet on the  floor and your arm supported so your elbow is at about heart level.  The inflatable part of the cuff should completely cover at least 80% of your upper arm, and the cuff should be placed on bare skin, not over a shirt.  Don't talk during the measurement.  Have your blood pressure measured twice, with a brief break in between. If the readings are different by 5 points or more, have it done a third time. There are times to break these rules. If you sometimes feel lightheaded when getting out of bed in the morning or when you stand after sitting, you should have your blood pressure checked while seated and then while standing to see if it falls from one position to the next. Because blood pressure varies throughout the day, your doctor will rarely diagnose hypertension on the basis of a single reading. Instead, he or she will want to confirm the measurements on at least two occasions, usually within a few weeks of one another. The exception to this rule is if you have a blood pressure reading of 180/110 mm Hg or higher. A result this high usually calls for prompt treatment. It's also a good idea to have your blood pressure measured in both arms at least once, since the reading in one arm (usually  the right) may be higher than that in the left. A 2014 study in The American Journal of Medicine of nearly 3,400 people found average arm- to-arm differences in systolic blood pressure of about 5 points. The higher number should be used to make treatment decisions. In 2017, new guidelines from the [employer], the [employer] of Cardiology, and nine other health organizations lowered the diagnosis of high blood pressure to 130/80 mm Hg or higher for all adults. The guidelines also redefined the various blood pressure categories to now include normal, elevated, Stage 1 hypertension, Stage 2 hypertension, and hypertensive crisis (see "Blood pressure categories"). Blood pressure categories  Blood  pressure category SYSTOLIC (upper number)  DIASTOLIC (lower number)  Normal Less than 120 mm Hg and Less than 80 mm Hg  Elevated 120-129 mm Hg and Less than 80 mm Hg  High blood pressure: Stage 1 hypertension 130-139 mm Hg or 80-89 mm Hg  High blood pressure: Stage 2 hypertension 140 mm Hg or higher or 90 mm Hg or higher  Hypertensive crisis (consult your doctor immediately) Higher than 180 mm Hg and/or Higher than 120 mm Hg  Source: American Heart Association and American Stroke Association. For more on getting your blood pressure under control, buy Controlling Your Blood Pressure, a Special Health Report from [facility].

## 2022-01-20 ENCOUNTER — Telehealth: Payer: Self-pay | Admitting: Pulmonary Disease

## 2022-01-20 NOTE — Telephone Encounter (Unsigned)
PT and Husband want to come in for FU appts. Dr. Halford Chessman is who they normally see but they do not want to go to [facility] location.. Requesting change to a [facility] Dr. And to then set up appt for FU. Once approved please contact PT. Thanks  PS I told cust we would call back once we rev charts of her and husband to determine who the right specialist would be for them.

## 2022-01-21 NOTE — Telephone Encounter (Signed)
That’s fine with me

## 2022-01-23 ENCOUNTER — Ambulatory Visit (INDEPENDENT_AMBULATORY_CARE_PROVIDER_SITE_OTHER): Payer: Medicare Other

## 2022-01-23 ENCOUNTER — Encounter: Payer: Self-pay | Admitting: Cardiology

## 2022-01-23 DIAGNOSIS — Z0000 Encounter for general adult medical examination without abnormal findings: Secondary | ICD-10-CM | POA: Diagnosis not present

## 2022-01-23 NOTE — Telephone Encounter (Signed)
Called and spoke with Diana Hendrix letting her know that Dr. Halford Chessman was fine with her switching to a provider at the [address] location and she verbalized understanding. Appt scheduled for Diana Hendrix with Dr. Jenetta Downer. Nothing further needed.

## 2022-01-24 ENCOUNTER — Ambulatory Visit: Payer: Medicare Other

## 2022-01-24 DIAGNOSIS — I110 Hypertensive heart disease with heart failure: Secondary | ICD-10-CM | POA: Diagnosis not present

## 2022-01-24 DIAGNOSIS — E782 Mixed hyperlipidemia: Secondary | ICD-10-CM | POA: Diagnosis not present

## 2022-01-24 DIAGNOSIS — G4733 Obstructive sleep apnea (adult) (pediatric): Secondary | ICD-10-CM | POA: Diagnosis not present

## 2022-01-24 DIAGNOSIS — R072 Precordial pain: Secondary | ICD-10-CM | POA: Diagnosis not present

## 2022-01-24 DIAGNOSIS — R55 Syncope and collapse: Secondary | ICD-10-CM | POA: Diagnosis not present

## 2022-01-24 DIAGNOSIS — I5032 Chronic diastolic (congestive) heart failure: Secondary | ICD-10-CM | POA: Diagnosis not present

## 2022-01-24 NOTE — Progress Notes (Signed)
Subjective:   Diana Hendrix is a 77 y.o. female who presents for Medicare Annual (Subsequent) preventive examination.  I connected with  Diana Hendrix on 01/23/22 by a audio enabled telemedicine application and verified that I am speaking with the correct person using two identifiers.  Patient Location: Home  Provider Location: Office/Clinic  I discussed the limitations of evaluation and management by telemedicine. The patient expressed understanding and agreed to proceed.  Cardiac Risk Factors include: advanced age (>35mn, >>75women);diabetes mellitus;hypertension;obesity (BMI >30kg/m2)     Objective:    There were no vitals filed for this visit. There is no height or weight on file to calculate BMI.     11/10/2021    5:34 PM 11/09/2021    7:40 PM 01/08/2020    8:57 AM 05/14/2018    1:24 PM 04/17/2017   12:00 AM  Advanced Directives  Does Patient Have a Medical Advance Directive?  No Yes Yes Yes  Type of ATheatre stage managerof AAtlasLiving will HOasis Does patient want to make changes to medical advance directive?     No - Patient declined  Copy of HNewsomsin Chart?    No - copy requested No - copy requested  Would patient like information on creating a medical advance directive? No - Patient declined        Current Medications (verified) Outpatient Encounter Medications as of 01/23/2022  Medication Sig   acetaminophen (TYLENOL) 325 MG tablet Take 2 tablets (650 mg total) by mouth every 6 (six) hours as needed for mild pain (or Fever >/= 101).   albuterol (PROVENTIL) (2.5 MG/3ML) 0.083% nebulizer solution Take 3 mLs (2.5 mg total) by nebulization every 6 (six) hours as needed for wheezing or shortness of breath.   Cholecalciferol (VITAMIN D3) 50 MCG (2000 UT) TABS Take 1 tablet by mouth daily.   citalopram (CELEXA) 10 MG tablet TAKE 1 TABLET(10 MG) BY MOUTH DAILY (Patient taking differently: Take 10 mg by  mouth daily.)   Fluticasone-Umeclidin-Vilant (TRELEGY ELLIPTA) 100-62.5-25 MCG/ACT AEPB Inhale 1 puff into the lungs daily in the afternoon.   furosemide (LASIX) 20 MG tablet Take 1 tablet (20 mg total) by mouth every Monday, Wednesday, and Friday. (Patient taking differently: Take 20 mg by mouth daily.)   losartan (COZAAR) 50 MG tablet Take 1 tablet (50 mg total) by mouth daily.   metoprolol succinate (TOPROL-XL) 50 MG 24 hr tablet Take 0.5 tablets (25 mg total) by mouth daily. Take with or immediately following a meal.   montelukast (SINGULAIR) 10 MG tablet TAKE 1 TABLET(10 MG) BY MOUTH AT BEDTIME (Patient taking differently: Take 10 mg by mouth at bedtime.)   NON FORMULARY CPAP at bedtime   omeprazole (PRILOSEC) 40 MG capsule Take 1 capsule (40 mg total) by mouth daily.   Polyethyl Glycol-Propyl Glycol (SYSTANE OP) Place 1 drop into both eyes 4 (four) times daily.   polyethylene glycol (MIRALAX / GLYCOLAX) 17 g packet Take 17 g by mouth daily.   rosuvastatin (CRESTOR) 40 MG tablet TAKE 1 TABLET(40 MG) BY MOUTH DAILY (Patient taking differently: Take 40 mg by mouth daily.)   traMADol (ULTRAM) 50 MG tablet Take 1 tablet (50 mg total) by mouth every 12 (twelve) hours as needed for moderate pain.   metoprolol tartrate (LOPRESSOR) 50 MG tablet Take 1 tablet (50 mg total) by mouth once for 1 dose. Please take 2 hours before CT   [DISCONTINUED] amitriptyline (ELAVIL)  25 MG tablet TAKE 1 TABLET(25 MG) BY MOUTH AT BEDTIME (Patient taking differently: Take 25 mg by mouth at bedtime.)   [DISCONTINUED] fluticasone (FLONASE) 50 MCG/ACT nasal spray Place 1 spray into both nostrils at bedtime. (Patient not taking: Reported on 01/18/2022)   [DISCONTINUED] guaiFENesin (MUCINEX) 600 MG 12 hr tablet Take 1 tablet (600 mg total) by mouth 2 (two) times daily.   [DISCONTINUED] lidocaine (LIDODERM) 5 % Place 1 patch onto the skin daily. Remove & Discard patch within 12 hours or as directed by MD (Patient not taking:  Reported on 11/30/2021)   No facility-administered encounter medications on file as of 01/23/2022.    Allergies (verified) Zyrtec [cetirizine]   History: Past Medical History:  Diagnosis Date   Cancer ([place])    Diastolic heart failure (HCC)    Echo EF 60/65% Heart monitor was normal   GERD (gastroesophageal reflux disease)    History of colon polyps    Hyperlipidemia    Hypertension    Macular edema, cystoid    OSA (obstructive sleep apnea) 09/17/2012   Osteopenia    Plantar fasciitis 09/09/2019   Past Surgical History:  Procedure Laterality Date   CESAREAN SECTION     COLONOSCOPY  10/29/2013   Colonic polyp status post polypectomy. Mild sigmoid diverticulosis. Small internal hemorrhoids   FOOT NEUROMA SURGERY     s/p surgery in both feet   FOOT SURGERY Bilateral    Per patient   MENISCUS REPAIR Left 09/13/2016   miniscus repair right  Right 2019   SHOULDER SURGERY Left    arthroscopy.    TUBAL LIGATION     Family History  Problem Relation Age of Onset   Hyperlipidemia Mother    Heart disease Mother    Hypertension Mother    Atrial fibrillation Mother    CAD Mother    Congestive Heart Failure Mother    CVA Father    Hypertension Brother    Hypertension Son    Colon cancer Neg Hx    Esophageal cancer Neg Hx    Social History   Socioeconomic History   Marital status: Married    Spouse name: Diana Hendrix   Number of children: 2   Years of education: Not on file   Highest education level: Not on file  Occupational History   Not on file  Tobacco Use   Smoking status: Never   Smokeless tobacco: Never  Vaping Use   Vaping Use: Never used  Substance and Sexual Activity   Alcohol use: No   Drug use: No   Sexual activity: Not Currently  Other Topics Concern   Not on file  Social History Narrative   Lives with husband   Right handed   Caffeine: 1 cup of tea and 1 pepsi in the AM   Social Determinants of Health   Financial Resource Strain: Low Risk   (01/23/2022)   Overall Financial Resource Strain (CARDIA)    Difficulty of Paying Living Expenses: Not hard at all  Food Insecurity: No Food Insecurity (01/23/2022)   Hunger Vital Sign    Worried About Running Out of Food in the Last Year: Never true    Ran Out of Food in the Last Year: Never true  Transportation Needs: No Transportation Needs (01/23/2022)   PRAPARE - Hydrologist (Medical): No    Lack of Transportation (Non-Medical): No  Physical Activity: Inactive (01/23/2022)   Exercise Vital Sign    Days of Exercise per Week:  0 days    Minutes of Exercise per Session: 0 min  Stress: No Stress Concern Present (01/23/2022)   [place]    Feeling of Stress : Not at all  Social Connections: Moderately Integrated (01/23/2022)   Social Connection and Isolation Panel [NHANES]    Frequency of Communication with Friends and Family: More than three times a week    Frequency of Social Gatherings with Friends and Family: Once a week    Attends Religious Services: 1 to 4 times per year    Active Member of [employer] or Organizations: No    Attends Music therapist: Never    Marital Status: Married    Tobacco Counseling Counseling given: Not Answered   Clinical Intake:  Pre-visit preparation completed: Yes     BMI - recorded: 36.4 Nutritional Status: BMI > 30  Obese Nutritional Risks: None Diabetes: Yes (most recent A1C 6.5) CBG done?: No Did pt. bring in CBG monitor from home?: No How often do you need to have someone help you when you read instructions, pamphlets, or other written materials from your doctor or pharmacy?: 1 - Never Interpreter Needed?: No     Activities of Daily Living    01/23/2022    1:50 PM 11/10/2021    3:02 PM  In your present state of health, do you have any difficulty performing the following activities:  Hearing? 0 0  Vision? 0 0  Difficulty concentrating or  making decisions? 0 0  Walking or climbing stairs? 0 0  Dressing or bathing? 0 0  Doing errands, shopping? 0 0  Preparing Food and eating ? N   Using the Toilet? N   In the past six months, have you accidently leaked urine? N   Do you have problems with loss of bowel control? N   Managing your Medications? N   Managing your Finances? N   Housekeeping or managing your Housekeeping? N     Patient Care Team: Rochel Brome, MD as PCP - General (Family Medicine) Jola Schmidt, MD as Consulting Physician (Ophthalmology) Chesley Mires, MD as Consulting Physician (Pulmonary Disease) Richardo Priest, MD as Consulting Physician (Cardiology) Jackquline Denmark, MD as Consulting Physician (Gastroenterology)  Indicate any recent Medical Services you may have received from other than [facility] providers in the past year (date may be approximate).     Assessment:   This is a routine wellness examination for [place].  Hearing/Vision screen No results found.  Dietary issues and exercise activities discussed: Current Exercise Habits: The patient does not participate in regular exercise at present, Exercise limited by: Other - see comments (due to back pain)   Goals Addressed             This Visit's Progress    Patient Stated   On track    Better sleep        Depression Screen    01/23/2022    2:06 PM 10/24/2021    9:36 AM 06/28/2021    1:37 PM 04/12/2021    9:39 AM 02/09/2021    8:50 AM 01/20/2021   11:53 AM 01/04/2021    9:23 AM  PHQ 2/9 Scores  PHQ - 2 Score 0 0 0 0 0 0 0  PHQ- 9 Score  0 3    0    Fall Risk    01/23/2022    1:50 PM 06/28/2021    1:38 PM 04/12/2021    9:37 AM 02/09/2021  8:50 AM 01/20/2021   11:56 AM  Fall Risk   Falls in the past year? '1 1 1 1 '$ 0  Number falls in past yr: 1 0 1 0 0  Injury with Fall? 1 0 0 0 0  Risk for fall due to : History of fall(s);Orthopedic patient History of fall(s)   No Fall Risks  Follow up Falls evaluation completed;Education provided  Falls prevention discussed;Falls evaluation completed Falls evaluation completed Falls evaluation completed Falls evaluation completed;Education provided    FALL RISK PREVENTION PERTAINING TO THE HOME:  Any stairs in or around the home? Yes  If so, are there any without handrails? No  Home free of loose throw rugs in walkways, pet beds, electrical cords, etc? Yes  Adequate lighting in your home to reduce risk of falls? Yes   ASSISTIVE DEVICES UTILIZED TO PREVENT FALLS:  Life alert? No  Use of a cane, walker or w/c? No   Cognitive Function:        01/23/2022    2:23 PM 01/20/2021   11:57 AM 01/12/2020    9:17 AM  6CIT Screen  What Year? 0 points 0 points 0 points  What month? 0 points 0 points 0 points  What time? 0 points 0 points 0 points  Count back from 20 0 points 0 points 0 points  Months in reverse 0 points 0 points 0 points  Repeat phrase 0 points 0 points 0 points  Total Score 0 points 0 points 0 points    Immunizations Immunization History  Administered Date(s) Administered   COVID-19, mRNA, vaccine(Comirnaty)12 years and older 11/08/2021   Fluad Quad(high Dose 65+) 10/16/2018, 11/10/2019, 09/27/2020, 10/24/2021   Influenza, High Dose Seasonal PF 10/09/2016, 10/04/2017   Influenza,inj,Quad PF,6+ Mos 10/25/2012, 09/17/2013, 09/21/2015   Influenza-Unspecified 12/12/2014   Moderna Covid-19 Vaccine Bivalent Booster 44yr & up 01/04/2021   Moderna SARS-COV2 Booster Vaccination 06/21/2020   Moderna Sars-Covid-2 Vaccination 02/28/2019, 03/28/2019, 11/14/2019   Pneumococcal Conjugate-13 03/18/2015   Pneumococcal Polysaccharide-23 02/09/2011, 09/17/2013   Tdap 03/18/2015   Zoster Recombinat (Shingrix) 02/19/2021   Zoster, Live 09/17/2011    TDAP status: Up to date  Flu Vaccine status: Up to date  Pneumococcal vaccine status: Up to date  Covid-19 vaccine status: Completed vaccines  Qualifies for Shingles Vaccine? Yes   Zostavax completed No   Shingrix  Completed?: Yes  Screening Tests Health Maintenance  Topic Date Due   FOOT EXAM  Never done   OPHTHALMOLOGY EXAM  Never done   Medicare Annual Wellness (AWV)  01/20/2022   Zoster Vaccines- Shingrix (2 of 2) 04/24/2023 (Originally 04/16/2021)   MAMMOGRAM  04/06/2022   HEMOGLOBIN A1C  04/25/2022   Diabetic kidney evaluation - Urine ACR  11/09/2022   Diabetic kidney evaluation - eGFR measurement  12/01/2022   DTaP/Tdap/Td (2 - Td or Tdap) 03/17/2025   COLONOSCOPY (Pts 45-444yrInsurance coverage will need to be confirmed)  02/23/2029   Pneumonia Vaccine 6516Years old  Completed   INFLUENZA VACCINE  Completed   DEXA SCAN  Completed   COVID-19 Vaccine  Completed   Hepatitis C Screening  Completed   HPV VACCINES  Aged Out    Health Maintenance  Health Maintenance Due  Topic Date Due   FOOT EXAM  Never done   OPHTHALMOLOGY EXAM  Never done   Medicare Annual Wellness (AWV)  01/20/2022    Colorectal cancer screening: Type of screening: Colonoscopy. Completed 02/24/2019. Repeat every 10 years  Mammogram status: Completed 04/05/21. Repeat every year  Bone Density status: Completed 02/13/2020. Results reflect: Bone density results: OSTEOPENIA. Repeat every 2 years.  Lung Cancer Screening: (Low Dose CT Chest recommended if Age 3-80 years, 30 pack-year currently smoking OR have quit w/in 15years.) does not qualify.   Additional Screening:  Hepatitis C Screening: does qualify; Completed 11/2014  Vision Screening: Recommended annual ophthalmology exams for early detection of glaucoma and other disorders of the eye. Is the patient up to date with their annual eye exam?  No  Who is the provider or what is the name of the office in which the patient attends annual eye exams? [facility] - patient has an appointment scheduled in March  Dental Screening: Recommended annual dental exams for proper oral hygiene  Community Resource Referral / Chronic Care Management: CRR required this visit?   No   CCM required this visit?  No      Plan:    1- Eye exam coming up in March 2- Mammogram due after 04/06/2022 3- DEXA - Due after 02/12/22  I have personally reviewed and noted the following in the patient's chart:   Medical and social history Use of alcohol, tobacco or illicit drugs  Current medications and supplements including opioid prescriptions.  Functional ability and status Nutritional status Physical activity Advanced directives List of other physicians Hospitalizations, surgeries, and ER visits in previous 12 months Vitals Screenings to include cognitive, depression, and falls Referrals and appointments  In addition, I have reviewed and discussed with patient certain preventive protocols, quality metrics, and best practice recommendations. A written personalized care plan for preventive services as well as general preventive health recommendations were provided to patient.     Erie Noe, LPN   [id number]/04/8887

## 2022-01-24 NOTE — Patient Instructions (Signed)
Diana Hendrix , Thank you for taking time to come for your Medicare Wellness Visit. I appreciate your ongoing commitment to your health goals. Please review the following plan we discussed and let me know if I can assist you in the future.   Screening recommendations/referrals: Colonoscopy: Complete Mammogram: Due after 04/06/22 Bone Density: Due after 02/12/22 Recommended yearly ophthalmology/optometry visit for glaucoma screening and checkup Recommended yearly dental visit for hygiene and checkup  Vaccinations: Influenza vaccine: up-to-date Pneumococcal vaccine: up-to-date Tdap vaccine: Due 03/2025 Shingles vaccine: received first vaccine, declined second vaccine due to swelling with the first one    Advanced directives: Please bring a copy for your medical record   Preventive Care 76 Years and Older, Female   Preventive care refers to lifestyle choices and visits with your health care provider that can promote health and wellness.  What does preventive care include? A yearly physical exam. This is also called an annual well check. Dental exams once or twice a year. Routine eye exams. Ask your health care provider how often you should have your eyes checked. Personal lifestyle choices, including: Daily care of your teeth and gums. Regular physical activity. Eating a healthy diet. Avoiding tobacco and drug use. Limiting alcohol use. Practicing safe sex. Taking low-dose aspirin every day. Taking vitamin and mineral supplements as recommended by your health care provider.  What happens during an annual well check? The services and screenings done by your health care provider during your annual well check will depend on your age, overall health, lifestyle risk factors, and family history of disease.  Counseling Your health care provider may ask you questions about your: Alcohol use. Tobacco use. Drug use. Emotional well-being. Home and relationship well-being. Sexual  activity. Eating habits. History of falls. Memory and ability to understand (cognition). Work and work Statistician. Reproductive health.  Screening You may have the following tests or measurements: Height, weight, and BMI. Blood pressure. Lipid and cholesterol levels. These may be checked every 5 years, or more frequently if you are over 60 years old. Skin check. Lung cancer screening. You may have this screening every year starting at age 76 if you have a 30-pack-year history of smoking and currently smoke or have quit within the past 15 years. Fecal occult blood test (FOBT) of the stool. You may have this test every year starting at age 76. Flexible sigmoidoscopy or colonoscopy. You may have a sigmoidoscopy every 5 years or a colonoscopy every 10 years starting at age 76. Hepatitis C blood test. Hepatitis B blood test. Sexually transmitted disease (STD) testing. Diabetes screening. This is done by checking your blood sugar (glucose) after you have not eaten for a while (fasting). You may have this done every 1-3 years. Bone density scan. This is done to screen for osteoporosis. You may have this done starting at age 76. Mammogram. This may be done every 1-2 years. Talk to your health care provider about how often you should have regular mammograms. Talk with your health care provider about your test results, treatment options, and if necessary, the need for more tests.  Vaccines Your health care provider may recommend certain vaccines, such as: Influenza vaccine. This is recommended every year. Tetanus, diphtheria, and acellular pertussis (Tdap, Td) vaccine. You may need a Td booster every 10 years. Zoster vaccine. You may need this after age 40. Pneumococcal 13-valent conjugate (PCV13) vaccine. One dose is recommended after age 76. Pneumococcal polysaccharide (PPSV23) vaccine. One dose is recommended after age 76. Talk to your  health care provider about which screenings and vaccines  you need and how often you need them.  This information is not intended to replace advice given to you by your health care provider. Make sure you discuss any questions you have with your health care provider. Document Released: 01/29/2015 Document Revised: 09/22/2015 Document Reviewed: 11/03/2014 Elsevier Interactive Patient Education  2017 [employer] Prevention in the Home  Falls can cause injuries. They can happen to people of all ages. There are many things you can do to make your home safe and to help prevent falls.  What can I do on the outside of my home? Regularly fix the edges of walkways and driveways and fix any cracks. Remove anything that might make you trip as you walk through a door, such as a raised step or threshold. Trim any bushes or trees on the path to your home. Use bright outdoor lighting. Clear any walking paths of anything that might make someone trip, such as rocks or tools. Regularly check to see if handrails are loose or broken. Make sure that both sides of any steps have handrails. Any raised decks and porches should have guardrails on the edges. Have any leaves, snow, or ice cleared regularly. Use sand or salt on walking paths during winter. Clean up any spills in your garage right away. This includes oil or grease spills.  What can I do in the bathroom? Use night lights. Install grab bars by the toilet and in the tub and shower. Do not use towel bars as grab bars. Use non-skid mats or decals in the tub or shower. If you need to sit down in the shower, use a plastic, non-slip stool. Keep the floor dry. Clean up any water that spills on the floor as soon as it happens. Remove soap buildup in the tub or shower regularly. Attach bath mats securely with double-sided non-slip rug tape. Do not have throw rugs and other things on the floor that can make you trip.  What can I do in the bedroom? Use night lights. Make sure that you have a light by  your bed that is easy to reach. Do not use any sheets or blankets that are too big for your bed. They should not hang down onto the floor. Have a firm chair that has side arms. You can use this for support while you get dressed. Do not have throw rugs and other things on the floor that can make you trip.  What can I do in the kitchen? Clean up any spills right away. Avoid walking on wet floors. Keep items that you use a lot in easy-to-reach places. If you need to reach something above you, use a strong step stool that has a grab bar. Keep electrical cords out of the way. Do not use floor polish or wax that makes floors slippery. If you must use wax, use non-skid floor wax. Do not have throw rugs and other things on the floor that can make you trip.  What can I do with my stairs? Do not leave any items on the stairs. Make sure that there are handrails on both sides of the stairs and use them. Fix handrails that are broken or loose. Make sure that handrails are as long as the stairways. Check any carpeting to make sure that it is firmly attached to the stairs. Fix any carpet that is loose or worn. Avoid having throw rugs at the top or bottom of the stairs.  If you do have throw rugs, attach them to the floor with carpet tape. Make sure that you have a light switch at the top of the stairs and the bottom of the stairs. If you do not have them, ask someone to add them for you.  What else can I do to help prevent falls? Wear shoes that: Do not have high heels. Have rubber bottoms. Are comfortable and fit you well. Are closed at the toe. Do not wear sandals. If you use a stepladder: Make sure that it is fully opened. Do not climb a closed stepladder. Make sure that both sides of the stepladder are locked into place. Ask someone to hold it for you, if possible. Clearly mark and make sure that you can see: Any grab bars or handrails. First and last steps. Where the edge of each step is. Use  tools that help you move around (mobility aids) if they are needed. These include: Canes. Walkers. Scooters. Crutches. Turn on the lights when you go into a dark area. Replace any light bulbs as soon as they burn out. Set up your furniture so you have a clear path. Avoid moving your furniture around. If any of your floors are uneven, fix them. If there are any pets around you, be aware of where they are. Review your medicines with your doctor. Some medicines can make you feel dizzy. This can increase your chance of falling. Ask your doctor what other things that you can do to help prevent falls.  This information is not intended to replace advice given to you by your health care provider. Make sure you discuss any questions you have with your health care provider. Document Released: 10/29/2008 Document Revised: 06/10/2015 Document Reviewed: 02/06/2014 Elsevier Interactive Patient Education  2017 [employer].

## 2022-01-25 ENCOUNTER — Telehealth (HOSPITAL_COMMUNITY): Payer: Self-pay | Admitting: Emergency Medicine

## 2022-01-25 LAB — BASIC METABOLIC PANEL
BUN/Creatinine Ratio: 11 — ABNORMAL LOW (ref 12–28)
BUN: 14 mg/dL (ref 8–27)
CO2: 25 mmol/L (ref 20–29)
Calcium: 9.8 mg/dL (ref 8.7–10.3)
Chloride: 101 mmol/L (ref 96–106)
Creatinine, Ser: 1.22 mg/dL — ABNORMAL HIGH (ref 0.57–1.00)
Glucose: 98 mg/dL (ref 70–99)
Potassium: 4.8 mmol/L (ref 3.5–5.2)
Sodium: 138 mmol/L (ref 134–144)
eGFR: 46 mL/min/{1.73_m2} — ABNORMAL LOW (ref >59)

## 2022-01-25 NOTE — Telephone Encounter (Signed)
Reaching out to patient to offer assistance regarding upcoming cardiac imaging study; pt verbalizes understanding of appt date/time, parking situation and where to check in, pre-test NPO status and medications ordered, and verified current allergies; name and call back number provided for further questions should they arise Marchia Bond RN Navigator Cardiac Imaging [facility] [phone] office [phone] cell  Arrival [address] entrance Holding lasix, daily metop dose (low BP was corrected by taking patient off losartan, BP has returned to normal per patient) '100mg'$  metoprolol tartrate

## 2022-01-26 ENCOUNTER — Ambulatory Visit (HOSPITAL_COMMUNITY)
Admission: RE | Admit: 2022-01-26 | Discharge: 2022-01-26 | Disposition: A | Discharge status: Home / Self Care | Payer: Medicare Other | Source: Ambulatory Visit | Attending: Cardiology | Admitting: Cardiology

## 2022-01-26 DIAGNOSIS — R072 Precordial pain: Secondary | ICD-10-CM | POA: Diagnosis not present

## 2022-01-26 MED ORDER — IOHEXOL 350 MG/ML SOLN
100.0000 mL | Freq: Once | INTRAVENOUS | Status: AC | PRN
  Administered 2022-01-26: 100 mL via INTRAVENOUS

## 2022-01-26 MED ORDER — NITROGLYCERIN 0.4 MG SL SUBL
SUBLINGUAL_TABLET | SUBLINGUAL | Status: AC
  Filled 2022-01-26: qty 2

## 2022-01-26 MED ORDER — NITROGLYCERIN 0.4 MG SL SUBL
0.8000 mg | SUBLINGUAL_TABLET | Freq: Once | SUBLINGUAL | Status: AC
  Administered 2022-01-26: 0.8 mg via SUBLINGUAL

## 2022-01-26 NOTE — Telephone Encounter (Signed)
Spoke with pt about her medications and blood pressures. Advised of Dr. Joya Gaskins reply. She stated that she has noticed that her weight has leveled off. She agreed with the plan and will continue to check her blood pressures at home and report the readings. She had no further questions.

## 2022-01-28 ENCOUNTER — Other Ambulatory Visit: Payer: Self-pay | Admitting: Family Medicine

## 2022-01-28 DIAGNOSIS — I110 Hypertensive heart disease with heart failure: Secondary | ICD-10-CM

## 2022-01-31 ENCOUNTER — Ambulatory Visit: Payer: Medicare Other

## 2022-01-31 NOTE — Progress Notes (Addendum)
Subjective:  Patient ID: Diana Hendrix, female    DOB: June 27, 1945  Age: 77 y.o. MRN: [id number]  Chief Complaint  Patient presents with   Diabetes   Hyperlipidemia   Hypertension   Diabetes:  Complications: hypertension, hyperlipidemia. Glucose checking: She does not check her blood sugar. Hypoglycemia: no Most recent A1C:6.5 Current medications: No medications. Last Eye Exam: Appt next month Foot checks: daily.  Hypertension: Dr. Bettina Gavia took her off her bp medicines. He decreased furosemide 20 mg three times a week, but she started gaining weight and swelling so increased to once daily. She called Dr. Bettina Gavia and he said ok to take lasix. He started losartan 50 mg daily. Stopped olmesartan/hydrochlorothiazide and continue toprol xl 50 mg 1/2 daily.  Her systolic blood pressure [id number]'[id number]  Hyperlipidemia: Takes Rosuvastatin 40 mg daily.  GERD: Currently taking Omeprazole 40 mg daily.  Asthma: Using monteluskast 10 mg 1 tablet at bedtime, I  OSA: Patient on CPAP. She is doing well. Patient feels rested. Compliant with nightly use.   Depression: Taking Amitriptyline 25 mg at bedtime, Celexa 10 mg daily.    10/24/2021    9:36 AM 06/28/2021    1:37 PM 04/12/2021    9:39 AM  PHQ9 SCORE ONLY  PHQ-9 Total Score 0 3 0   Chronic back pain: seeing Dr. Veverly Fells. Scheduled to return in the next month. If not better her understanding is, he will consider a kyphoplasty.  CIC: Ran out of linzess and started the trulance. Trulance worked great but paid $200 per month.  Takes miralax 1/2 dose daily. Patient reviewed her formulary amitiza appears to be the best price.    Current Outpatient Medications on File Prior to Visit  Medication Sig Dispense Refill   acetaminophen (TYLENOL) 325 MG tablet Take 2 tablets (650 mg total) by mouth every 6 (six) hours as needed for mild pain (or Fever >/= 101).     Cholecalciferol (VITAMIN D3) 50 MCG (2000 UT) TABS Take 1 tablet by mouth daily.      furosemide (LASIX) 20 MG tablet Take 1 tablet (20 mg total) by mouth every Monday, Wednesday, and Friday. (Patient taking differently: Take 20 mg by mouth daily.) 35 tablet 3   losartan (COZAAR) 50 MG tablet Take 1 tablet (50 mg total) by mouth daily. 90 tablet 3   metoprolol succinate (TOPROL-XL) 50 MG 24 hr tablet TAKE 1 AND 1/2 TABLETS(75 MG) BY MOUTH DAILY WITH OR IMMEDIATELY FOLLOWING A MEAL (Patient taking differently: Take 25 mg by mouth daily.) 90 tablet 0   montelukast (SINGULAIR) 10 MG tablet TAKE 1 TABLET(10 MG) BY MOUTH AT BEDTIME (Patient taking differently: Take 10 mg by mouth at bedtime.) 90 tablet 3   NON FORMULARY CPAP at bedtime     Polyethyl Glycol-Propyl Glycol (SYSTANE OP) Place 1 drop into both eyes 4 (four) times daily.     polyethylene glycol (MIRALAX / GLYCOLAX) 17 g packet Take 17 g by mouth daily.     traMADol (ULTRAM) 50 MG tablet Take 1 tablet (50 mg total) by mouth every 12 (twelve) hours as needed for moderate pain. 10 tablet 0   No current facility-administered medications on file prior to visit.   Past Medical History:  Diagnosis Date   Cancer ([place])    Diastolic heart failure ([facility])    Echo EF 60/65% Heart monitor was normal   GERD (gastroesophageal reflux disease)    History of colon polyps    Hyperlipidemia    Hypertension  Macular edema, cystoid    OSA (obstructive sleep apnea) 09/17/2012   Osteopenia    Plantar fasciitis 09/09/2019   Past Surgical History:  Procedure Laterality Date   CESAREAN SECTION     COLONOSCOPY  10/29/2013   Colonic polyp status post polypectomy. Mild sigmoid diverticulosis. Small internal hemorrhoids   FOOT NEUROMA SURGERY     s/p surgery in both feet   FOOT SURGERY Bilateral    Per patient   MENISCUS REPAIR Left 09/13/2016   miniscus repair right  Right 2019   SHOULDER SURGERY Left    arthroscopy.    TUBAL LIGATION      Family History  Problem Relation Age of Onset   Hyperlipidemia Mother    Heart disease Mother     Hypertension Mother    Atrial fibrillation Mother    CAD Mother    Congestive Heart Failure Mother    CVA Father    Hypertension Brother    Hypertension Son    Colon cancer Neg Hx    Esophageal cancer Neg Hx    Social History   Socioeconomic History   Marital status: Married    Spouse name: Transport planner   Number of children: 2   Years of education: Not on file   Highest education level: Not on file  Occupational History   Not on file  Tobacco Use   Smoking status: Never   Smokeless tobacco: Never  Vaping Use   Vaping Use: Never used  Substance and Sexual Activity   Alcohol use: No   Drug use: No   Sexual activity: Not Currently  Other Topics Concern   Not on file  Social History Narrative   Lives with husband   Right handed   Caffeine: 1 cup of tea and 1 pepsi in the AM   Social Determinants of Health   Financial Resource Strain: Low Risk  (01/23/2022)   Overall Financial Resource Strain (CARDIA)    Difficulty of Paying Living Expenses: Not hard at all  Food Insecurity: No Food Insecurity (01/23/2022)   Hunger Vital Sign    Worried About Running Out of Food in the Last Year: Never true    Ran Out of Food in the Last Year: Never true  Transportation Needs: No Transportation Needs (01/23/2022)   PRAPARE - Hydrologist (Medical): No    Lack of Transportation (Non-Medical): No  Physical Activity: Inactive (01/23/2022)   Exercise Vital Sign    Days of Exercise per Week: 0 days    Minutes of Exercise per Session: 0 min  Stress: No Stress Concern Present (01/23/2022)   [place]    Feeling of Stress : Not at all  Social Connections: Moderately Integrated (01/23/2022)   Social Connection and Isolation Panel [NHANES]    Frequency of Communication with Friends and Family: More than three times a week    Frequency of Social Gatherings with Friends and Family: Once a week    Attends  Religious Services: 1 to 4 times per year    Active Member of [employer] or Organizations: No    Attends Archivist Meetings: Never    Marital Status: Married    Review of Systems  Constitutional:  Negative for appetite change, fatigue and fever.  HENT:  Negative for congestion, ear pain, sinus pressure and sore throat.   Respiratory:  Negative for cough, chest tightness, shortness of breath and wheezing.   Cardiovascular:  Negative  for chest pain and palpitations.  Gastrointestinal:  Negative for abdominal pain, constipation, diarrhea, nausea and vomiting.  Genitourinary:  Negative for dysuria and hematuria.  Musculoskeletal:  Negative for arthralgias, back pain, joint swelling and myalgias.  Skin:  Negative for rash.  Neurological:  Negative for dizziness, weakness and headaches.  Psychiatric/Behavioral:  Negative for dysphoric mood. The patient is not nervous/anxious.      Objective:  BP 130/70   Pulse 75   Temp 98.1 F (36.7 C)   Resp 15   Ht '5\' 2"'$  (1.575 m)   Wt 200 lb (90.7 kg)   SpO2 94%   BMI 36.58 kg/m      02/17/2022   10:58 AM 02/01/2022    8:57 AM 01/26/2022   11:53 AM  BP/Weight  Systolic BP [phone]  Diastolic BP 76 70 65  Wt. (Lbs) 199 200   BMI 36.4 kg/m2 36.58 kg/m2     Physical Exam Vitals reviewed.  Constitutional:      Appearance: Normal appearance.  Neck:     Vascular: No carotid bruit.  Cardiovascular:     Rate and Rhythm: Normal rate and regular rhythm.     Heart sounds: Normal heart sounds.  Pulmonary:     Effort: Pulmonary effort is normal.     Breath sounds: Normal breath sounds.  Abdominal:     General: Abdomen is flat. Bowel sounds are normal.     Palpations: Abdomen is soft.     Tenderness: There is no abdominal tenderness.  Neurological:     Mental Status: She is alert and oriented to person, place, and time.  Psychiatric:        Mood and Affect: Mood normal.        Behavior: Behavior normal.     Diabetic Foot Exam  - Simple   Simple Foot Form  02/01/2022  9:19 PM  Visual Inspection No deformities, no ulcerations, no other skin breakdown bilaterally: Yes Sensation Testing Intact to touch and monofilament testing bilaterally: Yes Pulse Check Posterior Tibialis and Dorsalis pulse intact bilaterally: Yes Comments      Lab Results  Component Value Date   WBC 6.7 02/01/2022   HGB 12.9 02/01/2022   HCT 39.3 02/01/2022   PLT 280 02/01/2022   GLUCOSE 98 02/01/2022   CHOL 143 02/01/2022   TRIG 182 (H) 02/01/2022   HDL 41 02/01/2022   LDLCALC 71 02/01/2022   ALT 19 02/01/2022   AST 21 02/01/2022   NA 139 02/01/2022   K 4.8 02/01/2022   CL 101 02/01/2022   CREATININE 0.96 02/01/2022   BUN 13 02/01/2022   CO2 22 02/01/2022   TSH 1.810 02/09/2021   INR 1.1 11/09/2021   HGBA1C 6.2 (H) 02/01/2022      Assessment & Plan:   Hypertensive heart disease with chronic diastolic congestive heart failure ([place]) Assessment & Plan: Well controlled.  No medicines.  Continue to work on eating a healthy diet and exercise.  Labs drawn today.    Orders: -     Comprehensive metabolic panel -     CBC with Differential/Platelet -     Cardiovascular Risk Assessment  Hypertension complicating diabetes ([place]) Assessment & Plan: Control: Controlled Recommend check sugars fasting daily. Recommend check feet daily. Recommend annual eye exams. Medicines: None Continue to work on eating a healthy diet and exercise.  Labs drawn today.     Orders: -     Hemoglobin A1c -     Microalbumin / creatinine  urine ratio  Gastroesophageal reflux disease without esophagitis Assessment & Plan: Increase omeprazole 40 mg one twice daily.   Orders: -     Omeprazole; Take 1 capsule (40 mg total) by mouth in the morning and at bedtime.  Dispense: 180 capsule; Refill: 1  OSA (obstructive sleep apnea) Assessment & Plan: Benefiting and compliant with cpap.    Mild recurrent major depression ([place]) Assessment &  Plan: The current medical regimen is effective;  continue present plan and medications.   Orders: -     Citalopram Hydrobromide; Take 1 tablet (10 mg total) by mouth daily.  Dispense: 90 tablet; Refill: 3  Mixed hyperlipidemia Assessment & Plan: Well controlled.  No changes to medicines.  Continue to work on eating a healthy diet and exercise.  Labs drawn today.    Orders: -     Lipid panel -     Rosuvastatin Calcium; Take 1 tablet (40 mg total) by mouth daily.  Dispense: 90 tablet; Refill: 1  Mild intermittent asthma without complication Assessment & Plan: On singulair. If worsens, will add inhalers.    Chronic idiopathic constipation Assessment & Plan: The current medical regimen is effective;  continue present plan and medications.    Hypertension associated with diabetes ([place]) Assessment & Plan: Control: Controlled Recommend check sugars fasting daily. Recommend check feet daily. Recommend annual eye exams. Medicines: None Continue to work on eating a healthy diet and exercise.  Labs drawn today.          Meds ordered this encounter  Medications   citalopram (CELEXA) 10 MG tablet    Sig: Take 1 tablet (10 mg total) by mouth daily.    Dispense:  90 tablet    Refill:  3   rosuvastatin (CRESTOR) 40 MG tablet    Sig: Take 1 tablet (40 mg total) by mouth daily.    Dispense:  90 tablet    Refill:  1   omeprazole (PRILOSEC) 40 MG capsule    Sig: Take 1 capsule (40 mg total) by mouth in the morning and at bedtime.    Dispense:  180 capsule    Refill:  1    Orders Placed This Encounter  Procedures   Comprehensive metabolic panel   Hemoglobin A1c   Lipid panel   CBC with Differential/Platelet   Microalbumin / creatinine urine ratio   Cardiovascular Risk Assessment     Follow-up: Return in about 3 months (around 05/03/2022) for chronic fasting.  An After Visit Summary was printed and given to the patient.  Thompson Caul, CMA, acting as a scribe for  Rochel Brome, MD.,have documented all relevant documentation on the behalf of Rochel Brome, MD,as directed by  Rochel Brome, MD while in the presence of Rochel Brome, MD.    Rochel Brome, MD [place] [phone]

## 2022-02-01 ENCOUNTER — Telehealth: Payer: Self-pay | Admitting: Cardiology

## 2022-02-01 ENCOUNTER — Encounter: Payer: Self-pay | Admitting: Family Medicine

## 2022-02-01 ENCOUNTER — Ambulatory Visit (INDEPENDENT_AMBULATORY_CARE_PROVIDER_SITE_OTHER): Payer: Medicare Other | Admitting: Family Medicine

## 2022-02-01 VITALS — BP 130/70 | HR 75 | Temp 98.1°F | Resp 15 | Ht 62.0 in | Wt 200.0 lb

## 2022-02-01 DIAGNOSIS — I5032 Chronic diastolic (congestive) heart failure: Secondary | ICD-10-CM | POA: Diagnosis not present

## 2022-02-01 DIAGNOSIS — K219 Gastro-esophageal reflux disease without esophagitis: Secondary | ICD-10-CM | POA: Diagnosis not present

## 2022-02-01 DIAGNOSIS — G4733 Obstructive sleep apnea (adult) (pediatric): Secondary | ICD-10-CM | POA: Diagnosis not present

## 2022-02-01 DIAGNOSIS — I110 Hypertensive heart disease with heart failure: Secondary | ICD-10-CM | POA: Diagnosis not present

## 2022-02-01 DIAGNOSIS — K5904 Chronic idiopathic constipation: Secondary | ICD-10-CM

## 2022-02-01 DIAGNOSIS — J4520 Mild intermittent asthma, uncomplicated: Secondary | ICD-10-CM | POA: Diagnosis not present

## 2022-02-01 DIAGNOSIS — F330 Major depressive disorder, recurrent, mild: Secondary | ICD-10-CM

## 2022-02-01 DIAGNOSIS — E1159 Type 2 diabetes mellitus with other circulatory complications: Secondary | ICD-10-CM

## 2022-02-01 DIAGNOSIS — J4540 Moderate persistent asthma, uncomplicated: Secondary | ICD-10-CM

## 2022-02-01 DIAGNOSIS — E118 Type 2 diabetes mellitus with unspecified complications: Secondary | ICD-10-CM | POA: Diagnosis not present

## 2022-02-01 DIAGNOSIS — I152 Hypertension secondary to endocrine disorders: Secondary | ICD-10-CM

## 2022-02-01 DIAGNOSIS — E559 Vitamin D deficiency, unspecified: Secondary | ICD-10-CM

## 2022-02-01 DIAGNOSIS — E782 Mixed hyperlipidemia: Secondary | ICD-10-CM | POA: Diagnosis not present

## 2022-02-01 MED ORDER — ROSUVASTATIN CALCIUM 40 MG PO TABS: 40.0000 mg | ORAL_TABLET | Freq: Every day | ORAL | 1 refills | Status: DC

## 2022-02-01 MED ORDER — CITALOPRAM HYDROBROMIDE 10 MG PO TABS: 10.0000 mg | ORAL_TABLET | Freq: Every day | ORAL | 3 refills | Status: DC

## 2022-02-01 MED ORDER — OMEPRAZOLE 40 MG PO CPDR: 40.0000 mg | DELAYED_RELEASE_CAPSULE | Freq: Two times a day (BID) | ORAL | 1 refills | Status: DC

## 2022-02-01 NOTE — Telephone Encounter (Signed)
Patient returned nurse Richard's call. Call was transferred to nurse.

## 2022-02-01 NOTE — Patient Instructions (Signed)
Increase omeprazole 40 mg one twice daily.

## 2022-02-02 ENCOUNTER — Ambulatory Visit: Payer: Medicare Other

## 2022-02-02 LAB — CBC WITH DIFFERENTIAL/PLATELET
Basophils Absolute: 0 10*3/uL (ref 0.0–0.2)
Basos: 1 %
EOS (ABSOLUTE): 0.2 10*3/uL (ref 0.0–0.4)
Eos: 3 %
Hematocrit: 39.3 % (ref 34.0–46.6)
Hemoglobin: 12.9 g/dL (ref 11.1–15.9)
Immature Grans (Abs): 0 10*3/uL (ref 0.0–0.1)
Immature Granulocytes: 0 %
Lymphocytes Absolute: 2 10*3/uL (ref 0.7–3.1)
Lymphs: 29 %
MCH: 29.1 pg (ref 26.6–33.0)
MCHC: 32.8 g/dL (ref 31.5–35.7)
MCV: 89 fL (ref 79–97)
Monocytes Absolute: 0.5 10*3/uL (ref 0.1–0.9)
Monocytes: 7 %
Neutrophils Absolute: 4 10*3/uL (ref 1.4–7.0)
Neutrophils: 60 %
Platelets: 280 10*3/uL (ref 150–450)
RBC: 4.44 x10E6/uL (ref 3.77–5.28)
RDW: 13.6 % (ref 11.7–15.4)
WBC: 6.7 10*3/uL (ref 3.4–10.8)

## 2022-02-02 LAB — HEMOGLOBIN A1C
Est. average glucose Bld gHb Est-mCnc: 131 mg/dL
Hgb A1c MFr Bld: 6.2 % — ABNORMAL HIGH (ref 4.8–5.6)

## 2022-02-02 LAB — COMPREHENSIVE METABOLIC PANEL
ALT: 19 IU/L (ref 0–32)
AST: 21 IU/L (ref 0–40)
Albumin/Globulin Ratio: 1.5 (ref 1.2–2.2)
Albumin: 4.3 g/dL (ref 3.8–4.8)
Alkaline Phosphatase: 87 IU/L (ref 44–121)
BUN/Creatinine Ratio: 14 (ref 12–28)
BUN: 13 mg/dL (ref 8–27)
Bilirubin Total: 0.4 mg/dL (ref 0.0–1.2)
CO2: 22 mmol/L (ref 20–29)
Calcium: 9.9 mg/dL (ref 8.7–10.3)
Chloride: 101 mmol/L (ref 96–106)
Creatinine, Ser: 0.96 mg/dL (ref 0.57–1.00)
Globulin, Total: 2.9 g/dL (ref 1.5–4.5)
Glucose: 98 mg/dL (ref 70–99)
Potassium: 4.8 mmol/L (ref 3.5–5.2)
Sodium: 139 mmol/L (ref 134–144)
Total Protein: 7.2 g/dL (ref 6.0–8.5)
eGFR: 62 mL/min/{1.73_m2} (ref >59)

## 2022-02-02 LAB — LIPID PANEL
Chol/HDL Ratio: 3.5 ratio (ref 0.0–4.4)
Cholesterol, Total: 143 mg/dL (ref 100–199)
HDL: 41 mg/dL (ref >39)
LDL Chol Calc (NIH): 71 mg/dL (ref 0–99)
Triglycerides: 182 mg/dL — ABNORMAL HIGH (ref 0–149)
VLDL Cholesterol Cal: 31 mg/dL (ref 5–40)

## 2022-02-02 LAB — CARDIOVASCULAR RISK ASSESSMENT

## 2022-02-02 LAB — MICROALBUMIN / CREATININE URINE RATIO
Creatinine, Urine: 88.9 mg/dL
Microalb/Creat Ratio: 12 mg/g creat (ref 0–29)
Microalbumin, Urine: 11 ug/mL

## 2022-02-02 NOTE — Progress Notes (Signed)
Blood count normal.  Liver function normal.  Kidney function normal. Thyroid function normal.  Cholesterol: triglycerides 182. Worsened. LDL good.  HBA1C: 6.2. improved.  Not spilling protein in urine.

## 2022-02-06 ENCOUNTER — Encounter: Payer: Self-pay | Admitting: Cardiology

## 2022-02-07 ENCOUNTER — Ambulatory Visit: Payer: Medicare Other

## 2022-02-07 NOTE — Assessment & Plan Note (Signed)
Well controlled.  ?No changes to medicines.  ?Continue to work on eating a healthy diet and exercise.  ?Labs drawn today.  ?

## 2022-02-07 NOTE — Assessment & Plan Note (Addendum)
Well controlled.  No medicines.  Continue to work on eating a healthy diet and exercise.  Labs drawn today.   

## 2022-02-07 NOTE — Assessment & Plan Note (Signed)
Increase omeprazole 40 mg one twice daily.

## 2022-02-07 NOTE — Assessment & Plan Note (Signed)
The current medical regimen is effective;  continue present plan and medications.

## 2022-02-07 NOTE — Assessment & Plan Note (Signed)
Control: Controlled Recommend check sugars fasting daily. Recommend check feet daily. Recommend annual eye exams. Medicines: None Continue to work on eating a healthy diet and exercise.  Labs drawn today.   

## 2022-02-08 NOTE — Assessment & Plan Note (Signed)
Benefiting and compliant with cpap.

## 2022-02-15 DIAGNOSIS — M4854XD Collapsed vertebra, not elsewhere classified, thoracic region, subsequent encounter for fracture with routine healing: Secondary | ICD-10-CM | POA: Diagnosis not present

## 2022-02-15 DIAGNOSIS — M545 Low back pain, unspecified: Secondary | ICD-10-CM | POA: Diagnosis not present

## 2022-02-17 ENCOUNTER — Encounter: Payer: Self-pay | Admitting: Pulmonary Disease

## 2022-02-17 ENCOUNTER — Ambulatory Visit (INDEPENDENT_AMBULATORY_CARE_PROVIDER_SITE_OTHER): Payer: Medicare Other | Admitting: Pulmonary Disease

## 2022-02-17 VITALS — BP 130/76 | HR 73 | Ht 62.0 in | Wt 199.0 lb

## 2022-02-17 DIAGNOSIS — G4733 Obstructive sleep apnea (adult) (pediatric): Secondary | ICD-10-CM

## 2022-02-17 DIAGNOSIS — M6281 Muscle weakness (generalized): Secondary | ICD-10-CM | POA: Diagnosis not present

## 2022-02-17 DIAGNOSIS — J4550 Severe persistent asthma, uncomplicated: Secondary | ICD-10-CM

## 2022-02-17 MED ORDER — BENZONATATE 100 MG PO CAPS: 200.0000 mg | ORAL_CAPSULE | Freq: Three times a day (TID) | ORAL | 1 refills | Status: DC | PRN

## 2022-02-17 NOTE — Patient Instructions (Signed)
I will see you in 2 to 3 months  We will get a chest x-ray on your next visit Pulmonary function test at next visit  Call us with significant concerns  Continue using your CPAP on a nightly basis

## 2022-02-17 NOTE — Progress Notes (Signed)
Alvin Rubano Loren    [id number]    1945-11-06  Primary Care Physician:Cox, Elnita Maxwell, MD  Referring Physician: Rochel Brome, MD [address] [place],  [place] [address]  Chief complaint:   Patient known to Dr. Halford Chessman who I am seeing for follow-up History of asthma, obstructive sleep apnea  HPI:  Obstructive sleep apnea compliant with CPAP therapy, currently on CPAP of 6, tolerating CPAP well, sleeping well and waking up feeling like she is at a good nights rest Does have some coughing in the mornings  History of asthma Has not tolerated inhalers over time, as being on different inhalers all which have caused hoarseness During the last visit she was switched to Trelegy which she stated she tried for about 2 months and did cause hoarseness  Albuterol also gives a protracted cough after using it  She did try Flonase for a while and stopped it because it was not helping  She feels better off without using the inhalers  She does use Tessalon Perles once in a while for cough and this helps the coughing  She does feel congested and does have wheezing sometimes  Was recently treated for pneumonia with a course of antibiotics  She continues to try to stay active Outpatient Encounter Medications as of 02/17/2022  Medication Sig   acetaminophen (TYLENOL) 325 MG tablet Take 2 tablets (650 mg total) by mouth every 6 (six) hours as needed for mild pain (or Fever >/= 101).   Cholecalciferol (VITAMIN D3) 50 MCG (2000 UT) TABS Take 1 tablet by mouth daily.   citalopram (CELEXA) 10 MG tablet Take 1 tablet (10 mg total) by mouth daily.   furosemide (LASIX) 20 MG tablet Take 1 tablet (20 mg total) by mouth every Monday, Wednesday, and Friday. (Patient taking differently: Take 20 mg by mouth daily.)   losartan (COZAAR) 50 MG tablet Take 1 tablet (50 mg total) by mouth daily.   metoprolol succinate (TOPROL-XL) 50 MG 24 hr tablet TAKE 1 AND 1/2 TABLETS(75 MG) BY MOUTH DAILY WITH OR  IMMEDIATELY FOLLOWING A MEAL (Patient taking differently: Take 25 mg by mouth daily.)   montelukast (SINGULAIR) 10 MG tablet TAKE 1 TABLET(10 MG) BY MOUTH AT BEDTIME (Patient taking differently: Take 10 mg by mouth at bedtime.)   NON FORMULARY CPAP at bedtime   omeprazole (PRILOSEC) 40 MG capsule Take 1 capsule (40 mg total) by mouth in the morning and at bedtime.   Polyethyl Glycol-Propyl Glycol (SYSTANE OP) Place 1 drop into both eyes 4 (four) times daily.   polyethylene glycol (MIRALAX / GLYCOLAX) 17 g packet Take 17 g by mouth daily.   rosuvastatin (CRESTOR) 40 MG tablet Take 1 tablet (40 mg total) by mouth daily.   traMADol (ULTRAM) 50 MG tablet Take 1 tablet (50 mg total) by mouth every 12 (twelve) hours as needed for moderate pain.   No facility-administered encounter medications on file as of 02/17/2022.    Allergies as of 02/17/2022 - Review Complete 02/17/2022  Allergen Reaction Noted   Zyrtec [cetirizine]  01/04/2021    Past Medical History:  Diagnosis Date   Cancer ([place])    Diastolic heart failure ([facility])    Echo EF 60/65% Heart monitor was normal   GERD (gastroesophageal reflux disease)    History of colon polyps    Hyperlipidemia    Hypertension    Macular edema, cystoid    OSA (obstructive sleep apnea) 09/17/2012   Osteopenia  Plantar fasciitis 09/09/2019    Past Surgical History:  Procedure Laterality Date   CESAREAN SECTION     COLONOSCOPY  10/29/2013   Colonic polyp status post polypectomy. Mild sigmoid diverticulosis. Small internal hemorrhoids   FOOT NEUROMA SURGERY     s/p surgery in both feet   FOOT SURGERY Bilateral    Per patient   MENISCUS REPAIR Left 09/13/2016   miniscus repair right  Right 2019   SHOULDER SURGERY Left    arthroscopy.    TUBAL LIGATION      Family History  Problem Relation Age of Onset   Hyperlipidemia Mother    Heart disease Mother    Hypertension Mother    Atrial fibrillation Mother    CAD Mother    Congestive Heart  Failure Mother    CVA Father    Hypertension Brother    Hypertension Son    Colon cancer Neg Hx    Esophageal cancer Neg Hx     Social History   Socioeconomic History   Marital status: Married    Spouse name: Transport planner   Number of children: 2   Years of education: Not on file   Highest education level: Not on file  Occupational History   Not on file  Tobacco Use   Smoking status: Never   Smokeless tobacco: Never  Vaping Use   Vaping Use: Never used  Substance and Sexual Activity   Alcohol use: No   Drug use: No   Sexual activity: Not Currently  Other Topics Concern   Not on file  Social History Narrative   Lives with husband   Right handed   Caffeine: 1 cup of tea and 1 pepsi in the AM   Social Determinants of Health   Financial Resource Strain: Low Risk  (01/23/2022)   Overall Financial Resource Strain (CARDIA)    Difficulty of Paying Living Expenses: Not hard at all  Food Insecurity: No Food Insecurity (01/23/2022)   Hunger Vital Sign    Worried About Running Out of Food in the Last Year: Never true    Ran Out of Food in the Last Year: Never true  Transportation Needs: No Transportation Needs (01/23/2022)   PRAPARE - Hydrologist (Medical): No    Lack of Transportation (Non-Medical): No  Physical Activity: Inactive (01/23/2022)   Exercise Vital Sign    Days of Exercise per Week: 0 days    Minutes of Exercise per Session: 0 min  Stress: No Stress Concern Present (01/23/2022)   [place]    Feeling of Stress : Not at all  Social Connections: Moderately Integrated (01/23/2022)   Social Connection and Isolation Panel [NHANES]    Frequency of Communication with Friends and Family: More than three times a week    Frequency of Social Gatherings with Friends and Family: Once a week    Attends Religious Services: 1 to 4 times per year    Active Member of [employer] or Organizations: No     Attends Archivist Meetings: Never    Marital Status: Married  Human resources officer Violence: Not At Risk (11/10/2021)   Humiliation, Afraid, Rape, and Kick questionnaire    Fear of Current or Ex-Partner: No    Emotionally Abused: No    Physically Abused: No    Sexually Abused: No    Review of Systems  Respiratory:  Positive for apnea. Negative for shortness of breath.   Psychiatric/Behavioral:  Positive for sleep disturbance.     Vitals:   02/17/22 1058  BP: 130/76  Pulse: 73  SpO2: 94%     Physical Exam Constitutional:      Appearance: She is obese.  HENT:     Head: Normocephalic.     Mouth/Throat:     Mouth: Mucous membranes are moist.  Eyes:     General: No scleral icterus.    Pupils: Pupils are equal, round, and reactive to light.  Cardiovascular:     Rate and Rhythm: Normal rate and regular rhythm.     Heart sounds: No murmur heard.    No friction rub.  Pulmonary:     Effort: No respiratory distress.     Breath sounds: No stridor. No wheezing or rhonchi.  Musculoskeletal:     Cervical back: No rigidity or tenderness.  Neurological:     Mental Status: She is alert.  Psychiatric:        Mood and Affect: Mood normal.      Data Reviewed: Most recent PFT 08/07/2019 shows normal pulmonary function test  Most recent [facility] 09/06/2021 shows a Fino level of 32  Polysomnogram from 2014 reveals mild obstructive sleep apnea, was on auto CPAP Over time CPAP has been adjusted and currently just on a CPAP of 6  Assessment:  Obstructive sleep apnea -Well treated with CPAP therapy -Symptoms controlled with CPAP therapy  Recent pneumonia -Will follow-up with a chest x-ray -Lungs sound clear at present  Asthma with uncontrolled symptoms -Has not been able to tolerate any inhalers that this helped her breathe better without causing significant side effects  Plan/Recommendations: Obtain pulmonary function tests  Repeat chest x-ray  Encouraged to  continue using CPAP on a nightly basis  Graded exercise as tolerated  Encouraged to have albuterol available for acute symptoms  Follow-up in 2 to 3 months  Encouraged to call with significant concerns  Sherrilyn Rist MD  Pulmonary and Critical Care 02/17/2022, 11:28 AM  CC: Rochel Brome, MD

## 2022-02-19 DIAGNOSIS — E559 Vitamin D deficiency, unspecified: Secondary | ICD-10-CM | POA: Insufficient documentation

## 2022-02-19 DIAGNOSIS — J4520 Mild intermittent asthma, uncomplicated: Secondary | ICD-10-CM | POA: Insufficient documentation

## 2022-02-19 NOTE — Assessment & Plan Note (Signed)
On singulair. If worsens, will add inhalers.

## 2022-02-20 DIAGNOSIS — M6281 Muscle weakness (generalized): Secondary | ICD-10-CM | POA: Diagnosis not present

## 2022-02-21 ENCOUNTER — Encounter: Payer: Medicare Other | Attending: Acute Care | Admitting: Dietician

## 2022-02-21 ENCOUNTER — Encounter: Payer: Self-pay | Admitting: Dietician

## 2022-02-21 VITALS — Ht 62.0 in | Wt 199.0 lb

## 2022-02-21 DIAGNOSIS — E119 Type 2 diabetes mellitus without complications: Secondary | ICD-10-CM | POA: Diagnosis not present

## 2022-02-21 NOTE — Progress Notes (Signed)
Patient was seen on 02/21/2022 for the first of a series of three diabetes self-management courses at the [facility].  Patient Education Plan per assessed needs and concerns is to attend three course education program for Diabetes Self Management Education.  A1C was 6.2% on 02/01/2022 decreased from 6.5% 01/24/2021.  The following learning objectives were met by the patient during this class: Describe diabetes, types of diabetes and pathophysiology State some common risk factors for diabetes Defines the role of glucose and insulin Describe the relationship between diabetes and cardiovascular and other risks State the members of the Healthcare Team States the rationale for glucose monitoring and when to test State their individual [place] the importance of logging glucose readings and how to interpret the readings Identifies A1C target Explain the correlation between A1c and eAG values State symptoms and treatment of high blood glucose and low blood glucose Explain proper technique for glucose testing and identify proper sharps disposal  Handouts given during class include: How to Thrive:  A Guide for Your Journey with Diabetes by the ADA Meal Plan Card and carbohydrate content list Dietary intake form Low Sodium Flavoring Tips Types of Fats Dining Out Label reading Snack list The diabetes portion plate Diabetes Resources A1c to eAG Conversion Chart Blood Glucose Log Diabetes Recommended Care Schedule Support Group Diabetes Success Plan Core Class Satisfaction Survey   Follow-Up Plan: Attend core 2

## 2022-02-22 DIAGNOSIS — M6281 Muscle weakness (generalized): Secondary | ICD-10-CM | POA: Diagnosis not present

## 2022-02-27 ENCOUNTER — Encounter: Payer: Self-pay | Admitting: Cardiology

## 2022-02-27 DIAGNOSIS — M6281 Muscle weakness (generalized): Secondary | ICD-10-CM | POA: Diagnosis not present

## 2022-02-28 ENCOUNTER — Encounter: Payer: Medicare Other | Admitting: Dietician

## 2022-02-28 ENCOUNTER — Encounter: Payer: Self-pay | Admitting: Dietician

## 2022-02-28 DIAGNOSIS — E119 Type 2 diabetes mellitus without complications: Secondary | ICD-10-CM

## 2022-02-28 NOTE — Progress Notes (Signed)
Patient was seen on 02/28/22 for the second of a series of three diabetes self-management courses at the [facility]. The following learning objectives were met by the patient during this class:  Describe the role of different macronutrients on glucose Explain how carbohydrates affect blood glucose State what foods contain the most carbohydrates Demonstrate carbohydrate counting Demonstrate how to read Nutrition Facts food label Describe effects of various fats on heart health Describe the importance of good nutrition for health and healthy eating strategies Describe techniques for managing your shopping, cooking and meal planning List strategies to follow meal plan when dining out Describe the effects of alcohol on glucose and how to use it safely  Goals:  Follow Diabetes Meal Plan as instructed  Aim to spread carbs evenly throughout the day  Aim for 3 meals per day and snacks as needed Include lean protein foods to meals/snacks  Monitor glucose levels as instructed by your doctor   Follow-Up Plan: Attend Core 3 Work towards following your personal food plan.

## 2022-03-01 DIAGNOSIS — M6281 Muscle weakness (generalized): Secondary | ICD-10-CM | POA: Diagnosis not present

## 2022-03-06 DIAGNOSIS — M6281 Muscle weakness (generalized): Secondary | ICD-10-CM | POA: Diagnosis not present

## 2022-03-07 ENCOUNTER — Encounter: Payer: Self-pay | Admitting: Dietician

## 2022-03-07 ENCOUNTER — Encounter: Payer: Medicare Other | Admitting: Dietician

## 2022-03-07 DIAGNOSIS — E119 Type 2 diabetes mellitus without complications: Secondary | ICD-10-CM

## 2022-03-07 NOTE — Progress Notes (Signed)
Patient was seen on 03/07/2022 for the third of a series of three diabetes self-management courses at the [facility].   State the amount of activity recommended for healthy living Describe activities suitable for individual needs Identify ways to regularly incorporate activity into daily life Identify barriers to activity and ways to over come these barriers Identify diabetes medications being personally used and their primary action for lowering glucose and possible side effects Describe role of stress on blood glucose and develop strategies to address psychosocial issues Identify diabetes complications and ways to prevent them Explain how to manage diabetes during illness Evaluate success in meeting personal goal Establish 2-3 goals that they will plan to diligently work on  Goals:  I will count my carb choices at most meals and snacks I will be active 30 minutes or more 5 times a week I will eat less unhealthy fats by eating less overall fat  Your patient has identified these potential barriers to change:  Motivation  Your patient has identified their diabetes self-care support plan as  Family Support   Plan:  Attend Support Group as desired

## 2022-03-08 DIAGNOSIS — M6281 Muscle weakness (generalized): Secondary | ICD-10-CM | POA: Diagnosis not present

## 2022-03-13 DIAGNOSIS — M6281 Muscle weakness (generalized): Secondary | ICD-10-CM | POA: Diagnosis not present

## 2022-03-15 DIAGNOSIS — M6281 Muscle weakness (generalized): Secondary | ICD-10-CM | POA: Diagnosis not present

## 2022-03-18 ENCOUNTER — Other Ambulatory Visit: Payer: Self-pay | Admitting: Family Medicine

## 2022-03-20 DIAGNOSIS — M6281 Muscle weakness (generalized): Secondary | ICD-10-CM | POA: Diagnosis not present

## 2022-03-22 ENCOUNTER — Other Ambulatory Visit: Payer: Self-pay

## 2022-03-22 DIAGNOSIS — M6281 Muscle weakness (generalized): Secondary | ICD-10-CM | POA: Diagnosis not present

## 2022-03-22 MED ORDER — AMITRIPTYLINE HCL 10 MG PO TABS: 10.0000 mg | ORAL_TABLET | Freq: Every day | ORAL | 2 refills | Status: DC

## 2022-03-23 DIAGNOSIS — M6281 Muscle weakness (generalized): Secondary | ICD-10-CM | POA: Diagnosis not present

## 2022-03-27 DIAGNOSIS — M6281 Muscle weakness (generalized): Secondary | ICD-10-CM | POA: Diagnosis not present

## 2022-03-28 DIAGNOSIS — M6281 Muscle weakness (generalized): Secondary | ICD-10-CM | POA: Diagnosis not present

## 2022-03-30 DIAGNOSIS — M6281 Muscle weakness (generalized): Secondary | ICD-10-CM | POA: Diagnosis not present

## 2022-04-03 DIAGNOSIS — M6281 Muscle weakness (generalized): Secondary | ICD-10-CM | POA: Diagnosis not present

## 2022-04-04 DIAGNOSIS — M6281 Muscle weakness (generalized): Secondary | ICD-10-CM | POA: Diagnosis not present

## 2022-04-05 DIAGNOSIS — Z961 Presence of intraocular lens: Secondary | ICD-10-CM | POA: Diagnosis not present

## 2022-04-05 DIAGNOSIS — Z9849 Cataract extraction status, unspecified eye: Secondary | ICD-10-CM | POA: Diagnosis not present

## 2022-04-05 DIAGNOSIS — H524 Presbyopia: Secondary | ICD-10-CM | POA: Diagnosis not present

## 2022-04-05 DIAGNOSIS — E119 Type 2 diabetes mellitus without complications: Secondary | ICD-10-CM | POA: Diagnosis not present

## 2022-04-05 DIAGNOSIS — H35343 Macular cyst, hole, or pseudohole, bilateral: Secondary | ICD-10-CM | POA: Diagnosis not present

## 2022-04-05 DIAGNOSIS — H5203 Hypermetropia, bilateral: Secondary | ICD-10-CM | POA: Diagnosis not present

## 2022-04-05 DIAGNOSIS — H52223 Regular astigmatism, bilateral: Secondary | ICD-10-CM | POA: Diagnosis not present

## 2022-04-05 LAB — HM DIABETES EYE EXAM

## 2022-04-06 DIAGNOSIS — M6281 Muscle weakness (generalized): Secondary | ICD-10-CM | POA: Diagnosis not present

## 2022-04-11 DIAGNOSIS — M6281 Muscle weakness (generalized): Secondary | ICD-10-CM | POA: Diagnosis not present

## 2022-04-12 DIAGNOSIS — Z1231 Encounter for screening mammogram for malignant neoplasm of breast: Secondary | ICD-10-CM | POA: Diagnosis not present

## 2022-04-12 LAB — HM MAMMOGRAPHY

## 2022-04-13 DIAGNOSIS — M6281 Muscle weakness (generalized): Secondary | ICD-10-CM | POA: Diagnosis not present

## 2022-04-13 LAB — HM DEXA SCAN

## 2022-04-14 ENCOUNTER — Encounter: Payer: Self-pay | Admitting: Family Medicine

## 2022-04-17 ENCOUNTER — Encounter: Payer: Self-pay | Admitting: Family Medicine

## 2022-04-18 DIAGNOSIS — M6281 Muscle weakness (generalized): Secondary | ICD-10-CM | POA: Diagnosis not present

## 2022-04-18 NOTE — Progress Notes (Unsigned)
Cardiology Office Note:    Date:  04/19/2022   ID:  Diana Hendrix, DOB October 05, 1945, MRN [id number]  PCP:  Rochel Brome, MD  Cardiologist:  Shirlee More, MD    Referring MD: Rochel Brome, MD    ASSESSMENT:    1. Syncope, vasovagal   2. Mild CAD   3. Agatston coronary artery calcium score less than 100   4. Hypertensive heart disease with chronic diastolic congestive heart failure   5. Stage 3 chronic kidney disease, unspecified whether stage 3a or 3b CKD   6. Mixed hyperlipidemia    PLAN:    In order of problems listed above:  Very concerned about her potential for injury recurrent syncope and told her in my opinion she should not take a tricyclic antidepressant that is contraindicated regardless of dose I think her hypertension is overtreated I was able to get her to agree to use good technique check her blood pressure sitting and standing after medications and send me a list and if her systolics are less than [id number] reduce the dose of her ARB and I asked her not to take her diuretic daily if possible.  Again she is very focused on sitting blood pressure in the morning before medications Stable CAD continue medical therapy having no anginal discomfort and her current lipid-lowering therapy   Next appointment: 6 months   Medication Adjustments/Labs and Tests Ordered: Current medicines are reviewed at length with the patient today.  Concerns regarding medicines are outlined above.  No orders of the defined types were placed in this encounter.  Meds ordered this encounter  Medications   furosemide (LASIX) 20 MG tablet    Sig: Take 1 tablet (20 mg total) by mouth daily.    Dispense:  90 tablet    Refill:  3    Chief Complaint  Patient presents with   Follow-up    History of Present Illness:    Diana Hendrix is a 77 y.o. female with a hx of syncope related to a combination of antihypertensive agents and tricyclic antidepressant hypertensive heart disease with chronic  diastolic heart failure hyperlipidemia and obstructive sleep apnea last seen 02/14/2022.She was admitted to [facility] 11/09/2021 discharged 11/14/2021 with acute respiratory failure and syncope.  Her discharge diagnosis was vasovagal syncope T12 compression fracture nasal x-ray.  Type 2 diabetes hypertension stage III CKD and given a discharge diagnosis of heart failure.   She had a chest x-ray 12/30/2021 showing pneumonia.  Chest x-ray 11/09/2021 did not show findings of heart failure.  She did not have a BNP level performed.   She had another echocardiogram performed 11/10/2021 again showing normal left ventricular systolic function grade 1 diastolic dysfunction usually not seen with heart failure normal right ventricular size and function.  Following that visit cardiac CTA was performed reported 01/26/2022 3 calcium score of 40/48th percentile and minimal obstructive CAD less than 25% calcific plaque left anterior descending coronary artery mild aortic valve calcification.  This is difficult visit, she is back taking a tricyclic antidepressant which I think in her case is contraindicated she is checking blood pressure at home before taking medication sitting posture she is bacteremic on loop diuretic every day and is back on her ARB. Her blood pressure in the office is 100/70 sitting 100/60 standing I think she is at high risk of recurrent hypotension and reduced syncope and recurrent trauma. I do not think she agrees with me but I told her in my opinion Elavil is contraindicated.  I am very concerned about her focus on blood pressure sitting before taking medications and I have asked her for 1 week to check her blood pressure twice a day 1 to 2 hours after meals and in the early evening validated device good technique send copy to make sure we find systolics of less than [id number] I will cut back on her ARB to avoid recurrent syncope and injury. I hope she takes my advice. Past Medical History:   Diagnosis Date   Cancer    Diabetes mellitus without complication    Diastolic heart failure    Echo EF 60/65% Heart monitor was normal   GERD (gastroesophageal reflux disease)    History of colon polyps    Hyperlipidemia    Hypertension    Macular edema, cystoid    OSA (obstructive sleep apnea) 09/17/2012   Osteopenia    Plantar fasciitis 09/09/2019    Past Surgical History:  Procedure Laterality Date   CESAREAN SECTION     COLONOSCOPY  10/29/2013   Colonic polyp status post polypectomy. Mild sigmoid diverticulosis. Small internal hemorrhoids   FOOT NEUROMA SURGERY     s/p surgery in both feet   FOOT SURGERY Bilateral    Per patient   MENISCUS REPAIR Left 09/13/2016   miniscus repair right  Right 2019   SHOULDER SURGERY Left    arthroscopy.    TUBAL LIGATION      Current Medications: Current Meds  Medication Sig   benzonatate (TESSALON) 100 MG capsule Take 2 capsules (200 mg total) by mouth 3 (three) times daily as needed for cough.   Cholecalciferol (VITAMIN D3) 50 MCG (2000 UT) TABS Take 1 tablet by mouth daily.   citalopram (CELEXA) 10 MG tablet Take 1 tablet (10 mg total) by mouth daily.   metoprolol succinate (TOPROL-XL) 50 MG 24 hr tablet Take 25 mg by mouth daily. Take with or immediately following a meal.   montelukast (SINGULAIR) 10 MG tablet TAKE 1 TABLET(10 MG) BY MOUTH AT BEDTIME   NON FORMULARY CPAP at bedtime   omeprazole (PRILOSEC) 40 MG capsule Take 40 mg by mouth daily.   Polyethyl Glycol-Propyl Glycol (SYSTANE OP) Place 1 drop into both eyes 4 (four) times daily.   polyethylene glycol (MIRALAX / GLYCOLAX) 17 g packet Take 17 g by mouth daily.   rosuvastatin (CRESTOR) 40 MG tablet Take 1 tablet (40 mg total) by mouth daily.   traMADol (ULTRAM) 50 MG tablet Take 1 tablet (50 mg total) by mouth every 12 (twelve) hours as needed for moderate pain.   [DISCONTINUED] amitriptyline (ELAVIL) 10 MG tablet Take 1 tablet (10 mg total) by mouth at bedtime.    [DISCONTINUED] furosemide (LASIX) 20 MG tablet Take 20 mg by mouth daily.     Allergies:   Cortisone, Glucosamine, Zyrtec [cetirizine], and Ibuprofen   Social History   Socioeconomic History   Marital status: Married    Spouse name: Transport planner   Number of children: 2   Years of education: Not on file   Highest education level: Not on file  Occupational History   Not on file  Tobacco Use   Smoking status: Never   Smokeless tobacco: Never  Vaping Use   Vaping Use: Never used  Substance and Sexual Activity   Alcohol use: No   Drug use: No   Sexual activity: Not Currently  Other Topics Concern   Not on file  Social History Narrative   Lives with husband   Right handed  Caffeine: 1 cup of tea and 1 pepsi in the AM   Social Determinants of Health   Financial Resource Strain: Low Risk  (01/23/2022)   Overall Financial Resource Strain (CARDIA)    Difficulty of Paying Living Expenses: Not hard at all  Food Insecurity: No Food Insecurity (01/23/2022)   Hunger Vital Sign    Worried About Running Out of Food in the Last Year: Never true    Ran Out of Food in the Last Year: Never true  Transportation Needs: No Transportation Needs (01/23/2022)   PRAPARE - Hydrologist (Medical): No    Lack of Transportation (Non-Medical): No  Physical Activity: Inactive (01/23/2022)   Exercise Vital Sign    Days of Exercise per Week: 0 days    Minutes of Exercise per Session: 0 min  Stress: No Stress Concern Present (01/23/2022)   [place]    Feeling of Stress : Not at all  Social Connections: Moderately Integrated (01/23/2022)   Social Connection and Isolation Panel [NHANES]    Frequency of Communication with Friends and Family: More than three times a week    Frequency of Social Gatherings with Friends and Family: Once a week    Attends Religious Services: 1 to 4 times per year    Active Member of [employer]  or Organizations: No    Attends Music therapist: Never    Marital Status: Married     Family History: The patient's family history includes Atrial fibrillation in her mother; CAD in her mother; CVA in her father; Congestive Heart Failure in her mother; Heart disease in her mother; Hyperlipidemia in her mother; Hypertension in her brother, mother, and son. There is no history of Colon cancer or Esophageal cancer. ROS:   Please see the history of present illness.    All other systems reviewed and are negative.  EKGs/Labs/Other Studies Reviewed:    The following studies were reviewed today:  Cardiac Studies & Procedures     STRESS TESTS  MYOCARDIAL PERFUSION IMAGING 05/14/2019  Narrative  The left ventricular ejection fraction is hyperdynamic (>65%).  Nuclear stress EF: 73%.  There was no ST segment deviation noted during stress.  The study is normal.   ECHOCARDIOGRAM  ECHOCARDIOGRAM COMPLETE 11/10/2021  Narrative ECHOCARDIOGRAM REPORT    Patient Name:   AUBREN VACCARELLO Murch Date of Exam: 11/10/2021 Medical Rec #:  [id number]        Height:       62.0 in Accession #:    [id number]       Weight:       203.0 lb Date of Birth:  Feb 25, 1945        BSA:          1.924 m Patient Age:    16 years         BP:           115/69 mmHg Patient Gender: F                HR:           77 bpm. Exam Location:  Inpatient  Procedure: 2D Echo, Color Doppler and Cardiac Doppler  Indications:    syncope  History:        Patient has prior history of Echocardiogram examinations, most recent 05/07/2019. CHF; Risk Factors:Dyslipidemia, Hypertension, Sleep Apnea and Diabetes.  Sonographer:    Melissa Morford RDCS (AE, PE) Referring Phys: [id number] Orma Flaming  IMPRESSIONS   1. Left ventricular ejection fraction, by estimation, is 60 to 65%. The left ventricle has normal function. The left ventricle has no regional wall motion abnormalities. Left ventricular diastolic parameters  are consistent with Grade I diastolic dysfunction (impaired relaxation). 2. Right ventricular systolic function is normal. The right ventricular size is not well visualized. There is mildly elevated pulmonary artery systolic pressure. 3. No evidence of mitral valve regurgitation. 4. Aortic valve regurgitation is not visualized. Aortic valve sclerosis/calcification is present, without any evidence of aortic stenosis. 5. The inferior vena cava is normal in size with greater than 50% respiratory variability, suggesting right atrial pressure of 3 mmHg.  Comparison(s): No significant change from prior study.  FINDINGS Left Ventricle: Left ventricular ejection fraction, by estimation, is 60 to 65%. The left ventricle has normal function. The left ventricle has no regional wall motion abnormalities. The left ventricular internal cavity size was normal in size. There is no left ventricular hypertrophy. Left ventricular diastolic parameters are consistent with Grade I diastolic dysfunction (impaired relaxation).  Right Ventricle: The right ventricular size is not well visualized. Right ventricular systolic function is normal. There is mildly elevated pulmonary artery systolic pressure. The tricuspid regurgitant velocity is 2.89 m/s, and with an assumed right atrial pressure of 3 mmHg, the estimated right ventricular systolic pressure is [id number] mmHg.  Left Atrium: Left atrial size was normal in size.  Right Atrium: Right atrial size was normal in size.  Pericardium: There is no evidence of pericardial effusion. Presence of epicardial fat layer.  Mitral Valve: Mild to moderate mitral annular calcification. No evidence of mitral valve regurgitation.  Tricuspid Valve: Tricuspid valve regurgitation is trivial.  Aortic Valve: Aortic valve regurgitation is not visualized. Aortic valve sclerosis/calcification is present, without any evidence of aortic stenosis.  Pulmonic Valve: The pulmonic valve was not  well visualized. Pulmonic valve regurgitation is trivial.  Aorta: The aortic root and ascending aorta are structurally normal, with no evidence of dilitation.  Venous: The inferior vena cava is normal in size with greater than 50% respiratory variability, suggesting right atrial pressure of 3 mmHg.  IAS/Shunts: The interatrial septum was not well visualized.   LEFT VENTRICLE PLAX 2D LVIDd:         4.70 cm   Diastology LVIDs:         2.90 cm   LV e' medial:    10.00 cm/s LV PW:         1.00 cm   LV E/e' medial:  9.6 LV IVS:        0.80 cm   LV e' lateral:   13.10 cm/s LVOT diam:     1.70 cm   LV E/e' lateral: 7.3 LV SV:         48 LV SV Index:   25 LVOT Area:     2.27 cm   RIGHT VENTRICLE             IVC RV S prime:     13.60 cm/s  IVC diam: 1.70 cm TAPSE (M-mode): 2.6 cm  LEFT ATRIUM             Index LA diam:        3.30 cm 1.72 cm/m LA Vol (A2C):   37.9 ml 19.70 ml/m LA Vol (A4C):   43.8 ml 22.77 ml/m LA Biplane Vol: 42.9 ml 22.30 ml/m AORTIC VALVE LVOT Vmax:   117.00 cm/s LVOT Vmean:  74.000 cm/s LVOT VTI:    0.211 m  AORTA Ao Root diam: 3.00 cm Ao Asc diam:  3.00 cm  MITRAL VALVE                TRICUSPID VALVE MV Area (PHT): 3.37 cm     TR Peak grad:   33.4 mmHg MV Decel Time: 225 msec     TR Vmax:        289.00 cm/s MV E velocity: 95.50 cm/s MV A velocity: 108.00 cm/s  SHUNTS MV E/A ratio:  0.88         Systemic VTI:  0.21 m Systemic Diam: 1.70 cm  Phineas Inches Electronically signed by Phineas Inches Signature Date/Time: 11/10/2021/6:08:11 PM    Final    MONITORS  LONG TERM MONITOR (3-14 DAYS) 08/06/2019  Narrative A ZIO monitor was performed for 2 days and 21 hours beginning 07/18/2019 to assess palpitation.  The cardiac rhythm throughout was sinus with average minimum and maximum heart rates of 63, 49 and 117 bpm.  The minimum rate was sinus bradycardia.  First-degree AV block is noted.  There were no pauses of 3 seconds or greater and no  episodes of second or third-degree AV node block or sinus node exit block.  Ventricular ectopy was rare with isolated PVCs.  Supraventricular ectopy was rare and there were no episodes of atrial fibrillation, flutter or SVT.  There were no triggered or diary events.   Conclusion normal 3-day ZIO monitor without clinically significant arrhythmia.   CT SCANS  CT CORONARY MORPH W/CTA COR W/SCORE 01/26/2022  Addendum 01/26/2022  2:35 PM ADDENDUM REPORT: 01/26/2022 14:33  EXAM: OVER-READ INTERPRETATION  CT CHEST  The following report is an over-read performed by radiologist Dr. Sabino Dick [facility] on 01/26/2022. This over-read does not include interpretation of cardiac or coronary anatomy or pathology. The coronary CTA interpretation by the cardiologist is attached.  COMPARISON:  None.  FINDINGS: The visualized portions of the extracardiac vascular structures are unremarkable. Visualized mediastinum is unremarkable. Visualized portion of upper abdomen is unremarkable. Visualized pulmonary parenchyma is unremarkable. Visualized skeleton is unremarkable.  IMPRESSION: No definite abnormality seen involving the visualized extracardiac structures of the chest.   Electronically Signed By: Marijo Conception M.D. On: 01/26/2022 14:33  Narrative CLINICAL DATA:  70F with hypertension, OSA, and syncope.  EXAM: Cardiac/Coronary  CT  TECHNIQUE: The patient was scanned on a [employer].  FINDINGS: A 120 kV prospective scan was triggered in the descending thoracic aorta at 111 HU's. Axial non-contrast 3 mm slices were carried out through the heart. The data set was analyzed on a dedicated work station and scored using the [place]. Gantry rotation speed was 250 msecs and collimation was .6 mm. No beta blockade and 0.8 mg of sl NTG was given. The 3D data set was reconstructed in 5% intervals of the 67-82 % of the R-R cycle. Diastolic phases  were analyzed on a dedicated work station using MPR, MIP and VRT modes. The patient received 80 cc of contrast.  Aorta: Normal size.  No calcifications.  No dissection.  Aortic Valve:  Trileaflet.  Mildly calcified.  Coronary Arteries:  Normal coronary origin.  Right dominance.  RCA is a large dominant artery that gives rise to PDA and PLVB. There is no plaque.  Left main is a small artery that gives rise to LAD and LCX arteries.  LAD is a large vessel that has minimal (<25%) calcified plaque. D1 has minimal (<25%) calcified plaque in the ostium.  LCX is  a non-dominant artery that gives rise to a small OM1 and large OM2. There is no plaque.  Coronary Calcium Score:  Left main: 0  Left anterior descending artery: 43.9  Left circumflex artery: 0  Right coronary artery: 0  Total: 43.9  Percentile: 48th  Other findings:  Normal pulmonary vein drainage into the left atrium.  Normal let atrial appendage without a thrombus.  Normal size of the pulmonary artery.  Reconstruction artifact noted.  Mitral annular calcification.  IMPRESSION: 1. Coronary calcium score of 43.9. This was 48th percentile for age-, race-, and sex-matched controls.  2. Normal coronary origin with right dominance.  3. Minimal (<25%) calcified plaque in the LAD.  CAD-RADS 1.  4. Mild aortic valve calcification.  Skeet Latch, MD  Electronically Signed: By: Skeet Latch M.D. On: 01/26/2022 13:49            Recent Labs: 11/09/2021: B Natriuretic Peptide 32.2 02/01/2022: ALT 19; BUN 13; Creatinine, Ser 0.96; Hemoglobin 12.9; Platelets 280; Potassium 4.8; Sodium 139  Recent Lipid Panel    Component Value Date/Time   CHOL 143 02/01/2022 1017   TRIG 182 (H) 02/01/2022 1017   HDL 41 02/01/2022 1017   CHOLHDL 3.5 02/01/2022 1017   CHOLHDL 4 04/24/2019 0913   VLDL 20.6 04/24/2019 0913   LDLCALC 71 02/01/2022 1017    Physical Exam:    VS:  BP 100/62 (BP Location: Right  Arm, Patient Position: Sitting, Cuff Size: Normal)   Pulse 66   Ht 5\' 2"  (1.575 m)   Wt 194 lb (88 kg)   SpO2 91%   BMI 35.48 kg/m     Wt Readings from Last 3 Encounters:  04/19/22 194 lb (88 kg)  02/21/22 199 lb (90.3 kg)  02/17/22 199 lb (90.3 kg)     GEN:  Well nourished, well developed in no acute distress HEENT: Normal NECK: No JVD; No carotid bruits LYMPHATICS: No lymphadenopathy CARDIAC: RRR, no murmurs, rubs, gallops RESPIRATORY:  Clear to auscultation without rales, wheezing or rhonchi  ABDOMEN: Soft, non-tender, non-distended MUSCULOSKELETAL:  No edema; No deformity  SKIN: Warm and dry NEUROLOGIC:  Alert and oriented x 3 PSYCHIATRIC:  Normal affect    Signed, Shirlee More, MD  04/19/2022 11:07 AM    [place]

## 2022-04-19 ENCOUNTER — Ambulatory Visit: Payer: Medicare Other | Attending: Cardiology | Admitting: Cardiology

## 2022-04-19 ENCOUNTER — Encounter: Payer: Self-pay | Admitting: Cardiology

## 2022-04-19 VITALS — BP 100/62 | HR 66 | Ht 62.0 in | Wt 194.0 lb

## 2022-04-19 DIAGNOSIS — I2510 Atherosclerotic heart disease of native coronary artery without angina pectoris: Secondary | ICD-10-CM | POA: Diagnosis not present

## 2022-04-19 DIAGNOSIS — N183 Chronic kidney disease, stage 3 unspecified: Secondary | ICD-10-CM | POA: Insufficient documentation

## 2022-04-19 DIAGNOSIS — R55 Syncope and collapse: Secondary | ICD-10-CM | POA: Insufficient documentation

## 2022-04-19 DIAGNOSIS — I5032 Chronic diastolic (congestive) heart failure: Secondary | ICD-10-CM | POA: Diagnosis not present

## 2022-04-19 DIAGNOSIS — R931 Abnormal findings on diagnostic imaging of heart and coronary circulation: Secondary | ICD-10-CM | POA: Insufficient documentation

## 2022-04-19 DIAGNOSIS — E782 Mixed hyperlipidemia: Secondary | ICD-10-CM

## 2022-04-19 DIAGNOSIS — I110 Hypertensive heart disease with heart failure: Secondary | ICD-10-CM

## 2022-04-19 MED ORDER — FUROSEMIDE 20 MG PO TABS: 20.0000 mg | ORAL_TABLET | Freq: Every day | ORAL | 3 refills | Status: DC

## 2022-04-19 NOTE — Patient Instructions (Signed)
Medication Instructions:  Your physician has recommended you make the following change in your medication:   STOP: Elavil  *If you need a refill on your cardiac medications before your next appointment, please call your pharmacy*   Lab Work: None If you have labs (blood work) drawn today and your tests are completely normal, you will receive your results only by: [place] (if you have MyChart) OR A paper copy in the mail If you have any lab test that is abnormal or we need to change your treatment, we will call you to review the results.   Testing/Procedures: None   Follow-Up: At [facility], you and your health needs are our priority.  As part of our continuing mission to provide you with exceptional heart care, we have created designated Provider Care Teams.  These Care Teams include your primary Cardiologist (physician) and Advanced Practice Providers (APPs -  Physician Assistants and Nurse Practitioners) who all work together to provide you with the care you need, when you need it.  We recommend signing up for the patient portal called "MyChart".  Sign up information is provided on this After Visit Summary.  MyChart is used to connect with patients for Virtual Visits (Telemedicine).  Patients are able to view lab/test results, encounter notes, upcoming appointments, etc.  Non-urgent messages can be sent to your provider as well.   To learn more about what you can do with MyChart, go to [email or web address].    Your next appointment:   6 month(s)  Provider:   Shirlee More, MD    Other Instructions Check blood pressure at midday and at 7 pm both sitting and standing and record.  Send a list of blood pressures via MyChart in 1 week.

## 2022-04-20 DIAGNOSIS — M6281 Muscle weakness (generalized): Secondary | ICD-10-CM | POA: Diagnosis not present

## 2022-04-25 ENCOUNTER — Other Ambulatory Visit: Payer: Self-pay

## 2022-04-25 DIAGNOSIS — J4550 Severe persistent asthma, uncomplicated: Secondary | ICD-10-CM

## 2022-04-25 DIAGNOSIS — M6281 Muscle weakness (generalized): Secondary | ICD-10-CM | POA: Diagnosis not present

## 2022-04-26 ENCOUNTER — Ambulatory Visit (INDEPENDENT_AMBULATORY_CARE_PROVIDER_SITE_OTHER): Payer: Medicare Other | Admitting: Pulmonary Disease

## 2022-04-26 ENCOUNTER — Encounter: Payer: Self-pay | Admitting: Pulmonary Disease

## 2022-04-26 VITALS — BP 102/62 | HR 69 | Ht 62.0 in | Wt 195.2 lb

## 2022-04-26 DIAGNOSIS — G4733 Obstructive sleep apnea (adult) (pediatric): Secondary | ICD-10-CM

## 2022-04-26 DIAGNOSIS — J4550 Severe persistent asthma, uncomplicated: Secondary | ICD-10-CM

## 2022-04-26 DIAGNOSIS — R058 Other specified cough: Secondary | ICD-10-CM | POA: Diagnosis not present

## 2022-04-26 LAB — PULMONARY FUNCTION TEST
DL/VA % pred: 99 %
DL/VA: 4.13 ml/min/mmHg/L
DLCO cor % pred: 89 %
DLCO cor: 16 ml/min/mmHg
DLCO unc % pred: 89 %
DLCO unc: 16 ml/min/mmHg
FEF 25-75 Post: 1.97 L/sec
FEF 25-75 Pre: 1.86 L/sec
FEF2575-%Change-Post: 5 %
FEF2575-%Pred-Post: 127 %
FEF2575-%Pred-Pre: 120 %
FEV1-%Change-Post: 1 %
FEV1-%Pred-Post: 101 %
FEV1-%Pred-Pre: 99 %
FEV1-Post: 1.95 L
FEV1-Pre: 1.92 L
FEV1FVC-%Change-Post: 6 %
FEV1FVC-%Pred-Pre: 109 %
FEV6-%Change-Post: -4 %
FEV6-%Pred-Post: 91 %
FEV6-%Pred-Pre: 95 %
FEV6-Post: 2.24 L
FEV6-Pre: 2.33 L
FEV6FVC-%Change-Post: 0 %
FEV6FVC-%Pred-Post: 105 %
FEV6FVC-%Pred-Pre: 104 %
FVC-%Change-Post: -4 %
FVC-%Pred-Post: 87 %
FVC-%Pred-Pre: 91 %
FVC-Post: 2.24 L
FVC-Pre: 2.35 L
Post FEV1/FVC ratio: 87 %
Post FEV6/FVC ratio: 100 %
Pre FEV1/FVC ratio: 82 %
Pre FEV6/FVC Ratio: 99 %
RV % pred: 92 %
RV: 2.03 L
TLC % pred: 92 %
TLC: 4.4 L

## 2022-04-26 NOTE — Patient Instructions (Signed)
Full PFT performed today. °

## 2022-04-26 NOTE — Progress Notes (Signed)
Full PFT performed today. °

## 2022-04-26 NOTE — Patient Instructions (Signed)
I will see you back in about 6 months  Continue using CPAP nightly  Has your cough is feeling better, I do not think we should do anything different at present Your breathing study looks normal as we reviewed today  CPAP shows that it continues to work well  Call us with any significant concerns otherwise we will see you in 6 months

## 2022-04-26 NOTE — Progress Notes (Signed)
Epimenio SarinCatherine J Morel    [id number]    [birth year]  Primary Care Physician:Cox, Fritzi MandesKirsten, MD  Referring Physician: Blane Oharaox, Kirsten, MD [address] [place] [id number]  Chief complaint:   Patient known to Dr. Craige CottaSood who I am seeing for follow-up History of asthma, obstructive sleep apnea  HPI:   Stable symptoms  Cough is better, no secretions  Tolerating CPAP well at a pressure of 6 She feels the pressure change may have helped as well  Has used multiple courses of antibiotics recently  She continues to improve without any inhalers, multiple inhalers did not help previously  Less congested, no wheezing  Was recently treated for pneumonia with a course of antibiotics  She continues to try to stay active  Not having significant problems with using CPAP  Outpatient Encounter Medications as of 04/26/2022  Medication Sig   benzonatate (TESSALON) 100 MG capsule Take 2 capsules (200 mg total) by mouth 3 (three) times daily as needed for cough.   Cholecalciferol (VITAMIN D3) 50 MCG (2000 UT) TABS Take 1 tablet by mouth daily.   citalopram (CELEXA) 10 MG tablet Take 1 tablet (10 mg total) by mouth daily.   furosemide (LASIX) 20 MG tablet Take 1 tablet (20 mg total) by mouth daily.   metoprolol succinate (TOPROL-XL) 50 MG 24 hr tablet Take 25 mg by mouth daily. Take with or immediately following a meal.   montelukast (SINGULAIR) 10 MG tablet TAKE 1 TABLET(10 MG) BY MOUTH AT BEDTIME   NON FORMULARY CPAP at bedtime   omeprazole (PRILOSEC) 40 MG capsule Take 40 mg by mouth daily.   Polyethyl Glycol-Propyl Glycol (SYSTANE OP) Place 1 drop into both eyes 4 (four) times daily.   polyethylene glycol (MIRALAX / GLYCOLAX) 17 g packet Take 17 g by mouth daily.   rosuvastatin (CRESTOR) 40 MG tablet Take 1 tablet (40 mg total) by mouth daily.   traMADol (ULTRAM) 50 MG tablet Take 1 tablet (50 mg total) by mouth every 12 (twelve) hours as needed for moderate pain.   No  facility-administered encounter medications on file as of 04/26/2022.    Allergies as of 04/26/2022 - Review Complete 04/26/2022  Allergen Reaction Noted   Cortisone Other (See Comments) 04/26/2017   Glucosamine Other (See Comments) 04/16/2017   Zyrtec [cetirizine]  01/04/2021   Ibuprofen Other (See Comments) 08/14/2019    Past Medical History:  Diagnosis Date   Cancer    Diabetes mellitus without complication    Diastolic heart failure    Echo EF 60/65% Heart monitor was normal   GERD (gastroesophageal reflux disease)    History of colon polyps    Hyperlipidemia    Hypertension    Macular edema, cystoid    OSA (obstructive sleep apnea) 09/17/2012   Osteopenia    Plantar fasciitis 09/09/2019    Past Surgical History:  Procedure Laterality Date   CESAREAN SECTION     COLONOSCOPY  10/29/2013   Colonic polyp status post polypectomy. Mild sigmoid diverticulosis. Small internal hemorrhoids   FOOT NEUROMA SURGERY     s/p surgery in both feet   FOOT SURGERY Bilateral    Per patient   MENISCUS REPAIR Left 09/13/2016   miniscus repair right  Right 2019   SHOULDER SURGERY Left    arthroscopy.    TUBAL LIGATION      Family History  Problem Relation Age of Onset   Hyperlipidemia Mother    Heart disease  Mother    Hypertension Mother    Atrial fibrillation Mother    CAD Mother    Congestive Heart Failure Mother    CVA Father    Hypertension Brother    Hypertension Son    Colon cancer Neg Hx    Esophageal cancer Neg Hx     Social History   Socioeconomic History   Marital status: Married    Spouse name: Engineer, production   Number of children: 2   Years of education: Not on file   Highest education level: Not on file  Occupational History   Not on file  Tobacco Use   Smoking status: Never   Smokeless tobacco: Never  Vaping Use   Vaping Use: Never used  Substance and Sexual Activity   Alcohol use: No   Drug use: No   Sexual activity: Not Currently  Other Topics  Concern   Not on file  Social History Narrative   Lives with husband   Right handed   Caffeine: 1 cup of tea and 1 pepsi in the AM   Social Determinants of Health   Financial Resource Strain: Low Risk  (01/23/2022)   Overall Financial Resource Strain (CARDIA)    Difficulty of Paying Living Expenses: Not hard at all  Food Insecurity: No Food Insecurity (01/23/2022)   Hunger Vital Sign    Worried About Running Out of Food in the Last Year: Never true    Ran Out of Food in the Last Year: Never true  Transportation Needs: No Transportation Needs (01/23/2022)   PRAPARE - Administrator, Civil Service (Medical): No    Lack of Transportation (Non-Medical): No  Physical Activity: Inactive (01/23/2022)   Exercise Vital Sign    Days of Exercise per Week: 0 days    Minutes of Exercise per Session: 0 min  Stress: No Stress Concern Present (01/23/2022)   [employer] of Occupational Health - Occupational Stress Questionnaire    Feeling of Stress : Not at all  Social Connections: Moderately Integrated (01/23/2022)   Social Connection and Isolation Panel [NHANES]    Frequency of Communication with Friends and Family: More than three times a week    Frequency of Social Gatherings with Friends and Family: Once a week    Attends Religious Services: 1 to 4 times per year    Active Member of [employer] or Organizations: No    Attends Banker Meetings: Never    Marital Status: Married  Catering manager Violence: Not At Risk (11/10/2021)   Humiliation, Afraid, Rape, and Kick questionnaire    Fear of Current or Ex-Partner: No    Emotionally Abused: No    Physically Abused: No    Sexually Abused: No    Review of Systems  Respiratory:  Positive for apnea and cough. Negative for shortness of breath.   Psychiatric/Behavioral:  Positive for sleep disturbance.     Vitals:   04/26/22 1100  BP: 102/62  Pulse: 69  SpO2: 98%     Physical Exam Constitutional:      Appearance: She  is obese.  HENT:     Head: Normocephalic.     Mouth/Throat:     Mouth: Mucous membranes are moist.  Eyes:     General: No scleral icterus.    Pupils: Pupils are equal, round, and reactive to light.  Cardiovascular:     Rate and Rhythm: Normal rate and regular rhythm.     Heart sounds: No murmur heard.  No friction rub.  Pulmonary:     Effort: No respiratory distress.     Breath sounds: No stridor. No wheezing or rhonchi.  Musculoskeletal:     Cervical back: No rigidity or tenderness.  Neurological:     Mental Status: She is alert.  Psychiatric:        Mood and Affect: Mood normal.      Data Reviewed: Most recent PFT 08/07/2019 shows normal pulmonary function test -Repeat PFT 04/25/2022-within normal limits with no obstruction, no restriction, normal diffusing capacity  Most recent FeNo 09/06/2021 shows a FeNo level of 32  Polysomnogram from 2014 reveals mild obstructive sleep apnea, was on auto CPAP Over time CPAP has been adjusted and currently just on a CPAP of 6  CPAP compliance shows 100% compliance CPAP of 6 Residual AHI of 2.6 Average use of 8.57 hours  PFT was fully reviewed with the patient during the visit, compliance data was reviewed with the patient  Assessment:  Obstructive sleep apnea -Well treated with CPAP therapy -Symptoms controlled with CPAP therapy  Chronic cough -Symptoms continue to improve  Asthma with previously uncontrolled symptoms -She appears to be doing very well without any inhalers at present  Plan/Recommendations:  PFT within normal limits, CPAP appears to be working well  Will not make any changes at the present time  I will see her back in about 6 months  Encouraged to call with significant concerns    Virl Diamond MD [facility] 04/26/2022, 11:14 AM  CC: Blane Ohara, MD

## 2022-04-27 ENCOUNTER — Encounter: Payer: Self-pay | Admitting: Dietician

## 2022-04-27 ENCOUNTER — Encounter: Payer: Medicare Other | Attending: Family Medicine | Admitting: Dietician

## 2022-04-27 VITALS — Wt 195.0 lb

## 2022-04-27 DIAGNOSIS — N1832 Chronic kidney disease, stage 3b: Secondary | ICD-10-CM | POA: Insufficient documentation

## 2022-04-27 DIAGNOSIS — E119 Type 2 diabetes mellitus without complications: Secondary | ICD-10-CM | POA: Diagnosis not present

## 2022-04-27 DIAGNOSIS — M6281 Muscle weakness (generalized): Secondary | ICD-10-CM | POA: Diagnosis not present

## 2022-04-27 NOTE — Progress Notes (Signed)
Appointment start:  1005 Appointment End:  1045  Patient attended Diabetes Core Classes 1-3 between 02/21/2022 and 03/07/2022 at [facility]. The purpose of the meeting today is to review information learned during those classes as well as review patient application and goals.   What are one or two positive things that you are doing right now to manage your diabetes?  Sugar free cookies rather than regular - reduced sweets, reducing portion size, using stevia  What is the hardest part about your diabetes right now, causing you the most concern, or is the most worrisome to you about your diabetes?  Wants to avoid further kidney damage  What questions do you have today?  Diet and kidneys  Have you participated in any diabetes support group?  none  History:  Type 2 Diabetes, OSA on c-pap, HTN, back pain A1C:   Lab Results  Component Value Date   HGBA1C 6.2 (H) 02/01/2022    Medications include:  see list, none for diabetes Sleep:  poor  Weight:   Wt Readings from Last 3 Encounters:  04/27/22 195 lb (88.5 kg)  04/26/22 195 lb 3.2 oz (88.5 kg)  04/19/22 194 lb (88 kg)   Blood Glucose:  does not need to check Social History:  patient lives with her husband Exercise:  PT for back, doing more housework - considering water aerobics class after PT is finished.  24 hour diet recall: Breakfast:  tomato and bacon sandwich on white bread Snack:  none Lunch:  [place] - pimento cheese biscuit Snack:  none Dinner 4 pm:  fried potatoes, hotdogs, onions, strawberries Snack:  occasional sugar free cookie or ritz crackers Beverages:  water, 1 regular decaf Pepsi, decaf tea with stevia (am)  Specific focus but not limited to the following: Review of the effects of physical activity on glucose levels and long-term glucose control.  Recommended goal of 150 minutes of physical activity/week. Discussed benefits for increasing vegetables and fruits - DASH protocol 10  servings per day and to work on increasing these. Diet and renal issues - low sodium, no added phosphorous, limit red meat, small animal protein portions  Continuing Goals: Eat more Non-Starchy Vegetables These include greens, broccoli, cauliflower, cabbage, carrots, beets, eggplant, peppers, squash and others. Fruit 2-3 daily Minimize added sugars and refined grains Rethink what you drink.  Choose beverages without added sugar.  Look for 0 carbs on the label. See the list of whole grains below.  Find alternatives to usual sweet treats. Choose whole foods over processed. Make simple meals at home more often than eating out.  Tips to increase fiber in your diet: (All plants have fiber.  Eat a variety. There are more than are on this list.) Slowly increase the amount of fiber you eat to 25-35 grams per day.  (More is fine if you tolerate it.) Fiber from whole grains, nuts and seeds Quinoa, 1/2 cup = 5 grams Bulgur, 1/2 cup = 4.1 grams Popcorn, 3 cups = 3.6 grams Whole Wheat Spaghetti, 1/2 cup = 3.2 grams Barley, 1/2 cup = 3 grams Oatmeal, 1/2 cup = 2 grams Whole Wheat English Muffin = 3 grams Corn, 1/2 cup = 2.1 grams Brown Rice, 1/2 cup = 1.8 grams Flax seeds, 1 Tablespoon = 2.8 grams Chia seeds, 1 Tablespoon = 11 grams Almonds, 1 ounce = 3.5 grams fiber Fiber from legumes Kidney beans, 1/2 cup 7.9 grams Lentils, 1/2 cup = 7.8 grams Pinto beans, 1/2 cup = 7.7 grams Black beans,  1/2 cup = 7.6 grams Lima beans, 1/2 cup 6.4 grams Chick peas, 1/2 cup = 5.3 grams Black eyed peas, 1/2 cup = 4 grams Fiber from fruits and vegetables Pear, 6 grams Apple. 3.3 grams Raspberries or Blackberries, 3/4 cup = 6 grams Strawberries or Blueberries, 1 cup = 3.4 grams Baked sweet potato 3.8 grams fiber Baked potato with skin 4.4 grams  Peas, 1/2 cup = 4.4 grams  Spinach, 1/2 cup cooked = 3.5 grams  Avocado, 1/2 = 5 grams Handout:  NKD national kidney diet - Dish up a Kidney-Friendly Meal for  Patients with Chronic Kidney Disease (not on dialysis)  Future Follow up:  prn

## 2022-05-01 ENCOUNTER — Encounter: Payer: Self-pay | Admitting: Cardiology

## 2022-05-02 DIAGNOSIS — M6281 Muscle weakness (generalized): Secondary | ICD-10-CM | POA: Diagnosis not present

## 2022-05-04 ENCOUNTER — Other Ambulatory Visit: Payer: Self-pay

## 2022-05-04 DIAGNOSIS — E118 Type 2 diabetes mellitus with unspecified complications: Secondary | ICD-10-CM

## 2022-05-04 DIAGNOSIS — I5032 Chronic diastolic (congestive) heart failure: Secondary | ICD-10-CM

## 2022-05-04 DIAGNOSIS — E782 Mixed hyperlipidemia: Secondary | ICD-10-CM

## 2022-05-04 DIAGNOSIS — M6281 Muscle weakness (generalized): Secondary | ICD-10-CM | POA: Diagnosis not present

## 2022-05-04 DIAGNOSIS — I110 Hypertensive heart disease with heart failure: Secondary | ICD-10-CM

## 2022-05-04 MED ORDER — OLMESARTAN MEDOXOMIL 20 MG PO TABS: 20.0000 mg | ORAL_TABLET | Freq: Every day | ORAL | 3 refills | Status: DC

## 2022-05-05 ENCOUNTER — Ambulatory Visit (INDEPENDENT_AMBULATORY_CARE_PROVIDER_SITE_OTHER): Payer: Medicare Other | Admitting: Family Medicine

## 2022-05-05 ENCOUNTER — Ambulatory Visit: Payer: Medicare Other | Admitting: Family Medicine

## 2022-05-05 ENCOUNTER — Encounter: Payer: Self-pay | Admitting: Family Medicine

## 2022-05-05 VITALS — BP 130/70 | HR 72 | Temp 97.2°F | Ht 62.0 in | Wt 193.0 lb

## 2022-05-05 DIAGNOSIS — E118 Type 2 diabetes mellitus with unspecified complications: Secondary | ICD-10-CM

## 2022-05-05 DIAGNOSIS — G4709 Other insomnia: Secondary | ICD-10-CM

## 2022-05-05 DIAGNOSIS — M545 Low back pain, unspecified: Secondary | ICD-10-CM

## 2022-05-05 DIAGNOSIS — E782 Mixed hyperlipidemia: Secondary | ICD-10-CM

## 2022-05-05 DIAGNOSIS — I5032 Chronic diastolic (congestive) heart failure: Secondary | ICD-10-CM | POA: Diagnosis not present

## 2022-05-05 DIAGNOSIS — I152 Hypertension secondary to endocrine disorders: Secondary | ICD-10-CM | POA: Diagnosis not present

## 2022-05-05 DIAGNOSIS — I110 Hypertensive heart disease with heart failure: Secondary | ICD-10-CM

## 2022-05-05 DIAGNOSIS — E1159 Type 2 diabetes mellitus with other circulatory complications: Secondary | ICD-10-CM | POA: Diagnosis not present

## 2022-05-05 LAB — POCT URINALYSIS DIP (CLINITEK)
Bilirubin, UA: NEGATIVE
Blood, UA: NEGATIVE
Glucose, UA: NEGATIVE mg/dL
Ketones, POC UA: NEGATIVE mg/dL
Leukocytes, UA: NEGATIVE
Nitrite, UA: NEGATIVE
POC PROTEIN,UA: NEGATIVE
Spec Grav, UA: 1.015 (ref 1.010–1.025)
Urobilinogen, UA: 0.2 E.U./dL
pH, UA: 6 (ref 5.0–8.0)

## 2022-05-05 MED ORDER — ESZOPICLONE 2 MG PO TABS: 2.0000 mg | ORAL_TABLET | Freq: Every evening | ORAL | 0 refills | Status: DC | PRN

## 2022-05-05 NOTE — Assessment & Plan Note (Signed)
Well controlled.  No changes to medicines. Rosuvastatin 40 mg daily. Continue to work on eating a healthy diet and exercise.  Labs drawn today.   

## 2022-05-05 NOTE — Progress Notes (Signed)
Subjective:  Patient ID: Diana Hendrix, female    DOB: 09/13/1945  Age: 77 y.o. MRN: [id number]  Chief Complaint  Patient presents with   Diabetes   Gastroesophageal Reflux   Hyperlipidemia   Hypertension    Diabetes:  Complications: hypertension, hyperlipidemia. Glucose checking: She does not check her blood sugar. Hypoglycemia: no Most recent A1C:6.2 Current medications: No medications. Last Eye Exam: 03/2022 Foot checks: daily.   Hypertension: Olmesartan 20 mg daily, Lasix 20 mg daily.   Hyperlipidemia: Takes Rosuvastatin 40 mg daily.   GERD: Currently taking Omeprazole 40 mg daily,    Asthma: Using monteluskast 10 mg 1 tablet at bedtime   OSA: Patient on CPAP. She is doing well. Patient feels rested. Compliant with nightly use.    Depression: Celexa 10 mg daily.  Insomnia: Taking Amitriptyline 10 mg at bedtime-was advised by Cardiology to d/c due to side effect of syncope-pt stopped for 10 days but could not sleep and restarted. Goes to bed at 9 every night. Trouble falling asleep, then will toss and turn     05/05/2022   10:03 AM 05/05/2022    9:33 AM 02/21/2022    3:44 PM 02/01/2022    9:36 AM 01/23/2022    2:06 PM  Depression screen PHQ 2/9  Decreased Interest 0 0 0 0 0  Down, Depressed, Hopeless 0 0 0 0 0  PHQ - 2 Score 0 0 0 0 0  Altered sleeping 0   0   Tired, decreased energy 0   0   Change in appetite 0   0   Feeling bad or failure about yourself  0   0   Trouble concentrating 0   0   Moving slowly or fidgety/restless 0   0   Suicidal thoughts 0   0   PHQ-9 Score 0   0   Difficult doing work/chores Not difficult at all   Not difficult at all          11/13/2021    7:34 AM 11/14/2021   10:00 AM 01/23/2022    1:50 PM 02/21/2022    3:44 PM 05/05/2022    9:33 AM  Fall Risk  Falls in the past year?   1 1 1   Was there an injury with Fall?   1  0  Fall Risk Category Calculator   3  2  Fall Risk Category (Retired)   High    (RETIRED) Patient Fall Risk  Level High fall risk High fall risk Moderate fall risk    Patient at Risk for Falls Due to   History of fall(s);Orthopedic patient  History of fall(s)  Fall risk Follow up   Falls evaluation completed;Education provided  Falls evaluation completed      Review of Systems  Constitutional:  Negative for chills, fatigue and fever.  HENT:  Negative for congestion, ear pain, rhinorrhea and sore throat.   Respiratory:  Negative for cough and shortness of breath.   Cardiovascular:  Negative for chest pain.  Gastrointestinal:  Negative for abdominal pain, constipation, diarrhea, nausea and vomiting.  Genitourinary:  Negative for dysuria and urgency.  Musculoskeletal:  Positive for arthralgias. Negative for back pain and myalgias.  Neurological:  Negative for dizziness, weakness, light-headedness and headaches.  Psychiatric/Behavioral:  Negative for dysphoric mood. The patient is not nervous/anxious.     Current Outpatient Medications on File Prior to Visit  Medication Sig Dispense Refill   Cholecalciferol (VITAMIN D3) 50 MCG (2000 UT) TABS Take 1  tablet by mouth daily.     citalopram (CELEXA) 10 MG tablet Take 1 tablet (10 mg total) by mouth daily. 90 tablet 3   furosemide (LASIX) 20 MG tablet Take 1 tablet (20 mg total) by mouth daily. 90 tablet 3   metoprolol succinate (TOPROL-XL) 50 MG 24 hr tablet Take 25 mg by mouth daily. Take with or immediately following a meal.     montelukast (SINGULAIR) 10 MG tablet TAKE 1 TABLET(10 MG) BY MOUTH AT BEDTIME 90 tablet 3   NON FORMULARY CPAP at bedtime     olmesartan (BENICAR) 20 MG tablet Take 1 tablet (20 mg total) by mouth daily. 90 tablet 3   omeprazole (PRILOSEC) 40 MG capsule Take 40 mg by mouth daily.     Polyethyl Glycol-Propyl Glycol (SYSTANE OP) Place 1 drop into both eyes 4 (four) times daily.     polyethylene glycol (MIRALAX / GLYCOLAX) 17 g packet Take 17 g by mouth daily.     rosuvastatin (CRESTOR) 40 MG tablet Take 1 tablet (40 mg total)  by mouth daily. 90 tablet 1   traMADol (ULTRAM) 50 MG tablet Take 1 tablet (50 mg total) by mouth every 12 (twelve) hours as needed for moderate pain. 10 tablet 0   No current facility-administered medications on file prior to visit.   Past Medical History:  Diagnosis Date   Cancer (HCC)    Diabetes mellitus without complication (HCC)    Diastolic heart failure (HCC)    Echo EF 60/65% Heart monitor was normal   GERD (gastroesophageal reflux disease)    History of colon polyps    Hyperlipidemia    Hypertension    Macular edema, cystoid    OSA (obstructive sleep apnea) 09/17/2012   Osteopenia    Plantar fasciitis 09/09/2019   Past Surgical History:  Procedure Laterality Date   CESAREAN SECTION     COLONOSCOPY  10/29/2013   Colonic polyp status post polypectomy. Mild sigmoid diverticulosis. Small internal hemorrhoids   FOOT NEUROMA SURGERY     s/p surgery in both feet   FOOT SURGERY Bilateral    Per patient   MENISCUS REPAIR Left 09/13/2016   miniscus repair right  Right 2019   SHOULDER SURGERY Left    arthroscopy.    TUBAL LIGATION      Family History  Problem Relation Age of Onset   Hyperlipidemia Mother    Heart disease Mother    Hypertension Mother    Atrial fibrillation Mother    CAD Mother    Congestive Heart Failure Mother    CVA Father    Hypertension Brother    Hypertension Son    Colon cancer Neg Hx    Esophageal cancer Neg Hx    Social History   Socioeconomic History   Marital status: Married    Spouse name: Engineer, production   Number of children: 2   Years of education: Not on file   Highest education level: Not on file  Occupational History   Not on file  Tobacco Use   Smoking status: Never   Smokeless tobacco: Never  Vaping Use   Vaping Use: Never used  Substance and Sexual Activity   Alcohol use: No   Drug use: No   Sexual activity: Not Currently  Other Topics Concern   Not on file  Social History Narrative   Lives with husband   Right  handed   Caffeine: 1 cup of tea and 1 pepsi in the AM   Social Determinants  of Health   Financial Resource Strain: Low Risk  (01/23/2022)   Overall Financial Resource Strain (CARDIA)    Difficulty of Paying Living Expenses: Not hard at all  Food Insecurity: No Food Insecurity (01/23/2022)   Hunger Vital Sign    Worried About Running Out of Food in the Last Year: Never true    Ran Out of Food in the Last Year: Never true  Transportation Needs: No Transportation Needs (01/23/2022)   PRAPARE - Administrator, Civil Service (Medical): No    Lack of Transportation (Non-Medical): No  Physical Activity: Inactive (01/23/2022)   Exercise Vital Sign    Days of Exercise per Week: 0 days    Minutes of Exercise per Session: 0 min  Stress: No Stress Concern Present (01/23/2022)   [employer] of Occupational Health - Occupational Stress Questionnaire    Feeling of Stress : Not at all  Social Connections: Moderately Integrated (01/23/2022)   Social Connection and Isolation Panel [NHANES]    Frequency of Communication with Friends and Family: More than three times a week    Frequency of Social Gatherings with Friends and Family: Once a week    Attends Religious Services: 1 to 4 times per year    Active Member of [employer] or Organizations: No    Attends Engineer, structural: Never    Marital Status: Married    Objective:  BP 130/70   Pulse 72   Temp (!) 97.2 F (36.2 C)   Ht 5\' 2"  (1.575 m)   Wt 193 lb (87.5 kg)   SpO2 96%   BMI 35.30 kg/m      05/05/2022    9:32 AM 04/27/2022   10:20 AM 04/26/2022   11:00 AM  BP/Weight  Systolic BP 130  161  Diastolic BP 70  62  Wt. (Lbs) 193 195 195.2  BMI 35.3 kg/m2 35.67 kg/m2 35.7 kg/m2    Physical Exam Vitals reviewed.  Constitutional:      Appearance: Normal appearance. She is normal weight.  Neck:     Vascular: No carotid bruit.  Cardiovascular:     Rate and Rhythm: Normal rate and regular rhythm.     Heart sounds: Normal  heart sounds.  Pulmonary:     Effort: Pulmonary effort is normal. No respiratory distress.     Breath sounds: Normal breath sounds.  Abdominal:     General: Abdomen is flat. Bowel sounds are normal.     Palpations: Abdomen is soft.     Tenderness: There is no abdominal tenderness.  Musculoskeletal:        General: Tenderness (left flank.) present.  Neurological:     Mental Status: She is alert and oriented to person, place, and time.  Psychiatric:        Mood and Affect: Mood normal.        Behavior: Behavior normal.     Diabetic Foot Exam - Simple   Simple Foot Form Diabetic Foot exam was performed with the following findings: Yes 05/05/2022  9:45 AM  Visual Inspection No deformities, no ulcerations, no other skin breakdown bilaterally: Yes Sensation Testing Intact to touch and monofilament testing bilaterally: Yes Pulse Check Posterior Tibialis and Dorsalis pulse intact bilaterally: Yes Comments      Lab Results  Component Value Date   WBC 6.5 05/05/2022   HGB 12.8 05/05/2022   HCT 38.4 05/05/2022   PLT 262 05/05/2022   GLUCOSE 104 (H) 05/05/2022   CHOL 154 05/05/2022  TRIG 196 (H) 05/05/2022   HDL 47 05/05/2022   LDLCALC 74 05/05/2022   ALT 16 05/05/2022   AST 24 05/05/2022   NA 138 05/05/2022   K 4.1 05/05/2022   CL 98 05/05/2022   CREATININE 1.12 (H) 05/05/2022   BUN 17 05/05/2022   CO2 24 05/05/2022   TSH 1.810 02/09/2021   INR 1.1 11/09/2021   HGBA1C 6.3 (H) 05/05/2022      Assessment & Plan:    Acute left-sided low back pain without sciatica Assessment & Plan: Check UA.  Orders: -     POCT URINALYSIS DIP (CLINITEK)  Hypertensive heart disease with chronic diastolic congestive heart failure (HCC) Assessment & Plan: Well controlled.  No changes to medicines. Olmesartan 20 mg daily, Lasix 20 mg daily.  Continue to work on eating a healthy diet and exercise.  Labs drawn today.    Orders: -     Comprehensive metabolic panel  Diabetes  mellitus type 2 with complications [facility]) Assessment & Plan: Control: Well controlled Recommend check sugars fasting daily. Recommend check feet daily. Recommend annual eye exams. Medicines: None Continue to work on eating a healthy diet and exercise.  Labs drawn today.     Orders: -     CBC with Differential/Platelet -     Hemoglobin A1c  Mixed hyperlipidemia Assessment & Plan: Well controlled.  No changes to medicines. Rosuvastatin 40 mg daily.  Continue to work on eating a healthy diet and exercise.  Labs drawn today.    Orders: -     Lipid panel  Hypertension associated with diabetes [facility]) Assessment & Plan: Well controlled.  No medication changes recommended. Continue healthy diet and exercise.   Labs ordered   Other insomnia Assessment & Plan: Discontinue amitriptyline  Start lunesta 2 mg qhs  Orders: -     Eszopiclone; Take 1 tablet (2 mg total) by mouth at bedtime as needed for sleep. Take immediately before bedtime  Dispense: 30 tablet; Refill: 0  Other orders -     Cardiovascular Risk Assessment     Meds ordered this encounter  Medications   eszopiclone (LUNESTA) 2 MG TABS tablet    Sig: Take 1 tablet (2 mg total) by mouth at bedtime as needed for sleep. Take immediately before bedtime    Dispense:  30 tablet    Refill:  0    Orders Placed This Encounter  Procedures   CBC with Differential/Platelet   Comprehensive metabolic panel   Lipid panel   Hemoglobin A1c   Cardiovascular Risk Assessment   POCT URINALYSIS DIP (CLINITEK)     Follow-up: Return in about 3 months (around 08/04/2022) for chronic, fasting.   I,Marla I Leal-Borjas,acting as a scribe for Blane Ohara, MD.,have documented all relevant documentation on the behalf of Blane Ohara, MD,as directed by  Blane Ohara, MD while in the presence of Blane Ohara, MD.    An After Visit Summary was printed and given to the patient.  I attest that I have reviewed this visit and agree with the  plan scribed by my staff.   Blane Ohara, MD [facility] [phone]

## 2022-05-05 NOTE — Assessment & Plan Note (Signed)
Discontinue amitriptyline  Start lunesta 2 mg qhs

## 2022-05-05 NOTE — Assessment & Plan Note (Signed)
Well controlled.  No medication changes recommended. Continue healthy diet and exercise.   Labs ordered

## 2022-05-06 DIAGNOSIS — M545 Low back pain, unspecified: Secondary | ICD-10-CM | POA: Insufficient documentation

## 2022-05-06 DIAGNOSIS — E118 Type 2 diabetes mellitus with unspecified complications: Secondary | ICD-10-CM | POA: Insufficient documentation

## 2022-05-06 LAB — LIPID PANEL
Chol/HDL Ratio: 3.3 ratio (ref 0.0–4.4)
Cholesterol, Total: 154 mg/dL (ref 100–199)
HDL: 47 mg/dL (ref >39)
LDL Chol Calc (NIH): 74 mg/dL (ref 0–99)
Triglycerides: 196 mg/dL — ABNORMAL HIGH (ref 0–149)
VLDL Cholesterol Cal: 33 mg/dL (ref 5–40)

## 2022-05-06 LAB — HEMOGLOBIN A1C
Est. average glucose Bld gHb Est-mCnc: 134 mg/dL
Hgb A1c MFr Bld: 6.3 % — ABNORMAL HIGH (ref 4.8–5.6)

## 2022-05-06 LAB — CBC WITH DIFFERENTIAL/PLATELET
Basophils Absolute: 0 10*3/uL (ref 0.0–0.2)
Basos: 1 %
EOS (ABSOLUTE): 0.2 10*3/uL (ref 0.0–0.4)
Eos: 4 %
Hematocrit: 38.4 % (ref 34.0–46.6)
Hemoglobin: 12.8 g/dL (ref 11.1–15.9)
Immature Grans (Abs): 0 10*3/uL (ref 0.0–0.1)
Immature Granulocytes: 0 %
Lymphocytes Absolute: 2.3 10*3/uL (ref 0.7–3.1)
Lymphs: 35 %
MCH: 29.7 pg (ref 26.6–33.0)
MCHC: 33.3 g/dL (ref 31.5–35.7)
MCV: 89 fL (ref 79–97)
Monocytes Absolute: 0.5 10*3/uL (ref 0.1–0.9)
Monocytes: 8 %
Neutrophils Absolute: 3.4 10*3/uL (ref 1.4–7.0)
Neutrophils: 52 %
Platelets: 262 10*3/uL (ref 150–450)
RBC: 4.31 x10E6/uL (ref 3.77–5.28)
RDW: 13.8 % (ref 11.7–15.4)
WBC: 6.5 10*3/uL (ref 3.4–10.8)

## 2022-05-06 LAB — COMPREHENSIVE METABOLIC PANEL
ALT: 16 IU/L (ref 0–32)
AST: 24 IU/L (ref 0–40)
Albumin/Globulin Ratio: 1.8 (ref 1.2–2.2)
Albumin: 4.7 g/dL (ref 3.8–4.8)
Alkaline Phosphatase: 95 IU/L (ref 44–121)
BUN/Creatinine Ratio: 15 (ref 12–28)
BUN: 17 mg/dL (ref 8–27)
Bilirubin Total: 0.5 mg/dL (ref 0.0–1.2)
CO2: 24 mmol/L (ref 20–29)
Calcium: 10 mg/dL (ref 8.7–10.3)
Chloride: 98 mmol/L (ref 96–106)
Creatinine, Ser: 1.12 mg/dL — ABNORMAL HIGH (ref 0.57–1.00)
Globulin, Total: 2.6 g/dL (ref 1.5–4.5)
Glucose: 104 mg/dL — ABNORMAL HIGH (ref 70–99)
Potassium: 4.1 mmol/L (ref 3.5–5.2)
Sodium: 138 mmol/L (ref 134–144)
Total Protein: 7.3 g/dL (ref 6.0–8.5)
eGFR: 51 mL/min/{1.73_m2} — ABNORMAL LOW (ref >59)

## 2022-05-06 LAB — CARDIOVASCULAR RISK ASSESSMENT

## 2022-05-06 NOTE — Assessment & Plan Note (Signed)
Well controlled.  No changes to medicines. Olmesartan 20 mg daily, Lasix 20 mg daily.  Continue to work on eating a healthy diet and exercise.  Labs drawn today.

## 2022-05-06 NOTE — Assessment & Plan Note (Signed)
Check UA 

## 2022-05-06 NOTE — Assessment & Plan Note (Signed)
Control: Well controlled Recommend check sugars fasting daily. Recommend check feet daily. Recommend annual eye exams. Medicines: None Continue to work on eating a healthy diet and exercise.  Labs drawn today.

## 2022-05-09 DIAGNOSIS — M6281 Muscle weakness (generalized): Secondary | ICD-10-CM | POA: Diagnosis not present

## 2022-05-10 ENCOUNTER — Ambulatory Visit: Payer: Medicare Other | Admitting: Family Medicine

## 2022-05-11 DIAGNOSIS — M6281 Muscle weakness (generalized): Secondary | ICD-10-CM | POA: Diagnosis not present

## 2022-05-28 ENCOUNTER — Encounter: Payer: Self-pay | Admitting: Family Medicine

## 2022-05-29 ENCOUNTER — Other Ambulatory Visit: Payer: Self-pay

## 2022-05-29 MED ORDER — OMEGA-3-ACID ETHYL ESTERS 1 G PO CAPS: 2.0000 g | ORAL_CAPSULE | Freq: Two times a day (BID) | ORAL | 2 refills | Status: DC

## 2022-07-17 DIAGNOSIS — M25511 Pain in right shoulder: Secondary | ICD-10-CM | POA: Diagnosis not present

## 2022-07-22 ENCOUNTER — Other Ambulatory Visit: Payer: Self-pay | Admitting: Family Medicine

## 2022-08-12 ENCOUNTER — Other Ambulatory Visit: Payer: Self-pay | Admitting: Family Medicine

## 2022-08-12 DIAGNOSIS — J301 Allergic rhinitis due to pollen: Secondary | ICD-10-CM

## 2022-08-24 NOTE — Assessment & Plan Note (Signed)
Control: Well controlled Recommend check sugars fasting daily. Recommend check feet daily. Recommend annual eye exams. Medicines: None Continue to work on eating a healthy diet and exercise.  Labs drawn today.

## 2022-08-24 NOTE — Assessment & Plan Note (Signed)
Well controlled.  No changes to medicines. Olmesartan 20 mg daily, Lasix 20 mg daily.  Continue to work on eating a healthy diet and exercise.  Labs drawn today.

## 2022-08-24 NOTE — Assessment & Plan Note (Addendum)
Well controlled.  No medication changes recommended.  Continue furosemide 20 mg once daily. Olmesartan 20 mg daily Continue healthy diet and exercise.   Labs ordered

## 2022-08-24 NOTE — Progress Notes (Signed)
Subjective:  Patient ID: Diana Hendrix, female    DOB: 05/06/1945  Age: 77 y.o. MRN: [id number]  Chief Complaint  Patient presents with   Medical Management of Chronic Issues    HPI Diabetes:  Complications: hypertension, hyperlipidemia. Glucose checking: She does not check her blood sugar. Hypoglycemia: no Most recent A1C:6.3 Current medications: No medications. Last Eye Exam: UTD Foot checks: daily.  Hypertension:furosemide 20 mg once daily. Olmesartan 20 mg daily  Hyperlipidemia: Takes Rosuvastatin 40 mg daily, lovaza 2g BID  GERD: Currently taking Omeprazole 40 mg daily.  Asthma: Using monteluskast 10 mg 1 tablet at bedtime, I  OSA: Patient on CPAP. She is doing well. Patient feels rested. Compliant with nightly use.  Lunesta not melatonin 10 mg does not help with sleeping. Amitriptyline d/c by Dr.Munley, Trazadone not recommended to take with Celexa.   Depression: Celexa 10 mg daily.   Eating healthy Exercising 3-4 times at [facility] (water aerobics) Last AWV 01/08/224    08/25/2022    8:39 AM 05/05/2022   10:03 AM 05/05/2022    9:33 AM 02/21/2022    3:44 PM 02/01/2022    9:36 AM  Depression screen PHQ 2/9  Decreased Interest 0 0 0 0 0  Down, Depressed, Hopeless 0 0 0 0 0  PHQ - 2 Score 0 0 0 0 0  Altered sleeping 3 0   0  Tired, decreased energy 0 0   0  Change in appetite 0 0   0  Feeling bad or failure about yourself  0 0   0  Trouble concentrating 0 0   0  Moving slowly or fidgety/restless 0 0   0  Suicidal thoughts 0 0   0  PHQ-9 Score 3 0   0  Difficult doing work/chores Somewhat difficult Not difficult at all   Not difficult at all        08/25/2022    8:39 AM  Fall Risk   Falls in the past year? 1  Number falls in past yr: 0  Injury with Fall? 1  Risk for fall due to : No Fall Risks  Follow up Falls evaluation completed  Comment fell 10/2021    Patient Care Team: Blane Ohara, MD as PCP - General (Family Medicine) Sinda Du, MD as  Consulting Physician (Ophthalmology) Coralyn Helling, MD as Consulting Physician (Pulmonary Disease) Baldo Daub, MD as Consulting Physician (Cardiology) Lynann Bologna, MD as Consulting Physician (Gastroenterology)   Review of Systems  Constitutional:  Negative for chills, fatigue and fever.  HENT:  Negative for congestion, ear pain, rhinorrhea and sore throat.   Respiratory:  Negative for cough and shortness of breath.   Cardiovascular:  Negative for chest pain.  Gastrointestinal:  Negative for abdominal pain, constipation, diarrhea, nausea and vomiting.  Genitourinary:  Negative for dysuria and urgency.  Musculoskeletal:  Positive for back pain. Negative for myalgias.       Right arm pain  Neurological:  Negative for dizziness, weakness, light-headedness and headaches.  Psychiatric/Behavioral:  Negative for dysphoric mood. The patient is not nervous/anxious.     Current Outpatient Medications on File Prior to Visit  Medication Sig Dispense Refill   Cholecalciferol (VITAMIN D3) 50 MCG (2000 UT) TABS Take 1 tablet by mouth daily.     citalopram (CELEXA) 10 MG tablet Take 1 tablet (10 mg total) by mouth daily. 90 tablet 3   furosemide (LASIX) 20 MG tablet Take 1 tablet (20 mg total) by mouth daily. 90 tablet 3  metoprolol succinate (TOPROL-XL) 50 MG 24 hr tablet TAKE 1 AND 1/2 TABLETS(75 MG) BY MOUTH DAILY WITH OR IMMEDIATELY FOLLOWING A MEAL (Patient taking differently: 25 mg.) 90 tablet 0   montelukast (SINGULAIR) 10 MG tablet TAKE 1 TABLET(10 MG) BY MOUTH AT BEDTIME 90 tablet 1   NON FORMULARY CPAP at bedtime     olmesartan (BENICAR) 20 MG tablet Take 1 tablet (20 mg total) by mouth daily. 90 tablet 3   omega-3 acid ethyl esters (LOVAZA) 1 g capsule Take 2 capsules (2 g total) by mouth 2 (two) times daily. 360 capsule 2   omeprazole (PRILOSEC) 40 MG capsule Take 40 mg by mouth daily.     Polyethyl Glycol-Propyl Glycol (SYSTANE OP) Place 1 drop into both eyes 4 (four) times daily.      polyethylene glycol (MIRALAX / GLYCOLAX) 17 g packet Take 17 g by mouth daily.     rosuvastatin (CRESTOR) 40 MG tablet Take 1 tablet (40 mg total) by mouth daily. 90 tablet 1   No current facility-administered medications on file prior to visit.   Past Medical History:  Diagnosis Date   Cancer (HCC)    Diabetes mellitus without complication (HCC)    Diastolic heart failure (HCC)    Echo EF 60/65% Heart monitor was normal   GERD (gastroesophageal reflux disease)    History of colon polyps    Hyperlipidemia    Hypertension    Macular edema, cystoid    OSA (obstructive sleep apnea) 09/17/2012   Osteopenia    Plantar fasciitis 09/09/2019   Past Surgical History:  Procedure Laterality Date   CESAREAN SECTION     COLONOSCOPY  10/29/2013   Colonic polyp status post polypectomy. Mild sigmoid diverticulosis. Small internal hemorrhoids   FOOT NEUROMA SURGERY     s/p surgery in both feet   FOOT SURGERY Bilateral    Per patient   MENISCUS REPAIR Left 09/13/2016   miniscus repair right  Right 2019   SHOULDER SURGERY Left    arthroscopy.    TUBAL LIGATION      Family History  Problem Relation Age of Onset   Hyperlipidemia Mother    Heart disease Mother    Hypertension Mother    Atrial fibrillation Mother    CAD Mother    Congestive Heart Failure Mother    CVA Father    Hypertension Brother    Hypertension Son    Colon cancer Neg Hx    Esophageal cancer Neg Hx    Social History   Socioeconomic History   Marital status: Married    Spouse name: Engineer, production   Number of children: 2   Years of education: Not on file   Highest education level: Not on file  Occupational History   Not on file  Tobacco Use   Smoking status: Never   Smokeless tobacco: Never  Vaping Use   Vaping status: Never Used  Substance and Sexual Activity   Alcohol use: No   Drug use: No   Sexual activity: Not Currently  Other Topics Concern   Not on file  Social History Narrative   Lives with  husband   Right handed   Caffeine: 1 cup of tea and 1 pepsi in the AM   Social Determinants of Health   Financial Resource Strain: Low Risk  (01/23/2022)   Overall Financial Resource Strain (CARDIA)    Difficulty of Paying Living Expenses: Not hard at all  Food Insecurity: No Food Insecurity (01/23/2022)   Hunger  Vital Sign    Worried About Programme researcher, broadcasting/film/video in the Last Year: Never true    Ran Out of Food in the Last Year: Never true  Transportation Needs: No Transportation Needs (01/23/2022)   PRAPARE - Administrator, Civil Service (Medical): No    Lack of Transportation (Non-Medical): No  Physical Activity: Sufficiently Active (08/25/2022)   Exercise Vital Sign    Days of Exercise per Week: 4 days    Minutes of Exercise per Session: 60 min  Stress: No Stress Concern Present (01/23/2022)   [employer] of Occupational Health - Occupational Stress Questionnaire    Feeling of Stress : Not at all  Social Connections: Moderately Integrated (01/23/2022)   Social Connection and Isolation Panel [NHANES]    Frequency of Communication with Friends and Family: More than three times a week    Frequency of Social Gatherings with Friends and Family: Once a week    Attends Religious Services: 1 to 4 times per year    Active Member of [employer] or Organizations: No    Attends Engineer, structural: Never    Marital Status: Married    Objective:  BP 118/78   Pulse 71   Temp (!) 97.3 F (36.3 C)   Ht 5\' 2"  (1.575 m)   Wt 190 lb (86.2 kg)   SpO2 97%   BMI 34.75 kg/m      08/25/2022    8:38 AM 05/05/2022    9:32 AM 04/27/2022   10:20 AM  BP/Weight  Systolic BP 118 130   Diastolic BP 78 70   Wt. (Lbs) 190 193 195  BMI 34.75 kg/m2 35.3 kg/m2 35.67 kg/m2    Physical Exam Vitals reviewed.  Constitutional:      Appearance: Normal appearance. She is normal weight.  Neck:     Vascular: No carotid bruit.  Cardiovascular:     Rate and Rhythm: Normal rate and regular  rhythm.     Heart sounds: Normal heart sounds.  Pulmonary:     Effort: Pulmonary effort is normal. No respiratory distress.     Breath sounds: Normal breath sounds.  Abdominal:     General: Abdomen is flat. Bowel sounds are normal.     Palpations: Abdomen is soft.     Tenderness: There is no abdominal tenderness.  Neurological:     Mental Status: She is alert and oriented to person, place, and time.  Psychiatric:        Mood and Affect: Mood normal.        Behavior: Behavior normal.     Diabetic Foot Exam - Simple   Simple Foot Form Diabetic Foot exam was performed with the following findings: Yes 08/25/2022  9:07 AM  Visual Inspection No deformities, no ulcerations, no other skin breakdown bilaterally: Yes Sensation Testing Intact to touch and monofilament testing bilaterally: Yes Pulse Check Posterior Tibialis and Dorsalis pulse intact bilaterally: Yes Comments      Lab Results  Component Value Date   WBC 6.5 05/05/2022   HGB 12.8 05/05/2022   HCT 38.4 05/05/2022   PLT 262 05/05/2022   GLUCOSE 104 (H) 05/05/2022   CHOL 154 05/05/2022   TRIG 196 (H) 05/05/2022   HDL 47 05/05/2022   LDLCALC 74 05/05/2022   ALT 16 05/05/2022   AST 24 05/05/2022   NA 138 05/05/2022   K 4.1 05/05/2022   CL 98 05/05/2022   CREATININE 1.12 (H) 05/05/2022   BUN 17 05/05/2022  CO2 24 05/05/2022   TSH 1.810 02/09/2021   INR 1.1 11/09/2021   HGBA1C 6.3 (H) 05/05/2022      Assessment & Plan:    Hypertensive heart disease with chronic diastolic congestive heart failure (HCC) Assessment & Plan: Well controlled.  No changes to medicines. Olmesartan 20 mg daily, Lasix 20 mg daily.  Continue to work on eating a healthy diet and exercise.  Labs drawn today.    Orders: -     CBC with Differential/Platelet -     Comprehensive metabolic panel  Hypertension associated with diabetes [facility]) Assessment & Plan: Well controlled.  No medication changes recommended. Continue healthy diet  and exercise.   Labs ordered   Gastroesophageal reflux disease without esophagitis Assessment & Plan: Continue omeprazole.   Diabetes mellitus type 2 with complications [facility]) Assessment & Plan: Control: Well controlled Recommend check sugars fasting daily. Recommend check feet daily. Recommend annual eye exams. Medicines: None Continue to work on eating a healthy diet and exercise.  Labs drawn today.     Orders: -     Hemoglobin A1c  Mixed hyperlipidemia Assessment & Plan: Well controlled.  No changes to medicines. Rosuvastatin 40 mg daily.  Continue to work on eating a healthy diet and exercise.  Labs drawn today.    Orders: -     Lipid panel  Other orders -     Zolpidem Tartrate; Take 1 tablet (5 mg total) by mouth at bedtime as needed for sleep.  Dispense: 30 tablet; Refill: 1     Meds ordered this encounter  Medications   zolpidem (AMBIEN) 5 MG tablet    Sig: Take 1 tablet (5 mg total) by mouth at bedtime as needed for sleep.    Dispense:  30 tablet    Refill:  1    Orders Placed This Encounter  Procedures   CBC with Differential/Platelet   Comprehensive metabolic panel   Hemoglobin A1c   Lipid panel     Follow-up: Return in about 3 months (around 11/25/2022) for chronic.   I,Marla I Leal-Borjas,acting as a scribe for Blane Ohara, MD.,have documented all relevant documentation on the behalf of Blane Ohara, MD,as directed by  Blane Ohara, MD while in the presence of Blane Ohara, MD.   An After Visit Summary was printed and given to the patient.  Blane Ohara, MD  Family Practice [phone]

## 2022-08-24 NOTE — Assessment & Plan Note (Signed)
Well controlled.  No changes to medicines. Rosuvastatin 40 mg daily. Continue to work on eating a healthy diet and exercise.  Labs drawn today.   

## 2022-08-24 NOTE — Assessment & Plan Note (Addendum)
Continue omeprazole 

## 2022-08-25 ENCOUNTER — Encounter: Payer: Self-pay | Admitting: Family Medicine

## 2022-08-25 ENCOUNTER — Ambulatory Visit (INDEPENDENT_AMBULATORY_CARE_PROVIDER_SITE_OTHER): Payer: Medicare Other | Admitting: Family Medicine

## 2022-08-25 VITALS — BP 118/78 | HR 71 | Temp 97.3°F | Ht 62.0 in | Wt 190.0 lb

## 2022-08-25 DIAGNOSIS — I110 Hypertensive heart disease with heart failure: Secondary | ICD-10-CM

## 2022-08-25 DIAGNOSIS — G4709 Other insomnia: Secondary | ICD-10-CM

## 2022-08-25 DIAGNOSIS — E1159 Type 2 diabetes mellitus with other circulatory complications: Secondary | ICD-10-CM

## 2022-08-25 DIAGNOSIS — E118 Type 2 diabetes mellitus with unspecified complications: Secondary | ICD-10-CM

## 2022-08-25 DIAGNOSIS — G8929 Other chronic pain: Secondary | ICD-10-CM

## 2022-08-25 DIAGNOSIS — E782 Mixed hyperlipidemia: Secondary | ICD-10-CM

## 2022-08-25 DIAGNOSIS — E6609 Other obesity due to excess calories: Secondary | ICD-10-CM

## 2022-08-25 DIAGNOSIS — M25511 Pain in right shoulder: Secondary | ICD-10-CM | POA: Diagnosis not present

## 2022-08-25 DIAGNOSIS — G4733 Obstructive sleep apnea (adult) (pediatric): Secondary | ICD-10-CM | POA: Diagnosis not present

## 2022-08-25 DIAGNOSIS — K219 Gastro-esophageal reflux disease without esophagitis: Secondary | ICD-10-CM | POA: Diagnosis not present

## 2022-08-25 DIAGNOSIS — I152 Hypertension secondary to endocrine disorders: Secondary | ICD-10-CM | POA: Diagnosis not present

## 2022-08-25 DIAGNOSIS — I5032 Chronic diastolic (congestive) heart failure: Secondary | ICD-10-CM

## 2022-08-25 DIAGNOSIS — Z6834 Body mass index (BMI) 34.0-34.9, adult: Secondary | ICD-10-CM

## 2022-08-25 MED ORDER — ZOLPIDEM TARTRATE 5 MG PO TABS: 5.0000 mg | ORAL_TABLET | Freq: Every evening | ORAL | 1 refills | Status: DC | PRN

## 2022-08-25 NOTE — Assessment & Plan Note (Signed)
Apply voltaren gel 4 times a day to right shoulder.

## 2022-08-25 NOTE — Patient Instructions (Signed)
Apply voltaren gel 4 times a day to right shoulder. If no improvement call Orthopedic doctor to schedule appt.

## 2022-08-27 DIAGNOSIS — E66811 Obesity, class 1: Secondary | ICD-10-CM | POA: Insufficient documentation

## 2022-08-27 DIAGNOSIS — E6609 Other obesity due to excess calories: Secondary | ICD-10-CM | POA: Insufficient documentation

## 2022-08-27 NOTE — Assessment & Plan Note (Signed)
Recommend continue to work on eating healthy diet and exercise.  

## 2022-08-27 NOTE — Assessment & Plan Note (Signed)
Benefiting and compliant with cpap.

## 2022-09-04 DIAGNOSIS — H10413 Chronic giant papillary conjunctivitis, bilateral: Secondary | ICD-10-CM | POA: Diagnosis not present

## 2022-09-08 ENCOUNTER — Ambulatory Visit (INDEPENDENT_AMBULATORY_CARE_PROVIDER_SITE_OTHER): Payer: Medicare Other | Admitting: Family Medicine

## 2022-09-08 ENCOUNTER — Encounter: Payer: Self-pay | Admitting: Family Medicine

## 2022-09-08 VITALS — BP 122/72 | HR 70 | Temp 97.3°F | Ht 62.0 in | Wt 190.0 lb

## 2022-09-08 DIAGNOSIS — R051 Acute cough: Secondary | ICD-10-CM | POA: Insufficient documentation

## 2022-09-08 DIAGNOSIS — S0240CA Maxillary fracture, right side, initial encounter for closed fracture: Secondary | ICD-10-CM | POA: Insufficient documentation

## 2022-09-08 LAB — POC COVID19 BINAXNOW: SARS Coronavirus 2 Ag: NEGATIVE

## 2022-09-08 LAB — POCT INFLUENZA A/B
Influenza A, POC: NEGATIVE
Influenza B, POC: NEGATIVE

## 2022-09-08 NOTE — Assessment & Plan Note (Addendum)
Flu - Negative Covid - Negative  Start taking Albuterol as needed for wheezing, patient states she has some at home Start taking Mucinex for congestion, patient says she has some at home  Increase fluid intake   Do not go in the pool for a week to see if symptoms improve as well

## 2022-09-08 NOTE — Progress Notes (Addendum)
Acute Office Visit  Subjective:    Patient ID: Diana Hendrix, female    DOB: 1945/01/28, 77 y.o.   MRN: [id number]  Chief Complaint  Patient presents with   URI    HPI: Acute cough: Patient is in today for cough, runny nose and wheezing x 2 days. Hx of asthma. Does not use inhalers or nebulizer. Denies fever/chills/HEADACHE/Body aches/SHob. She has been taking Claritin for a week, Flonase for 2 weeks and Singulair as well. She stated that she has also taken tessalon pearls for her cough. She was having itchy watery eyes and she went to her eye dr and was Rx allergy eye drops which has helped. Patient concerned that she might be allergic to the chlorine in the pool where she exercises.    Past Medical History:  Diagnosis Date   Cancer (HCC)    Diabetes mellitus without complication (HCC)    Diastolic heart failure (HCC)    Echo EF 60/65% Heart monitor was normal   GERD (gastroesophageal reflux disease)    History of colon polyps    Hyperlipidemia    Hypertension    Macular edema, cystoid    OSA (obstructive sleep apnea) 09/17/2012   Osteopenia    Plantar fasciitis 09/09/2019    Past Surgical History:  Procedure Laterality Date   CESAREAN SECTION     COLONOSCOPY  10/29/2013   Colonic polyp status post polypectomy. Mild sigmoid diverticulosis. Small internal hemorrhoids   FOOT NEUROMA SURGERY     s/p surgery in both feet   FOOT SURGERY Bilateral    Per patient   MENISCUS REPAIR Left 09/13/2016   miniscus repair right  Right 2019   SHOULDER SURGERY Left    arthroscopy.    TUBAL LIGATION      Family History  Problem Relation Age of Onset   Hyperlipidemia Mother    Heart disease Mother    Hypertension Mother    Atrial fibrillation Mother    CAD Mother    Congestive Heart Failure Mother    CVA Father    Hypertension Brother    Hypertension Son    Colon cancer Neg Hx    Esophageal cancer Neg Hx     Social History   Socioeconomic History   Marital status:  Married    Spouse name: Engineer, production   Number of children: 2   Years of education: Not on file   Highest education level: Not on file  Occupational History   Not on file  Tobacco Use   Smoking status: Never   Smokeless tobacco: Never  Vaping Use   Vaping status: Never Used  Substance and Sexual Activity   Alcohol use: No   Drug use: No   Sexual activity: Not Currently  Other Topics Concern   Not on file  Social History Narrative   Lives with husband   Right handed   Caffeine: 1 cup of tea and 1 pepsi in the AM   Social Determinants of Health   Financial Resource Strain: Low Risk  (01/23/2022)   Overall Financial Resource Strain (CARDIA)    Difficulty of Paying Living Expenses: Not hard at all  Food Insecurity: No Food Insecurity (01/23/2022)   Hunger Vital Sign    Worried About Running Out of Food in the Last Year: Never true    Ran Out of Food in the Last Year: Never true  Transportation Needs: No Transportation Needs (01/23/2022)   PRAPARE - Administrator, Civil Service (Medical):  No    Lack of Transportation (Non-Medical): No  Physical Activity: Sufficiently Active (08/25/2022)   Exercise Vital Sign    Days of Exercise per Week: 4 days    Minutes of Exercise per Session: 60 min  Stress: No Stress Concern Present (01/23/2022)   [employer] of Occupational Health - Occupational Stress Questionnaire    Feeling of Stress : Not at all  Social Connections: Moderately Integrated (01/23/2022)   Social Connection and Isolation Panel [NHANES]    Frequency of Communication with Friends and Family: More than three times a week    Frequency of Social Gatherings with Friends and Family: Once a week    Attends Religious Services: 1 to 4 times per year    Active Member of [employer] or Organizations: No    Attends Banker Meetings: Never    Marital Status: Married  Catering manager Violence: Not At Risk (11/10/2021)   Humiliation, Afraid, Rape, and Kick  questionnaire    Fear of Current or Ex-Partner: No    Emotionally Abused: No    Physically Abused: No    Sexually Abused: No    Outpatient Medications Prior to Visit  Medication Sig Dispense Refill   Cholecalciferol (VITAMIN D3) 50 MCG (2000 UT) TABS Take 1 tablet by mouth daily.     citalopram (CELEXA) 10 MG tablet Take 1 tablet (10 mg total) by mouth daily. 90 tablet 3   furosemide (LASIX) 20 MG tablet Take 1 tablet (20 mg total) by mouth daily. 90 tablet 3   metoprolol succinate (TOPROL-XL) 50 MG 24 hr tablet TAKE 1 AND 1/2 TABLETS(75 MG) BY MOUTH DAILY WITH OR IMMEDIATELY FOLLOWING A MEAL (Patient taking differently: 25 mg.) 90 tablet 0   montelukast (SINGULAIR) 10 MG tablet TAKE 1 TABLET(10 MG) BY MOUTH AT BEDTIME 90 tablet 1   NON FORMULARY CPAP at bedtime     olmesartan (BENICAR) 20 MG tablet Take 1 tablet (20 mg total) by mouth daily. 90 tablet 3   omega-3 acid ethyl esters (LOVAZA) 1 g capsule Take 2 capsules (2 g total) by mouth 2 (two) times daily. 360 capsule 2   omeprazole (PRILOSEC) 40 MG capsule Take 40 mg by mouth daily.     Polyethyl Glycol-Propyl Glycol (SYSTANE OP) Place 1 drop into both eyes 4 (four) times daily.     polyethylene glycol (MIRALAX / GLYCOLAX) 17 g packet Take 17 g by mouth daily.     rosuvastatin (CRESTOR) 40 MG tablet Take 1 tablet (40 mg total) by mouth daily. 90 tablet 1   zolpidem (AMBIEN) 5 MG tablet Take 1 tablet (5 mg total) by mouth at bedtime as needed for sleep. 30 tablet 1   No facility-administered medications prior to visit.    Allergies  Allergen Reactions   Cortisone Other (See Comments)    seizure  seizure , seizure   Glucosamine Other (See Comments)   Zyrtec [Cetirizine]     Weird dreams.    Ibuprofen Other (See Comments)    Pt states Dr doesn't wont her to take it; not allergic    Review of Systems  Constitutional:  Negative for chills, fatigue and fever.  HENT:  Positive for congestion and rhinorrhea. Negative for ear  pain, postnasal drip, sinus pressure, sinus pain and sore throat.   Respiratory:  Positive for cough. Negative for shortness of breath.   Cardiovascular:  Negative for chest pain.  Gastrointestinal:  Negative for diarrhea and nausea.  Neurological:  Negative for dizziness and headaches.  Objective:        09/08/2022   11:12 AM 08/25/2022    8:38 AM 05/05/2022    9:32 AM  Vitals with BMI  Height 5\' 2"  5\' 2"  5\' 2"   Weight 190 lbs 190 lbs 193 lbs  BMI 34.74 34.74 35.29  Systolic 122 118 161  Diastolic 72 78 70  Pulse 70 71 72    No data found.   Physical Exam Constitutional:      General: She is not in acute distress.    Appearance: Normal appearance.  HENT:     Head: Normocephalic.     Right Ear: Tympanic membrane normal.     Left Ear: Tympanic membrane normal.     Nose: Congestion and rhinorrhea present.     Mouth/Throat:     Mouth: Mucous membranes are moist.     Pharynx: No posterior oropharyngeal erythema.  Eyes:     Conjunctiva/sclera: Conjunctivae normal.  Neck:     Vascular: No carotid bruit.  Cardiovascular:     Rate and Rhythm: Normal rate and regular rhythm.     Heart sounds: Normal heart sounds.  Pulmonary:     Effort: Pulmonary effort is normal.     Breath sounds: Wheezing (expiratory) and rhonchi (cleared with cough) present.  Skin:    General: Skin is warm.  Neurological:     Mental Status: She is alert and oriented to person, place, and time.  Psychiatric:        Mood and Affect: Mood normal.        Behavior: Behavior normal.     Health Maintenance Due  Topic Date Due   COVID-19 Vaccine (7 - 2023-24 season) 03/11/2022   INFLUENZA VACCINE  08/17/2022    There are no preventive care reminders to display for this patient.   Lab Results  Component Value Date   TSH 1.810 02/09/2021   Lab Results  Component Value Date   WBC 5.2 08/25/2022   HGB 13.8 08/25/2022   HCT 42.3 08/25/2022   MCV 90 08/25/2022   PLT 252 08/25/2022   Lab  Results  Component Value Date   NA 139 08/25/2022   K 4.8 08/25/2022   CO2 23 08/25/2022   GLUCOSE 103 (H) 08/25/2022   BUN 15 08/25/2022   CREATININE 1.10 (H) 08/25/2022   BILITOT 0.4 08/25/2022   ALKPHOS 101 08/25/2022   AST 25 08/25/2022   ALT 22 08/25/2022   PROT 7.2 08/25/2022   ALBUMIN 4.7 08/25/2022   CALCIUM 10.2 08/25/2022   ANIONGAP 8 11/13/2021   EGFR 52 (L) 08/25/2022   GFR 55.96 (L) 11/25/2019   Lab Results  Component Value Date   CHOL 156 08/25/2022   Lab Results  Component Value Date   HDL 53 08/25/2022   Lab Results  Component Value Date   LDLCALC 80 08/25/2022   Lab Results  Component Value Date   TRIG 134 08/25/2022   Lab Results  Component Value Date   CHOLHDL 2.9 08/25/2022   Lab Results  Component Value Date   HGBA1C 6.6 (H) 08/25/2022       Assessment & Plan:    Acute cough     Assessment & Plan:  Flu - Negative Covid - Negative   Start taking Albuterol as needed for wheezing, patient states she has some at home Start taking Mucinex for congestion, patient says she has some at home   Increase fluid intake    Do not go in the pool for a  week to see if symptoms improve as well  No orders of the defined types were placed in this encounter.   Orders Placed This Encounter  Procedures   POCT Influenza A/B   POC COVID-19 BinaxNow     Follow-up: Return if symptoms worsen or fail to improve.  Total time spent on today's visit was greater than 20 minutes, including both face-to-face time and nonface-to-face time personally spent on review of chart (labs and imaging), discussing labs and goals, discussing further work-up, treatment options, referrals to specialist if needed, reviewing outside records of pertinent, answering patient's questions, and coordinating care.   An After Visit Summary was printed and given to the patient.  Renne Crigler, FNP [facility] [phone]

## 2022-09-08 NOTE — Patient Instructions (Addendum)
Start taking Albuterol as needed for wheezing, call if medication is expired and you need a refill Start taking Mucinex for congestion Increase fluid intake  Do not go in the pool for a week to see if symptoms improve as well

## 2022-09-11 ENCOUNTER — Other Ambulatory Visit: Payer: Self-pay | Admitting: Family Medicine

## 2022-09-11 ENCOUNTER — Encounter: Payer: Self-pay | Admitting: Family Medicine

## 2022-09-11 IMAGING — MR MR FOREARM*R* W/O CM
13 series · 39 of 40 positions shown · non-contrast
Comparison: Right wrist x-ray 11/25/2019

CLINICAL DATA: Proximal right forearm pain for 4 months, weakness

EXAM:
MRI OF THE RIGHT FOREARM WITHOUT CONTRAST
TECHNIQUE: Multiplanar, multisequence MR imaging of the right forearm was
performed. No intravenous contrast was administered.

[Series 5: T1 · axial · 6.0mm · 0.23mm/px · z∈[-106,+144]mm · 5 of 36 slices shown (1 of 6)]
[im 1/36]
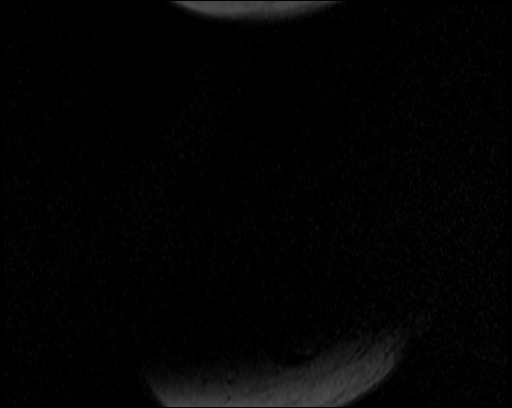
[im 9/36]
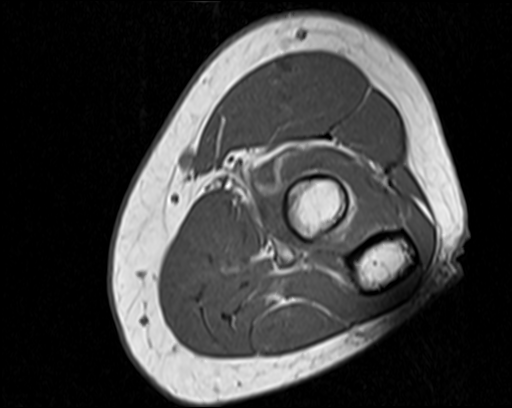
[im 18/36]
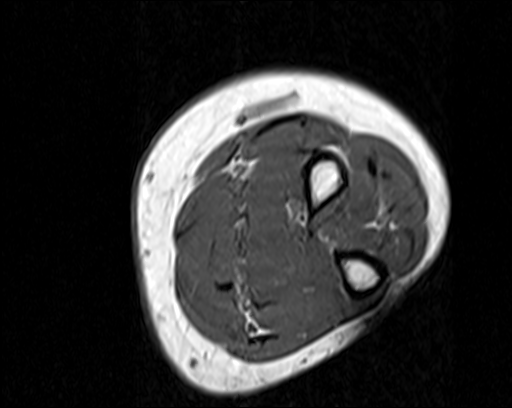
[im 27/36]
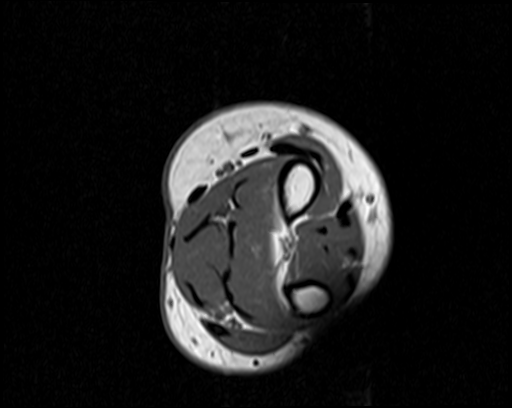
[im 36/36]
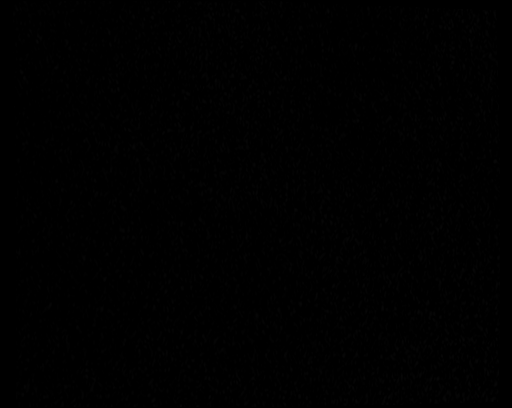

[Series 7: T1 · oblique · 4.0mm · 0.65mm/px · 4 of 23 slices shown (2 of 6)]
[im 1/23]
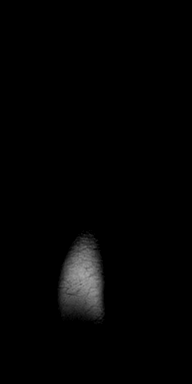
[im 8/23]
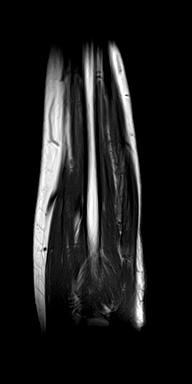
[im 15/23]
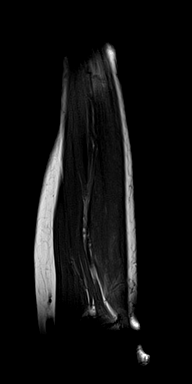
[im 23/23]
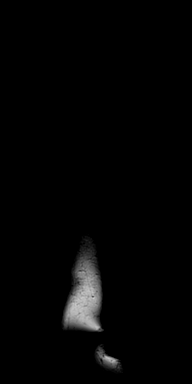

[Series 9: T2 fat-sat · oblique · 4.0mm · 0.39mm/px · 3 of 23 slices shown (1 of 5)]
[im 1/23]
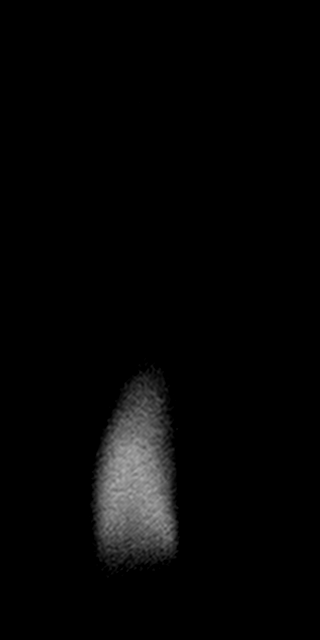
[im 12/23]
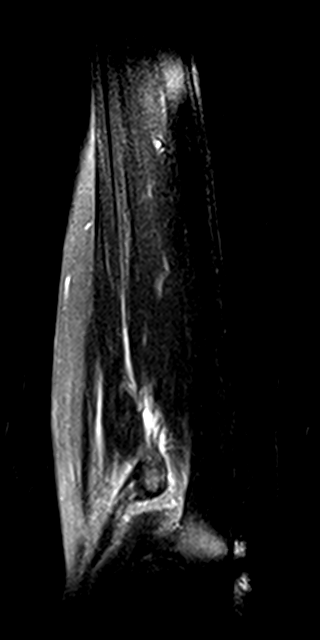
[im 23/23]
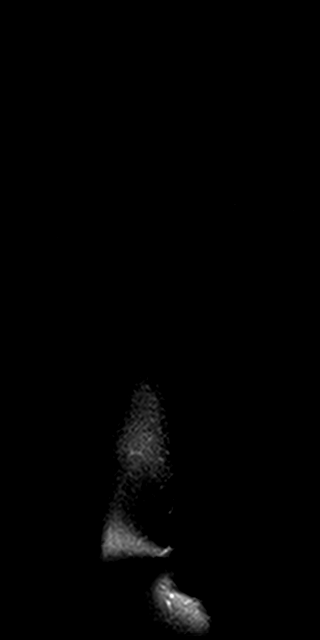

[Series 10: T2 fat-sat · axial · right · 5.0mm · 0.41mm/px · z∈[-105,+129]mm · 5 of 43 slices shown (2 of 5)]
[im 1/43]
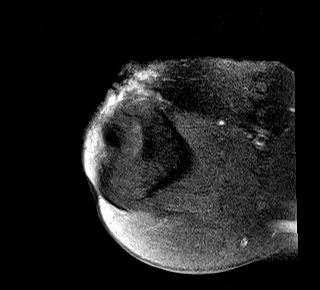
[im 11/43]
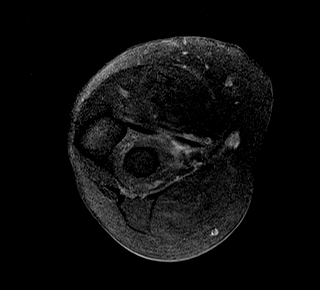
[im 22/43]
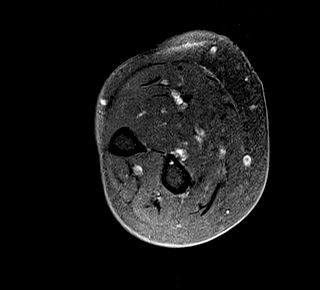
[im 32/43]
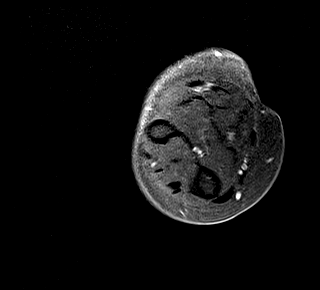
[im 43/43]
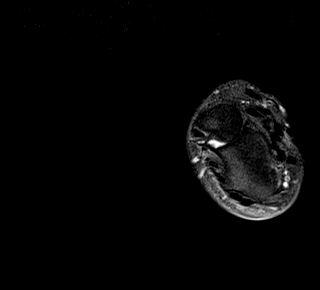

[Series 10: T1 · oblique · 4.0mm · 0.33mm/px · 2 of 15 slices shown (3 of 6)]
[im 1/15]
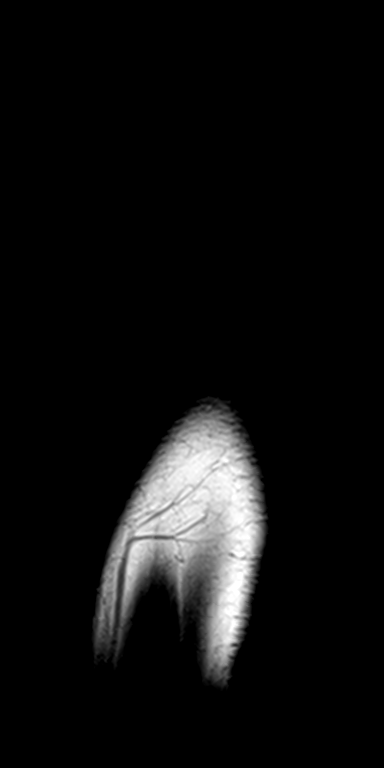
[im 15/15]
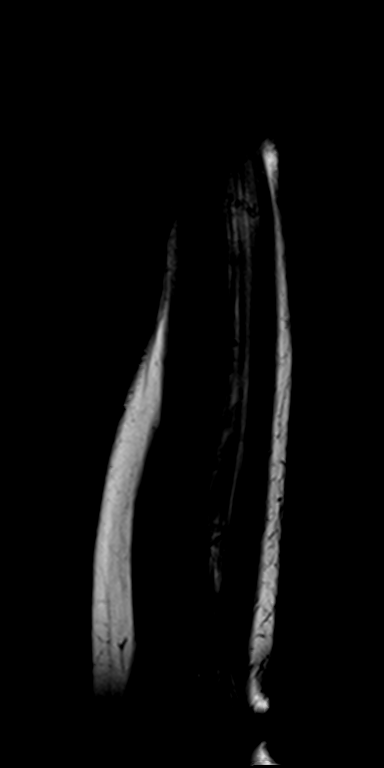

[Series 11: T1 · oblique · right · 4.0mm · 0.83mm/px · 2 of 21 slices shown (4 of 6)]
[im 1/21]
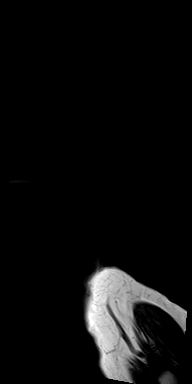
[im 21/21]
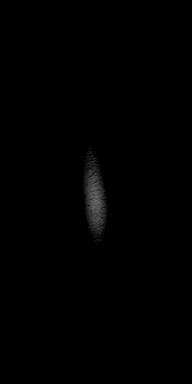

[Series 11: T2 fat-sat · oblique · 4.0mm · 0.40mm/px · 2 of 21 slices shown (3 of 5)]
[im 1/21]
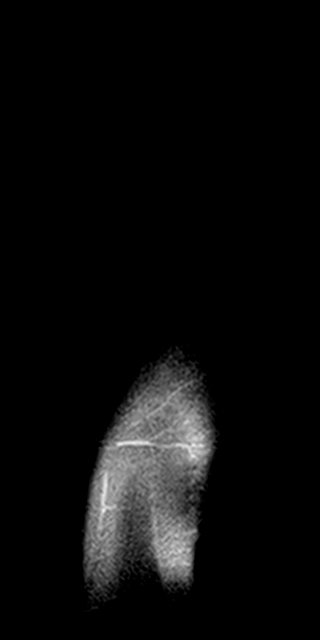
[im 21/21]
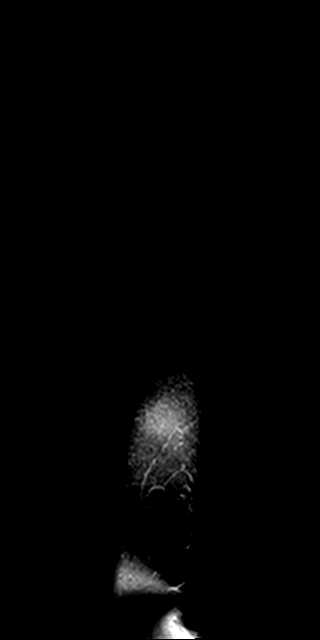

[Series 12: T1 · axial · right · 5.0mm · 0.41mm/px · z∈[-105,+129]mm · 5 of 43 slices shown (5 of 6)]
[im 1/43]
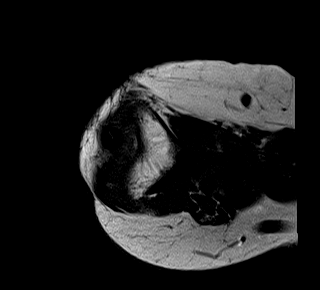
[im 11/43]
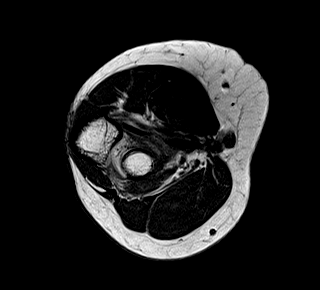
[im 22/43]
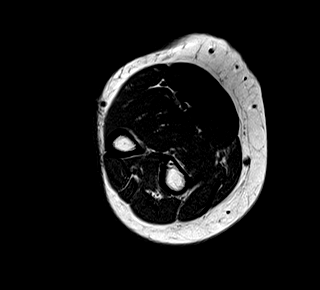
[im 32/43]
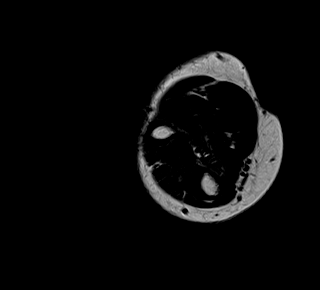
[im 43/43]
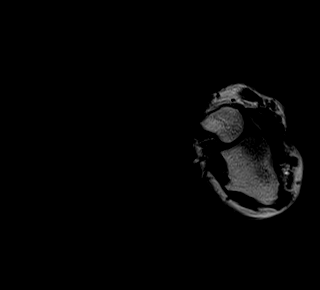

[Series 13: T2 fat-sat · axial · 6.0mm · 0.62mm/px · z∈[-103,+147]mm · 4 of 36 slices shown (4 of 5)]
[im 1/36]
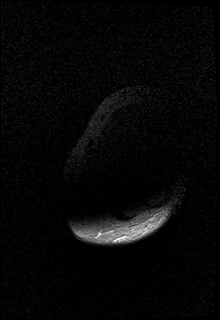
[im 12/36]
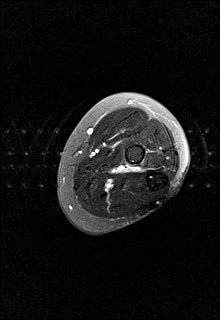
[im 24/36]
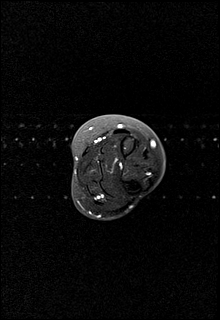
[im 36/36]
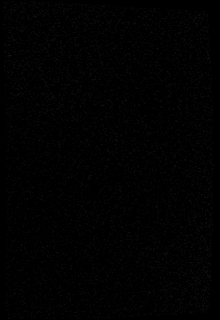

[Series 13: STIR · oblique · right · 4.0mm · 1.00mm/px · 2 of 21 slices shown (1 of 2)]
[im 1/21]
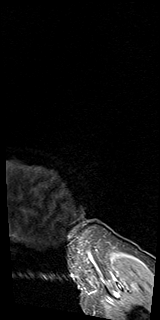
[im 21/21]
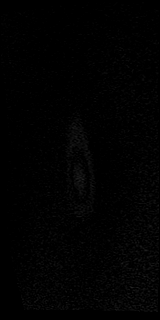

[Series 14: STIR · oblique · 4.0mm · 0.78mm/px · 1 of 21 slices shown (2 of 2)]
[im 1/21]
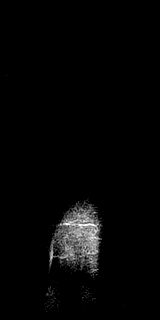

[Series 15: T2 fat-sat · oblique · right · 4.0mm · 0.83mm/px · 2 of 21 slices shown (5 of 5)]
[im 1/21]
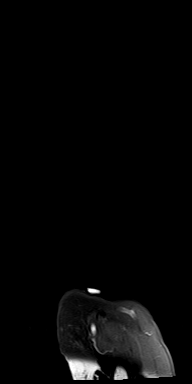
[im 21/21]
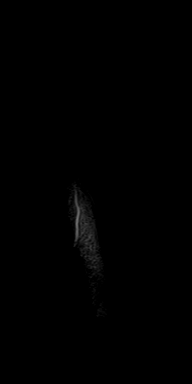

[Series 16: T1 · oblique · right · 4.0mm · 0.83mm/px · 2 of 21 slices shown (6 of 6)]
[im 1/21]
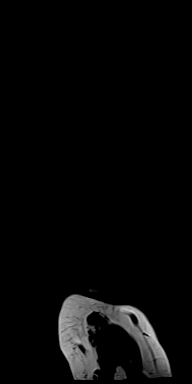
[im 21/21]
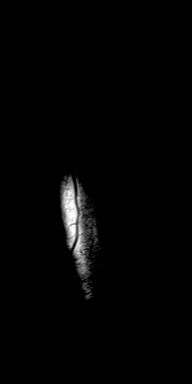

[39 of 40 positions shown; findings below may reference images not displayed]

FINDINGS: Bones/Joint/Cartilage

No acute fracture. No dislocation. There is mild bone marrow edema
within the radial tuberosity. Osseous structures appear otherwise
within normal limits. No wrist or elbow joint effusion. No
suspicious marrow replacing bone lesion.

Ligaments

Visualized ligamentous structures appear intact.

Muscles and Tendons

Marked tendinosis with low-grade partial-thickness tearing of the
distal biceps tendon just proximal to its insertion on the radial
tuberosity (series 10, images 31-33). There is associated
peritendinous edema and a trace amount of fluid within the
bicipitoradialis bursa. Mild tendinosis with low-grade
partial-thickness tear involving the common extensor tendon origin
at the lateral humeral epicondyle (series 10, images 37-38).
Remaining visualized tendinous structures appear within normal
limits. There is intramuscular edema within the deep supinator
muscle, likely reactive secondary to adjacent distal biceps tendon
pathology. Remaining visualized musculature is otherwise normal in
bulk and signal intensity.

Soft tissues

No soft tissue fluid collection or hematoma.
IMPRESSION: 1. Marked tendinosis with partial-thickness tearing of the distal
biceps tendon. Associated peritendinous edema and a trace amount of
fluid within the bicipitoradialis bursa, likely reactive. Mild
reactive bone marrow edema within the radial tuberosity.
2. Mild tendinosis with low-grade partial-thickness tear involving
the common extensor tendon origin at the lateral humeral epicondyle.
3. No acute osseous abnormality of the right forearm.

## 2022-09-11 MED ORDER — AZITHROMYCIN 250 MG PO TABS: ORAL_TABLET | ORAL | 0 refills | Status: DC

## 2022-09-20 ENCOUNTER — Ambulatory Visit (INDEPENDENT_AMBULATORY_CARE_PROVIDER_SITE_OTHER): Payer: Medicare Other | Admitting: Physician Assistant

## 2022-09-20 ENCOUNTER — Encounter: Payer: Self-pay | Admitting: Physician Assistant

## 2022-09-20 VITALS — BP 112/68 | HR 75 | Temp 97.3°F | Ht 62.0 in | Wt 191.0 lb

## 2022-09-20 DIAGNOSIS — J3489 Other specified disorders of nose and nasal sinuses: Secondary | ICD-10-CM

## 2022-09-20 DIAGNOSIS — R0981 Nasal congestion: Secondary | ICD-10-CM | POA: Insufficient documentation

## 2022-09-20 DIAGNOSIS — J208 Acute bronchitis due to other specified organisms: Secondary | ICD-10-CM

## 2022-09-20 MED ORDER — AMOXICILLIN-POT CLAVULANATE 875-125 MG PO TABS: 1.0000 | ORAL_TABLET | Freq: Two times a day (BID) | ORAL | 0 refills | Status: DC

## 2022-09-20 MED ORDER — PREDNISONE 20 MG PO TABS
ORAL_TABLET | ORAL | 0 refills | Status: AC
Start: 2022-09-20 — End: 2022-09-28

## 2022-09-20 NOTE — Progress Notes (Signed)
Acute Office Visit  Subjective:    Patient ID: Diana Hendrix, female    DOB: [birth year], 77 y.o.   MRN: [id number]  Chief Complaint  Patient presents with   Gum Pain    HPI: Patient is in today for referral to ENT. States she was seen a few weeks ago and tx with a nausea ax bx- zithromax Was having gum pain and seen her Periodontist (she has periodontal disease) states they did not find anything wrong with her gums or teeth however her sinuses appeared cloudy in their xrays and recommended she see's ENT. Denies any excessive pressure in her sinuses but states that last year she fell on her face and damaged her back and her nose. She saw someone who told her they would not fix the issue as long as she can breath and smell it shouldn't be an issue. Since then she has had chronic sinus drainage that is clear. She has since seen an allergist who had her try multiple different allergy medicines including flonase for her sinus drainage which she states has not helped.  Patient admits to a recent deep cough that was treated with azithromycin and she states it helped, but she still has a little bit of tightness in her chest, along with a mild cough and having to feel like she needs to clear her throat. Denies any chronic chest pain, excessive sob, dizziness, numbness, tingling, GERD.   Past Medical History:  Diagnosis Date   Cancer (HCC)    Diabetes mellitus without complication (HCC)    Diastolic heart failure (HCC)    Echo EF 60/65% Heart monitor was normal   GERD (gastroesophageal reflux disease)    History of colon polyps    Hyperlipidemia    Hypertension    Macular edema, cystoid    OSA (obstructive sleep apnea) 09/17/2012   Osteopenia    Plantar fasciitis 09/09/2019    Past Surgical History:  Procedure Laterality Date   CESAREAN SECTION     COLONOSCOPY  10/29/2013   Colonic polyp status post polypectomy. Mild sigmoid diverticulosis. Small internal hemorrhoids   FOOT NEUROMA  SURGERY     s/p surgery in both feet   FOOT SURGERY Bilateral    Per patient   MENISCUS REPAIR Left 09/13/2016   miniscus repair right  Right 2019   SHOULDER SURGERY Left    arthroscopy.    TUBAL LIGATION      Family History  Problem Relation Age of Onset   Hyperlipidemia Mother    Heart disease Mother    Hypertension Mother    Atrial fibrillation Mother    CAD Mother    Congestive Heart Failure Mother    CVA Father    Hypertension Brother    Hypertension Son    Colon cancer Neg Hx    Esophageal cancer Neg Hx     Social History   Socioeconomic History   Marital status: Married    Spouse name: Engineer, production   Number of children: 2   Years of education: Not on file   Highest education level: Not on file  Occupational History   Not on file  Tobacco Use   Smoking status: Never   Smokeless tobacco: Never  Vaping Use   Vaping status: Never Used  Substance and Sexual Activity   Alcohol use: No   Drug use: No   Sexual activity: Not Currently  Other Topics Concern   Not on file  Social History Narrative   Lives with  husband   Right handed   Caffeine: 1 cup of tea and 1 pepsi in the AM   Social Determinants of Health   Financial Resource Strain: Low Risk  (01/23/2022)   Overall Financial Resource Strain (CARDIA)    Difficulty of Paying Living Expenses: Not hard at all  Food Insecurity: No Food Insecurity (01/23/2022)   Hunger Vital Sign    Worried About Running Out of Food in the Last Year: Never true    Ran Out of Food in the Last Year: Never true  Transportation Needs: No Transportation Needs (01/23/2022)   PRAPARE - Administrator, Civil Service (Medical): No    Lack of Transportation (Non-Medical): No  Physical Activity: Sufficiently Active (08/25/2022)   Exercise Vital Sign    Days of Exercise per Week: 4 days    Minutes of Exercise per Session: 60 min  Stress: No Stress Concern Present (01/23/2022)   [employer] of Occupational Health -  Occupational Stress Questionnaire    Feeling of Stress : Not at all  Social Connections: Moderately Integrated (01/23/2022)   Social Connection and Isolation Panel [NHANES]    Frequency of Communication with Friends and Family: More than three times a week    Frequency of Social Gatherings with Friends and Family: Once a week    Attends Religious Services: 1 to 4 times per year    Active Member of [employer] or Organizations: No    Attends Banker Meetings: Never    Marital Status: Married  Catering manager Violence: Not At Risk (11/10/2021)   Humiliation, Afraid, Rape, and Kick questionnaire    Fear of Current or Ex-Partner: No    Emotionally Abused: No    Physically Abused: No    Sexually Abused: No    Outpatient Medications Prior to Visit  Medication Sig Dispense Refill   loratadine (CLARITIN) 10 MG tablet Take 10 mg by mouth daily.     Cholecalciferol (VITAMIN D3) 50 MCG (2000 UT) TABS Take 1 tablet by mouth daily.     citalopram (CELEXA) 10 MG tablet Take 1 tablet (10 mg total) by mouth daily. 90 tablet 3   furosemide (LASIX) 20 MG tablet Take 1 tablet (20 mg total) by mouth daily. 90 tablet 3   metoprolol succinate (TOPROL-XL) 50 MG 24 hr tablet TAKE 1 AND 1/2 TABLETS(75 MG) BY MOUTH DAILY WITH OR IMMEDIATELY FOLLOWING A MEAL (Patient taking differently: 25 mg.) 90 tablet 0   montelukast (SINGULAIR) 10 MG tablet TAKE 1 TABLET(10 MG) BY MOUTH AT BEDTIME 90 tablet 1   NON FORMULARY CPAP at bedtime     olmesartan (BENICAR) 20 MG tablet Take 1 tablet (20 mg total) by mouth daily. 90 tablet 3   omega-3 acid ethyl esters (LOVAZA) 1 g capsule Take 2 capsules (2 g total) by mouth 2 (two) times daily. 360 capsule 2   omeprazole (PRILOSEC) 40 MG capsule Take 40 mg by mouth daily.     Polyethyl Glycol-Propyl Glycol (SYSTANE OP) Place 1 drop into both eyes 4 (four) times daily.     polyethylene glycol (MIRALAX / GLYCOLAX) 17 g packet Take 17 g by mouth daily.     rosuvastatin  (CRESTOR) 40 MG tablet Take 1 tablet (40 mg total) by mouth daily. 90 tablet 1   zolpidem (AMBIEN) 5 MG tablet Take 1 tablet (5 mg total) by mouth at bedtime as needed for sleep. 30 tablet 1   azithromycin (ZITHROMAX) 250 MG tablet 2 DAILY FOR FIRST DAY,  THEN DECREASE TO ONE DAILY FOR 4 MORE DAYS. 6 tablet 0   No facility-administered medications prior to visit.    Allergies  Allergen Reactions   Cortisone Other (See Comments)    seizure  seizure , seizure   Glucosamine Other (See Comments)   Zyrtec [Cetirizine]     Weird dreams.    Ibuprofen Other (See Comments)    Pt states Dr doesn't wont her to take it; not allergic    Review of Systems  Constitutional:  Negative for chills, fatigue and fever.  HENT:  Positive for congestion, postnasal drip and rhinorrhea. Negative for ear pain, sinus pressure, sinus pain and sore throat.   Respiratory:  Positive for cough and chest tightness. Negative for shortness of breath.   Cardiovascular:  Negative for chest pain.  Gastrointestinal:  Negative for abdominal pain, constipation, diarrhea, nausea and vomiting.  Genitourinary:  Negative for dysuria and urgency.  Musculoskeletal:  Negative for back pain and myalgias.  Neurological:  Negative for dizziness, weakness, light-headedness and headaches.  Psychiatric/Behavioral:  Negative for dysphoric mood. The patient is not nervous/anxious.        Objective:        09/20/2022    3:05 PM 09/08/2022   11:12 AM 08/25/2022    8:38 AM  Vitals with BMI  Height 5\' 2"  5\' 2"  5\' 2"   Weight 191 lbs 190 lbs 190 lbs  BMI 34.93 34.74 34.74  Systolic 112 122 409  Diastolic 68 72 78  Pulse 75 70 71    No data found.   Physical Exam Vitals reviewed.  Constitutional:      Appearance: Normal appearance.  HENT:     Head: Normocephalic.     Right Ear: Hearing, tympanic membrane and ear canal normal.     Left Ear: Hearing, tympanic membrane and ear canal normal.     Nose: Nasal deformity, septal  deviation, congestion and rhinorrhea present. Rhinorrhea is clear.     Right Nostril: No epistaxis.     Left Nostril: No epistaxis.     Right Turbinates: Enlarged.     Left Turbinates: Enlarged and swollen.     Right Sinus: No maxillary sinus tenderness or frontal sinus tenderness.     Left Sinus: No maxillary sinus tenderness or frontal sinus tenderness.     Mouth/Throat:     Mouth: Mucous membranes are moist.     Dentition: Dental tenderness present. No gum lesions.     Tongue: No lesions. Tongue does not deviate from midline.     Pharynx: Oropharynx is clear. Uvula midline. No pharyngeal swelling or posterior oropharyngeal erythema.     Tonsils: No tonsillar exudate or tonsillar abscesses.  Neck:     Vascular: No carotid bruit.  Cardiovascular:     Rate and Rhythm: Normal rate and regular rhythm.     Heart sounds: Normal heart sounds.  Pulmonary:     Effort: Pulmonary effort is normal.     Breath sounds: Examination of the right-middle field reveals rhonchi. Examination of the left-middle field reveals rhonchi. Examination of the left-lower field reveals rhonchi. Rhonchi present.  Chest:     Chest wall: Tenderness present.  Abdominal:     General: Bowel sounds are normal.     Palpations: Abdomen is soft.     Tenderness: There is no abdominal tenderness.  Neurological:     Mental Status: She is alert and oriented to person, place, and time.  Psychiatric:        Mood and  Affect: Mood normal.        Behavior: Behavior normal.     Health Maintenance Due  Topic Date Due   INFLUENZA VACCINE  08/17/2022   COVID-19 Vaccine (7 - 2023-24 season) 09/17/2022    There are no preventive care reminders to display for this patient.   Lab Results  Component Value Date   TSH 1.810 02/09/2021   Lab Results  Component Value Date   WBC 5.2 08/25/2022   HGB 13.8 08/25/2022   HCT 42.3 08/25/2022   MCV 90 08/25/2022   PLT 252 08/25/2022   Lab Results  Component Value Date   NA  139 08/25/2022   K 4.8 08/25/2022   CO2 23 08/25/2022   GLUCOSE 103 (H) 08/25/2022   BUN 15 08/25/2022   CREATININE 1.10 (H) 08/25/2022   BILITOT 0.4 08/25/2022   ALKPHOS 101 08/25/2022   AST 25 08/25/2022   ALT 22 08/25/2022   PROT 7.2 08/25/2022   ALBUMIN 4.7 08/25/2022   CALCIUM 10.2 08/25/2022   ANIONGAP 8 11/13/2021   EGFR 52 (L) 08/25/2022   GFR 55.96 (L) 11/25/2019   Lab Results  Component Value Date   CHOL 156 08/25/2022   Lab Results  Component Value Date   HDL 53 08/25/2022   Lab Results  Component Value Date   LDLCALC 80 08/25/2022   Lab Results  Component Value Date   TRIG 134 08/25/2022   Lab Results  Component Value Date   CHOLHDL 2.9 08/25/2022   Lab Results  Component Value Date   HGBA1C 6.6 (H) 08/25/2022       Assessment & Plan:  Acute bronchitis due to other specified organisms Assessment & Plan: Prescribed Augmentin 875mg , Prednisone  Will continue to monitor symptoms into next week if they don't get any better will send for x-ray to rule out pneumonia  Orders: -     Amoxicillin-Pot Clavulanate; Take 1 tablet by mouth 2 (two) times daily.  Dispense: 20 tablet; Refill: 0 -     predniSONE; Take 3 tablets (60 mg total) by mouth daily with breakfast for 3 days, THEN 2 tablets (40 mg total) daily with breakfast for 3 days, THEN 1 tablet (20 mg total) daily with breakfast for 3 days.  Dispense: 18 tablet; Refill: 0  Nasal congestion with rhinorrhea Assessment & Plan: Referral to ENT Prescribed prednisone to help with inflammation and sinus drainage Will follow up after ENT to see their recommendations.   Orders: -     Ambulatory referral to ENT     Meds ordered this encounter  Medications   amoxicillin-clavulanate (AUGMENTIN) 875-125 MG tablet    Sig: Take 1 tablet by mouth 2 (two) times daily.    Dispense:  20 tablet    Refill:  0   predniSONE (DELTASONE) 20 MG tablet    Sig: Take 3 tablets (60 mg total) by mouth daily with  breakfast for 3 days, THEN 2 tablets (40 mg total) daily with breakfast for 3 days, THEN 1 tablet (20 mg total) daily with breakfast for 3 days.    Dispense:  18 tablet    Refill:  0    Orders Placed This Encounter  Procedures   Ambulatory referral to ENT     Follow-up: No follow-ups on file.  An After Visit Summary was printed and given to the patient.  Langley Gauss, [facility] [phone]

## 2022-09-20 NOTE — Assessment & Plan Note (Signed)
Referral to ENT Prescribed prednisone to help with inflammation and sinus drainage Will follow up after ENT to see their recommendations.

## 2022-09-20 NOTE — Assessment & Plan Note (Signed)
Prescribed Augmentin 875mg , Prednisone  Will continue to monitor symptoms into next week if they don't get any better will send for x-ray to rule out pneumonia

## 2022-09-21 ENCOUNTER — Ambulatory Visit: Payer: Medicare Other | Admitting: Physician Assistant

## 2022-09-25 ENCOUNTER — Ambulatory Visit: Payer: Medicare Other

## 2022-09-26 ENCOUNTER — Other Ambulatory Visit: Payer: Self-pay | Admitting: Physician Assistant

## 2022-09-26 DIAGNOSIS — R053 Chronic cough: Secondary | ICD-10-CM | POA: Diagnosis not present

## 2022-09-26 DIAGNOSIS — R918 Other nonspecific abnormal finding of lung field: Secondary | ICD-10-CM | POA: Diagnosis not present

## 2022-09-27 ENCOUNTER — Encounter: Payer: Self-pay | Admitting: Physician Assistant

## 2022-09-28 ENCOUNTER — Encounter: Payer: Self-pay | Admitting: Physician Assistant

## 2022-09-28 ENCOUNTER — Ambulatory Visit: Payer: Medicare Other | Admitting: Physician Assistant

## 2022-09-28 VITALS — BP 152/60 | HR 68 | Temp 97.1°F | Resp 14 | Ht 62.0 in | Wt 193.0 lb

## 2022-09-28 DIAGNOSIS — J181 Lobar pneumonia, unspecified organism: Secondary | ICD-10-CM | POA: Diagnosis not present

## 2022-09-28 MED ORDER — CEFTRIAXONE SODIUM 1 G IJ SOLR
1.0000 g | Freq: Once | INTRAMUSCULAR | Status: AC
Start: 2022-09-28 — End: 2022-09-28
  Administered 2022-09-28: 1 g via INTRAMUSCULAR

## 2022-09-28 MED ORDER — LEVOFLOXACIN 750 MG PO TABS
750.0000 mg | ORAL_TABLET | Freq: Every day | ORAL | 0 refills | Status: DC
Start: 2022-09-28 — End: 2022-10-12

## 2022-09-28 NOTE — Progress Notes (Signed)
Acute Office Visit  Subjective:    Patient ID: Diana Hendrix, female    DOB: Mar 17, 1945, 77 y.o.   MRN: [id number]  Chief Complaint  Patient presents with   Cough    HPI: Patient is in today for chest tightness, cough, shortness of breath and chest pain with radiation to the back. She has tried many medicines OTC and it did not help her. States that she has only gotten worse while taking the Augmentin. She states the tightness is worse to where she feels like something is constricting her lungs. Able to take a deep breath, denies tachycardia, dizziness, syncope, SOB, chest pain. States that the prednisone helped to open up her sinuses, but clear out her sinus congestion.   Past Medical History:  Diagnosis Date   Cancer (HCC)    Diabetes mellitus without complication (HCC)    Diastolic heart failure (HCC)    Echo EF 60/65% Heart monitor was normal   GERD (gastroesophageal reflux disease)    History of colon polyps    Hyperlipidemia    Hypertension    Macular edema, cystoid    OSA (obstructive sleep apnea) 09/17/2012   Osteopenia    Plantar fasciitis 09/09/2019    Past Surgical History:  Procedure Laterality Date   CESAREAN SECTION     COLONOSCOPY  10/29/2013   Colonic polyp status post polypectomy. Mild sigmoid diverticulosis. Small internal hemorrhoids   FOOT NEUROMA SURGERY     s/p surgery in both feet   FOOT SURGERY Bilateral    Per patient   MENISCUS REPAIR Left 09/13/2016   miniscus repair right  Right 2019   SHOULDER SURGERY Left    arthroscopy.    TUBAL LIGATION      Family History  Problem Relation Age of Onset   Hyperlipidemia Mother    Heart disease Mother    Hypertension Mother    Atrial fibrillation Mother    CAD Mother    Congestive Heart Failure Mother    CVA Father    Hypertension Brother    Hypertension Son    Colon cancer Neg Hx    Esophageal cancer Neg Hx     Social History   Socioeconomic History   Marital status: Married     Spouse name: Engineer, production   Number of children: 2   Years of education: Not on file   Highest education level: Not on file  Occupational History   Not on file  Tobacco Use   Smoking status: Never   Smokeless tobacco: Never  Vaping Use   Vaping status: Never Used  Substance and Sexual Activity   Alcohol use: No   Drug use: No   Sexual activity: Not Currently  Other Topics Concern   Not on file  Social History Narrative   Lives with husband   Right handed   Caffeine: 1 cup of tea and 1 pepsi in the AM   Social Determinants of Health   Financial Resource Strain: Low Risk  (01/23/2022)   Overall Financial Resource Strain (CARDIA)    Difficulty of Paying Living Expenses: Not hard at all  Food Insecurity: No Food Insecurity (01/23/2022)   Hunger Vital Sign    Worried About Running Out of Food in the Last Year: Never true    Ran Out of Food in the Last Year: Never true  Transportation Needs: No Transportation Needs (01/23/2022)   PRAPARE - Administrator, Civil Service (Medical): No    Lack of Transportation (  Non-Medical): No  Physical Activity: Sufficiently Active (08/25/2022)   Exercise Vital Sign    Days of Exercise per Week: 4 days    Minutes of Exercise per Session: 60 min  Stress: No Stress Concern Present (01/23/2022)   [employer] of Occupational Health - Occupational Stress Questionnaire    Feeling of Stress : Not at all  Social Connections: Moderately Integrated (01/23/2022)   Social Connection and Isolation Panel [NHANES]    Frequency of Communication with Friends and Family: More than three times a week    Frequency of Social Gatherings with Friends and Family: Once a week    Attends Religious Services: 1 to 4 times per year    Active Member of [employer] or Organizations: No    Attends Banker Meetings: Never    Marital Status: Married  Catering manager Violence: Not At Risk (11/10/2021)   Humiliation, Afraid, Rape, and Kick questionnaire    Fear  of Current or Ex-Partner: No    Emotionally Abused: No    Physically Abused: No    Sexually Abused: No    Outpatient Medications Prior to Visit  Medication Sig Dispense Refill   Cholecalciferol (VITAMIN D3) 50 MCG (2000 UT) TABS Take 1 tablet by mouth daily.     citalopram (CELEXA) 10 MG tablet Take 1 tablet (10 mg total) by mouth daily. 90 tablet 3   furosemide (LASIX) 20 MG tablet Take 1 tablet (20 mg total) by mouth daily. 90 tablet 3   loratadine (CLARITIN) 10 MG tablet Take 10 mg by mouth daily.     metoprolol succinate (TOPROL-XL) 50 MG 24 hr tablet TAKE 1 AND 1/2 TABLETS(75 MG) BY MOUTH DAILY WITH OR IMMEDIATELY FOLLOWING A MEAL (Patient taking differently: 25 mg.) 90 tablet 0   montelukast (SINGULAIR) 10 MG tablet TAKE 1 TABLET(10 MG) BY MOUTH AT BEDTIME 90 tablet 1   NON FORMULARY CPAP at bedtime     olmesartan (BENICAR) 20 MG tablet Take 1 tablet (20 mg total) by mouth daily. 90 tablet 3   omega-3 acid ethyl esters (LOVAZA) 1 g capsule Take 2 capsules (2 g total) by mouth 2 (two) times daily. 360 capsule 2   omeprazole (PRILOSEC) 40 MG capsule Take 40 mg by mouth daily.     Polyethyl Glycol-Propyl Glycol (SYSTANE OP) Place 1 drop into both eyes 4 (four) times daily.     polyethylene glycol (MIRALAX / GLYCOLAX) 17 g packet Take 17 g by mouth daily.     predniSONE (DELTASONE) 20 MG tablet Take 3 tablets (60 mg total) by mouth daily with breakfast for 3 days, THEN 2 tablets (40 mg total) daily with breakfast for 3 days, THEN 1 tablet (20 mg total) daily with breakfast for 3 days. 18 tablet 0   rosuvastatin (CRESTOR) 40 MG tablet Take 1 tablet (40 mg total) by mouth daily. 90 tablet 1   zolpidem (AMBIEN) 5 MG tablet Take 1 tablet (5 mg total) by mouth at bedtime as needed for sleep. 30 tablet 1   amoxicillin-clavulanate (AUGMENTIN) 875-125 MG tablet Take 1 tablet by mouth 2 (two) times daily. 20 tablet 0   No facility-administered medications prior to visit.    Allergies  Allergen  Reactions   Cortisone Other (See Comments)    seizure  seizure , seizure   Glucosamine Other (See Comments)   Zyrtec [Cetirizine]     Weird dreams.    Ibuprofen Other (See Comments)    Pt states Dr doesn't wont her to take  it; not allergic    Review of Systems  Constitutional:  Negative for chills, fatigue and fever.  HENT:  Negative for congestion, ear pain and sore throat.   Respiratory:  Positive for cough, chest tightness and shortness of breath. Negative for stridor.   Cardiovascular:  Negative for chest pain and palpitations.  Gastrointestinal:  Negative for abdominal pain, constipation, diarrhea, nausea and vomiting.  Endocrine: Negative for polydipsia, polyphagia and polyuria.  Genitourinary:  Negative for difficulty urinating and dysuria.  Musculoskeletal:  Negative for arthralgias, back pain and myalgias.  Skin:  Negative for rash.  Neurological:  Negative for headaches.  Psychiatric/Behavioral:  Negative for dysphoric mood. The patient is not nervous/anxious.        Objective:        09/28/2022    3:12 PM 09/20/2022    3:05 PM 09/08/2022   11:12 AM  Vitals with BMI  Height 5\' 2"  5\' 2"  5\' 2"   Weight 193 lbs 191 lbs 190 lbs  BMI 35.29 34.93 34.74  Systolic 152 112 161  Diastolic 60 68 72  Pulse 68 75 70    No data found.   Physical Exam Vitals reviewed.  Constitutional:      Appearance: Normal appearance.  Cardiovascular:     Rate and Rhythm: Normal rate and regular rhythm.     Heart sounds: Normal heart sounds.  Pulmonary:     Effort: Pulmonary effort is normal.     Breath sounds: No stridor. Examination of the left-lower field reveals rhonchi. Rhonchi present. No wheezing.  Abdominal:     General: Bowel sounds are normal.     Palpations: Abdomen is soft.     Tenderness: There is no abdominal tenderness.  Neurological:     Mental Status: She is alert and oriented to person, place, and time.  Psychiatric:        Mood and Affect: Mood normal.         Behavior: Behavior normal.     Health Maintenance Due  Topic Date Due   INFLUENZA VACCINE  08/17/2022   COVID-19 Vaccine (7 - 2023-24 season) 09/17/2022    There are no preventive care reminders to display for this patient.   Lab Results  Component Value Date   TSH 1.810 02/09/2021   Lab Results  Component Value Date   WBC 5.2 08/25/2022   HGB 13.8 08/25/2022   HCT 42.3 08/25/2022   MCV 90 08/25/2022   PLT 252 08/25/2022   Lab Results  Component Value Date   NA 139 08/25/2022   K 4.8 08/25/2022   CO2 23 08/25/2022   GLUCOSE 103 (H) 08/25/2022   BUN 15 08/25/2022   CREATININE 1.10 (H) 08/25/2022   BILITOT 0.4 08/25/2022   ALKPHOS 101 08/25/2022   AST 25 08/25/2022   ALT 22 08/25/2022   PROT 7.2 08/25/2022   ALBUMIN 4.7 08/25/2022   CALCIUM 10.2 08/25/2022   ANIONGAP 8 11/13/2021   EGFR 52 (L) 08/25/2022   GFR 55.96 (L) 11/25/2019   Lab Results  Component Value Date   CHOL 156 08/25/2022   Lab Results  Component Value Date   HDL 53 08/25/2022   Lab Results  Component Value Date   LDLCALC 80 08/25/2022   Lab Results  Component Value Date   TRIG 134 08/25/2022   Lab Results  Component Value Date   CHOLHDL 2.9 08/25/2022   Lab Results  Component Value Date   HGBA1C 6.6 (H) 08/25/2022  Assessment & Plan:  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  Dispense: 5 tablet; Refill: 0     Meds ordered this encounter  Medications   cefTRIAXone (ROCEPHIN) injection 1 g   levofloxacin (LEVAQUIN) 750 MG tablet    Sig: Take 1 tablet (750 mg total) by mouth daily.    Dispense:  5 tablet     Refill:  0    No orders of the defined types were placed in this encounter.    Follow-up: No follow-ups on file.  An After Visit Summary was printed and given to the patient.  Langley Gauss, [facility] [phone]

## 2022-09-28 NOTE — Assessment & Plan Note (Signed)
Rocephin 1g IM administered in clinic Prescribed levothyroxin 750mg   Patient will continue to monitor symptoms Discussed warning signs with her about admission criteria to the ER If she does not improve she will go to the hospital

## 2022-10-02 ENCOUNTER — Encounter: Payer: Self-pay | Admitting: Pulmonary Disease

## 2022-10-02 ENCOUNTER — Ambulatory Visit (INDEPENDENT_AMBULATORY_CARE_PROVIDER_SITE_OTHER): Payer: Medicare Other | Admitting: Pulmonary Disease

## 2022-10-02 ENCOUNTER — Telehealth: Payer: Self-pay | Admitting: Pulmonary Disease

## 2022-10-02 ENCOUNTER — Ambulatory Visit: Payer: Medicare Other

## 2022-10-02 VITALS — BP 110/70 | HR 71 | Ht 62.0 in | Wt 195.4 lb

## 2022-10-02 DIAGNOSIS — G4733 Obstructive sleep apnea (adult) (pediatric): Secondary | ICD-10-CM

## 2022-10-02 DIAGNOSIS — E669 Obesity, unspecified: Secondary | ICD-10-CM | POA: Diagnosis not present

## 2022-10-02 DIAGNOSIS — G4730 Sleep apnea, unspecified: Secondary | ICD-10-CM | POA: Diagnosis not present

## 2022-10-02 MED ORDER — PREDNISONE 20 MG PO TABS: 20.0000 mg | ORAL_TABLET | Freq: Every day | ORAL | 0 refills | Status: DC

## 2022-10-02 MED ORDER — TRAMADOL HCL 50 MG PO TABS: 50.0000 mg | ORAL_TABLET | Freq: Four times a day (QID) | ORAL | 0 refills | Status: AC | PRN

## 2022-10-02 NOTE — Telephone Encounter (Signed)
Patient needs refill on albuterol inhaler

## 2022-10-02 NOTE — Progress Notes (Signed)
Diana Hendrix    [id number]    Feb 23, 1945  Primary Care Physician:[facility], Fritzi Mandes, MD  Referring Physician: Blane Ohara, MD [address] [place],  [place] [address]  Chief complaint:   Patient known to Dr. Craige Cotta who I am seeing for follow-up History of asthma, obstructive sleep apnea completing course of antibiotics for pneumonia   HPI:   Patient is currently completing course of antibiotics for pneumonia Was treated with a Z-Pak, followed by amoxicillin and now finishing up a course of Levaquin  Having some chest discomfort for which she is taking pain medications  She feels her breathing feels a little bit better but concerned about her back pain Has been using some tramadol that she had leftover from previous  She is on CPAP, pressure of 6  She does use inhalers as needed  Has not been wheezing, not feeling congested  she continues to try to stay active  Not having significant problems with using CPAP  Outpatient Encounter Medications as of 10/02/2022  Medication Sig   Cholecalciferol (VITAMIN D3) 50 MCG (2000 UT) TABS Take 1 tablet by mouth daily.   citalopram (CELEXA) 10 MG tablet Take 1 tablet (10 mg total) by mouth daily.   furosemide (LASIX) 20 MG tablet Take 1 tablet (20 mg total) by mouth daily.   levofloxacin (LEVAQUIN) 750 MG tablet Take 1 tablet (750 mg total) by mouth daily.   loratadine (CLARITIN) 10 MG tablet Take 10 mg by mouth daily.   metoprolol succinate (TOPROL-XL) 50 MG 24 hr tablet TAKE 1 AND 1/2 TABLETS(75 MG) BY MOUTH DAILY WITH OR IMMEDIATELY FOLLOWING A MEAL (Patient taking differently: 25 mg.)   montelukast (SINGULAIR) 10 MG tablet TAKE 1 TABLET(10 MG) BY MOUTH AT BEDTIME   NON FORMULARY CPAP at bedtime   olmesartan (BENICAR) 20 MG tablet Take 1 tablet (20 mg total) by mouth daily.   omega-3 acid ethyl esters (LOVAZA) 1 g capsule Take 2 capsules (2 g total) by mouth 2 (two) times daily.   omeprazole (PRILOSEC) 40 MG  capsule Take 40 mg by mouth daily.   Polyethyl Glycol-Propyl Glycol (SYSTANE OP) Place 1 drop into both eyes 4 (four) times daily.   polyethylene glycol (MIRALAX / GLYCOLAX) 17 g packet Take 17 g by mouth daily.   rosuvastatin (CRESTOR) 40 MG tablet Take 1 tablet (40 mg total) by mouth daily.   zolpidem (AMBIEN) 5 MG tablet Take 1 tablet (5 mg total) by mouth at bedtime as needed for sleep.   No facility-administered encounter medications on file as of 10/02/2022.    Allergies as of 10/02/2022 - Review Complete 10/02/2022  Allergen Reaction Noted   Cortisone Other (See Comments) 04/26/2017   Glucosamine Other (See Comments) 04/16/2017   Zyrtec [cetirizine]  01/04/2021   Ibuprofen Other (See Comments) 08/14/2019    Past Medical History:  Diagnosis Date   Cancer (HCC)    Diabetes mellitus without complication (HCC)    Diastolic heart failure (HCC)    Echo EF 60/65% Heart monitor was normal   GERD (gastroesophageal reflux disease)    History of colon polyps    Hyperlipidemia    Hypertension    Macular edema, cystoid    OSA (obstructive sleep apnea) 09/17/2012   Osteopenia    Plantar fasciitis 09/09/2019    Past Surgical History:  Procedure Laterality Date   CESAREAN SECTION     COLONOSCOPY  10/29/2013   Colonic polyp status post polypectomy.  Mild sigmoid diverticulosis. Small internal hemorrhoids   FOOT NEUROMA SURGERY     s/p surgery in both feet   FOOT SURGERY Bilateral    Per patient   MENISCUS REPAIR Left 09/13/2016   miniscus repair right  Right 2019   SHOULDER SURGERY Left    arthroscopy.    TUBAL LIGATION      Family History  Problem Relation Age of Onset   Hyperlipidemia Mother    Heart disease Mother    Hypertension Mother    Atrial fibrillation Mother    CAD Mother    Congestive Heart Failure Mother    CVA Father    Hypertension Brother    Hypertension Son    Colon cancer Neg Hx    Esophageal cancer Neg Hx     Social History   Socioeconomic  History   Marital status: Married    Spouse name: Engineer, production   Number of children: 2   Years of education: Not on file   Highest education level: Not on file  Occupational History   Not on file  Tobacco Use   Smoking status: Never   Smokeless tobacco: Never  Vaping Use   Vaping status: Never Used  Substance and Sexual Activity   Alcohol use: No   Drug use: No   Sexual activity: Not Currently  Other Topics Concern   Not on file  Social History Narrative   Lives with husband   Right handed   Caffeine: 1 cup of tea and 1 pepsi in the AM   Social Determinants of Health   Financial Resource Strain: Low Risk  (01/23/2022)   Overall Financial Resource Strain (CARDIA)    Difficulty of Paying Living Expenses: Not hard at all  Food Insecurity: No Food Insecurity (01/23/2022)   Hunger Vital Sign    Worried About Running Out of Food in the Last Year: Never true    Ran Out of Food in the Last Year: Never true  Transportation Needs: No Transportation Needs (01/23/2022)   PRAPARE - Administrator, Civil Service (Medical): No    Lack of Transportation (Non-Medical): No  Physical Activity: Sufficiently Active (08/25/2022)   Exercise Vital Sign    Days of Exercise per Week: 4 days    Minutes of Exercise per Session: 60 min  Stress: No Stress Concern Present (01/23/2022)   [employer] of Occupational Health - Occupational Stress Questionnaire    Feeling of Stress : Not at all  Social Connections: Moderately Integrated (01/23/2022)   Social Connection and Isolation Panel [NHANES]    Frequency of Communication with Friends and Family: More than three times a week    Frequency of Social Gatherings with Friends and Family: Once a week    Attends Religious Services: 1 to 4 times per year    Active Member of [employer] or Organizations: No    Attends Banker Meetings: Never    Marital Status: Married  Catering manager Violence: Not At Risk (11/10/2021)   Humiliation, Afraid,  Rape, and Kick questionnaire    Fear of Current or Ex-Partner: No    Emotionally Abused: No    Physically Abused: No    Sexually Abused: No    Review of Systems  Respiratory:  Positive for apnea and cough. Negative for shortness of breath.   Psychiatric/Behavioral:  Positive for sleep disturbance.     Vitals:   10/02/22 1516  BP: 110/70  Pulse: 71  SpO2: 97%     Physical  Exam Constitutional:      Appearance: She is obese.  HENT:     Head: Normocephalic.     Mouth/Throat:     Mouth: Mucous membranes are moist.  Eyes:     General: No scleral icterus.    Pupils: Pupils are equal, round, and reactive to light.  Cardiovascular:     Rate and Rhythm: Normal rate and regular rhythm.     Heart sounds: No murmur heard.    No friction rub.  Pulmonary:     Effort: No respiratory distress.     Breath sounds: No stridor. Rales present. No wheezing or rhonchi.  Musculoskeletal:     Cervical back: No rigidity or tenderness.  Neurological:     Mental Status: She is alert.  Psychiatric:        Mood and Affect: Mood normal.    Data Reviewed: Most recent PFT 08/07/2019 shows normal pulmonary function test -Repeat PFT 04/25/2022-within normal limits with no obstruction, no restriction, normal diffusing capacity  Most recent FeNo 09/06/2021 shows a FeNo level of 32  Polysomnogram from 2014 reveals mild obstructive sleep apnea, was on auto CPAP Over time CPAP has been adjusted and currently just on a CPAP of 6  CPAP compliance is not available today  PFT was fully reviewed with the patient during the visit, compliance data was reviewed with the patient  Assessment:  Obstructive sleep apnea -Well treated with CPAP therapy -Symptoms controlled with CPAP therapy  Chronic cough -Symptoms continue to improve  Recent pneumonia for which she is completing the course of Levaquin Having some chest discomfort -Will call in a prescription for prednisone -She requested a short course of  tramadol which I did send into pharmacy  Asthma with previously uncontrolled symptoms -She appears to be doing very well without any inhalers at present  Plan/Recommendations:  Continue CPAP use  Complete course of Levaquin  Tramadol sent to pharmacy  Prescription for prednisone 20 mg daily for 7 days sent to pharmacy  Encouraged to call us back in a few days if not feeling better  I will see her back in about 6 months  Encouraged to call with significant concerns    Virl Diamond MD [facility] 10/02/2022, 3:19 PM  CC: Blane Ohara, MD

## 2022-10-02 NOTE — Patient Instructions (Addendum)
Prescription for prednisone called into pharmacy  Prescription for tramadol for 3 days called to pharmacy  Call us if you are not feeling any better  The prednisone should help swelling and irritation  Since you have used 3 courses of antibiotics, I think it is best to give it some time  We can consider repeating a chest x-ray if you are not feeling any better  Tentative follow-up in 3 months

## 2022-10-05 ENCOUNTER — Encounter: Payer: Self-pay | Admitting: Physician Assistant

## 2022-10-06 MED ORDER — ALBUTEROL SULFATE HFA 108 (90 BASE) MCG/ACT IN AERS: 2.0000 | INHALATION_SPRAY | Freq: Four times a day (QID) | RESPIRATORY_TRACT | 1 refills | Status: DC | PRN

## 2022-10-06 NOTE — Telephone Encounter (Signed)
Rx for pt's albuterol inhaler has been sent to the pharmacy for pt.

## 2022-10-10 ENCOUNTER — Encounter: Payer: Self-pay | Admitting: Pulmonary Disease

## 2022-10-11 NOTE — Telephone Encounter (Signed)
Patient was last seen on 9/16.

## 2022-10-12 ENCOUNTER — Other Ambulatory Visit: Payer: Self-pay | Admitting: Pulmonary Disease

## 2022-10-12 ENCOUNTER — Encounter: Payer: Self-pay | Admitting: Physician Assistant

## 2022-10-12 ENCOUNTER — Ambulatory Visit: Payer: Medicare Other | Admitting: Physician Assistant

## 2022-10-12 ENCOUNTER — Other Ambulatory Visit: Payer: Self-pay | Admitting: Physician Assistant

## 2022-10-12 ENCOUNTER — Telehealth: Payer: Self-pay

## 2022-10-12 ENCOUNTER — Telehealth: Payer: Self-pay | Admitting: Physician Assistant

## 2022-10-12 VITALS — BP 120/70 | HR 67 | Temp 97.4°F | Resp 14 | Ht 62.0 in | Wt 195.0 lb

## 2022-10-12 DIAGNOSIS — M4854XA Collapsed vertebra, not elsewhere classified, thoracic region, initial encounter for fracture: Secondary | ICD-10-CM | POA: Diagnosis not present

## 2022-10-12 DIAGNOSIS — I700 Atherosclerosis of aorta: Secondary | ICD-10-CM | POA: Diagnosis not present

## 2022-10-12 DIAGNOSIS — R7989 Other specified abnormal findings of blood chemistry: Secondary | ICD-10-CM

## 2022-10-12 DIAGNOSIS — R918 Other nonspecific abnormal finding of lung field: Secondary | ICD-10-CM | POA: Diagnosis not present

## 2022-10-12 DIAGNOSIS — R071 Chest pain on breathing: Secondary | ICD-10-CM

## 2022-10-12 DIAGNOSIS — I517 Cardiomegaly: Secondary | ICD-10-CM | POA: Diagnosis not present

## 2022-10-12 MED ORDER — TRAMADOL HCL 50 MG PO TABS
50.0000 mg | ORAL_TABLET | Freq: Three times a day (TID) | ORAL | 0 refills | Status: AC | PRN
Start: 2022-10-12 — End: 2022-10-19

## 2022-10-12 NOTE — Progress Notes (Signed)
Acute Office Visit  Subjective:    Patient ID: Diana Hendrix, female    DOB: Apr 14, 1945, 77 y.o.   MRN: [id number]  Chief Complaint  Patient presents with   Chest Pain   Back Pain    HPI: Patient is in today for chest pain, back pain since one month ago. She saw pulmonologist and he gave her prednisone. She finished it. She is concerned about is something else is necessary to be check. Patient states that she is still having the chest pain that is deep and goes through to her back. She states the only thing that has helped her is the Tramadol.  States that the only time she has gotten some relief is when she slouches and does not lean back. Went and saw her pulmonologist who prescribed her albuterol and Prednisone. Neither one has helped her with the pain.   Past Medical History:  Diagnosis Date   Cancer (HCC)    Diabetes mellitus without complication (HCC)    Diastolic heart failure (HCC)    Echo EF 60/65% Heart monitor was normal   GERD (gastroesophageal reflux disease)    History of colon polyps    Hyperlipidemia    Hypertension    Macular edema, cystoid    OSA (obstructive sleep apnea) 09/17/2012   Osteopenia    Plantar fasciitis 09/09/2019    Past Surgical History:  Procedure Laterality Date   CESAREAN SECTION     COLONOSCOPY  10/29/2013   Colonic polyp status post polypectomy. Mild sigmoid diverticulosis. Small internal hemorrhoids   FOOT NEUROMA SURGERY     s/p surgery in both feet   FOOT SURGERY Bilateral    Per patient   MENISCUS REPAIR Left 09/13/2016   miniscus repair right  Right 2019   SHOULDER SURGERY Left    arthroscopy.    TUBAL LIGATION      Family History  Problem Relation Age of Onset   Hyperlipidemia Mother    Heart disease Mother    Hypertension Mother    Atrial fibrillation Mother    CAD Mother    Congestive Heart Failure Mother    CVA Father    Hypertension Brother    Hypertension Son    Colon cancer Neg Hx    Esophageal cancer  Neg Hx     Social History   Socioeconomic History   Marital status: Married    Spouse name: Engineer, production   Number of children: 2   Years of education: Not on file   Highest education level: Not on file  Occupational History   Not on file  Tobacco Use   Smoking status: Never   Smokeless tobacco: Never  Vaping Use   Vaping status: Never Used  Substance and Sexual Activity   Alcohol use: No   Drug use: No   Sexual activity: Not Currently  Other Topics Concern   Not on file  Social History Narrative   Lives with husband   Right handed   Caffeine: 1 cup of tea and 1 pepsi in the AM   Social Determinants of Health   Financial Resource Strain: Low Risk  (01/23/2022)   Overall Financial Resource Strain (CARDIA)    Difficulty of Paying Living Expenses: Not hard at all  Food Insecurity: No Food Insecurity (01/23/2022)   Hunger Vital Sign    Worried About Running Out of Food in the Last Year: Never true    Ran Out of Food in the Last Year: Never true  Transportation  Needs: No Transportation Needs (01/23/2022)   PRAPARE - Administrator, Civil Service (Medical): No    Lack of Transportation (Non-Medical): No  Physical Activity: Sufficiently Active (08/25/2022)   Exercise Vital Sign    Days of Exercise per Week: 4 days    Minutes of Exercise per Session: 60 min  Stress: No Stress Concern Present (01/23/2022)   [employer] of Occupational Health - Occupational Stress Questionnaire    Feeling of Stress : Not at all  Social Connections: Moderately Integrated (01/23/2022)   Social Connection and Isolation Panel [NHANES]    Frequency of Communication with Friends and Family: More than three times a week    Frequency of Social Gatherings with Friends and Family: Once a week    Attends Religious Services: 1 to 4 times per year    Active Member of [employer] or Organizations: No    Attends Banker Meetings: Never    Marital Status: Married  Catering manager Violence:  Not At Risk (11/10/2021)   Humiliation, Afraid, Rape, and Kick questionnaire    Fear of Current or Ex-Partner: No    Emotionally Abused: No    Physically Abused: No    Sexually Abused: No    Outpatient Medications Prior to Visit  Medication Sig Dispense Refill   albuterol (VENTOLIN HFA) 108 (90 Base) MCG/ACT inhaler Inhale 2 puffs into the lungs every 6 (six) hours as needed for wheezing or shortness of breath. 8 g 1   Cholecalciferol (VITAMIN D3) 50 MCG (2000 UT) TABS Take 1 tablet by mouth daily.     citalopram (CELEXA) 10 MG tablet Take 1 tablet (10 mg total) by mouth daily. 90 tablet 3   furosemide (LASIX) 20 MG tablet Take 1 tablet (20 mg total) by mouth daily. 90 tablet 3   loratadine (CLARITIN) 10 MG tablet Take 10 mg by mouth daily.     metoprolol succinate (TOPROL-XL) 50 MG 24 hr tablet TAKE 1 AND 1/2 TABLETS(75 MG) BY MOUTH DAILY WITH OR IMMEDIATELY FOLLOWING A MEAL (Patient taking differently: 25 mg.) 90 tablet 0   montelukast (SINGULAIR) 10 MG tablet TAKE 1 TABLET(10 MG) BY MOUTH AT BEDTIME 90 tablet 1   NON FORMULARY CPAP at bedtime     olmesartan (BENICAR) 20 MG tablet Take 1 tablet (20 mg total) by mouth daily. 90 tablet 3   omega-3 acid ethyl esters (LOVAZA) 1 g capsule Take 2 capsules (2 g total) by mouth 2 (two) times daily. 360 capsule 2   omeprazole (PRILOSEC) 40 MG capsule Take 40 mg by mouth daily.     Polyethyl Glycol-Propyl Glycol (SYSTANE OP) Place 1 drop into both eyes 4 (four) times daily.     polyethylene glycol (MIRALAX / GLYCOLAX) 17 g packet Take 17 g by mouth daily.     rosuvastatin (CRESTOR) 40 MG tablet Take 1 tablet (40 mg total) by mouth daily. 90 tablet 1   zolpidem (AMBIEN) 5 MG tablet Take 1 tablet (5 mg total) by mouth at bedtime as needed for sleep. 30 tablet 1   levofloxacin (LEVAQUIN) 750 MG tablet Take 1 tablet (750 mg total) by mouth daily. 5 tablet 0   predniSONE (DELTASONE) 20 MG tablet Take 1 tablet (20 mg total) by mouth daily with breakfast.  7 tablet 0   No facility-administered medications prior to visit.    Allergies  Allergen Reactions   Cortisone Other (See Comments)    seizure  seizure , seizure   Glucosamine Other (See Comments)  Zyrtec [Cetirizine]     Weird dreams.    Ibuprofen Other (See Comments)    Pt states Dr doesn't wont her to take it; not allergic    Review of Systems  Constitutional:  Negative for chills, fatigue and fever.  HENT:  Negative for congestion, ear pain and sore throat.   Respiratory:  Negative for cough and shortness of breath.   Cardiovascular:  Positive for chest pain. Negative for palpitations.  Gastrointestinal:  Negative for abdominal pain, constipation, diarrhea, nausea and vomiting.  Endocrine: Negative for polydipsia, polyphagia and polyuria.  Genitourinary:  Negative for difficulty urinating and dysuria.  Musculoskeletal:  Positive for back pain. Negative for arthralgias and myalgias.  Skin:  Negative for rash.  Neurological:  Negative for headaches.  Psychiatric/Behavioral:  Negative for dysphoric mood. The patient is not nervous/anxious.        Objective:        10/12/2022   10:39 AM 10/02/2022    3:16 PM 09/28/2022    3:12 PM  Vitals with BMI  Height 5\' 2"  5\' 2"  5\' 2"   Weight 195 lbs 195 lbs 6 oz 193 lbs  BMI 35.66 35.73 35.29  Systolic 120 110 119  Diastolic 70 70 60  Pulse 67 71 68    No data found.   Physical Exam Vitals reviewed.  Constitutional:      Appearance: Normal appearance.  Neck:     Vascular: No carotid bruit.  Cardiovascular:     Rate and Rhythm: Normal rate and regular rhythm.     Heart sounds: Normal heart sounds.  Pulmonary:     Effort: Pulmonary effort is normal.     Breath sounds: Normal breath sounds. No decreased breath sounds, wheezing or rhonchi.  Abdominal:     General: Bowel sounds are normal.     Palpations: Abdomen is soft.     Tenderness: There is no abdominal tenderness.  Neurological:     Mental Status: She is alert  and oriented to person, place, and time.  Psychiatric:        Mood and Affect: Mood normal.        Behavior: Behavior normal.     Health Maintenance Due  Topic Date Due   INFLUENZA VACCINE  08/17/2022   COVID-19 Vaccine (7 - 2023-24 season) 09/17/2022    There are no preventive care reminders to display for this patient.   Lab Results  Component Value Date   TSH 1.810 02/09/2021   Lab Results  Component Value Date   WBC 5.2 08/25/2022   HGB 13.8 08/25/2022   HCT 42.3 08/25/2022   MCV 90 08/25/2022   PLT 252 08/25/2022   Lab Results  Component Value Date   NA 139 08/25/2022   K 4.8 08/25/2022   CO2 23 08/25/2022   GLUCOSE 103 (H) 08/25/2022   BUN 15 08/25/2022   CREATININE 1.10 (H) 08/25/2022   BILITOT 0.4 08/25/2022   ALKPHOS 101 08/25/2022   AST 25 08/25/2022   ALT 22 08/25/2022   PROT 7.2 08/25/2022   ALBUMIN 4.7 08/25/2022   CALCIUM 10.2 08/25/2022   ANIONGAP 8 11/13/2021   EGFR 52 (L) 08/25/2022   GFR 55.96 (L) 11/25/2019   Lab Results  Component Value Date   CHOL 156 08/25/2022   Lab Results  Component Value Date   HDL 53 08/25/2022   Lab Results  Component Value Date   LDLCALC 80 08/25/2022   Lab Results  Component Value Date   TRIG 134 08/25/2022  Lab Results  Component Value Date   CHOLHDL 2.9 08/25/2022   Lab Results  Component Value Date   HGBA1C 6.6 (H) 08/25/2022       Assessment & Plan:  Chest pain on breathing Assessment & Plan: Patient has completed all antibiotics Sent for STAT labs at [facility]  Will perform CT chest if all negative.  Orders: -     EKG 12-Lead -     traMADol HCl; Take 1 tablet (50 mg total) by mouth every 8 (eight) hours as needed for up to 7 days.  Dispense: 15 tablet; Refill: 0     Meds ordered this encounter  Medications   traMADol (ULTRAM) 50 MG tablet    Sig: Take 1 tablet (50 mg total) by mouth every 8 (eight) hours as needed for up to 7 days.    Dispense:  15 tablet    Refill:   0    Orders Placed This Encounter  Procedures   EKG 12-Lead     Follow-up: No follow-ups on file.  An After Visit Summary was printed and given to the patient.  Langley Gauss, [place] [facility] [phone]

## 2022-10-12 NOTE — Telephone Encounter (Signed)
Schedule for CT scan of the chest without contrast to further evaluate pneumonia and persistent back discomfort.  She has used multiple courses of antibiotics, antibiotics not indicated at this present time

## 2022-10-12 NOTE — Telephone Encounter (Addendum)
Patient was called. She went to [facility] for [facility]. Waiting for results. ----- Message from Langley Gauss sent at 10/12/2022  2:47 PM EDT ----- Regarding: Elevated D-dimer Call her and let her know we need her to go to [place] [facility] for a CTA today to rule out a PE. The test for a blood clot was elevated.   Her test for congestive heart failure was also elevated so we will send her to cardiology for them to do an echo.

## 2022-10-12 NOTE — Progress Notes (Unsigned)
Chest CT ordered for persistent chest discomfort, recent pneumonia

## 2022-10-12 NOTE — Assessment & Plan Note (Signed)
Patient has completed all antibiotics Sent for STAT labs at [facility]  Will perform CT chest if all negative.

## 2022-10-13 ENCOUNTER — Telehealth: Payer: Self-pay | Admitting: Pulmonary Disease

## 2022-10-13 ENCOUNTER — Encounter: Payer: Self-pay | Admitting: Physician Assistant

## 2022-10-13 DIAGNOSIS — M546 Pain in thoracic spine: Secondary | ICD-10-CM | POA: Diagnosis not present

## 2022-10-13 DIAGNOSIS — I440 Atrioventricular block, first degree: Secondary | ICD-10-CM | POA: Diagnosis not present

## 2022-10-13 DIAGNOSIS — I10 Essential (primary) hypertension: Secondary | ICD-10-CM | POA: Diagnosis not present

## 2022-10-13 DIAGNOSIS — M4850XG Collapsed vertebra, not elsewhere classified, site unspecified, subsequent encounter for fracture with delayed healing: Secondary | ICD-10-CM | POA: Diagnosis not present

## 2022-10-14 NOTE — Telephone Encounter (Signed)
See encounter from 9/27. Will close this encounter.

## 2022-10-16 ENCOUNTER — Other Ambulatory Visit: Payer: Self-pay | Admitting: Pulmonary Disease

## 2022-10-17 ENCOUNTER — Other Ambulatory Visit: Payer: Self-pay | Admitting: Family Medicine

## 2022-10-17 DIAGNOSIS — G4709 Other insomnia: Secondary | ICD-10-CM

## 2022-10-18 ENCOUNTER — Ambulatory Visit: Payer: Medicare Other | Admitting: Cardiology

## 2022-10-18 DIAGNOSIS — M4854XD Collapsed vertebra, not elsewhere classified, thoracic region, subsequent encounter for fracture with routine healing: Secondary | ICD-10-CM | POA: Diagnosis not present

## 2022-10-19 ENCOUNTER — Encounter: Payer: Self-pay | Admitting: Physician Assistant

## 2022-10-19 ENCOUNTER — Ambulatory Visit (INDEPENDENT_AMBULATORY_CARE_PROVIDER_SITE_OTHER): Payer: Medicare Other | Admitting: Physician Assistant

## 2022-10-19 VITALS — BP 108/70 | HR 81 | Temp 98.3°F | Ht 62.0 in | Wt 189.0 lb

## 2022-10-19 DIAGNOSIS — M810 Age-related osteoporosis without current pathological fracture: Secondary | ICD-10-CM | POA: Insufficient documentation

## 2022-10-19 NOTE — Assessment & Plan Note (Signed)
Discussed starting a biologic to help treat osteoporosis Looking into Evenity  Continue taking tylenol as needed for pain Will let us know if back pain gets worse or she begins to have worsening nerve pain.

## 2022-10-19 NOTE — Progress Notes (Addendum)
Subjective:  Patient ID: Diana Hendrix, female    DOB: 08/14/1945  Age: 77 y.o. MRN: [id number]  Chief Complaint  Patient presents with   Follow-up    HPI   Patient presents today for ED follow up. Was seen at [facility] for back pain CT did not show any pneumonia however compression fracture of T7-T8. Patient was tx'd with TLSO brace and analgesics. D/c with rx for robaxin 500 mg BID, Prednisone x 5 days, OxyIR 2.5 q8h as needed and Tessalon Pearls 100 mg THREE TIMES A DAY. Has seen [facility] who does not feel the surgical intervention is needed at this time. Orthopedics did rx calcitonin. Currently only taking tylenol for pain.     08/25/2022    8:39 AM 05/05/2022   10:03 AM 05/05/2022    9:33 AM 02/21/2022    3:44 PM 02/01/2022    9:36 AM  Depression screen PHQ 2/9  Decreased Interest 0 0 0 0 0  Down, Depressed, Hopeless 0 0 0 0 0  PHQ - 2 Score 0 0 0 0 0  Altered sleeping 3 0   0  Tired, decreased energy 0 0   0  Change in appetite 0 0   0  Feeling bad or failure about yourself  0 0   0  Trouble concentrating 0 0   0  Moving slowly or fidgety/restless 0 0   0  Suicidal thoughts 0 0   0  PHQ-9 Score 3 0   0  Difficult doing work/chores Somewhat difficult Not difficult at all   Not difficult at all        08/25/2022    8:39 AM  Fall Risk   Falls in the past year? 1  Number falls in past yr: 0  Injury with Fall? 1  Risk for fall due to : No Fall Risks  Follow up Falls evaluation completed  Comment fell 10/2021    Patient Care Team: Blane Ohara, MD as PCP - General (Family Medicine) Sinda Du, MD as Consulting Physician (Ophthalmology) Coralyn Helling, MD (Inactive) as Consulting Physician (Pulmonary Disease) Baldo Daub, MD as Consulting Physician (Cardiology) Lynann Bologna, MD as Consulting Physician (Gastroenterology)   Review of Systems  Constitutional:  Negative for chills, fatigue and fever.  HENT:  Negative for congestion, ear pain and sore throat.    Respiratory:  Negative for cough and shortness of breath.   Cardiovascular:  Negative for chest pain and palpitations.  Gastrointestinal:  Negative for abdominal pain, constipation, diarrhea, nausea and vomiting.  Genitourinary:  Negative for difficulty urinating and dysuria.  Musculoskeletal:  Positive for back pain. Negative for arthralgias and myalgias.  Skin:  Negative for rash.  Neurological:  Negative for dizziness and headaches.  Psychiatric/Behavioral:  Negative for dysphoric mood.     Current Outpatient Medications on File Prior to Visit  Medication Sig Dispense Refill   calcitonin, salmon, (MIACALCIN/FORTICAL) 200 UNIT/ACT nasal spray 1 spray daily.     metoprolol tartrate (LOPRESSOR) 25 MG tablet Take 25 mg by mouth daily.     albuterol (VENTOLIN HFA) 108 (90 Base) MCG/ACT inhaler Inhale 2 puffs into the lungs every 6 (six) hours as needed for wheezing or shortness of breath. 8 g 1   Cholecalciferol (VITAMIN D3) 50 MCG (2000 UT) TABS Take 1 tablet by mouth daily.     citalopram (CELEXA) 10 MG tablet Take 1 tablet (10 mg total) by mouth daily. 90 tablet 3   furosemide (LASIX) 20 MG tablet  Take 1 tablet (20 mg total) by mouth daily. 90 tablet 3   loratadine (CLARITIN) 10 MG tablet Take 10 mg by mouth daily.     montelukast (SINGULAIR) 10 MG tablet TAKE 1 TABLET(10 MG) BY MOUTH AT BEDTIME 90 tablet 1   NON FORMULARY CPAP at bedtime     olmesartan (BENICAR) 20 MG tablet Take 1 tablet (20 mg total) by mouth daily. 90 tablet 3   omega-3 acid ethyl esters (LOVAZA) 1 g capsule Take 2 capsules (2 g total) by mouth 2 (two) times daily. 360 capsule 2   omeprazole (PRILOSEC) 40 MG capsule Take 40 mg by mouth daily.     Polyethyl Glycol-Propyl Glycol (SYSTANE OP) Place 1 drop into both eyes 4 (four) times daily.     polyethylene glycol (MIRALAX / GLYCOLAX) 17 g packet Take 17 g by mouth daily.     rosuvastatin (CRESTOR) 40 MG tablet Take 1 tablet (40 mg total) by mouth daily. 90 tablet 1    zolpidem (AMBIEN) 5 MG tablet TAKE 1 TABLET(5 MG) BY MOUTH AT BEDTIME AS NEEDED FOR SLEEP 30 tablet 5   No current facility-administered medications on file prior to visit.   Past Medical History:  Diagnosis Date   Cancer (HCC)    Diabetes mellitus without complication (HCC)    Diastolic heart failure (HCC)    Echo EF 60/65% Heart monitor was normal   GERD (gastroesophageal reflux disease)    History of colon polyps    Hyperlipidemia    Hypertension    Macular edema, cystoid    OSA (obstructive sleep apnea) 09/17/2012   Osteopenia    Plantar fasciitis 09/09/2019   Past Surgical History:  Procedure Laterality Date   CESAREAN SECTION     COLONOSCOPY  10/29/2013   Colonic polyp status post polypectomy. Mild sigmoid diverticulosis. Small internal hemorrhoids   FOOT NEUROMA SURGERY     s/p surgery in both feet   FOOT SURGERY Bilateral    Per patient   MENISCUS REPAIR Left 09/13/2016   miniscus repair right  Right 2019   SHOULDER SURGERY Left    arthroscopy.    TUBAL LIGATION      Family History  Problem Relation Age of Onset   Hyperlipidemia Mother    Heart disease Mother    Hypertension Mother    Atrial fibrillation Mother    CAD Mother    Congestive Heart Failure Mother    CVA Father    Hypertension Brother    Hypertension Son    Colon cancer Neg Hx    Esophageal cancer Neg Hx    Social History   Socioeconomic History   Marital status: Married    Spouse name: Engineer, production   Number of children: 2   Years of education: Not on file   Highest education level: Not on file  Occupational History   Not on file  Tobacco Use   Smoking status: Never   Smokeless tobacco: Never  Vaping Use   Vaping status: Never Used  Substance and Sexual Activity   Alcohol use: No   Drug use: No   Sexual activity: Not Currently  Other Topics Concern   Not on file  Social History Narrative   Lives with husband   Right handed   Caffeine: 1 cup of tea and 1 pepsi in the AM    Social Determinants of Health   Financial Resource Strain: Low Risk  (01/23/2022)   Overall Financial Resource Strain (CARDIA)    Difficulty  of Paying Living Expenses: Not hard at all  Food Insecurity: No Food Insecurity (01/23/2022)   Hunger Vital Sign    Worried About Running Out of Food in the Last Year: Never true    Ran Out of Food in the Last Year: Never true  Transportation Needs: No Transportation Needs (01/23/2022)   PRAPARE - Administrator, Civil Service (Medical): No    Lack of Transportation (Non-Medical): No  Physical Activity: Sufficiently Active (08/25/2022)   Exercise Vital Sign    Days of Exercise per Week: 4 days    Minutes of Exercise per Session: 60 min  Stress: No Stress Concern Present (01/23/2022)   Diana Hendrix    Feeling of Stress : Not at all  Social Connections: Moderately Integrated (01/23/2022)   Social Connection and Isolation Panel [NHANES]    Frequency of Communication with Friends and Family: More than three times a week    Frequency of Social Gatherings with Friends and Family: Once a week    Attends Religious Services: 1 to 4 times per year    Active Member of [employer] or Organizations: No    Attends Engineer, structural: Never    Marital Status: Married    Objective:  BP 108/70   Pulse 81   Temp 98.3 F (36.8 C)   Ht 5\' 2"  (1.575 m)   Wt 189 lb (85.7 kg)   SpO2 98%   BMI 34.57 kg/m      10/19/2022    1:32 PM 10/12/2022   10:39 AM 10/02/2022    3:16 PM  BP/Weight  Systolic BP 108 120 110  Diastolic BP 70 70 70  Wt. (Lbs) 189 195 195.4  BMI 34.57 kg/m2 35.67 kg/m2 35.74 kg/m2    Physical Exam Vitals reviewed.  Constitutional:      Appearance: Normal appearance. She is normal weight.  Cardiovascular:     Rate and Rhythm: Normal rate and regular rhythm.     Heart sounds: Normal heart sounds.  Pulmonary:     Effort: Pulmonary effort is normal. No  respiratory distress.     Breath sounds: Normal breath sounds.  Abdominal:     General: Abdomen is flat. Bowel sounds are normal.     Palpations: Abdomen is soft.     Tenderness: There is no abdominal tenderness.  Musculoskeletal:     Thoracic back: Bony tenderness present.  Neurological:     Mental Status: She is alert and oriented to person, place, and time.  Psychiatric:        Mood and Affect: Mood normal.        Behavior: Behavior normal.     Diabetic Foot Exam - Simple   No data filed      Lab Results  Component Value Date   WBC 5.2 08/25/2022   HGB 13.8 08/25/2022   HCT 42.3 08/25/2022   PLT 252 08/25/2022   GLUCOSE 103 (H) 08/25/2022   CHOL 156 08/25/2022   TRIG 134 08/25/2022   HDL 53 08/25/2022   LDLCALC 80 08/25/2022   ALT 22 08/25/2022   AST 25 08/25/2022   NA 139 08/25/2022   K 4.8 08/25/2022   CL 100 08/25/2022   CREATININE 1.10 (H) 08/25/2022   BUN 15 08/25/2022   CO2 23 08/25/2022   TSH 1.810 02/09/2021   INR 1.1 11/09/2021   HGBA1C 6.6 (H) 08/25/2022    Total time spent on today's visit was greater than  30 minutes, including both face-to-face time and nonface-to-face time personally spent on review of chart (labs and imaging), discussing labs and goals, discussing further work-up, treatment options, referrals to specialist if needed, reviewing outside records of pertinent, answering patient's questions, and coordinating care.   Assessment & Plan:    Osteoporosis of vertebra Assessment & Plan: Discussed starting a biologic to help treat osteoporosis Looking into Evenity  Continue taking tylenol as needed for pain Will let us know if back pain gets worse or she begins to have worsening nerve pain.   Orders: -     HM DEXA SCAN     No orders of the defined types were placed in this encounter.   Orders Placed This Encounter  Procedures   HM DEXA SCAN     Follow-up: No follow-ups on file.   I,Katherina A Bramblett,acting as a scribe  for [employer], PA.,have documented all relevant documentation on the behalf of Diana Gauss, PA,as directed by  Diana Gauss, PA while in the presence of Diana Hendrix, [place].   An After Visit Summary was printed and given to the patient.  Diana Hendrix, [place] [facility] [phone]

## 2022-10-19 NOTE — Addendum Note (Signed)
Addended by: Langley Gauss on: 10/19/2022 08:15 PM   Modules accepted: Level of Service

## 2022-10-24 ENCOUNTER — Encounter: Payer: Self-pay | Admitting: Physician Assistant

## 2022-10-27 ENCOUNTER — Other Ambulatory Visit: Payer: Medicare Other

## 2022-10-31 ENCOUNTER — Telehealth: Payer: Self-pay

## 2022-10-31 NOTE — Telephone Encounter (Signed)
Patient: Diana Hendrix  DOB: 1945-01-19  MRN: [id number]  Prolia benefits verified by [employer] on: 10/30/22 Coverage Available: Buy & Bill Prior authorization NOT REQUIRED Deductible: $240 of $240 met  Benefit Details: Prolia and administration will be subject to a $240.00 deductible ($240.00 met) and 20.0% coinsurance.  For the Secondary MD Purchase access option, patient has met their Medicare Deductible and the Standard Supplemental Plan they purchased covers their entire Medicare Co-Insurance Amount.  Patient agreed to proceed with ordering and will be scheduled once it has arrived in the office.  Jacklynn Bue, LPN

## 2022-11-02 DIAGNOSIS — D225 Melanocytic nevi of trunk: Secondary | ICD-10-CM | POA: Diagnosis not present

## 2022-11-02 DIAGNOSIS — L738 Other specified follicular disorders: Secondary | ICD-10-CM | POA: Diagnosis not present

## 2022-11-02 DIAGNOSIS — D485 Neoplasm of uncertain behavior of skin: Secondary | ICD-10-CM | POA: Diagnosis not present

## 2022-11-02 DIAGNOSIS — D2339 Other benign neoplasm of skin of other parts of face: Secondary | ICD-10-CM | POA: Diagnosis not present

## 2022-11-02 DIAGNOSIS — L820 Inflamed seborrheic keratosis: Secondary | ICD-10-CM | POA: Diagnosis not present

## 2022-11-02 DIAGNOSIS — L821 Other seborrheic keratosis: Secondary | ICD-10-CM | POA: Diagnosis not present

## 2022-11-08 ENCOUNTER — Other Ambulatory Visit: Payer: Self-pay | Admitting: Family Medicine

## 2022-11-08 ENCOUNTER — Ambulatory Visit (INDEPENDENT_AMBULATORY_CARE_PROVIDER_SITE_OTHER): Payer: Medicare Other

## 2022-11-08 DIAGNOSIS — M810 Age-related osteoporosis without current pathological fracture: Secondary | ICD-10-CM | POA: Diagnosis not present

## 2022-11-08 MED ORDER — DENOSUMAB 60 MG/ML ~~LOC~~ SOSY
60.0000 mg | PREFILLED_SYRINGE | Freq: Once | SUBCUTANEOUS | Status: AC
Start: 2022-11-08 — End: 2022-11-08
  Administered 2022-11-08: 60 mg via SUBCUTANEOUS

## 2022-11-08 NOTE — Progress Notes (Signed)
Patient tolerated well Prolia injection. Today it was her first prolia shot, she waited for 15 minutes. No reaction.

## 2022-11-12 ENCOUNTER — Other Ambulatory Visit: Payer: Self-pay | Admitting: Family Medicine

## 2022-11-12 DIAGNOSIS — J301 Allergic rhinitis due to pollen: Secondary | ICD-10-CM

## 2022-11-13 ENCOUNTER — Encounter (INDEPENDENT_AMBULATORY_CARE_PROVIDER_SITE_OTHER): Payer: Self-pay

## 2022-11-13 ENCOUNTER — Ambulatory Visit (INDEPENDENT_AMBULATORY_CARE_PROVIDER_SITE_OTHER): Payer: Medicare Other | Admitting: Otolaryngology

## 2022-11-13 VITALS — Ht 62.0 in | Wt 190.3 lb

## 2022-11-13 DIAGNOSIS — R0981 Nasal congestion: Secondary | ICD-10-CM

## 2022-11-13 DIAGNOSIS — J3489 Other specified disorders of nose and nasal sinuses: Secondary | ICD-10-CM | POA: Diagnosis not present

## 2022-11-13 DIAGNOSIS — J343 Hypertrophy of nasal turbinates: Secondary | ICD-10-CM

## 2022-11-13 DIAGNOSIS — J342 Deviated nasal septum: Secondary | ICD-10-CM

## 2022-11-13 DIAGNOSIS — H04209 Unspecified epiphora, unspecified lacrimal gland: Secondary | ICD-10-CM

## 2022-11-13 MED ORDER — IPRATROPIUM BROMIDE 0.06 % NA SOLN: 2.0000 | Freq: Two times a day (BID) | NASAL | 5 refills | Status: DC

## 2022-11-13 NOTE — Progress Notes (Signed)
Dear Dr. Andria Meuse, Here is my assessment for our mutual patient, Diana Hendrix. Thank you for allowing me the opportunity to care for your patient. Please do not hesitate to contact me should you have any other questions. Sincerely, Dr. Jovita Kussmaul  Otolaryngology Clinic Note Referring provider: Dr. Andria Meuse HPI:  Diana Hendrix is a 77 y.o. female kindly referred by Dr. Andria Meuse for evaluation of chronic sinusitis. She reports that she has had bilateral nasal drainage for past few months.  She complains of epiphora (clear, not discolored), and eye drainage but no pain. She also reports bilateral anterior rhinorrhea which is mucoid (not enough to collect, feels like she has to blow her nose, not triggered by valsalva). Most nasal complaints are during the morning. She denies post nasal drip. She reports that it has been ongoing on for about 6-7 months and eyes have been watering for about 3 months. Sometimes the eye itches.No facial pressure or pain. No discolored drainage from nose. Sense of smell intact. No headaches or vision changes. Right nasal obstruction but has been a problem for years. Does use CPAP - nasal. Has been problematic to use since her injury. No triggers by perfume or pressure changes.  No significant history of sinus infection. Does not know if she has had allergies. No cough.   She has used claritin, zyrtec, allegra, and tried benadryl. She has tried flonase and astelin (tried them several months) but without did not help. Has used nasal saline rinses PRN but does help.   She does see Pulm for PNA recently and got prednisone and abx (this past summer). Now recovered  She has seen Dr. Annalee Genta for septal deviation/AR in the past and Dr. Verdie Drown in [place] for epistaxis PMHx: Asthma, OSA  H&N Surgery: no Personal or FHx of bleeding dz or anesthesia difficulty: no  PMHx: Osteoporosis, HLD, HTN, Diabetes Not on AP/AC  Independent Review of Additional Tests or Records:  CT Face  10/2021: Right NB fx; left dev septum with right large spur; some blood (likely) left max but otheriwse no significant disease. No obvious large masses or erosion around NL duct on right    Pulm notes and PFTs: 04/2022: no obstruction; FeNo 32; mild OSA PMH/Meds/All/SocHx/FamHx/ROS:   Past Medical History:  Diagnosis Date   Cancer (HCC)    Diabetes mellitus without complication (HCC)    Diastolic heart failure (HCC)    Echo EF 60/65% Heart monitor was normal   GERD (gastroesophageal reflux disease)    History of colon polyps    Hyperlipidemia    Hypertension    Macular edema, cystoid    OSA (obstructive sleep apnea) 09/17/2012   Osteopenia    Plantar fasciitis 09/09/2019    Past Surgical History:  Procedure Laterality Date   CESAREAN SECTION     COLONOSCOPY  10/29/2013   Colonic polyp status post polypectomy. Mild sigmoid diverticulosis. Small internal hemorrhoids   FOOT NEUROMA SURGERY     s/p surgery in both feet   FOOT SURGERY Bilateral    Per patient   MENISCUS REPAIR Left 09/13/2016   miniscus repair right  Right 2019   SHOULDER SURGERY Left    arthroscopy.    TUBAL LIGATION      Family History  Problem Relation Age of Onset   Hyperlipidemia Mother    Heart disease Mother    Hypertension Mother    Atrial fibrillation Mother    CAD Mother    Congestive Heart Failure Mother    CVA Father  Hypertension Brother    Hypertension Son    Colon cancer Neg Hx    Esophageal cancer Neg Hx      Social Connections: Moderately Integrated (01/23/2022)   Social Connection and Isolation Panel [NHANES]    Frequency of Communication with Friends and Family: More than three times a week    Frequency of Social Gatherings with Friends and Family: Once a week    Attends Religious Services: 1 to 4 times per year    Active Member of [employer] or Organizations: No    Attends Engineer, structural: Never    Marital Status: Married      Current Outpatient Medications:     albuterol (VENTOLIN HFA) 108 (90 Base) MCG/ACT inhaler, Inhale 2 puffs into the lungs every 6 (six) hours as needed for wheezing or shortness of breath., Disp: 8 g, Rfl: 1   calcitonin, salmon, (MIACALCIN/FORTICAL) 200 UNIT/ACT nasal spray, 1 spray daily., Disp: , Rfl:    Cholecalciferol (VITAMIN D3) 50 MCG (2000 UT) TABS, Take 1 tablet by mouth daily., Disp: , Rfl:    citalopram (CELEXA) 10 MG tablet, Take 1 tablet (10 mg total) by mouth daily., Disp: 90 tablet, Rfl: 3   furosemide (LASIX) 20 MG tablet, Take 1 tablet (20 mg total) by mouth daily., Disp: 90 tablet, Rfl: 3   metoprolol tartrate (LOPRESSOR) 25 MG tablet, Take 25 mg by mouth daily., Disp: , Rfl:    montelukast (SINGULAIR) 10 MG tablet, TAKE 1 TABLET(10 MG) BY MOUTH AT BEDTIME, Disp: 90 tablet, Rfl: 1   NON FORMULARY, CPAP at bedtime, Disp: , Rfl:    olmesartan (BENICAR) 20 MG tablet, Take 1 tablet (20 mg total) by mouth daily., Disp: 90 tablet, Rfl: 3   omega-3 acid ethyl esters (LOVAZA) 1 g capsule, Take 2 capsules (2 g total) by mouth 2 (two) times daily., Disp: 360 capsule, Rfl: 2   omeprazole (PRILOSEC) 40 MG capsule, TAKE 1 CAPSULE(40 MG) BY MOUTH IN THE MORNING AND AT BEDTIME, Disp: 180 capsule, Rfl: 0   Polyethyl Glycol-Propyl Glycol (SYSTANE OP), Place 1 drop into both eyes 4 (four) times daily., Disp: , Rfl:    polyethylene glycol (MIRALAX / GLYCOLAX) 17 g packet, Take 17 g by mouth daily., Disp: , Rfl:    rosuvastatin (CRESTOR) 40 MG tablet, Take 1 tablet (40 mg total) by mouth daily., Disp: 90 tablet, Rfl: 1   zolpidem (AMBIEN) 5 MG tablet, TAKE 1 TABLET(5 MG) BY MOUTH AT BEDTIME AS NEEDED FOR SLEEP, Disp: 30 tablet, Rfl: 5   loratadine (CLARITIN) 10 MG tablet, Take 10 mg by mouth daily. (Patient not taking: Reported on 11/13/2022), Disp: , Rfl:    Physical Exam:   Ht 5\' 2"  (1.575 m)   Wt 190 lb 4.8 oz (86.3 kg)   BMI 34.81 kg/m    Salient findings:  CN II-XII intact No epiphora on exam today; no chemosis or  conjunctival injection, EOM intact  Bilateral EAC clear and TM intact with well pneumatized middle ear spaces Anterior rhinoscopy: Septum with deviation to the right with a spur with more dorsal deviation left; bilateral inferior turbinates with modest hypertrophy No lesions of oral cavity/oropharynx; dentition fair No obviously palpable neck masses/lymphadenopathy/thyromegaly No respiratory distress or stridor  Procedures:  PROCEDURE: Bilateral Diagnostic Rigid Nasal Endoscopy Pre-procedure diagnosis: Rhinorrhea, epiphora, concern for chronic sinusitis Post-procedure diagnosis: same Indication: See pre-procedure diagnosis and physical exam above Complications: None apparent EBL: 0 mL Anesthesia: Lidocaine 4% and topical decongestant was topically sprayed in  each nasal cavity  Description of Procedure:  Patient was identified. A rigid 0 degree endoscope was utilized to evaluate the sinonasal cavities, mucosa, sinus ostia and turbinates and septum.  Overall, mild nasal dryness. Also noted is Septum with deviation to the right with a spur with more dorsal deviation left; bilateral inferior turbinates with modest hypertrophy.  No mucopurulence, polyps, or masses noted.  Left nasolacrimal area without obvious mucosal abnormalities Right Middle meatus: clear Right SE Recess: clera Left MM: clear Left SE Recess: clear Photodocumentation was obtained.  CPT CODE -- 16109 - Mod 25  Impression & Plans:  Hanny Coltrin is a 77 y.o. female with h/o PNA now with Concern for epiphora Anterior mucoid rhinorrhea Nasal septal deviation B/l inferior turbinate hyertrophy - No other significant sinonasal symptoms and episodes are not concerning for CSF leak. She also reports that it is not enough to collect the drainage. She does report that part of the issue is that she feels like she is not getting enough air in her nose after nasal bone trauma, and that makes her want to blow her nose. She does use  CPAP and does not have vasomotor symptoms. I wonder if this is related to her CPAP potentially given time of symptoms, and she has tried multiple allergy medications concurently without benefit. Does not sound like LPR (no other sx like globus or throat clearing)  As such, we will try atrovent 0.06% BID PRN 2 sprays each nostril to see if beneficial Daily NeilMed Sinus rinses I also advised her to see Ophthalmology (she has one) to ensure NL duct patency - no other concerning symptoms  - f/u in 2 months; if fails, can pursue allergy testing and CT perhaps; wonder if related to septal deviation and that reduction in airflow is making her blow more - can consider septo/ITR  MDM:  Level 4: 99204 Complexity/Problems addressed: mod Data complexity: mod - independent interpret - Morbidity: mod - Prescription Drug prescribed or managed: yes     Thank you for allowing me the opportunity to care for your patient. Please do not hesitate to contact me should you have any other questions.  Sincerely, Jovita Kussmaul, MD Otolarynoglogist (ENT), [facility] ENT Specialist Phone: [phone] Fax: [phone]  11/13/2022, 10:34 AM

## 2022-11-14 DIAGNOSIS — H04122 Dry eye syndrome of left lacrimal gland: Secondary | ICD-10-CM | POA: Diagnosis not present

## 2022-11-14 DIAGNOSIS — H04121 Dry eye syndrome of right lacrimal gland: Secondary | ICD-10-CM | POA: Diagnosis not present

## 2022-11-19 ENCOUNTER — Other Ambulatory Visit: Payer: Self-pay | Admitting: Family Medicine

## 2022-11-19 DIAGNOSIS — E782 Mixed hyperlipidemia: Secondary | ICD-10-CM

## 2022-11-21 DIAGNOSIS — M546 Pain in thoracic spine: Secondary | ICD-10-CM | POA: Insufficient documentation

## 2022-11-24 DIAGNOSIS — M4854XA Collapsed vertebra, not elsewhere classified, thoracic region, initial encounter for fracture: Secondary | ICD-10-CM | POA: Diagnosis not present

## 2022-11-24 DIAGNOSIS — M7061 Trochanteric bursitis, right hip: Secondary | ICD-10-CM | POA: Diagnosis not present

## 2022-12-04 ENCOUNTER — Encounter: Payer: Self-pay | Admitting: Physician Assistant

## 2022-12-04 ENCOUNTER — Ambulatory Visit (INDEPENDENT_AMBULATORY_CARE_PROVIDER_SITE_OTHER): Payer: Medicare Other | Admitting: Physician Assistant

## 2022-12-04 VITALS — BP 120/70 | HR 70 | Temp 97.0°F | Ht 62.0 in | Wt 192.0 lb

## 2022-12-04 DIAGNOSIS — J3489 Other specified disorders of nose and nasal sinuses: Secondary | ICD-10-CM

## 2022-12-04 DIAGNOSIS — M7061 Trochanteric bursitis, right hip: Secondary | ICD-10-CM | POA: Diagnosis not present

## 2022-12-04 DIAGNOSIS — S22088S Other fracture of T11-T12 vertebra, sequela: Secondary | ICD-10-CM | POA: Diagnosis not present

## 2022-12-04 DIAGNOSIS — Z23 Encounter for immunization: Secondary | ICD-10-CM | POA: Insufficient documentation

## 2022-12-04 DIAGNOSIS — G8929 Other chronic pain: Secondary | ICD-10-CM

## 2022-12-04 DIAGNOSIS — R071 Chest pain on breathing: Secondary | ICD-10-CM | POA: Diagnosis not present

## 2022-12-04 DIAGNOSIS — M7071 Other bursitis of hip, right hip: Secondary | ICD-10-CM | POA: Insufficient documentation

## 2022-12-04 DIAGNOSIS — R0981 Nasal congestion: Secondary | ICD-10-CM

## 2022-12-04 DIAGNOSIS — R109 Unspecified abdominal pain: Secondary | ICD-10-CM

## 2022-12-04 DIAGNOSIS — H35353 Cystoid macular degeneration, bilateral: Secondary | ICD-10-CM

## 2022-12-04 HISTORY — DX: Other chronic pain: G89.29

## 2022-12-04 LAB — POCT URINALYSIS DIP (CLINITEK)
Bilirubin, UA: NEGATIVE
Blood, UA: NEGATIVE
Glucose, UA: NEGATIVE mg/dL
Ketones, POC UA: NEGATIVE mg/dL
Leukocytes, UA: NEGATIVE
Nitrite, UA: NEGATIVE
POC PROTEIN,UA: NEGATIVE
Spec Grav, UA: 1.01 (ref 1.010–1.025)
Urobilinogen, UA: 0.2 U/dL
pH, UA: 6 (ref 5.0–8.0)

## 2022-12-04 NOTE — Assessment & Plan Note (Signed)
History of fractured back. Pain radiating to the rib. MRI scheduled to evaluate for nerve impingement. -Continue Tramadol as needed for pain. -Follow up with back specialist after MRI results.

## 2022-12-04 NOTE — Assessment & Plan Note (Signed)
UA negative Continue to monitor symptoms Possibly tied to vertebral fractures Scheduled for MRI Will follow up with back and spine for possible surgery

## 2022-12-04 NOTE — Assessment & Plan Note (Signed)
Chest Tightness and Difficulty Breathing Persistent symptoms with recent worsening. Wheezing noted. No fever or productive cough. Albuterol use has been inconsistent and partially effective. No clear cardiac symptoms. -Continue Albuterol inhaler as prescribed (every 6 hours). -Notify office if symptoms do not improve within a week. -Consider further cardiac evaluation if lung symptoms do not improve with consistent Albuterol use.

## 2022-12-04 NOTE — Progress Notes (Signed)
Subjective:  Patient ID: Diana Hendrix, female    DOB: 1945/04/02  Age: 77 y.o. MRN: [id number]  Chief Complaint  Patient presents with   Pneumonia    HPI   Patient is here for follow up of pneumonia. She is said is having wheezing and chest tightness. She is not feel good. Discussed the use of AI scribe software for clinical note transcription with the patient, who gave verbal consent to proceed.  History of Present Illness   The patient, with a history of a fractured back, presented with persistent chest tightness and difficulty breathing. The patient reported that these symptoms have worsened over the past few weeks, with the addition of wheezing and congestion. The patient denied any fever but admitted to occasional coughing without expectoration.  The patient also reported persistent watering of the eyes for several months, which has been evaluated by an ENT specialist and an ophthalmologist. The patient was diagnosed with a deviated septum and dry eyes, and was prescribed nasal spray and eye drops, respectively. However, the patient reported inconsistent relief from the nasal spray and no improvement in the eye symptoms despite treatment.  The patient expressed concern about potential congestive heart failure, citing a recent weight gain of three pounds over a few days. However, the patient denied any leg swelling or pain. The patient's chest discomfort was not described as pain, but rather as a tightness that the patient was unsure whether it was related to their known fractured back or a pulmonary issue.  The patient also reported a localized pain around the rib area, which they described as feeling like a fractured rib. The pain was initially circumferential but has since localized to one spot. The patient was referred to pain management for this issue, where it was suggested that the pain could be related to back inflammation. An MRI was scheduled to further evaluate this.  The  patient also reported a persistent ache in the hip, radiating down to the knee, which was suggested to be bursitis by the pain management specialist. The patient reported that the pain worsens with walking and eases off after a few steps. The patient has been managing this pain with tramadol as needed and has been applying Voltaren gel as suggested by the pain back surgeon.  The patient also reported a recent onset of hand tremors, described as a jittery sensation, which has been ongoing since the onset of the current symptoms. The patient denied any difficulty in gripping or holding onto things. The patient has been using an albuterol inhaler for the chest tightness and difficulty breathing, which they reported as providing some relief. The patient has also been taking Benadryl for the runny nose and eye symptoms.          08/25/2022    8:39 AM 05/05/2022   10:03 AM 05/05/2022    9:33 AM 02/21/2022    3:44 PM 02/01/2022    9:36 AM  Depression screen PHQ 2/9  Decreased Interest 0 0 0 0 0  Down, Depressed, Hopeless 0 0 0 0 0  PHQ - 2 Score 0 0 0 0 0  Altered sleeping 3 0   0  Tired, decreased energy 0 0   0  Change in appetite 0 0   0  Feeling bad or failure about yourself  0 0   0  Trouble concentrating 0 0   0  Moving slowly or fidgety/restless 0 0   0  Suicidal thoughts 0 0  0  PHQ-9 Score 3 0   0  Difficult doing work/chores Somewhat difficult Not difficult at all   Not difficult at all        08/25/2022    8:39 AM  Fall Risk   Falls in the past year? 1  Number falls in past yr: 0  Injury with Fall? 1  Risk for fall due to : No Fall Risks  Follow up Falls evaluation completed  Comment fell 10/2021    Patient Care Team: Blane Ohara, MD as PCP - General (Family Medicine) Sinda Du, MD as Consulting Physician (Ophthalmology) Coralyn Helling, MD (Inactive) as Consulting Physician (Pulmonary Disease) Baldo Daub, MD as Consulting Physician (Cardiology) Lynann Bologna, MD as  Consulting Physician (Gastroenterology)   Review of Systems  Constitutional:  Negative for chills, fatigue and fever.  HENT:  Positive for postnasal drip and rhinorrhea. Negative for congestion, ear pain and sore throat.   Eyes:  Positive for itching.  Respiratory:  Positive for chest tightness and wheezing. Negative for cough and shortness of breath.   Cardiovascular:  Negative for chest pain and palpitations.  Gastrointestinal:  Negative for abdominal pain, constipation, diarrhea, nausea and vomiting.  Endocrine: Negative for polydipsia, polyphagia and polyuria.  Genitourinary:  Positive for flank pain. Negative for difficulty urinating and dysuria.  Musculoskeletal:  Positive for arthralgias and myalgias. Negative for back pain.  Skin:  Negative for rash.  Neurological:  Negative for headaches.  Psychiatric/Behavioral:  Negative for dysphoric mood. The patient is not nervous/anxious.     Current Outpatient Medications on File Prior to Visit  Medication Sig Dispense Refill   albuterol (VENTOLIN HFA) 108 (90 Base) MCG/ACT inhaler Inhale 2 puffs into the lungs every 6 (six) hours as needed for wheezing or shortness of breath. 8 g 1   calcitonin, salmon, (MIACALCIN/FORTICAL) 200 UNIT/ACT nasal spray 1 spray daily.     Cholecalciferol (VITAMIN D3) 50 MCG (2000 UT) TABS Take 1 tablet by mouth daily.     citalopram (CELEXA) 10 MG tablet Take 1 tablet (10 mg total) by mouth daily. 90 tablet 3   furosemide (LASIX) 20 MG tablet Take 1 tablet (20 mg total) by mouth daily. 90 tablet 3   ipratropium (ATROVENT) 0.06 % nasal spray Place 2 sprays into both nostrils in the morning and at bedtime. 15 mL 5   loratadine (CLARITIN) 10 MG tablet Take 10 mg by mouth daily.     metoprolol tartrate (LOPRESSOR) 25 MG tablet Take 25 mg by mouth daily.     montelukast (SINGULAIR) 10 MG tablet TAKE 1 TABLET(10 MG) BY MOUTH AT BEDTIME 90 tablet 1   NON FORMULARY CPAP at bedtime     olmesartan (BENICAR) 20 MG  tablet Take 1 tablet (20 mg total) by mouth daily. 90 tablet 3   omega-3 acid ethyl esters (LOVAZA) 1 g capsule Take 2 capsules (2 g total) by mouth 2 (two) times daily. 360 capsule 2   omeprazole (PRILOSEC) 40 MG capsule TAKE 1 CAPSULE(40 MG) BY MOUTH IN THE MORNING AND AT BEDTIME 180 capsule 0   Polyethyl Glycol-Propyl Glycol (SYSTANE OP) Place 1 drop into both eyes 4 (four) times daily.     polyethylene glycol (MIRALAX / GLYCOLAX) 17 g packet Take 17 g by mouth daily.     rosuvastatin (CRESTOR) 40 MG tablet TAKE 1 TABLET(40 MG) BY MOUTH DAILY 90 tablet 1   traMADol (ULTRAM) 50 MG tablet Take 1 tablet every day by oral route as needed for 15  days.     zolpidem (AMBIEN) 5 MG tablet TAKE 1 TABLET(5 MG) BY MOUTH AT BEDTIME AS NEEDED FOR SLEEP 30 tablet 5   No current facility-administered medications on file prior to visit.   Past Medical History:  Diagnosis Date   Cancer (HCC)    Diabetes mellitus without complication (HCC)    Diastolic heart failure (HCC)    Echo EF 60/65% Heart monitor was normal   GERD (gastroesophageal reflux disease)    History of colon polyps    Hyperlipidemia    Hypertension    Macular edema, cystoid    OSA (obstructive sleep apnea) 09/17/2012   Osteopenia    Plantar fasciitis 09/09/2019   Past Surgical History:  Procedure Laterality Date   CESAREAN SECTION     COLONOSCOPY  10/29/2013   Colonic polyp status post polypectomy. Mild sigmoid diverticulosis. Small internal hemorrhoids   FOOT NEUROMA SURGERY     s/p surgery in both feet   FOOT SURGERY Bilateral    Per patient   MENISCUS REPAIR Left 09/13/2016   miniscus repair right  Right 2019   SHOULDER SURGERY Left    arthroscopy.    TUBAL LIGATION      Family History  Problem Relation Age of Onset   Hyperlipidemia Mother    Heart disease Mother    Hypertension Mother    Atrial fibrillation Mother    CAD Mother    Congestive Heart Failure Mother    CVA Father    Hypertension Brother     Hypertension Son    Colon cancer Neg Hx    Esophageal cancer Neg Hx    Social History   Socioeconomic History   Marital status: Married    Spouse name: Engineer, production   Number of children: 2   Years of education: Not on file   Highest education level: Not on file  Occupational History   Not on file  Tobacco Use   Smoking status: Never   Smokeless tobacco: Never  Vaping Use   Vaping status: Never Used  Substance and Sexual Activity   Alcohol use: No   Drug use: No   Sexual activity: Not Currently  Other Topics Concern   Not on file  Social History Narrative   Lives with husband   Right handed   Caffeine: 1 cup of tea and 1 pepsi in the AM   Social Determinants of Health   Financial Resource Strain: Low Risk  (01/23/2022)   Overall Financial Resource Strain (CARDIA)    Difficulty of Paying Living Expenses: Not hard at all  Food Insecurity: No Food Insecurity (01/23/2022)   Hunger Vital Sign    Worried About Running Out of Food in the Last Year: Never true    Ran Out of Food in the Last Year: Never true  Transportation Needs: No Transportation Needs (01/23/2022)   PRAPARE - Administrator, Civil Service (Medical): No    Lack of Transportation (Non-Medical): No  Physical Activity: Sufficiently Active (08/25/2022)   Exercise Vital Sign    Days of Exercise per Week: 4 days    Minutes of Exercise per Session: 60 min  Stress: No Stress Concern Present (01/23/2022)   [employer] of Occupational Health - Occupational Stress Questionnaire    Feeling of Stress : Not at all  Social Connections: Moderately Integrated (01/23/2022)   Social Connection and Isolation Panel [NHANES]    Frequency of Communication with Friends and Family: More than three times a week  Frequency of Social Gatherings with Friends and Family: Once a week    Attends Religious Services: 1 to 4 times per year    Active Member of [employer] or Organizations: No    Attends Engineer, structural:  Never    Marital Status: Married    Objective:  BP 120/70   Pulse 70   Temp (!) 97 F (36.1 C)   Ht 5\' 2"  (1.575 m)   Wt 192 lb (87.1 kg)   SpO2 96%   BMI 35.12 kg/m      12/04/2022   11:20 AM 11/13/2022   10:24 AM 10/19/2022    1:32 PM  BP/Weight  Systolic BP 120  846  Diastolic BP 70  70  Wt. (Lbs) 192 190.3 189  BMI 35.12 kg/m2 34.81 kg/m2 34.57 kg/m2    Physical Exam Vitals reviewed.  Constitutional:      Appearance: Normal appearance.  Cardiovascular:     Rate and Rhythm: Normal rate and regular rhythm.     Heart sounds: Normal heart sounds.  Pulmonary:     Effort: Pulmonary effort is normal.     Breath sounds: Normal breath sounds.  Abdominal:     General: Bowel sounds are normal.     Palpations: Abdomen is soft.     Tenderness: There is no abdominal tenderness.  Neurological:     Mental Status: She is alert and oriented to person, place, and time.  Psychiatric:        Mood and Affect: Mood normal.        Behavior: Behavior normal.     Diabetic Foot Exam - Simple   No data filed      Lab Results  Component Value Date   WBC 5.2 08/25/2022   HGB 13.8 08/25/2022   HCT 42.3 08/25/2022   PLT 252 08/25/2022   GLUCOSE 103 (H) 08/25/2022   CHOL 156 08/25/2022   TRIG 134 08/25/2022   HDL 53 08/25/2022   LDLCALC 80 08/25/2022   ALT 22 08/25/2022   AST 25 08/25/2022   NA 139 08/25/2022   K 4.8 08/25/2022   CL 100 08/25/2022   CREATININE 1.10 (H) 08/25/2022   BUN 15 08/25/2022   CO2 23 08/25/2022   TSH 1.810 02/09/2021   INR 1.1 11/09/2021   HGBA1C 6.6 (H) 08/25/2022      Assessment & Plan:    Chest pain on breathing Assessment & Plan: Chest Tightness and Difficulty Breathing Persistent symptoms with recent worsening. Wheezing noted. No fever or productive cough. Albuterol use has been inconsistent and partially effective. No clear cardiac symptoms. -Continue Albuterol inhaler as prescribed (every 6 hours). -Notify office if symptoms do  not improve within a week. -Consider further cardiac evaluation if lung symptoms do not improve with consistent Albuterol use.   Chronic right flank pain Assessment & Plan: UA negative Continue to monitor symptoms Possibly tied to vertebral fractures Scheduled for MRI Will follow up with back and spine for possible surgery  Orders: -     POCT URINALYSIS DIP (CLINITEK)  Other closed fracture of twelfth thoracic vertebra, sequela Assessment & Plan: History of fractured back. Pain radiating to the rib. MRI scheduled to evaluate for nerve impingement. -Continue Tramadol as needed for pain. -Follow up with back specialist after MRI results.   Nasal congestion with rhinorrhea Assessment & Plan: Chronic symptoms, possibly related to deviated septum and allergies. Using nasal spray with partial relief. -Continue nasal spray as prescribed. -Try Benadryl twice daily for potential  allergy relief.   Need for vaccination -     Flu Vaccine Trivalent High Dose (Fluad)  Cystoid macular edema of both eyes Assessment & Plan: Chronic symptoms, possibly related to allergies. Using Pataday and gel drops. -Continue Pataday and gel drops as prescribed. -Try Benadryl twice daily for potential allergy relief.   Trochanteric bursitis of right hip Assessment & Plan: Pain radiating from hip to knee, possibly related to IT band tightness. -Use heat and massage with tennis ball for relief. -Continue Voltaren gel as needed.      No orders of the defined types were placed in this encounter.   Orders Placed This Encounter  Procedures   Flu Vaccine Trivalent High Dose (Fluad)   POCT URINALYSIS DIP (CLINITEK)     Follow-up: No follow-ups on file.  Assessment and Plan         I,Marla I Leal-Borjas,acting as a scribe for Langley Gauss, PA.,have documented all relevant documentation on the behalf of Langley Gauss, PA,as directed by  Langley Gauss, PA while in the presence of Langley Gauss, [place].    An After Visit Summary was printed and given to the patient.  Langley Gauss, [place] [facility] [phone]

## 2022-12-04 NOTE — Assessment & Plan Note (Signed)
Pain radiating from hip to knee, possibly related to IT band tightness. -Use heat and massage with tennis ball for relief. -Continue Voltaren gel as needed.

## 2022-12-04 NOTE — Assessment & Plan Note (Signed)
Chronic symptoms, possibly related to deviated septum and allergies. Using nasal spray with partial relief. -Continue nasal spray as prescribed. -Try Benadryl twice daily for potential allergy relief.

## 2022-12-04 NOTE — Assessment & Plan Note (Signed)
Chronic symptoms, possibly related to allergies. Using Pataday and gel drops. -Continue Pataday and gel drops as prescribed. -Try Benadryl twice daily for potential allergy relief.

## 2022-12-10 ENCOUNTER — Encounter: Payer: Self-pay | Admitting: Physician Assistant

## 2022-12-10 DIAGNOSIS — M546 Pain in thoracic spine: Secondary | ICD-10-CM | POA: Diagnosis not present

## 2022-12-19 NOTE — Progress Notes (Signed)
Subjective:  Patient ID: Diana Hendrix, female    DOB: 1945/03/20  Age: 77 y.o. MRN: [id number]  Chief Complaint  Patient presents with   Medical Management of Chronic Issues    HPI Diabetes:  Complications: hypertension, hyperlipidemia. Glucose checking: She does not check her blood sugar. Hypoglycemia: no Most recent A1C:6.3 Current medications: No medications. Last Eye Exam: UTD Foot checks: daily.  Hypertension: furosemide 20 mg once daily. Olmesartan 20 mg daily  Hyperlipidemia: Takes Rosuvastatin 40 mg daily, lovaza 2g BID  GERD: Currently taking Omeprazole 40 mg daily.  Asthma: Using monteluskast 10 mg 1 tablet at bedtime, I  OSA: Patient on CPAP. She is doing well. Patient feels rested. Compliant with nightly use.  Lunesta nor melatonin 10 mg does not help with sleeping. Amitriptyline d/c by Dr.Munley, Trazadone not recommended to take with Celexa.   Depression: Celexa 10 mg daily.   Eating healthy. Last AWV 01/23/2022     12/20/2022    8:36 AM 08/25/2022    8:39 AM 05/05/2022   10:03 AM 05/05/2022    9:33 AM 02/21/2022    3:44 PM  Depression screen PHQ 2/9  Decreased Interest 0 0 0 0 0  Down, Depressed, Hopeless 0 0 0 0 0  PHQ - 2 Score 0 0 0 0 0  Altered sleeping 0 3 0    Tired, decreased energy 0 0 0    Change in appetite 0 0 0    Feeling bad or failure about yourself  0 0 0    Trouble concentrating 0 0 0    Moving slowly or fidgety/restless 0 0 0    Suicidal thoughts 0 0 0    PHQ-9 Score 0 3 0    Difficult doing work/chores Not difficult at all Somewhat difficult Not difficult at all          08/25/2022    8:39 AM  Fall Risk   Falls in the past year? 1  Number falls in past yr: 0  Injury with Fall? 1  Risk for fall due to : No Fall Risks  Follow up Falls evaluation completed  Comment fell 10/2021    Patient Care Team: Blane Ohara, MD as PCP - General (Family Medicine) Sinda Du, MD as Consulting Physician (Ophthalmology) Coralyn Helling, MD (Inactive) as Consulting Physician (Pulmonary Disease) Baldo Daub, MD as Consulting Physician (Cardiology) Lynann Bologna, MD as Consulting Physician (Gastroenterology)   Review of Systems  Constitutional:  Negative for chills, fatigue and fever.  HENT:  Negative for congestion, ear pain, rhinorrhea and sore throat.   Respiratory:  Negative for cough and shortness of breath.   Cardiovascular:  Negative for chest pain.  Gastrointestinal:  Negative for abdominal pain, constipation, diarrhea, nausea and vomiting.  Genitourinary:  Negative for dysuria and urgency.  Musculoskeletal:  Positive for arthralgias. Negative for back pain and myalgias.  Neurological:  Negative for dizziness, weakness, light-headedness and headaches.  Psychiatric/Behavioral:  Negative for dysphoric mood. The patient is not nervous/anxious.     Current Outpatient Medications on File Prior to Visit  Medication Sig Dispense Refill   albuterol (VENTOLIN HFA) 108 (90 Base) MCG/ACT inhaler Inhale 2 puffs into the lungs every 6 (six) hours as needed for wheezing or shortness of breath. 8 g 1   calcitonin, salmon, (MIACALCIN/FORTICAL) 200 UNIT/ACT nasal spray 1 spray daily.     Cholecalciferol (VITAMIN D3) 50 MCG (2000 UT) TABS Take 1 tablet by mouth daily.     citalopram (CELEXA)  10 MG tablet Take 1 tablet (10 mg total) by mouth daily. 90 tablet 3   furosemide (LASIX) 20 MG tablet Take 1 tablet (20 mg total) by mouth daily. 90 tablet 3   metoprolol tartrate (LOPRESSOR) 25 MG tablet Take 25 mg by mouth daily.     montelukast (SINGULAIR) 10 MG tablet TAKE 1 TABLET(10 MG) BY MOUTH AT BEDTIME 90 tablet 1   NON FORMULARY CPAP at bedtime     olmesartan (BENICAR) 20 MG tablet Take 1 tablet (20 mg total) by mouth daily. 90 tablet 3   omega-3 acid ethyl esters (LOVAZA) 1 g capsule Take 2 capsules (2 g total) by mouth 2 (two) times daily. 360 capsule 2   omeprazole (PRILOSEC) 40 MG capsule TAKE 1 CAPSULE(40 MG) BY MOUTH  IN THE MORNING AND AT BEDTIME 180 capsule 0   Polyethyl Glycol-Propyl Glycol (SYSTANE OP) Place 1 drop into both eyes 4 (four) times daily.     polyethylene glycol (MIRALAX / GLYCOLAX) 17 g packet Take 17 g by mouth daily.     rosuvastatin (CRESTOR) 40 MG tablet TAKE 1 TABLET(40 MG) BY MOUTH DAILY 90 tablet 1   traMADol (ULTRAM) 50 MG tablet Take 1 tablet every day by oral route as needed for 15 days.     zolpidem (AMBIEN) 5 MG tablet TAKE 1 TABLET(5 MG) BY MOUTH AT BEDTIME AS NEEDED FOR SLEEP 30 tablet 5   No current facility-administered medications on file prior to visit.   Past Medical History:  Diagnosis Date   Cancer (HCC)    Diabetes mellitus without complication (HCC)    Diastolic heart failure (HCC)    Echo EF 60/65% Heart monitor was normal   GERD (gastroesophageal reflux disease)    History of colon polyps    Hyperlipidemia    Hypertension    Macular edema, cystoid    OSA (obstructive sleep apnea) 09/17/2012   Osteopenia    Plantar fasciitis 09/09/2019   Past Surgical History:  Procedure Laterality Date   CESAREAN SECTION     COLONOSCOPY  10/29/2013   Colonic polyp status post polypectomy. Mild sigmoid diverticulosis. Small internal hemorrhoids   FOOT NEUROMA SURGERY     s/p surgery in both feet   FOOT SURGERY Bilateral    Per patient   MENISCUS REPAIR Left 09/13/2016   miniscus repair right  Right 2019   SHOULDER SURGERY Left    arthroscopy.    TUBAL LIGATION      Family History  Problem Relation Age of Onset   Hyperlipidemia Mother    Heart disease Mother    Hypertension Mother    Atrial fibrillation Mother    CAD Mother    Congestive Heart Failure Mother    CVA Father    Hypertension Brother    Hypertension Son    Colon cancer Neg Hx    Esophageal cancer Neg Hx    Social History   Socioeconomic History   Marital status: Married    Spouse name: Engineer, production   Number of children: 2   Years of education: Not on file   Highest education level: Not  on file  Occupational History   Not on file  Tobacco Use   Smoking status: Never   Smokeless tobacco: Never  Vaping Use   Vaping status: Never Used  Substance and Sexual Activity   Alcohol use: No   Drug use: No   Sexual activity: Not Currently  Other Topics Concern   Not on file  Social  History Narrative   Lives with husband   Right handed   Caffeine: 1 cup of tea and 1 pepsi in the AM   Social Determinants of Health   Financial Resource Strain: Low Risk  (01/23/2022)   Overall Financial Resource Strain (CARDIA)    Difficulty of Paying Living Expenses: Not hard at all  Food Insecurity: No Food Insecurity (01/23/2022)   Hunger Vital Sign    Worried About Running Out of Food in the Last Year: Never true    Ran Out of Food in the Last Year: Never true  Transportation Needs: No Transportation Needs (01/23/2022)   PRAPARE - Administrator, Civil Service (Medical): No    Lack of Transportation (Non-Medical): No  Physical Activity: Sufficiently Active (08/25/2022)   Exercise Vital Sign    Days of Exercise per Week: 4 days    Minutes of Exercise per Session: 60 min  Stress: No Stress Concern Present (01/23/2022)   [employer] of Occupational Health - Occupational Stress Questionnaire    Feeling of Stress : Not at all  Social Connections: Moderately Integrated (01/23/2022)   Social Connection and Isolation Panel [NHANES]    Frequency of Communication with Friends and Family: More than three times a week    Frequency of Social Gatherings with Friends and Family: Once a week    Attends Religious Services: 1 to 4 times per year    Active Member of [employer] or Organizations: No    Attends Engineer, structural: Never    Marital Status: Married    Objective:  BP 118/72   Pulse 76   Temp 98.6 F (37 C)   Ht 5\' 2"  (1.575 m)   Wt 189 lb (85.7 kg)   SpO2 94%   BMI 34.57 kg/m      12/20/2022    8:33 AM 12/04/2022   11:20 AM 11/13/2022   10:24 AM  BP/Weight   Systolic BP 118 120   Diastolic BP 72 70   Wt. (Lbs) 189 192 190.3  BMI 34.57 kg/m2 35.12 kg/m2 34.81 kg/m2    Physical Exam Vitals reviewed.  Constitutional:      Appearance: Normal appearance.  HENT:     Nose: Congestion (pale pink.) present.  Cardiovascular:     Rate and Rhythm: Normal rate and regular rhythm.     Heart sounds: Normal heart sounds.  Pulmonary:     Effort: Pulmonary effort is normal.     Breath sounds: Normal breath sounds.  Abdominal:     Palpations: Abdomen is soft.     Tenderness: There is no abdominal tenderness.  Neurological:     Mental Status: She is alert and oriented to person, place, and time.  Psychiatric:        Mood and Affect: Mood normal.        Behavior: Behavior normal.     Diabetic Foot Exam - Simple   Simple Foot Form Diabetic Foot exam was performed with the following findings: Yes 12/20/2022  7:53 PM  Visual Inspection No deformities, no ulcerations, no other skin breakdown bilaterally: Yes Sensation Testing Intact to touch and monofilament testing bilaterally: Yes Pulse Check Posterior Tibialis and Dorsalis pulse intact bilaterally: Yes Comments      Lab Results  Component Value Date   WBC 7.8 12/20/2022   HGB 14.3 12/20/2022   HCT 43.5 12/20/2022   PLT 243 12/20/2022   GLUCOSE 105 (H) 12/20/2022   CHOL 141 12/20/2022   TRIG 160 (H) 12/20/2022  HDL 42 12/20/2022   LDLCALC 72 12/20/2022   ALT 18 12/20/2022   AST 30 12/20/2022   NA 141 12/20/2022   K 4.6 12/20/2022   CL 102 12/20/2022   CREATININE 1.03 (H) 12/20/2022   BUN 13 12/20/2022   CO2 22 12/20/2022   TSH 1.810 02/09/2021   INR 1.1 11/09/2021   HGBA1C 6.3 (H) 12/20/2022      Assessment & Plan:    Hypertensive heart disease with chronic diastolic congestive heart failure (HCC) Assessment & Plan: Well controlled.  No changes to medicines. Olmesartan 20 mg daily, Lasix 20 mg daily.  Continue to work on eating a healthy diet and exercise.  Labs  drawn today.    Orders: -     CBC with Differential/Platelet -     Comprehensive metabolic panel  Gastroesophageal reflux disease without esophagitis Assessment & Plan: Continue omeprazole.   Diabetes mellitus type 2 with complications [facility]) Assessment & Plan: Control: Well controlled Complications: hyperlipidemia, hypertension. Recommend check sugars fasting daily. Recommend check feet daily. Recommend annual eye exams. Medicines: None Continue to work on eating a healthy diet and exercise.  Labs drawn today.     Orders: -     Hemoglobin A1c  Mixed hyperlipidemia Assessment & Plan: Well controlled.  No changes to medicines. Rosuvastatin 40 mg daily.  Continue to work on eating a healthy diet and exercise.  Labs drawn today.    Orders: -     Lipid panel  Asthma, moderate persistent, poorly-controlled Assessment & Plan: Start azelastine nasal spray 2 puffs twice daily.  Continue benadryl and singulair.  Continue nasal rinse.  Stop albuterol inhaler. Only use four times a day as needed for wheezing.  May use mucinex otc as needed.   Check peak flow Order nebulizer with duoneb  Orders: -     Peak flow -     Ipratropium-Albuterol  Encounter for immunization Best boy Vaccine 45yrs & older  Other orders -     Azelastine HCl; Place 2 sprays into both nostrils 2 (two) times daily. Use in each nostril as directed  Dispense: 30 mL; Refill: 12     Meds ordered this encounter  Medications   ipratropium-albuterol (DUONEB) 0.5-2.5 (3) MG/3ML nebulizer solution 3 mL   azelastine (ASTELIN) 0.1 % nasal spray    Sig: Place 2 sprays into both nostrils 2 (two) times daily. Use in each nostril as directed    Dispense:  30 mL    Refill:  12    Orders Placed This Encounter  Procedures   Pfizer Comirnaty Covid-19 Vaccine 74yrs & older   CBC with Differential/Platelet   Comprehensive metabolic panel   Hemoglobin A1c   Lipid panel   Peak flow      Follow-up: Return in about 3 weeks (around 01/10/2023) for chronic follow up.   I,Marla I Leal-Borjas,acting as a scribe for Blane Ohara, MD.,have documented all relevant documentation on the behalf of Blane Ohara, MD,as directed by  Blane Ohara, MD while in the presence of Blane Ohara, MD.   An After Visit Summary was printed and given to the patient.  I attest that I have reviewed this visit and agree with the plan scribed by my staff.   Blane Ohara, MD [facility] [phone]

## 2022-12-20 ENCOUNTER — Ambulatory Visit (INDEPENDENT_AMBULATORY_CARE_PROVIDER_SITE_OTHER): Payer: Medicare Other | Admitting: Family Medicine

## 2022-12-20 ENCOUNTER — Encounter: Payer: Self-pay | Admitting: Family Medicine

## 2022-12-20 VITALS — BP 118/72 | HR 76 | Temp 98.6°F | Ht 62.0 in | Wt 189.0 lb

## 2022-12-20 DIAGNOSIS — I5032 Chronic diastolic (congestive) heart failure: Secondary | ICD-10-CM

## 2022-12-20 DIAGNOSIS — E1159 Type 2 diabetes mellitus with other circulatory complications: Secondary | ICD-10-CM | POA: Diagnosis not present

## 2022-12-20 DIAGNOSIS — K219 Gastro-esophageal reflux disease without esophagitis: Secondary | ICD-10-CM | POA: Diagnosis not present

## 2022-12-20 DIAGNOSIS — E118 Type 2 diabetes mellitus with unspecified complications: Secondary | ICD-10-CM

## 2022-12-20 DIAGNOSIS — J4540 Moderate persistent asthma, uncomplicated: Secondary | ICD-10-CM

## 2022-12-20 DIAGNOSIS — E1169 Type 2 diabetes mellitus with other specified complication: Secondary | ICD-10-CM | POA: Diagnosis not present

## 2022-12-20 DIAGNOSIS — E782 Mixed hyperlipidemia: Secondary | ICD-10-CM

## 2022-12-20 DIAGNOSIS — Z23 Encounter for immunization: Secondary | ICD-10-CM | POA: Diagnosis not present

## 2022-12-20 DIAGNOSIS — I110 Hypertensive heart disease with heart failure: Secondary | ICD-10-CM | POA: Diagnosis not present

## 2022-12-20 DIAGNOSIS — M4854XD Collapsed vertebra, not elsewhere classified, thoracic region, subsequent encounter for fracture with routine healing: Secondary | ICD-10-CM | POA: Diagnosis not present

## 2022-12-20 LAB — COMPREHENSIVE METABOLIC PANEL
ALT: 18 [IU]/L (ref 0–32)
AST: 30 [IU]/L (ref 0–40)
Albumin: 4.5 g/dL (ref 3.8–4.8)
Alkaline Phosphatase: 92 [IU]/L (ref 44–121)
BUN/Creatinine Ratio: 13 (ref 12–28)
BUN: 13 mg/dL (ref 8–27)
Bilirubin Total: 0.5 mg/dL (ref 0.0–1.2)
CO2: 22 mmol/L (ref 20–29)
Calcium: 9.3 mg/dL (ref 8.7–10.3)
Chloride: 102 mmol/L (ref 96–106)
Creatinine, Ser: 1.03 mg/dL — ABNORMAL HIGH (ref 0.57–1.00)
Globulin, Total: 2.6 g/dL (ref 1.5–4.5)
Glucose: 105 mg/dL — ABNORMAL HIGH (ref 70–99)
Potassium: 4.6 mmol/L (ref 3.5–5.2)
Sodium: 141 mmol/L (ref 134–144)
Total Protein: 7.1 g/dL (ref 6.0–8.5)
eGFR: 56 mL/min/{1.73_m2} — ABNORMAL LOW (ref >59)

## 2022-12-20 LAB — CBC WITH DIFFERENTIAL/PLATELET
Basophils Absolute: 0 10*3/uL (ref 0.0–0.2)
Basos: 1 %
EOS (ABSOLUTE): 0.4 10*3/uL (ref 0.0–0.4)
Eos: 5 %
Hematocrit: 43.5 % (ref 34.0–46.6)
Hemoglobin: 14.3 g/dL (ref 11.1–15.9)
Immature Grans (Abs): 0 10*3/uL (ref 0.0–0.1)
Immature Granulocytes: 0 %
Lymphocytes Absolute: 3.3 10*3/uL — ABNORMAL HIGH (ref 0.7–3.1)
Lymphs: 42 %
MCH: 30.2 pg (ref 26.6–33.0)
MCHC: 32.9 g/dL (ref 31.5–35.7)
MCV: 92 fL (ref 79–97)
Monocytes Absolute: 0.5 10*3/uL (ref 0.1–0.9)
Monocytes: 6 %
Neutrophils Absolute: 3.6 10*3/uL (ref 1.4–7.0)
Neutrophils: 46 %
Platelets: 243 10*3/uL (ref 150–450)
RBC: 4.73 x10E6/uL (ref 3.77–5.28)
RDW: 13.1 % (ref 11.7–15.4)
WBC: 7.8 10*3/uL (ref 3.4–10.8)

## 2022-12-20 LAB — LIPID PANEL
Chol/HDL Ratio: 3.4 {ratio} (ref 0.0–4.4)
Cholesterol, Total: 141 mg/dL (ref 100–199)
HDL: 42 mg/dL (ref >39)
LDL Chol Calc (NIH): 72 mg/dL (ref 0–99)
Triglycerides: 160 mg/dL — ABNORMAL HIGH (ref 0–149)
VLDL Cholesterol Cal: 27 mg/dL (ref 5–40)

## 2022-12-20 LAB — HEMOGLOBIN A1C
Est. average glucose Bld gHb Est-mCnc: 134 mg/dL
Hgb A1c MFr Bld: 6.3 % — ABNORMAL HIGH (ref 4.8–5.6)

## 2022-12-20 MED ORDER — AZELASTINE HCL 0.1 % NA SOLN: 2.0000 | Freq: Two times a day (BID) | NASAL | 12 refills | Status: DC

## 2022-12-20 MED ORDER — IPRATROPIUM-ALBUTEROL 0.5-2.5 (3) MG/3ML IN SOLN: 3.0000 mL | Freq: Once | RESPIRATORY_TRACT | Status: DC

## 2022-12-20 NOTE — Patient Instructions (Addendum)
Start azelastine nasal spray 2 puffs twice daily.  Continue benadryl and singulair.  Continue nasal rinse.  Stop albuterol inhaler. Only use four times a day as needed for wheezing.  May use mucinex otc as needed.

## 2022-12-23 DIAGNOSIS — J4540 Moderate persistent asthma, uncomplicated: Secondary | ICD-10-CM | POA: Insufficient documentation

## 2022-12-23 NOTE — Assessment & Plan Note (Signed)
Continue omeprazole 

## 2022-12-23 NOTE — Assessment & Plan Note (Addendum)
Control: Well controlled Complications: hyperlipidemia, hypertension. Recommend check sugars fasting daily. Recommend check feet daily. Recommend annual eye exams. Medicines: None Continue to work on eating a healthy diet and exercise.  Labs drawn today.

## 2022-12-23 NOTE — Assessment & Plan Note (Signed)
Start azelastine nasal spray 2 puffs twice daily.  Continue benadryl and singulair.  Continue nasal rinse.  Stop albuterol inhaler. Only use four times a day as needed for wheezing.  May use mucinex otc as needed.   Check peak flow Order nebulizer with duoneb

## 2022-12-23 NOTE — Assessment & Plan Note (Signed)
Well controlled.  No changes to medicines. Olmesartan 20 mg daily, Lasix 20 mg daily.  Continue to work on eating a healthy diet and exercise.  Labs drawn today.

## 2022-12-23 NOTE — Assessment & Plan Note (Signed)
Well controlled.  No changes to medicines. Rosuvastatin 40 mg daily. Continue to work on eating a healthy diet and exercise.  Labs drawn today.   

## 2022-12-25 ENCOUNTER — Encounter: Payer: Self-pay | Admitting: Family Medicine

## 2022-12-26 DIAGNOSIS — H04123 Dry eye syndrome of bilateral lacrimal glands: Secondary | ICD-10-CM | POA: Diagnosis not present

## 2022-12-26 DIAGNOSIS — R293 Abnormal posture: Secondary | ICD-10-CM | POA: Diagnosis not present

## 2022-12-26 DIAGNOSIS — M4854XD Collapsed vertebra, not elsewhere classified, thoracic region, subsequent encounter for fracture with routine healing: Secondary | ICD-10-CM | POA: Diagnosis not present

## 2022-12-26 MED ORDER — BUDESONIDE-FORMOTEROL FUMARATE 160-4.5 MCG/ACT IN AERO: 2.0000 | INHALATION_SPRAY | Freq: Two times a day (BID) | RESPIRATORY_TRACT | 3 refills | Status: DC

## 2022-12-28 DIAGNOSIS — M4854XD Collapsed vertebra, not elsewhere classified, thoracic region, subsequent encounter for fracture with routine healing: Secondary | ICD-10-CM | POA: Diagnosis not present

## 2022-12-28 DIAGNOSIS — R293 Abnormal posture: Secondary | ICD-10-CM | POA: Diagnosis not present

## 2023-01-02 DIAGNOSIS — M4854XD Collapsed vertebra, not elsewhere classified, thoracic region, subsequent encounter for fracture with routine healing: Secondary | ICD-10-CM | POA: Diagnosis not present

## 2023-01-02 DIAGNOSIS — R293 Abnormal posture: Secondary | ICD-10-CM | POA: Diagnosis not present

## 2023-01-04 DIAGNOSIS — M4854XD Collapsed vertebra, not elsewhere classified, thoracic region, subsequent encounter for fracture with routine healing: Secondary | ICD-10-CM | POA: Diagnosis not present

## 2023-01-04 DIAGNOSIS — R293 Abnormal posture: Secondary | ICD-10-CM | POA: Diagnosis not present

## 2023-01-08 ENCOUNTER — Encounter: Payer: Self-pay | Admitting: Family Medicine

## 2023-01-08 ENCOUNTER — Ambulatory Visit: Payer: Medicare Other | Admitting: Family Medicine

## 2023-01-08 VITALS — BP 128/72 | HR 61 | Temp 97.5°F | Ht 62.0 in | Wt 191.0 lb

## 2023-01-08 DIAGNOSIS — J4540 Moderate persistent asthma, uncomplicated: Secondary | ICD-10-CM | POA: Diagnosis not present

## 2023-01-08 MED ORDER — SPIRIVA RESPIMAT 1.25 MCG/ACT IN AERS: 2.0000 | INHALATION_SPRAY | Freq: Every day | RESPIRATORY_TRACT | Status: DC

## 2023-01-08 MED ORDER — FLUTICASONE PROPIONATE 50 MCG/ACT NA SUSP: 2.0000 | Freq: Every day | NASAL | 6 refills | Status: DC

## 2023-01-08 NOTE — Assessment & Plan Note (Signed)
Stop astepro.  Start flonase as directed.  Stop trelegy. Start Spiriva 2 puffs daily.  Continue nasal rinse.  May try mucinex.  Call Dr. Lenard Lance for follow up.

## 2023-01-08 NOTE — Patient Instructions (Addendum)
Stop astepro.  Start flonase as directed.  Stop trelegy. Start Spiriva 2 puffs daily.  Continue nasal rinse.  May try mucinex.  Call Dr. Lenard Lance for follow up.

## 2023-01-08 NOTE — Progress Notes (Signed)
Subjective:  Patient ID: Diana Hendrix, female    DOB: October 05, 1945  Age: 77 y.o. MRN: [id number]  Chief Complaint  Patient presents with   Medical Management of Chronic Issues    3 Week follow up    HPI Asthma: 3 week follow up. Here for recheck of peak flows. Started on trelegy one inhalation daily. Using astepro. Astepro tastes terrible. Requesting change to flonase. Repeat peak flows today did not show much improvement. She has not needed albuterol inhaler. Using nasal rinse. Patient has an appointment for OSA at the end of the January.      12/20/2022    8:36 AM 08/25/2022    8:39 AM 05/05/2022   10:03 AM 05/05/2022    9:33 AM 02/21/2022    3:44 PM  Depression screen PHQ 2/9  Decreased Interest 0 0 0 0 0  Down, Depressed, Hopeless 0 0 0 0 0  PHQ - 2 Score 0 0 0 0 0  Altered sleeping 0 3 0    Tired, decreased energy 0 0 0    Change in appetite 0 0 0    Feeling bad or failure about yourself  0 0 0    Trouble concentrating 0 0 0    Moving slowly or fidgety/restless 0 0 0    Suicidal thoughts 0 0 0    PHQ-9 Score 0 3 0    Difficult doing work/chores Not difficult at all Somewhat difficult Not difficult at all          08/25/2022    8:39 AM  Fall Risk   Falls in the past year? 1  Number falls in past yr: 0  Injury with Fall? 1  Risk for fall due to : No Fall Risks  Follow up Falls evaluation completed  Comment fell 10/2021    Patient Care Team: Blane Ohara, MD as PCP - General (Family Medicine) Sinda Du, MD as Consulting Physician (Ophthalmology) Coralyn Helling, MD (Inactive) as Consulting Physician (Pulmonary Disease) Baldo Daub, MD as Consulting Physician (Cardiology) Lynann Bologna, MD as Consulting Physician (Gastroenterology)   Review of Systems  Constitutional:  Negative for appetite change, fatigue and fever.  HENT:  Positive for congestion. Negative for ear pain, sinus pressure and sore throat.   Respiratory:  Positive for cough. Negative for chest  tightness, shortness of breath and wheezing.   Cardiovascular:  Negative for chest pain and palpitations.  Gastrointestinal:  Negative for abdominal pain, constipation, diarrhea, nausea and vomiting.  Genitourinary:  Negative for dysuria and hematuria.  Musculoskeletal:  Negative for arthralgias, back pain, joint swelling and myalgias.  Skin:  Negative for rash.  Neurological:  Negative for dizziness, weakness and headaches.  Psychiatric/Behavioral:  Negative for dysphoric mood. The patient is not nervous/anxious.     Current Outpatient Medications on File Prior to Visit  Medication Sig Dispense Refill   albuterol (VENTOLIN HFA) 108 (90 Base) MCG/ACT inhaler Inhale 2 puffs into the lungs every 6 (six) hours as needed for wheezing or shortness of breath. 8 g 1   azelastine (ASTELIN) 0.1 % nasal spray Place 2 sprays into both nostrils 2 (two) times daily. Use in each nostril as directed 30 mL 12   budesonide-formoterol (SYMBICORT) 160-4.5 MCG/ACT inhaler Inhale 2 puffs into the lungs 2 (two) times daily. 1 each 3   calcitonin, salmon, (MIACALCIN/FORTICAL) 200 UNIT/ACT nasal spray 1 spray daily.     Cholecalciferol (VITAMIN D3) 50 MCG (2000 UT) TABS Take 1 tablet by mouth daily.  citalopram (CELEXA) 10 MG tablet Take 1 tablet (10 mg total) by mouth daily. 90 tablet 3   furosemide (LASIX) 20 MG tablet Take 1 tablet (20 mg total) by mouth daily. 90 tablet 3   metoprolol tartrate (LOPRESSOR) 25 MG tablet Take 25 mg by mouth daily.     montelukast (SINGULAIR) 10 MG tablet TAKE 1 TABLET(10 MG) BY MOUTH AT BEDTIME 90 tablet 1   NON FORMULARY CPAP at bedtime     olmesartan (BENICAR) 20 MG tablet Take 1 tablet (20 mg total) by mouth daily. 90 tablet 3   omega-3 acid ethyl esters (LOVAZA) 1 g capsule Take 2 capsules (2 g total) by mouth 2 (two) times daily. 360 capsule 2   omeprazole (PRILOSEC) 40 MG capsule TAKE 1 CAPSULE(40 MG) BY MOUTH IN THE MORNING AND AT BEDTIME 180 capsule 0   Polyethyl  Glycol-Propyl Glycol (SYSTANE OP) Place 1 drop into both eyes 4 (four) times daily.     polyethylene glycol (MIRALAX / GLYCOLAX) 17 g packet Take 17 g by mouth daily.     rosuvastatin (CRESTOR) 40 MG tablet TAKE 1 TABLET(40 MG) BY MOUTH DAILY 90 tablet 1   traMADol (ULTRAM) 50 MG tablet Take 1 tablet every day by oral route as needed for 15 days.     zolpidem (AMBIEN) 5 MG tablet TAKE 1 TABLET(5 MG) BY MOUTH AT BEDTIME AS NEEDED FOR SLEEP 30 tablet 5   No current facility-administered medications on file prior to visit.   Past Medical History:  Diagnosis Date   Cancer (HCC)    Diabetes mellitus without complication (HCC)    Diastolic heart failure (HCC)    Echo EF 60/65% Heart monitor was normal   GERD (gastroesophageal reflux disease)    History of colon polyps    Hyperlipidemia    Hypertension    Macular edema, cystoid    OSA (obstructive sleep apnea) 09/17/2012   Osteopenia    Plantar fasciitis 09/09/2019   Past Surgical History:  Procedure Laterality Date   CESAREAN SECTION     COLONOSCOPY  10/29/2013   Colonic polyp status post polypectomy. Mild sigmoid diverticulosis. Small internal hemorrhoids   FOOT NEUROMA SURGERY     s/p surgery in both feet   FOOT SURGERY Bilateral    Per patient   MENISCUS REPAIR Left 09/13/2016   miniscus repair right  Right 2019   SHOULDER SURGERY Left    arthroscopy.    TUBAL LIGATION      Family History  Problem Relation Age of Onset   Hyperlipidemia Mother    Heart disease Mother    Hypertension Mother    Atrial fibrillation Mother    CAD Mother    Congestive Heart Failure Mother    CVA Father    Hypertension Brother    Hypertension Son    Colon cancer Neg Hx    Esophageal cancer Neg Hx    Social History   Socioeconomic History   Marital status: Married    Spouse name: Engineer, production   Number of children: 2   Years of education: Not on file   Highest education level: Not on file  Occupational History   Not on file  Tobacco  Use   Smoking status: Never   Smokeless tobacco: Never  Vaping Use   Vaping status: Never Used  Substance and Sexual Activity   Alcohol use: No   Drug use: No   Sexual activity: Not Currently  Other Topics Concern   Not on file  Social History Narrative   Lives with husband   Right handed   Caffeine: 1 cup of tea and 1 pepsi in the AM   Social Drivers of Health   Financial Resource Strain: Low Risk  (01/23/2022)   Overall Financial Resource Strain (CARDIA)    Difficulty of Paying Living Expenses: Not hard at all  Food Insecurity: No Food Insecurity (01/23/2022)   Hunger Vital Sign    Worried About Running Out of Food in the Last Year: Never true    Ran Out of Food in the Last Year: Never true  Transportation Needs: No Transportation Needs (01/23/2022)   PRAPARE - Administrator, Civil Service (Medical): No    Lack of Transportation (Non-Medical): No  Physical Activity: Sufficiently Active (08/25/2022)   Exercise Vital Sign    Days of Exercise per Week: 4 days    Minutes of Exercise per Session: 60 min  Stress: No Stress Concern Present (01/23/2022)   [employer] of Occupational Health - Occupational Stress Questionnaire    Feeling of Stress : Not at all  Social Connections: Moderately Integrated (01/23/2022)   Social Connection and Isolation Panel [NHANES]    Frequency of Communication with Friends and Family: More than three times a week    Frequency of Social Gatherings with Friends and Family: Once a week    Attends Religious Services: 1 to 4 times per year    Active Member of [employer] or Organizations: No    Attends Engineer, structural: Never    Marital Status: Married    Objective:  BP 128/72 (BP Location: Left Arm, Patient Position: Sitting)   Pulse 61   Temp (!) 97.5 F (36.4 C) (Temporal)   Ht 5\' 2"  (1.575 m)   Wt 191 lb (86.6 kg)   SpO2 98%   BMI 34.93 kg/m      01/08/2023    1:29 PM 01/08/2023    1:18 PM 12/20/2022    8:33 AM   BP/Weight  Systolic BP 128 148 118  Diastolic BP 72 72 72  Wt. (Lbs)  191 189  BMI  34.93 kg/m2 34.57 kg/m2    Physical Exam Vitals reviewed.  Constitutional:      Appearance: Normal appearance. She is normal weight.  Cardiovascular:     Rate and Rhythm: Normal rate and regular rhythm.     Heart sounds: Normal heart sounds.  Pulmonary:     Effort: Pulmonary effort is normal. No respiratory distress.     Breath sounds: Normal breath sounds.  Neurological:     Mental Status: She is alert and oriented to person, place, and time.  Psychiatric:        Mood and Affect: Mood normal.        Behavior: Behavior normal.     Diabetic Foot Exam - Simple   No data filed      Lab Results  Component Value Date   WBC 7.8 12/20/2022   HGB 14.3 12/20/2022   HCT 43.5 12/20/2022   PLT 243 12/20/2022   GLUCOSE 105 (H) 12/20/2022   CHOL 141 12/20/2022   TRIG 160 (H) 12/20/2022   HDL 42 12/20/2022   LDLCALC 72 12/20/2022   ALT 18 12/20/2022   AST 30 12/20/2022   NA 141 12/20/2022   K 4.6 12/20/2022   CL 102 12/20/2022   CREATININE 1.03 (H) 12/20/2022   BUN 13 12/20/2022   CO2 22 12/20/2022   TSH 1.810 02/09/2021   INR 1.1 11/09/2021  HGBA1C 6.3 (H) 12/20/2022      Assessment & Plan:    Asthma, moderate persistent, poorly-controlled Assessment & Plan: Stop astepro.  Start flonase as directed.  Stop trelegy. Start Spiriva 2 puffs daily.  Continue nasal rinse.  May try mucinex.  Call Dr. Lenard Lance for follow up.   Orders: -     Peak flow -     Fluticasone Propionate; Place 2 sprays into both nostrils daily.  Dispense: 16 g; Refill: 6 -     Spiriva Respimat; Inhale 2 puffs into the lungs daily.     Meds ordered this encounter  Medications   fluticasone (FLONASE) 50 MCG/ACT nasal spray    Sig: Place 2 sprays into both nostrils daily.    Dispense:  16 g    Refill:  6   Tiotropium Bromide Monohydrate (SPIRIVA RESPIMAT) 1.25 MCG/ACT AERS    Sig: Inhale 2 puffs into  the lungs daily.    Orders Placed This Encounter  Procedures   Peak flow     Follow-up: No follow-ups on file.   I,Lauren M Auman,acting as a scribe for Blane Ohara, MD.,have documented all relevant documentation on the behalf of Blane Ohara, MD,as directed by  Blane Ohara, MD while in the presence of Blane Ohara, MD.   An After Visit Summary was printed and given to the patient.  Blane Ohara, MD [facility] [phone]

## 2023-01-15 ENCOUNTER — Encounter: Payer: Self-pay | Admitting: Pulmonary Disease

## 2023-01-15 ENCOUNTER — Ambulatory Visit: Payer: Medicare Other | Admitting: Pulmonary Disease

## 2023-01-15 DIAGNOSIS — J4540 Moderate persistent asthma, uncomplicated: Secondary | ICD-10-CM

## 2023-01-15 DIAGNOSIS — R053 Chronic cough: Secondary | ICD-10-CM | POA: Diagnosis not present

## 2023-01-15 DIAGNOSIS — G4733 Obstructive sleep apnea (adult) (pediatric): Secondary | ICD-10-CM

## 2023-01-15 MED ORDER — SPIRIVA RESPIMAT 1.25 MCG/ACT IN AERS: 2.0000 | INHALATION_SPRAY | Freq: Every day | RESPIRATORY_TRACT | 3 refills | Status: DC

## 2023-01-15 NOTE — Progress Notes (Signed)
Diana Hendrix    [id number]    1945-11-15  Primary Care Physician:Cox, Fritzi Mandes, MD  Referring Physician: Blane Ohara, MD [address] [place],  [place] [address]  Chief complaint:   Patient known to Dr. Craige Cotta who I am seeing for follow-up History of asthma, obstructive sleep apnea completing course of antibiotics for pneumonia   HPI:  Patient stated she has had pneumonia at least twice since her last visit here  Recently had a CT scan of the chest in September -I could not find this on the system -This showed that the pneumonia had cleared up but did show some vertebral injuries  Following that she also had another bout of congestion for which she used a course of antibiotics   She continues to have a lot of congestion She has used different inhalers in the past and most of them made her feel worse than better  She was given a sample of Spiriva recently which she seems to be tolerating okay  Continue to feel congested Continues to use pain medications for back injuries  Recently had an MRI as well-this was done at [facility]  She has used multiple courses of antibiotics including azithromycin,, amoxicillin, Levaquin recently  She continues to complain that she is not feeling any better and feels other things are going on at present  She is on CPAP, pressure of 6, tolerating CPAP well  Has not been wheezing in the last few weeks  she continues to try to stay active  Not having significant problems with using CPAP  Outpatient Encounter Medications as of 01/15/2023  Medication Sig   albuterol (VENTOLIN HFA) 108 (90 Base) MCG/ACT inhaler Inhale 2 puffs into the lungs every 6 (six) hours as needed for wheezing or shortness of breath.   Cholecalciferol (VITAMIN D3) 50 MCG (2000 UT) TABS Take 1 tablet by mouth daily.   citalopram (CELEXA) 10 MG tablet Take 1 tablet (10 mg total) by mouth daily.   fluticasone (FLONASE) 50 MCG/ACT nasal spray Place  2 sprays into both nostrils daily.   furosemide (LASIX) 20 MG tablet Take 1 tablet (20 mg total) by mouth daily.   metoprolol tartrate (LOPRESSOR) 25 MG tablet Take 25 mg by mouth daily.   montelukast (SINGULAIR) 10 MG tablet TAKE 1 TABLET(10 MG) BY MOUTH AT BEDTIME   NON FORMULARY CPAP at bedtime   olmesartan (BENICAR) 20 MG tablet Take 1 tablet (20 mg total) by mouth daily.   omega-3 acid ethyl esters (LOVAZA) 1 g capsule Take 2 capsules (2 g total) by mouth 2 (two) times daily.   omeprazole (PRILOSEC) 40 MG capsule TAKE 1 CAPSULE(40 MG) BY MOUTH IN THE MORNING AND AT BEDTIME   Polyethyl Glycol-Propyl Glycol (SYSTANE OP) Place 1 drop into both eyes 4 (four) times daily.   polyethylene glycol (MIRALAX / GLYCOLAX) 17 g packet Take 17 g by mouth daily.   rosuvastatin (CRESTOR) 40 MG tablet TAKE 1 TABLET(40 MG) BY MOUTH DAILY   Tiotropium Bromide Monohydrate (SPIRIVA RESPIMAT) 1.25 MCG/ACT AERS Inhale 2 puffs into the lungs daily.   traMADol (ULTRAM) 50 MG tablet Take 1 tablet every day by oral route as needed for 15 days.   zolpidem (AMBIEN) 5 MG tablet TAKE 1 TABLET(5 MG) BY MOUTH AT BEDTIME AS NEEDED FOR SLEEP   [DISCONTINUED] azelastine (ASTELIN) 0.1 % nasal spray Place 2 sprays into both nostrils 2 (two) times daily. Use in each nostril as directed   [  DISCONTINUED] budesonide-formoterol (SYMBICORT) 160-4.5 MCG/ACT inhaler Inhale 2 puffs into the lungs 2 (two) times daily.   [DISCONTINUED] calcitonin, salmon, (MIACALCIN/FORTICAL) 200 UNIT/ACT nasal spray 1 spray daily.   No facility-administered encounter medications on file as of 01/15/2023.    Allergies as of 01/15/2023 - Review Complete 01/15/2023  Allergen Reaction Noted   Cortisone Other (See Comments) 04/26/2017   Glucosamine Other (See Comments) 04/16/2017   Zyrtec [cetirizine]  01/04/2021   Ibuprofen Other (See Comments) 08/14/2019    Past Medical History:  Diagnosis Date   Cancer (HCC)    Diabetes mellitus without  complication (HCC)    Diastolic heart failure (HCC)    Echo EF 60/65% Heart monitor was normal   GERD (gastroesophageal reflux disease)    History of colon polyps    Hyperlipidemia    Hypertension    Macular edema, cystoid    OSA (obstructive sleep apnea) 09/17/2012   Osteopenia    Plantar fasciitis 09/09/2019    Past Surgical History:  Procedure Laterality Date   CESAREAN SECTION     COLONOSCOPY  10/29/2013   Colonic polyp status post polypectomy. Mild sigmoid diverticulosis. Small internal hemorrhoids   FOOT NEUROMA SURGERY     s/p surgery in both feet   FOOT SURGERY Bilateral    Per patient   MENISCUS REPAIR Left 09/13/2016   miniscus repair right  Right 2019   SHOULDER SURGERY Left    arthroscopy.    TUBAL LIGATION      Family History  Problem Relation Age of Onset   Hyperlipidemia Mother    Heart disease Mother    Hypertension Mother    Atrial fibrillation Mother    CAD Mother    Congestive Heart Failure Mother    CVA Father    Hypertension Brother    Hypertension Son    Colon cancer Neg Hx    Esophageal cancer Neg Hx     Social History   Socioeconomic History   Marital status: Married    Spouse name: Engineer, production   Number of children: 2   Years of education: Not on file   Highest education level: Not on file  Occupational History   Not on file  Tobacco Use   Smoking status: Never   Smokeless tobacco: Never  Vaping Use   Vaping status: Never Used  Substance and Sexual Activity   Alcohol use: No   Drug use: No   Sexual activity: Not Currently  Other Topics Concern   Not on file  Social History Narrative   Lives with husband   Right handed   Caffeine: 1 cup of tea and 1 pepsi in the AM   Social Drivers of Health   Financial Resource Strain: Low Risk  (01/23/2022)   Overall Financial Resource Strain (CARDIA)    Difficulty of Paying Living Expenses: Not hard at all  Food Insecurity: No Food Insecurity (01/23/2022)   Hunger Vital Sign    Worried  About Running Out of Food in the Last Year: Never true    Ran Out of Food in the Last Year: Never true  Transportation Needs: No Transportation Needs (01/23/2022)   PRAPARE - Administrator, Civil Service (Medical): No    Lack of Transportation (Non-Medical): No  Physical Activity: Sufficiently Active (08/25/2022)   Exercise Vital Sign    Days of Exercise per Week: 4 days    Minutes of Exercise per Session: 60 min  Stress: No Stress Concern Present (01/23/2022)   [place]  [employer]stitute of Occupational Health - Occupational Stress Questionnaire    Feeling of Stress : Not at all  Social Connections: Moderately Integrated (01/23/2022)   Social Connection and Isolation Panel [NHANES]    Frequency of Communication with Friends and Family: More than three times a week    Frequency of Social Gatherings with Friends and Family: Once a week    Attends Religious Services: 1 to 4 times per year    Active Member of [employer] or Organizations: No    Attends Banker Meetings: Never    Marital Status: Married  Catering manager Violence: Not At Risk (11/10/2021)   Humiliation, Afraid, Rape, and Kick questionnaire    Fear of Current or Ex-Partner: No    Emotionally Abused: No    Physically Abused: No    Sexually Abused: No    Review of Systems  Respiratory:  Positive for apnea and cough. Negative for shortness of breath.   Psychiatric/Behavioral:  Positive for sleep disturbance.     There were no vitals filed for this visit.    Physical Exam Constitutional:      Appearance: She is obese.  HENT:     Head: Normocephalic.     Mouth/Throat:     Mouth: Mucous membranes are moist.  Eyes:     General: No scleral icterus.    Pupils: Pupils are equal, round, and reactive to light.  Cardiovascular:     Rate and Rhythm: Normal rate and regular rhythm.     Heart sounds: No murmur heard.    No friction rub.  Pulmonary:     Effort: No respiratory distress.     Breath sounds: No stridor.  No wheezing, rhonchi or rales.  Musculoskeletal:     Cervical back: No rigidity or tenderness.  Neurological:     Mental Status: She is alert.  Psychiatric:        Mood and Affect: Mood normal.    Data Reviewed: Most recent PFT 08/07/2019 shows normal pulmonary function test -Repeat PFT 04/25/2022-within normal limits with no obstruction, no restriction, normal diffusing capacity  Polysomnogram from 2014 reveals mild obstructive sleep apnea, was on auto CPAP Over time CPAP has been adjusted and currently just on a CPAP of 6  Compliance data shows excellent compliance with about 9 hours of use Residual AHI 1.6  PFT was fully reviewed with the patient during the visit, compliance data was reviewed with the patient  Assessment:  Obstructive sleep apnea -Well treated with CPAP therapy -Symptoms controlled with CPAP therapy  Chronic cough Chronic congestion -September CT scan did not show significant infiltrate  Has used multiple courses of antibiotics recently  Unclear what is causing her significant symptoms Continues to complain of a lot of pain and discomfort which may be chronic vertebral injuries for which she is on pain medications  History of compression fractures, osteopenia, osteoporosis  She does have a history of asthma but has not tolerated any inhalers outside Spiriva which she was just recently prescribed   Plan/Recommendations:  Continue CPAP use  Continue current inhalers  Prescription for Spiriva sent to pharmacy  I will have patient seen by one of the other physicians in the practice to see if anything else can be offered at present  Recently had a CT scan that did not show ongoing pneumonia according to her-this was performed at [facility]  Will make an appointment for her to be evaluated by another provider   Virl Diamond MD [facility] 01/15/2023,  1:02 PM  CC: Blane Ohara, MD

## 2023-01-15 NOTE — Patient Instructions (Signed)
Schedule for pulmonary function test  Prescription for Spiriva sent to pharmacy  Continue albuterol as needed  I am not putting in for any other inhaler since you did not feel the inhalers did help previously and actually caused you to feel worse  I will have one of my colleagues see you as well to see if there is any other concerns  Continue using your CPAP -Download from your CPAP appears to be doing well  The pain in the back may be from the spinal column rather than your lungs  Call us with significant concerns

## 2023-01-16 DIAGNOSIS — M4854XD Collapsed vertebra, not elsewhere classified, thoracic region, subsequent encounter for fracture with routine healing: Secondary | ICD-10-CM | POA: Diagnosis not present

## 2023-01-16 DIAGNOSIS — R293 Abnormal posture: Secondary | ICD-10-CM | POA: Diagnosis not present

## 2023-01-18 ENCOUNTER — Encounter: Payer: Self-pay | Admitting: Family Medicine

## 2023-01-18 DIAGNOSIS — M4854XD Collapsed vertebra, not elsewhere classified, thoracic region, subsequent encounter for fracture with routine healing: Secondary | ICD-10-CM | POA: Diagnosis not present

## 2023-01-18 DIAGNOSIS — R293 Abnormal posture: Secondary | ICD-10-CM | POA: Diagnosis not present

## 2023-01-19 ENCOUNTER — Telehealth: Payer: Self-pay | Admitting: Pulmonary Disease

## 2023-01-19 ENCOUNTER — Telehealth (INDEPENDENT_AMBULATORY_CARE_PROVIDER_SITE_OTHER): Payer: Self-pay | Admitting: Otolaryngology

## 2023-01-19 NOTE — Telephone Encounter (Signed)
 Spiriva  is no longer covered by her insurance. If possible she would like a substitute medication but wanted Dr.Olalere to know that the Spiriva  is no longer covered by her insurance. Patient can be reached at [phone].  Pharmacy: [facility] on [address] in [place]

## 2023-01-19 NOTE — Telephone Encounter (Signed)
 Called and confirmed appt date, time and location with patient.  Date: 01/22/2023 Status: Sch  Time: 10:30 AM [address] [place], [place] [address]

## 2023-01-22 ENCOUNTER — Encounter (INDEPENDENT_AMBULATORY_CARE_PROVIDER_SITE_OTHER): Payer: Self-pay

## 2023-01-22 ENCOUNTER — Other Ambulatory Visit: Payer: Self-pay | Admitting: Family Medicine

## 2023-01-22 ENCOUNTER — Ambulatory Visit (INDEPENDENT_AMBULATORY_CARE_PROVIDER_SITE_OTHER): Payer: Medicare Other

## 2023-01-22 VITALS — BP 129/72 | HR 63 | Resp 19 | Ht 62.0 in | Wt 190.0 lb

## 2023-01-22 DIAGNOSIS — J342 Deviated nasal septum: Secondary | ICD-10-CM | POA: Diagnosis not present

## 2023-01-22 DIAGNOSIS — J343 Hypertrophy of nasal turbinates: Secondary | ICD-10-CM | POA: Diagnosis not present

## 2023-01-22 DIAGNOSIS — J0100 Acute maxillary sinusitis, unspecified: Secondary | ICD-10-CM | POA: Diagnosis not present

## 2023-01-22 DIAGNOSIS — R0981 Nasal congestion: Secondary | ICD-10-CM

## 2023-01-22 DIAGNOSIS — R0982 Postnasal drip: Secondary | ICD-10-CM

## 2023-01-22 MED ORDER — AMOXICILLIN-POT CLAVULANATE 875-125 MG PO TABS: 1.0000 | ORAL_TABLET | Freq: Two times a day (BID) | ORAL | 0 refills | Status: AC

## 2023-01-22 MED ORDER — PREDNISONE 20 MG PO TABS: 20.0000 mg | ORAL_TABLET | Freq: Every day | ORAL | 0 refills | Status: AC

## 2023-01-22 MED ORDER — AYR SALINE NASAL NA GEL: 1.0000 | NASAL | 11 refills | Status: DC | PRN

## 2023-01-22 NOTE — Progress Notes (Signed)
 Dear Dr. Sherre, Here is my assessment for our mutual patient, Diana Hendrix. Thank you for allowing me the opportunity to care for your patient. Please do not hesitate to contact me should you have any other questions. Sincerely, Dr. Eldora Blanch  Otolaryngology Clinic Note Referring provider: Dr. Sherre HPI:  Diana Hendrix is a 78 y.o. female kindly referred by Dr. Sherre for evaluation of chronic sinusitis.  Initial visit (11/13/2022): She reports that she has had bilateral nasal drainage for past few months.  She complains of epiphora (clear, not discolored), and eye drainage but no pain. She also reports bilateral anterior rhinorrhea which is mucoid (not enough to collect, feels like she has to blow her nose, not triggered by valsalva). Most nasal complaints are during the morning. She denies post nasal drip. She reports that it has been ongoing on for about 6-7 months and eyes have been watering for about 3 months. Sometimes the eye itches.No facial pressure or pain. No discolored drainage from nose. Sense of smell intact. No headaches or vision changes. Right nasal obstruction but has been a problem for years. Does use CPAP - nasal. Has been problematic to use since her injury. No triggers by perfume or pressure changes.  No significant history of sinus infection. Does not know if she has had allergies. No cough.   She has used claritin , zyrtec, allegra, and tried benadryl. She has tried flonase  and astelin  (tried them several months) but without did not help. Has used nasal saline rinses PRN but does help.   She does see Pulm for PNA recently and got prednisone  and abx (this past summer). Now recovered  She has seen Dr. Mable for septal deviation/AR in the past and Dr. Maia in [facility] for epistaxis ----------------------------------- We decided to do medical management and now seen in f/u.  01/22/2023: She returns for follow up. Doing daily nasal rinse, and using flonase  prior and atrovent   (twice per day). She reports that atrovent  was drying her out some and so she stopped using it recently. She was doing very well on this regimen - her main complaint was nasal drainage in the morning and that has been way better with flonase  and atrovent . But, 2-3 weeks ago, she had a URI and now had some maxillary pressure and pain. She has been having some green drainage but that has stopped, now with just some mucoid rhinorrhea. Sense of smell is ok, nasal breathing without issue.  Eyes have quit watering after optho diagnosed with dry eye and refresh tears have helped significantly.  No abx or steroids.  Overall, she has been pleased with her progress  Her overall regimen is atrovent , flonase , benadryl, singulair , rinses  ------------------------------------------------- PMHx: Asthma, OSA, HTN, GERD, OSA on CPAP, Depression, DM  H&N Surgery: no Personal or FHx of bleeding dz or anesthesia difficulty: no  PMHx: Osteoporosis, HLD, HTN, Diabetes Not on AP/AC  Independent Review of Additional Tests or Records:  CT Face 10/2021: Right NB fx; left dev septum with right large spur; some blood (likely) left max but otheriwse no significant disease. No obvious large masses or erosion around NL duct on right    Pulm notes and PFTs: 04/2022: no obstruction; FeNo 32; mild OSA Dr. Sherre PCP notes (12/20/2022): Noted asthma, start astelin , continue benadryl and singulair  and duoneb; and then re-visit on 01/08/2023 - astelin  did not taste good so changed to flonase ; peak flows without much iprovement. Stopped trelegy, started spiriva  Dr. Neda (Pulm) 01/15/2023: lot of congesiton currently, not  feeling better from breathing standpoint and not wheezing. Unclear cause, continue spiriva , refer to another pulm. Ordered PFT CBC 12/20/2022: WBC 7.8, Eos 400; CMP: GFR 56, otherwise generally without significant pathology  PMH/Meds/All/SocHx/FamHx/ROS:   Past Medical History:  Diagnosis Date   Cancer (HCC)     Diabetes mellitus without complication (HCC)    Diastolic heart failure (HCC)    Echo EF 60/65% Heart monitor was normal   GERD (gastroesophageal reflux disease)    History of colon polyps    Hyperlipidemia    Hypertension    Macular edema, cystoid    OSA (obstructive sleep apnea) 09/17/2012   Osteopenia    Plantar fasciitis 09/09/2019    Past Surgical History:  Procedure Laterality Date   CESAREAN SECTION     COLONOSCOPY  10/29/2013   Colonic polyp status post polypectomy. Mild sigmoid diverticulosis. Small internal hemorrhoids   FOOT NEUROMA SURGERY     s/p surgery in both feet   FOOT SURGERY Bilateral    Per patient   MENISCUS REPAIR Left 09/13/2016   miniscus repair right  Right 2019   SHOULDER SURGERY Left    arthroscopy.    TUBAL LIGATION      Family History  Problem Relation Age of Onset   Hyperlipidemia Mother    Heart disease Mother    Hypertension Mother    Atrial fibrillation Mother    CAD Mother    Congestive Heart Failure Mother    CVA Father    Hypertension Brother    Hypertension Son    Colon cancer Neg Hx    Esophageal cancer Neg Hx      Social Connections: Moderately Integrated (01/23/2022)   Social Connection and Isolation Panel [NHANES]    Frequency of Communication with Friends and Family: More than three times a week    Frequency of Social Gatherings with Friends and Family: Once a week    Attends Religious Services: 1 to 4 times per year    Active Member of [employer] or Organizations: No    Attends Engineer, Structural: Never    Marital Status: Married      Current Outpatient Medications:    albuterol  (VENTOLIN  HFA) 108 (90 Base) MCG/ACT inhaler, Inhale 2 puffs into the lungs every 6 (six) hours as needed for wheezing or shortness of breath., Disp: 8 g, Rfl: 1   Cholecalciferol (VITAMIN D3) 50 MCG (2000 UT) TABS, Take 1 tablet by mouth daily., Disp: , Rfl:    citalopram  (CELEXA ) 10 MG tablet, Take 1 tablet (10 mg total) by mouth  daily., Disp: 90 tablet, Rfl: 3   fluticasone  (FLONASE ) 50 MCG/ACT nasal spray, Place 2 sprays into both nostrils daily., Disp: 16 g, Rfl: 6   furosemide  (LASIX ) 20 MG tablet, Take 1 tablet (20 mg total) by mouth daily., Disp: 90 tablet, Rfl: 3   metoprolol  tartrate (LOPRESSOR ) 25 MG tablet, Take 25 mg by mouth daily., Disp: , Rfl:    montelukast  (SINGULAIR ) 10 MG tablet, TAKE 1 TABLET(10 MG) BY MOUTH AT BEDTIME, Disp: 90 tablet, Rfl: 1   NON FORMULARY, CPAP at bedtime, Disp: , Rfl:    olmesartan  (BENICAR ) 20 MG tablet, Take 1 tablet (20 mg total) by mouth daily., Disp: 90 tablet, Rfl: 3   omega-3 acid ethyl esters (LOVAZA ) 1 g capsule, Take 2 capsules (2 g total) by mouth 2 (two) times daily., Disp: 360 capsule, Rfl: 2   omeprazole  (PRILOSEC) 40 MG capsule, TAKE 1 CAPSULE(40 MG) BY MOUTH IN THE MORNING  AND AT BEDTIME, Disp: 180 capsule, Rfl: 0   Polyethyl Glycol-Propyl Glycol (SYSTANE OP), Place 1 drop into both eyes 4 (four) times daily., Disp: , Rfl:    polyethylene glycol (MIRALAX  / GLYCOLAX ) 17 g packet, Take 17 g by mouth daily., Disp: , Rfl:    rosuvastatin  (CRESTOR ) 40 MG tablet, TAKE 1 TABLET(40 MG) BY MOUTH DAILY, Disp: 90 tablet, Rfl: 1   Tiotropium Bromide Monohydrate  (SPIRIVA  RESPIMAT) 1.25 MCG/ACT AERS, Inhale 2 puffs into the lungs daily., Disp: 4 g, Rfl: 3   traMADol  (ULTRAM ) 50 MG tablet, Take 1 tablet every day by oral route as needed for 15 days., Disp: , Rfl:    zolpidem  (AMBIEN ) 5 MG tablet, TAKE 1 TABLET(5 MG) BY MOUTH AT BEDTIME AS NEEDED FOR SLEEP, Disp: 30 tablet, Rfl: 5   Physical Exam:   BP 129/72 (BP Location: Right Arm, Patient Position: Sitting, Cuff Size: Normal)   Pulse 63   Resp 19   Ht 5' 2 (1.575 m)   Wt 190 lb (86.2 kg)   SpO2 95%   BMI 34.75 kg/m    Salient findings:  CN II-XII intact No epiphora on exam today; no chemosis or conjunctival injection, EOM intact  Bilateral EAC clear and TM intact with well pneumatized middle ear spaces Anterior  rhinoscopy: Septum with deviation to the right with a spur with more dorsal deviation left; bilateral inferior turbinates with modest hypertrophy. Nasal endoscopy was indicated to better evaluate the nose and paranasal sinuses, given the patient's history and exam findings and worsening nasal symptoms recently, and is detailed below. No lesions of oral cavity/oropharynx; dentition fair No obviously palpable neck masses/lymphadenopathy/thyromegaly No respiratory distress or stridor  Procedures:  PROCEDURE: Bilateral Diagnostic Rigid Nasal Endoscopy Pre-procedure diagnosis: Rhinorrhea, concern for acute sinusitis Post-procedure diagnosis: same Indication: See pre-procedure diagnosis and physical exam above Complications: None apparent EBL: 0 mL Anesthesia: Lidocaine  4% and topical decongestant was topically sprayed in each nasal cavity  Description of Procedure:  Patient was identified. A rigid 0 degree endoscope was utilized to evaluate the sinonasal cavities, mucosa, sinus ostia and turbinates and septum.  Overall, mild nasal dryness. Also noted is Septum with deviation to the right with a spur with more dorsal deviation left; bilateral inferior turbinates with modest hypertrophy.  No mucopurulence, polyps, or masses noted.  Left nasolacrimal area continues to be without obvious mucosal abnormalities  Right Middle meatus: clear Right SE Recess: clera Left MM: purulence Left SE Recess: clear    Photodocumentation was obtained.  CPT CODE -- 68768 - Mod 25  Impression & Plans:  Diana Hendrix is a 78 y.o. female with h/o PNA and Asthma now with  1. Acute non-recurrent maxillary sinusitis   2. Hypertrophy of both inferior nasal turbinates   3. Nasal congestion   4. Nasal septal deviation   5. Post-nasal drip    She was doing well on atrovent , flonase  and rinses until most recent episode of URI after which had discolored drainage, congestion, and now again some PND. Epiphora has  resolved on refresh tears, dx with dry eye. She does use CPAP and does not have vasomotor symptoms.  Given significant improvement from PND, rhinorrhea standpoint, we will continue this regimen. She said she almost cancelled this appointment due to how well she was doing However, she does have current purulence from her MM so will treat this accordingly.  Does not sound like LPR (no other sx like globus or throat clearing)  - Continue atrovent  0.06% BID PRN  2 sprays each nostril; use ayr gel prior to use - Continue flonase  BID - Continue Daily NeilMed Sinus rinses - Will Rx augmentin  and pred burst for her sinus infxn. - Not interested in septo/turbs, don't think will pursue given improvement  - discussed f/u, given her improvement, will do PRN; if sx persist, advised to call back and can re-eval  Meds ordered this encounter  Medications   amoxicillin -clavulanate (AUGMENTIN ) 875-125 MG tablet    Sig: Take 1 tablet by mouth 2 (two) times daily for 7 days.    Dispense:  14 tablet    Refill:  0   predniSONE  (DELTASONE ) 20 MG tablet    Sig: Take 1 tablet (20 mg total) by mouth daily with breakfast for 7 days.    Dispense:  7 tablet    Refill:  0   saline (AYR) GEL    Sig: Place 1 Application into both nostrils every 4 (four) hours as needed.    Dispense:  14 g    Refill:  11     Thank you for allowing me the opportunity to care for your patient. Please do not hesitate to contact me should you have any other questions.  Sincerely, Eldora Blanch, MD Otolarynoglogist (ENT), [facility] Phone: [phone] Fax: [phone]  01/22/2023, 11:33 AM   MDM:  Level 4: 99214 Complexity/Problems addressed: mod - chronic problem and new acute problem Data complexity: mod - independent interpretation of notes, labs - Morbidity: mod - Prescription Drug prescribed or managed: yes

## 2023-01-22 NOTE — Telephone Encounter (Signed)
 Patient called stating Spiriva  is no longer covered by insurance.  Message from pSpiriva is no longer covered by her insurance. If possible she would like a substitute medication but wanted Dr.Olalere to know that the Spiriva  is no longer covered by her insurance. Patient can be reached at [phone].   Pharmacy: [facility] [address] in [place] routed to [employer] to advise. Please send response to triage pool.

## 2023-01-22 NOTE — Patient Instructions (Addendum)
 Take Augmentin  875 mg by mouth (PO) twice daily for 7 days; take with food, take probiotic or yogurt with it Take Prednisone  by mouth (PO) 20mg  daily for 7 days in morning Continue rinses Continue flonase  You can restart the atrovent  (spray Dr. Tobie gave you) in a couple of weeks Use pea sized amount of ayr gel in each nostril if nose feels dry

## 2023-01-23 ENCOUNTER — Other Ambulatory Visit (HOSPITAL_COMMUNITY): Payer: Self-pay

## 2023-01-23 ENCOUNTER — Other Ambulatory Visit: Payer: Self-pay

## 2023-01-23 DIAGNOSIS — R293 Abnormal posture: Secondary | ICD-10-CM | POA: Diagnosis not present

## 2023-01-23 DIAGNOSIS — M4854XD Collapsed vertebra, not elsewhere classified, thoracic region, subsequent encounter for fracture with routine healing: Secondary | ICD-10-CM | POA: Diagnosis not present

## 2023-01-23 MED ORDER — METOPROLOL TARTRATE 25 MG PO TABS: 25.0000 mg | ORAL_TABLET | Freq: Every day | ORAL | 1 refills | Status: DC

## 2023-01-23 NOTE — Telephone Encounter (Signed)
 Ran test claims and received the following:   Spiriva  Respimat  $466.43 - being filled as transition fill Spiriva  Handihaler  (Generic) $237.79  (Brand) $294.34 Tudorza  $397.98 - being filled as a transition fill Incruse Ellipta   $313.17  Patient does have a deductible to meet so price will be high until met.

## 2023-01-25 DIAGNOSIS — M7061 Trochanteric bursitis, right hip: Secondary | ICD-10-CM | POA: Diagnosis not present

## 2023-01-25 DIAGNOSIS — M4854XA Collapsed vertebra, not elsewhere classified, thoracic region, initial encounter for fracture: Secondary | ICD-10-CM | POA: Diagnosis not present

## 2023-01-25 NOTE — Telephone Encounter (Signed)
 Patient states she can't afford Spiriva  @ $700 per month. She was made aware that she has a deductible to meet. The pharmacy team ran claim and all inhalers. FYI patient does have a nebulizer if that is alternative.    Spiriva  Respimat  $466.43 - being filled as transition fill Spiriva  Handihaler  (Generic) $237.79  (Brand) $294.34 Tudorza  $397.98 - being filled as a transition fill Incruse Ellipta   $313.17   Patient does have a deductible to meet so price will be high until met.

## 2023-01-26 DIAGNOSIS — M4854XD Collapsed vertebra, not elsewhere classified, thoracic region, subsequent encounter for fracture with routine healing: Secondary | ICD-10-CM | POA: Diagnosis not present

## 2023-01-26 DIAGNOSIS — R293 Abnormal posture: Secondary | ICD-10-CM | POA: Diagnosis not present

## 2023-01-29 ENCOUNTER — Ambulatory Visit: Payer: Medicare Other | Admitting: Pulmonary Disease

## 2023-01-29 DIAGNOSIS — J4540 Moderate persistent asthma, uncomplicated: Secondary | ICD-10-CM

## 2023-01-29 LAB — PULMONARY FUNCTION TEST
DL/VA % pred: 97 %
DL/VA: 4.07 ml/min/mmHg/L
DLCO cor % pred: 84 %
DLCO cor: 15.04 ml/min/mmHg
DLCO unc % pred: 86 %
DLCO unc: 15.45 ml/min/mmHg
FEF 25-75 Post: 2.79 L/s
FEF 25-75 Pre: 2.22 L/s
FEF2575-%Change-Post: 25 %
FEF2575-%Pred-Post: 186 %
FEF2575-%Pred-Pre: 148 %
FEV1-%Change-Post: 13 %
FEV1-%Pred-Post: 107 %
FEV1-%Pred-Pre: 94 %
FEV1-Post: 2.02 L
FEV1-Pre: 1.78 L
FEV1FVC-%Change-Post: 3 %
FEV1FVC-%Pred-Pre: 113 %
FEV6-%Change-Post: 10 %
FEV6-%Pred-Post: 96 %
FEV6-%Pred-Pre: 87 %
FEV6-Post: 2.32 L
FEV6-Pre: 2.1 L
FEV6FVC-%Pred-Post: 105 %
FEV6FVC-%Pred-Pre: 105 %
FVC-%Change-Post: 10 %
FVC-%Pred-Post: 91 %
FVC-%Pred-Pre: 82 %
FVC-Post: 2.32 L
FVC-Pre: 2.1 L
Post FEV1/FVC ratio: 87 %
Post FEV6/FVC ratio: 100 %
Pre FEV1/FVC ratio: 85 %
Pre FEV6/FVC Ratio: 100 %
RV % pred: 81 %
RV: 1.81 L
TLC % pred: 84 %
TLC: 4.04 L

## 2023-01-29 NOTE — Progress Notes (Signed)
 Full PFT performed today.

## 2023-01-29 NOTE — Patient Instructions (Signed)
 Full PFT performed today.

## 2023-01-30 DIAGNOSIS — R293 Abnormal posture: Secondary | ICD-10-CM | POA: Diagnosis not present

## 2023-01-30 DIAGNOSIS — M4854XD Collapsed vertebra, not elsewhere classified, thoracic region, subsequent encounter for fracture with routine healing: Secondary | ICD-10-CM | POA: Diagnosis not present

## 2023-02-02 DIAGNOSIS — M4854XD Collapsed vertebra, not elsewhere classified, thoracic region, subsequent encounter for fracture with routine healing: Secondary | ICD-10-CM | POA: Diagnosis not present

## 2023-02-02 DIAGNOSIS — R293 Abnormal posture: Secondary | ICD-10-CM | POA: Diagnosis not present

## 2023-02-05 ENCOUNTER — Other Ambulatory Visit: Payer: Self-pay | Admitting: Family Medicine

## 2023-02-05 ENCOUNTER — Other Ambulatory Visit (HOSPITAL_COMMUNITY): Payer: Self-pay

## 2023-02-05 DIAGNOSIS — J301 Allergic rhinitis due to pollen: Secondary | ICD-10-CM

## 2023-02-05 NOTE — Telephone Encounter (Signed)
is it possible to check whether Roxy Manns will be covered for her and how much will cost her- nebulized alternative to what she is on currently

## 2023-02-05 NOTE — Telephone Encounter (Signed)
Looks like the Delta will need to be processed through part B as her primary will not pay. And the Diana Hendrix is not on formulary and would need a PA.

## 2023-02-06 DIAGNOSIS — M4854XD Collapsed vertebra, not elsewhere classified, thoracic region, subsequent encounter for fracture with routine healing: Secondary | ICD-10-CM | POA: Diagnosis not present

## 2023-02-06 DIAGNOSIS — R293 Abnormal posture: Secondary | ICD-10-CM | POA: Diagnosis not present

## 2023-02-08 DIAGNOSIS — M4854XA Collapsed vertebra, not elsewhere classified, thoracic region, initial encounter for fracture: Secondary | ICD-10-CM | POA: Diagnosis not present

## 2023-02-09 DIAGNOSIS — R293 Abnormal posture: Secondary | ICD-10-CM | POA: Diagnosis not present

## 2023-02-09 DIAGNOSIS — M4854XD Collapsed vertebra, not elsewhere classified, thoracic region, subsequent encounter for fracture with routine healing: Secondary | ICD-10-CM | POA: Diagnosis not present

## 2023-02-12 ENCOUNTER — Ambulatory Visit: Payer: Medicare Other | Admitting: Pulmonary Disease

## 2023-02-13 DIAGNOSIS — R293 Abnormal posture: Secondary | ICD-10-CM | POA: Diagnosis not present

## 2023-02-13 DIAGNOSIS — M4854XD Collapsed vertebra, not elsewhere classified, thoracic region, subsequent encounter for fracture with routine healing: Secondary | ICD-10-CM | POA: Diagnosis not present

## 2023-02-14 ENCOUNTER — Encounter: Payer: Self-pay | Admitting: Family Medicine

## 2023-02-14 ENCOUNTER — Ambulatory Visit: Payer: Medicare Other | Admitting: Family Medicine

## 2023-02-14 VITALS — BP 118/68 | HR 65 | Temp 97.0°F | Ht 62.0 in | Wt 190.0 lb

## 2023-02-14 DIAGNOSIS — E782 Mixed hyperlipidemia: Secondary | ICD-10-CM

## 2023-02-14 DIAGNOSIS — S22060G Wedge compression fracture of T7-T8 vertebra, subsequent encounter for fracture with delayed healing: Secondary | ICD-10-CM | POA: Diagnosis not present

## 2023-02-14 DIAGNOSIS — E118 Type 2 diabetes mellitus with unspecified complications: Secondary | ICD-10-CM

## 2023-02-14 DIAGNOSIS — I10 Essential (primary) hypertension: Secondary | ICD-10-CM | POA: Diagnosis not present

## 2023-02-14 DIAGNOSIS — G4733 Obstructive sleep apnea (adult) (pediatric): Secondary | ICD-10-CM

## 2023-02-14 NOTE — Progress Notes (Signed)
Subjective:  Patient ID: Diana Hendrix, female    DOB: 24-Oct-1945  Age: 78 y.o. MRN: [id number]  Chief Complaint  Patient presents with   Back Pain    HPI   Patient is a 78 year old white female with past medical history significant for well-controlled diabetes, hypertension, hyperlipidemia, GERD, asthma, OSA who presents for surgical clearance due to persistent thoracic back pain.  Patient is seeing Dr. Shon Baton at [facility].  She had an MRI of her thoracic spine which showed compression fractures of T7, T8, and T12.  Patient has had persistent pain and a kyphoplasty has been recommended.  She is here for surgical clearance.  Recent labs on December 4 were well-controlled.  Her hemoglobin A1c was 6.3, cholesterol is at goal other than triglycerides were mildly elevated at 160.  Liver and kidney function were normal.  The patient has had issues with ongoing dyspnea on exertion.  She has had a very thorough cardiac, pulmonary and ENT workup.      12/20/2022    8:36 AM 08/25/2022    8:39 AM 05/05/2022   10:03 AM 05/05/2022    9:33 AM 02/21/2022    3:44 PM  Depression screen PHQ 2/9  Decreased Interest 0 0 0 0 0  Down, Depressed, Hopeless 0 0 0 0 0  PHQ - 2 Score 0 0 0 0 0  Altered sleeping 0 3 0    Tired, decreased energy 0 0 0    Change in appetite 0 0 0    Feeling bad or failure about yourself  0 0 0    Trouble concentrating 0 0 0    Moving slowly or fidgety/restless 0 0 0    Suicidal thoughts 0 0 0    PHQ-9 Score 0 3 0    Difficult doing work/chores Not difficult at all Somewhat difficult Not difficult at all          08/25/2022    8:39 AM  Fall Risk   Falls in the past year? 1  Number falls in past yr: 0  Injury with Fall? 1  Risk for fall due to : No Fall Risks  Follow up Falls evaluation completed  Comment fell 10/2021    Patient Care Team: Blane Ohara, MD as PCP - General (Family Medicine) Sinda Du, MD as Consulting Physician (Ophthalmology) Coralyn Helling,  MD (Inactive) as Consulting Physician (Pulmonary Disease) Baldo Daub, MD as Consulting Physician (Cardiology) Lynann Bologna, MD as Consulting Physician (Gastroenterology)   Review of Systems  Constitutional:  Negative for chills, fatigue and fever.  HENT:  Negative for congestion, ear pain, rhinorrhea and sore throat.   Respiratory:  Negative for cough and shortness of breath.   Cardiovascular:  Negative for chest pain.  Gastrointestinal:  Negative for abdominal pain, constipation, diarrhea, nausea and vomiting.  Genitourinary:  Negative for dysuria and urgency.  Musculoskeletal:  Positive for back pain. Negative for myalgias.  Neurological:  Negative for dizziness, weakness, light-headedness and headaches.  Psychiatric/Behavioral:  Negative for dysphoric mood. The patient is not nervous/anxious.     Current Outpatient Medications on File Prior to Visit  Medication Sig Dispense Refill   albuterol (VENTOLIN HFA) 108 (90 Base) MCG/ACT inhaler Inhale 2 puffs into the lungs every 6 (six) hours as needed for wheezing or shortness of breath. 8 g 1   Cholecalciferol (VITAMIN D3) 50 MCG (2000 UT) TABS Take 1 tablet by mouth daily.     citalopram (CELEXA) 10 MG tablet Take 1 tablet (10  mg total) by mouth daily. 90 tablet 3   fluticasone (FLONASE) 50 MCG/ACT nasal spray Place 2 sprays into both nostrils daily. 16 g 6   furosemide (LASIX) 20 MG tablet Take 1 tablet (20 mg total) by mouth daily. 90 tablet 3   metoprolol tartrate (LOPRESSOR) 25 MG tablet Take 1 tablet (25 mg total) by mouth daily. 90 tablet 1   montelukast (SINGULAIR) 10 MG tablet TAKE 1 TABLET(10 MG) BY MOUTH AT BEDTIME 90 tablet 1   NON FORMULARY CPAP at bedtime     olmesartan (BENICAR) 20 MG tablet Take 1 tablet (20 mg total) by mouth daily. 90 tablet 3   omega-3 acid ethyl esters (LOVAZA) 1 g capsule Take 2 capsules (2 g total) by mouth 2 (two) times daily. 360 capsule 2   omeprazole (PRILOSEC) 40 MG capsule TAKE 1 CAPSULE(40  MG) BY MOUTH IN THE MORNING AND AT BEDTIME 180 capsule 0   Polyethyl Glycol-Propyl Glycol (SYSTANE OP) Place 1 drop into both eyes 4 (four) times daily.     polyethylene glycol (MIRALAX / GLYCOLAX) 17 g packet Take 17 g by mouth daily.     rosuvastatin (CRESTOR) 40 MG tablet TAKE 1 TABLET(40 MG) BY MOUTH DAILY 90 tablet 1   saline (AYR) GEL Place 1 Application into both nostrils every 4 (four) hours as needed. 14 g 11   Tiotropium Bromide Monohydrate (SPIRIVA RESPIMAT) 1.25 MCG/ACT AERS Inhale 2 puffs into the lungs daily. 4 g 3   traMADol (ULTRAM) 50 MG tablet Take 1 tablet every day by oral route as needed for 15 days.     zolpidem (AMBIEN) 5 MG tablet TAKE 1 TABLET(5 MG) BY MOUTH AT BEDTIME AS NEEDED FOR SLEEP 30 tablet 5   No current facility-administered medications on file prior to visit.   Past Medical History:  Diagnosis Date   Cancer (HCC)    Diabetes mellitus without complication (HCC)    Diastolic heart failure (HCC)    Echo EF 60/65% Heart monitor was normal   GERD (gastroesophageal reflux disease)    History of colon polyps    Hyperlipidemia    Hypertension    Macular edema, cystoid    OSA (obstructive sleep apnea) 09/17/2012   Osteopenia    Plantar fasciitis 09/09/2019   Past Surgical History:  Procedure Laterality Date   CESAREAN SECTION     COLONOSCOPY  10/29/2013   Colonic polyp status post polypectomy. Mild sigmoid diverticulosis. Small internal hemorrhoids   FOOT NEUROMA SURGERY     s/p surgery in both feet   FOOT SURGERY Bilateral    Per patient   MENISCUS REPAIR Left 09/13/2016   miniscus repair right  Right 2019   SHOULDER SURGERY Left    arthroscopy.    TUBAL LIGATION      Family History  Problem Relation Age of Onset   Hyperlipidemia Mother    Heart disease Mother    Hypertension Mother    Atrial fibrillation Mother    CAD Mother    Congestive Heart Failure Mother    CVA Father    Hypertension Brother    Hypertension Son    Colon cancer Neg  Hx    Esophageal cancer Neg Hx    Social History   Socioeconomic History   Marital status: Married    Spouse name: Maevyn Riordan   Number of children: 2   Years of education: Not on file   Highest education level: Not on file  Occupational History   Not on  file  Tobacco Use   Smoking status: Never   Smokeless tobacco: Never  Vaping Use   Vaping status: Never Used  Substance and Sexual Activity   Alcohol use: No   Drug use: No   Sexual activity: Not Currently  Other Topics Concern   Not on file  Social History Narrative   Lives with husband   Right handed   Caffeine: 1 cup of tea and 1 pepsi in the AM   Social Drivers of Health   Financial Resource Strain: Low Risk  (01/23/2022)   Overall Financial Resource Strain (CARDIA)    Difficulty of Paying Living Expenses: Not hard at all  Food Insecurity: No Food Insecurity (01/23/2022)   Hunger Vital Sign    Worried About Running Out of Food in the Last Year: Never true    Ran Out of Food in the Last Year: Never true  Transportation Needs: No Transportation Needs (01/23/2022)   PRAPARE - Administrator, Civil Service (Medical): No    Lack of Transportation (Non-Medical): No  Physical Activity: Sufficiently Active (08/25/2022)   Exercise Vital Sign    Days of Exercise per Week: 4 days    Minutes of Exercise per Session: 60 min  Stress: No Stress Concern Present (01/23/2022)   [employer] of Occupational Health - Occupational Stress Questionnaire    Feeling of Stress : Not at all  Social Connections: Moderately Integrated (01/23/2022)   Social Connection and Isolation Panel [NHANES]    Frequency of Communication with Friends and Family: More than three times a week    Frequency of Social Gatherings with Friends and Family: Once a week    Attends Religious Services: 1 to 4 times per year    Active Member of [employer] or Organizations: No    Attends Engineer, structural: Never    Marital Status: Married     Objective:  BP 118/68   Pulse 65   Temp (!) 97 F (36.1 C)   Ht 5\' 2"  (1.575 m)   Wt 190 lb (86.2 kg)   SpO2 95%   BMI 34.75 kg/m      02/14/2023    3:13 PM 01/22/2023   10:50 AM 01/15/2023    1:02 PM  BP/Weight  Systolic BP 118 129 130  Diastolic BP 68 72 70  Wt. (Lbs) 190 190 190.2  BMI 34.75 kg/m2 34.75 kg/m2 34.79 kg/m2    Physical Exam Vitals reviewed.  Constitutional:      Appearance: Normal appearance. She is obese.  Neck:     Vascular: No carotid bruit.  Cardiovascular:     Rate and Rhythm: Normal rate and regular rhythm.     Heart sounds: Normal heart sounds.  Pulmonary:     Effort: Pulmonary effort is normal. No respiratory distress.     Breath sounds: Normal breath sounds.  Abdominal:     General: Abdomen is flat. Bowel sounds are normal.     Palpations: Abdomen is soft.     Tenderness: There is no abdominal tenderness.  Musculoskeletal:        General: Tenderness (thoracic) present.  Neurological:     Mental Status: She is alert and oriented to person, place, and time.  Psychiatric:        Mood and Affect: Mood normal.        Behavior: Behavior normal.     Diabetic Foot Exam - Simple   No data filed      Lab Results  Component  Value Date   WBC 7.8 12/20/2022   HGB 14.3 12/20/2022   HCT 43.5 12/20/2022   PLT 243 12/20/2022   GLUCOSE 105 (H) 12/20/2022   CHOL 141 12/20/2022   TRIG 160 (H) 12/20/2022   HDL 42 12/20/2022   LDLCALC 72 12/20/2022   ALT 18 12/20/2022   AST 30 12/20/2022   NA 141 12/20/2022   K 4.6 12/20/2022   CL 102 12/20/2022   CREATININE 1.03 (H) 12/20/2022   BUN 13 12/20/2022   CO2 22 12/20/2022   TSH 1.810 02/09/2021   INR 1.1 11/09/2021   HGBA1C 6.3 (H) 12/20/2022      Assessment & Plan:    Compression fracture of T8 vertebra with delayed healing, subsequent encounter Assessment & Plan: Cleared for surgery (kyphoplasty.)   Mixed hyperlipidemia Assessment & Plan: Well controlled. Continue  Rosuvastatin 40 mg daily.  Continue to work on eating a healthy diet and exercise.     OSA (obstructive sleep apnea) Assessment & Plan: Compliant with cpap.   Diabetes mellitus type 2 with complications [facility]) Assessment & Plan: Control: Well controlled. Cleared for surgery.   Essential hypertension, benign Assessment & Plan: Well controlled.  No changes to medicines.  Continue metoprolol and olmesartan.      No orders of the defined types were placed in this encounter.   No orders of the defined types were placed in this encounter.    Follow-up: Return if symptoms worsen or fail to improve.   I,Katherina A Bramblett,acting as a scribe for Blane Ohara, MD.,have documented all relevant documentation on the behalf of Blane Ohara, MD,as directed by  Blane Ohara, MD while in the presence of Blane Ohara, MD.   An After Visit Summary was printed and given to the patient.  I attest that I have reviewed this visit and agree with the plan scribed by my staff.   Blane Ohara, MD [facility] [phone]

## 2023-02-16 DIAGNOSIS — R293 Abnormal posture: Secondary | ICD-10-CM | POA: Diagnosis not present

## 2023-02-16 DIAGNOSIS — M4854XD Collapsed vertebra, not elsewhere classified, thoracic region, subsequent encounter for fracture with routine healing: Secondary | ICD-10-CM | POA: Diagnosis not present

## 2023-02-18 DIAGNOSIS — I10 Essential (primary) hypertension: Secondary | ICD-10-CM | POA: Insufficient documentation

## 2023-02-18 DIAGNOSIS — S22000G Wedge compression fracture of unspecified thoracic vertebra, subsequent encounter for fracture with delayed healing: Secondary | ICD-10-CM | POA: Insufficient documentation

## 2023-02-18 NOTE — Assessment & Plan Note (Signed)
Cleared for surgery (kyphoplasty.)

## 2023-02-18 NOTE — Assessment & Plan Note (Signed)
 Compliant with c-pap

## 2023-02-18 NOTE — Assessment & Plan Note (Signed)
Well controlled.  No changes to medicines.  Continue metoprolol and olmesartan.

## 2023-02-18 NOTE — Assessment & Plan Note (Signed)
Control: Well controlled. Cleared for surgery.

## 2023-02-18 NOTE — Assessment & Plan Note (Signed)
Well controlled. Continue Rosuvastatin 40 mg daily.  Continue to work on eating a healthy diet and exercise.

## 2023-02-21 DIAGNOSIS — S22060A Wedge compression fracture of T7-T8 vertebra, initial encounter for closed fracture: Secondary | ICD-10-CM | POA: Diagnosis not present

## 2023-02-21 DIAGNOSIS — M8008XA Age-related osteoporosis with current pathological fracture, vertebra(e), initial encounter for fracture: Secondary | ICD-10-CM | POA: Diagnosis not present

## 2023-02-28 ENCOUNTER — Other Ambulatory Visit: Payer: Self-pay | Admitting: Family Medicine

## 2023-02-28 DIAGNOSIS — E782 Mixed hyperlipidemia: Secondary | ICD-10-CM

## 2023-03-08 ENCOUNTER — Other Ambulatory Visit: Payer: Self-pay | Admitting: Family Medicine

## 2023-03-15 ENCOUNTER — Ambulatory Visit: Payer: Medicare Other | Admitting: Pulmonary Disease

## 2023-03-15 ENCOUNTER — Encounter: Payer: Self-pay | Admitting: Pulmonary Disease

## 2023-03-15 VITALS — BP 131/77 | HR 56 | Temp 98.0°F | Ht 62.0 in | Wt 191.0 lb

## 2023-03-15 DIAGNOSIS — R062 Wheezing: Secondary | ICD-10-CM

## 2023-03-15 DIAGNOSIS — R918 Other nonspecific abnormal finding of lung field: Secondary | ICD-10-CM | POA: Diagnosis not present

## 2023-03-15 DIAGNOSIS — J4540 Moderate persistent asthma, uncomplicated: Secondary | ICD-10-CM

## 2023-03-15 DIAGNOSIS — R0600 Dyspnea, unspecified: Secondary | ICD-10-CM | POA: Diagnosis not present

## 2023-03-15 DIAGNOSIS — G4733 Obstructive sleep apnea (adult) (pediatric): Secondary | ICD-10-CM

## 2023-03-15 NOTE — Patient Instructions (Signed)
 Based on review of your CT scan, discussion from other pulmonary doctors in the past, the intermittent nature of your symptoms, the description of what sounds like bronchospasm, chest tightness etc., I think asthma is the right diagnosis.  Most likely the inhaled steroids have caused the hoarseness in the past, we will try to avoid be moving forward  Try Stiolto 2 puffs once a day.  If this helps let me know and I can prescribe it.  Samples today.  Return to clinic in 3 months or sooner as needed with Dr. Judeth Horn

## 2023-03-15 NOTE — Progress Notes (Signed)
 @Patient  ID: Epimenio Sarin Barcia, female    DOB: 09-19-1945, 78 y.o.   MRN: [id number]  Chief Complaint  Patient presents with   Consult    Patient of Dr Wynona Neat.  C/o chest congestion, cough, wheeze.  Sx began after compression fx at T-7, T-8 and T-12 and pneumonia.    Referring provider: Blane Ohara, MD  HPI:   78 y.o. woman whom we are seeing for evaluation of congestion cough wheeze dyspnea.  Multiple prior pulmonary notes reviewed.  Prior patient of Dr. Aldean Ast, Dr. Craige Cotta.  Diagnosed with pneumonia 04/2022.  Based on chest x-ray.  I cannot review results.  Treated with antibiotics.  Since then has had worsening chest congestion, cough, chest tightness, dyspnea on exertion.  Waxes and wanes.  Comes and goes.  Severity is not always reliably reproducible.  Repeat chest x-ray 09/2022 reviewed, clear lungs.  CTA PE protocol 09/2022 for ongoing workup reviewed, no PE, diffuse mosaicism, no other infiltrate etc.  She had a lot of back pain and this did reveal back fractures.  This has been fixed, kyphoplasty in the interim.  Some residual pain but getting better.  Questionaires / Pulmonary Flowsheets:   ACT:  Asthma Control Test ACT Total Score  02/17/2022 11:01 AM 19    MMRC:     No data to display          Epworth:      No data to display          Tests:   FENO:  No results found for: "NITRICOXIDE"  PFT:    Latest Ref Rng & Units 01/29/2023    3:40 PM 04/25/2022   11:17 AM 08/07/2019    8:48 AM  PFT Results  FVC-Pre L 2.10  2.35  2.54   FVC-Predicted Pre % 82  91  93   FVC-Post L 2.32  2.24  2.60   FVC-Predicted Post % 91  87  96   Pre FEV1/FVC % % 85  82  84   Post FEV1/FCV % % 87  87  85   FEV1-Pre L 1.78  1.92  2.13   FEV1-Predicted Pre % 94  99  104   FEV1-Post L 2.02  1.95  2.20   DLCO uncorrected ml/min/mmHg 15.45  16.00  16.10   DLCO UNC% % 86  89  88   DLCO corrected ml/min/mmHg 15.04  16.00  16.10   DLCO COR %Predicted % 84  89  88   DLVA Predicted %  97  99  93   TLC L 4.04  4.40  4.34   TLC % Predicted % 84  92  89   RV % Predicted % 81  92  78   Personally reviewed and interpreted as 01/2023 significant bronchodilator response, otherwise normal spirometry, lung volumes DLCO within normal limits, largely unchanged over time with no findings of significant micro dilator response on review of prior PFTs  WALK:      No data to display          Imaging: Personally reviewed and as per EMR and discussion of this note No results found.  Lab Results: Personally reviewed CBC    Component Value Date/Time   WBC 7.8 12/20/2022 1021   WBC 11.0 (H) 11/11/2021 0101   RBC 4.73 12/20/2022 1021   RBC 3.92 11/11/2021 0101   HGB 14.3 12/20/2022 1021   HCT 43.5 12/20/2022 1021   PLT 243 12/20/2022 1021   MCV 92 12/20/2022 1021  MCH 30.2 12/20/2022 1021   MCH 30.1 11/11/2021 0101   MCHC 32.9 12/20/2022 1021   MCHC 34.1 11/11/2021 0101   RDW 13.1 12/20/2022 1021   LYMPHSABS 3.3 (H) 12/20/2022 1021   EOSABS 0.4 12/20/2022 1021   BASOSABS 0.0 12/20/2022 1021    BMET    Component Value Date/Time   NA 141 12/20/2022 1021   K 4.6 12/20/2022 1021   CL 102 12/20/2022 1021   CO2 22 12/20/2022 1021   GLUCOSE 105 (H) 12/20/2022 1021   GLUCOSE 121 (H) 11/13/2021 0043   BUN 13 12/20/2022 1021   CREATININE 1.03 (H) 12/20/2022 1021   CALCIUM 9.3 12/20/2022 1021   GFRNONAA 46 (L) 11/13/2021 0043   GFRAA 65 12/23/2019 1123    BNP    Component Value Date/Time   BNP 32.2 11/09/2021 1250    ProBNP    Component Value Date/Time   PROBNP 90 07/20/2020 1550   PROBNP 26.0 04/24/2019 0913    Specialty Problems       Pulmonary Problems   OSA (obstructive sleep apnea)          Allergic asthma   Mild intermittent asthma   Acute cough   Nasal congestion with rhinorrhea   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    Ibuprofen Other (See Comments)    Pt states Dr doesn't wont her to take it; not allergic    Immunization History  Administered Date(s) Administered   Fluad Quad(high Dose 65+) 10/16/2018, 11/10/2019, 09/27/2020, 10/24/2021   Fluad Trivalent(High Dose 65+) 12/04/2022   Influenza, High Dose Seasonal PF 10/09/2016, 10/04/2017   Influenza,inj,Quad PF,6+ Mos 10/25/2012, 09/17/2013, 09/21/2015   Influenza-Unspecified 12/12/2014   Moderna Covid-19 Vaccine Bivalent Booster 61yrs & up 01/04/2021   Moderna SARS-COV2 Booster Vaccination 06/21/2020   Moderna Sars-Covid-2 Vaccination 02/28/2019, 03/28/2019, 11/14/2019   Pfizer(Comirnaty)Fall Seasonal Vaccine 12 years and older 11/08/2021, 12/20/2022   Pneumococcal Conjugate-13 03/18/2015   Pneumococcal Polysaccharide-23 02/09/2011, 09/17/2013   Tdap 03/18/2015   Zoster Recombinant(Shingrix) 02/19/2021   Zoster, Live 09/17/2011    Past Medical History:  Diagnosis Date   Cancer (HCC)    Diabetes mellitus without complication (HCC)    Diastolic heart failure (HCC)    Echo EF 60/65% Heart monitor was normal   GERD (gastroesophageal reflux disease)    History of colon polyps    Hyperlipidemia    Hypertension    Macular edema, cystoid    OSA (obstructive sleep apnea) 09/17/2012   Osteopenia    Plantar fasciitis 09/09/2019    Tobacco History: Social History   Tobacco Use  Smoking Status Never  Smokeless Tobacco Never   Counseling given: Not Answered   Continue to not smoke  Outpatient Encounter Medications as of 03/15/2023  Medication Sig   albuterol (VENTOLIN HFA) 108 (90 Base) MCG/ACT inhaler Inhale 2 puffs into the lungs every 6 (six) hours as needed for wheezing or shortness of breath.   Cholecalciferol (VITAMIN D3) 50 MCG (2000 UT) TABS Take 1 tablet by mouth daily.   citalopram (CELEXA) 10 MG tablet Take  1 tablet (10 mg total) by mouth daily.   fluticasone (FLONASE) 50 MCG/ACT nasal spray Place 2 sprays into both nostrils daily.   furosemide (LASIX) 20 MG tablet  Take 1 tablet (20 mg total) by mouth daily.   metoprolol tartrate (LOPRESSOR) 25 MG tablet Take 1 tablet (25 mg total) by mouth daily.   montelukast (SINGULAIR) 10 MG tablet TAKE 1 TABLET(10 MG) BY MOUTH AT BEDTIME   NON FORMULARY CPAP at bedtime   olmesartan (BENICAR) 20 MG tablet Take 1 tablet (20 mg total) by mouth daily.   omega-3 acid ethyl esters (LOVAZA) 1 g capsule TAKE 2 CAPSULES BY MOUTH TWICE DAILY   omeprazole (PRILOSEC) 40 MG capsule TAKE 1 CAPSULE(40 MG) BY MOUTH IN THE MORNING AND AT BEDTIME   Polyethyl Glycol-Propyl Glycol (SYSTANE OP) Place 1 drop into both eyes 4 (four) times daily.   polyethylene glycol (MIRALAX / GLYCOLAX) 17 g packet Take 17 g by mouth daily.   rosuvastatin (CRESTOR) 40 MG tablet TAKE 1 TABLET(40 MG) BY MOUTH DAILY   zolpidem (AMBIEN) 5 MG tablet TAKE 1 TABLET(5 MG) BY MOUTH AT BEDTIME AS NEEDED FOR SLEEP   Tiotropium Bromide Monohydrate (SPIRIVA RESPIMAT) 1.25 MCG/ACT AERS Inhale 2 puffs into the lungs daily. (Patient not taking: Reported on 03/15/2023)   [DISCONTINUED] saline (AYR) GEL Place 1 Application into both nostrils every 4 (four) hours as needed. (Patient not taking: Reported on 03/15/2023)   [DISCONTINUED] traMADol (ULTRAM) 50 MG tablet Take 1 tablet every day by oral route as needed for 15 days. (Patient not taking: Reported on 03/15/2023)   No facility-administered encounter medications on file as of 03/15/2023.     Review of Systems  Review of Systems   Physical Exam  BP 131/77 (BP Location: Left Arm, Patient Position: Sitting, Cuff Size: Large)   Pulse (!) 56   Temp 98 F (36.7 C) (Oral)   Ht 5\' 2"  (1.575 m)   Wt 191 lb (86.6 kg)   SpO2 95%   BMI 34.93 kg/m   Wt Readings from Last 5 Encounters:  03/15/23 191 lb (86.6 kg)  02/14/23 190 lb (86.2 kg)  01/22/23 190 lb (86.2  kg)  01/15/23 190 lb 3.2 oz (86.3 kg)  01/08/23 191 lb (86.6 kg)    BMI Readings from Last 5 Encounters:  03/15/23 34.93 kg/m  02/14/23 34.75 kg/m  01/22/23 34.75 kg/m  01/15/23 34.79 kg/m  01/08/23 34.93 kg/m     Physical Exam General: Sitting in chair, in no acute distress eyes: EOMI, no icterus Neck: Supple, no JVD Pulmonary: Clear, normal work of breathing Cardiovascular: Warm, no edema Abdomen: Nondistended, MSK: No synovitis no major effusion Neuro: Normal gait, no weakness Psych: Normal mood, full affect   Assessment & Plan:   Chest congestion, wheeze, intermittent dyspnea, chest tightness: 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  Likely asthma.  Possibly triggered or worsened after pneumonia diagnosis 04/2022.  CT scan in the interim with mosaicism otherwise clear lungs.  Chest x-ray also clear.  Hoarseness of voice with Trelegy, Wixela, Symbicort.  No improvement in symptoms with Spiriva monotherapy.  Likely related to ICS.  Try to avoid ICS in the future.  Given improvement with albuterol, discussed role and rationale for bronchodilators.  Expressed understanding.  Trial of Stiolto 2 puffs once a day.  Samples today, can prescribe in the future if beneficial.  OSA: Encouraged to continue CPAP.   Return in about 3 months (around 06/12/2023) for f/u Dr. Judeth Horn.   Karren Burly, MD 03/15/2023   This appointment required 40 minutes of patient care (this includes precharting, chart review, review of results, face-to-face care, etc.).

## 2023-03-20 ENCOUNTER — Ambulatory Visit: Admitting: Physician Assistant

## 2023-03-20 VITALS — BP 120/64 | HR 74 | Temp 97.8°F | Ht 62.0 in | Wt 189.0 lb

## 2023-03-20 DIAGNOSIS — M810 Age-related osteoporosis without current pathological fracture: Secondary | ICD-10-CM

## 2023-03-20 DIAGNOSIS — J4540 Moderate persistent asthma, uncomplicated: Secondary | ICD-10-CM

## 2023-03-20 DIAGNOSIS — R051 Acute cough: Secondary | ICD-10-CM | POA: Diagnosis not present
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
## 2023-03-21 ENCOUNTER — Encounter: Payer: Self-pay | Admitting: Physician Assistant
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
## 2023-03-21 NOTE — Assessment & Plan Note (Signed)
 Kyphoplasty Recovery Post-kyphoplasty for T7 vertebra with significant improvement in back pain.

## 2023-03-21 NOTE — Assessment & Plan Note (Signed)
 Recent pneumonia with low suspicion for recurrence. Chest x-ray considered as precaution. - Order chest x-ray if wheezing persists by Friday or if symptoms worsen. - Monitor for fever or productive cough and seek medical attention if these occur.

## 2023-03-23 ENCOUNTER — Ambulatory Visit (HOSPITAL_BASED_OUTPATIENT_CLINIC_OR_DEPARTMENT_OTHER)
Admission: RE | Admit: 2023-03-23 | Discharge: 2023-03-23 | Disposition: A | Discharge status: Home / Self Care | Source: Ambulatory Visit | Attending: Physician Assistant | Admitting: Physician Assistant

## 2023-03-23 DIAGNOSIS — R051 Acute cough: Secondary | ICD-10-CM | POA: Diagnosis not present

## 2023-03-23 DIAGNOSIS — R0989 Other specified symptoms and signs involving the circulatory and respiratory systems: Secondary | ICD-10-CM | POA: Diagnosis not present

## 2023-04-01 ENCOUNTER — Encounter: Payer: Self-pay | Admitting: Physician Assistant

## 2023-04-02 ENCOUNTER — Other Ambulatory Visit: Payer: Self-pay

## 2023-04-02 DIAGNOSIS — M810 Age-related osteoporosis without current pathological fracture: Secondary | ICD-10-CM

## 2023-04-02 MED ORDER — DENOSUMAB 60 MG/ML ~~LOC~~ SOSY
60.0000 mg | PREFILLED_SYRINGE | SUBCUTANEOUS | Status: AC
  Administered 2023-05-10: 60 mg via SUBCUTANEOUS

## 2023-04-04 NOTE — Progress Notes (Signed)
 Subjective:  Patient ID: Diana Hendrix, female    DOB: 07-Nov-1945  Age: 78 y.o. MRN: [id number]  Chief Complaint  Patient presents with   Medical Management of Chronic Issues   Discussed the use of AI scribe software for clinical note transcription with the patient, who gave verbal consent to proceed.  History of Present Illness   Diana Hendrix "Diana Hendrix" is a 78 year old female with asthma who presents with ongoing respiratory symptoms and medication side effects.  She has been experiencing ongoing issues with asthma management, including difficulty breathing, particularly in the mornings upon waking. She cannot lay on either side as it worsens her breathing. She has a history of wheezing, which has improved over the past several weeks. She had a recent episode of congestion, which she attributes to allergies from being around a wood heating stove. No current wheezing or swelling is reported.  She has tried various medications, including Stiolto, which made her feel nervous, and Trelegy, which caused hoarseness. She stopped Trelegy about two weeks ago after using it for three days due to the hoarseness, which has since improved but not resolved. She recently tried one puff of Stiolto again and is monitoring its effects. She uses an albuterol inhaler sporadically, having used it yesterday morning but not regularly in recent weeks. Prior to her recent pulmonologist visit, she was using it daily, which she felt helped but also made her nervous and caused blurry vision.  She is currently on citalopram for depression, Prolia every six months for osteoporosis, Flonase nasal spray for allergies, Lasix 20 mg once a day for swelling, metoprolol 25 mg once a day, montelukast for asthma, olmesartan 20 mg a day for hypertension, rosuvastatin 40 mg a day for cholesterol.   She reports improved sleep over the last several weeks, although she still wakes up several times a night but can return to sleep  quickly. Occasional anxiety is experienced with certain medications.  An x-ray was performed, which came back normal. She recalls a previous breathing test where albuterol showed improvement, confirming asthma. Per the patient the pulmonologist reported the CT scan showed a cloudy area in her lung, indicating blocked air, which was interpreted as a sign of asthma.     Diabetes: Complications: hypertension, hyperlipidemia. Glucose checking: She does not check her blood sugar. Hypoglycemia: no Most recent A1C:6.3 Current medications: No medications. Last Eye Exam: UTD Foot checks: daily.  Hypertension: furosemide 20 mg once daily. Olmesartan 20 mg daily  Hyperlipidemia: Takes Rosuvastatin 40 mg daily, lovaza 2 gm oral twice daily.   GERD: Currently taking Omeprazole 40 mg daily.  Asthma: Using montelukast 10 mg 1 tablet at bedtime, Dr. Judeth Horn gave her stiolto, but caused her to be anxious so stopped it. Returned to trelegy for 3 days and caused hoarseness, so stopped it about 2 weeks ago and is still hoarse. Improving. Tried one inhalation of stiolto this morning, but not sure if it is helping. One puff did not seem to make her anxious. Returns to Dr. Judeth Horn at the end of the may. Uses albuterol very sporadically.   OSA: Patient on CPAP. She is doing well. Patient feels rested. Compliant with nightly use.   Insomnia: Lunesta nor melatonin 10 mg does not help with sleeping. Amitriptyline d/c by Dr.Munley, Trazadone not recommended to take with Celexa. Ambien 5 mg before bed helping.   Depression: Celexa 10 mg daily. Zolpidem 5 mg before bed helps her sleep. Still wakes up at night.  Eating healthy.      03/20/2023    4:15 PM 12/20/2022    8:36 AM 08/25/2022    8:39 AM 05/05/2022   10:03 AM 05/05/2022    9:33 AM  Depression screen PHQ 2/9  Decreased Interest 0 0 0 0 0  Down, Depressed, Hopeless 0 0 0 0 0  PHQ - 2 Score 0 0 0 0 0  Altered sleeping 0 0 3 0   Tired, decreased  energy 0 0 0 0   Change in appetite 0 0 0 0   Feeling bad or failure about yourself  0 0 0 0   Trouble concentrating 0 0 0 0   Moving slowly or fidgety/restless 0 0 0 0   Suicidal thoughts 0 0 0 0   PHQ-9 Score 0 0 3 0   Difficult doing work/chores Not difficult at all Not difficult at all Somewhat difficult Not difficult at all         04/05/2023    9:39 AM  Fall Risk   Falls in the past year? 0  Number falls in past yr: 0  Injury with Fall? 0  Risk for fall due to : No Fall Risks  Follow up Falls evaluation completed    Patient Care Team: Blane Ohara, MD as PCP - General (Family Medicine) Sinda Du, MD as Consulting Physician (Ophthalmology) Coralyn Helling, MD (Inactive) as Consulting Physician (Pulmonary Disease) Baldo Daub, MD as Consulting Physician (Cardiology) Lynann Bologna, MD as Consulting Physician (Gastroenterology)   Review of Systems  Constitutional:  Negative for appetite change, fatigue and fever.  HENT:  Negative for congestion, ear pain, sinus pressure and sore throat.   Respiratory:  Positive for wheezing (Asthma flare up). Negative for cough, chest tightness and shortness of breath.   Cardiovascular:  Negative for chest pain and palpitations.  Gastrointestinal:  Negative for abdominal pain, constipation, diarrhea, nausea and vomiting.  Genitourinary:  Negative for dysuria and hematuria.  Musculoskeletal:  Negative for arthralgias, back pain, joint swelling and myalgias.  Skin:  Negative for rash.  Neurological:  Negative for dizziness, weakness and headaches.  Psychiatric/Behavioral:  Negative for dysphoric mood. The patient is not nervous/anxious.     Current Outpatient Medications on File Prior to Visit  Medication Sig Dispense Refill   albuterol (VENTOLIN HFA) 108 (90 Base) MCG/ACT inhaler Inhale 2 puffs into the lungs every 6 (six) hours as needed for wheezing or shortness of breath. 8 g 1   Cholecalciferol (VITAMIN D3) 50 MCG (2000 UT) TABS  Take 1 tablet by mouth daily.     citalopram (CELEXA) 10 MG tablet Take 1 tablet (10 mg total) by mouth daily. 90 tablet 3   fluticasone (FLONASE) 50 MCG/ACT nasal spray Place 2 sprays into both nostrils daily. 16 g 6   furosemide (LASIX) 20 MG tablet Take 1 tablet (20 mg total) by mouth daily. 90 tablet 3   montelukast (SINGULAIR) 10 MG tablet TAKE 1 TABLET(10 MG) BY MOUTH AT BEDTIME 90 tablet 1   NON FORMULARY CPAP at bedtime     olmesartan (BENICAR) 20 MG tablet Take 1 tablet (20 mg total) by mouth daily. 90 tablet 3   omega-3 acid ethyl esters (LOVAZA) 1 g capsule TAKE 2 CAPSULES BY MOUTH TWICE DAILY 360 capsule 2   omeprazole (PRILOSEC) 40 MG capsule TAKE 1 CAPSULE(40 MG) BY MOUTH IN THE MORNING AND AT BEDTIME 180 capsule 0   Polyethyl Glycol-Propyl Glycol (SYSTANE OP) Place 1 drop into both eyes 4 (  four) times daily.     polyethylene glycol (MIRALAX / GLYCOLAX) 17 g packet Take 17 g by mouth daily.     rosuvastatin (CRESTOR) 40 MG tablet TAKE 1 TABLET(40 MG) BY MOUTH DAILY 90 tablet 1   Tiotropium Bromide-Olodaterol (STIOLTO RESPIMAT) 2.5-2.5 MCG/ACT AERS Inhale into the lungs.     Current Facility-Administered Medications on File Prior to Visit  Medication Dose Route Frequency Provider Last Rate Last Admin   [START ON 05/10/2023] denosumab (PROLIA) injection 60 mg  60 mg Subcutaneous Q6 months Chrishana Spargur, MD       Past Medical History:  Diagnosis Date   Cancer [facility])    Diabetes mellitus without complication (HCC)    Diastolic heart failure (HCC)    Echo EF 60/65% Heart monitor was normal   GERD (gastroesophageal reflux disease)    History of colon polyps    Hyperlipidemia    Hypertension    Macular edema, cystoid    OSA (obstructive sleep apnea) 09/17/2012   Osteopenia    Plantar fasciitis 09/09/2019   Past Surgical History:  Procedure Laterality Date   CESAREAN SECTION     COLONOSCOPY  10/29/2013   Colonic polyp status post polypectomy. Mild sigmoid diverticulosis. Small  internal hemorrhoids   FOOT NEUROMA SURGERY     s/p surgery in both feet   FOOT SURGERY Bilateral    Per patient   MENISCUS REPAIR Left 09/13/2016   miniscus repair right  Right 2019   SHOULDER SURGERY Left    arthroscopy.    TUBAL LIGATION      Family History  Problem Relation Age of Onset   Hyperlipidemia Mother    Heart disease Mother    Hypertension Mother    Atrial fibrillation Mother    CAD Mother    Congestive Heart Failure Mother    CVA Father    Hypertension Brother    Hypertension Son    Colon cancer Neg Hx    Esophageal cancer Neg Hx    Social History   Socioeconomic History   Marital status: Married    Spouse name: Engineer, production   Number of children: 2   Years of education: Not on file   Highest education level: Not on file  Occupational History   Not on file  Tobacco Use   Smoking status: Never   Smokeless tobacco: Never  Vaping Use   Vaping status: Never Used  Substance and Sexual Activity   Alcohol use: No   Drug use: No   Sexual activity: Not Currently  Other Topics Concern   Not on file  Social History Narrative   Lives with husband   Right handed   Caffeine: 1 cup of tea and 1 pepsi in the AM   Social Drivers of Health   Financial Resource Strain: Low Risk  (01/23/2022)   Overall Financial Resource Strain (CARDIA)    Difficulty of Paying Living Expenses: Not hard at all  Food Insecurity: No Food Insecurity (01/23/2022)   Hunger Vital Sign    Worried About Running Out of Food in the Last Year: Never true    Ran Out of Food in the Last Year: Never true  Transportation Needs: No Transportation Needs (01/23/2022)   PRAPARE - Administrator, Civil Service (Medical): No    Lack of Transportation (Non-Medical): No  Physical Activity: Sufficiently Active (08/25/2022)   Exercise Vital Sign    Days of Exercise per Week: 4 days    Minutes of Exercise per Session:  60 min  Stress: No Stress Concern Present (01/23/2022)   [employer] of  Occupational Health - Occupational Stress Questionnaire    Feeling of Stress : Not at all  Social Connections: Moderately Integrated (01/23/2022)   Social Connection and Isolation Panel [NHANES]    Frequency of Communication with Friends and Family: More than three times a week    Frequency of Social Gatherings with Friends and Family: Once a week    Attends Religious Services: 1 to 4 times per year    Active Member of [employer] or Organizations: No    Attends Engineer, structural: Never    Marital Status: Married    Objective:  BP 110/62 (BP Location: Left Arm, Patient Position: Sitting)   Pulse 69   Temp 97.9 F (36.6 C) (Temporal)   Ht 5\' 2"  (1.575 m)   Wt 189 lb (85.7 kg)   SpO2 96%   BMI 34.57 kg/m      04/05/2023    9:35 AM 03/20/2023    4:10 PM 03/15/2023    2:10 PM  BP/Weight  Systolic BP 110 120 131  Diastolic BP 62 64 77  Wt. (Lbs) 189 189 191  BMI 34.57 kg/m2 34.57 kg/m2 34.93 kg/m2    Physical Exam Vitals reviewed.  Constitutional:      Appearance: Normal appearance. She is normal weight.  Neck:     Vascular: No carotid bruit.  Cardiovascular:     Rate and Rhythm: Normal rate and regular rhythm.     Heart sounds: Normal heart sounds.  Pulmonary:     Effort: Pulmonary effort is normal. No respiratory distress.     Breath sounds: Normal breath sounds.  Abdominal:     General: Abdomen is flat. Bowel sounds are normal.     Palpations: Abdomen is soft.     Tenderness: There is no abdominal tenderness.  Neurological:     Mental Status: She is alert and oriented to person, place, and time.  Psychiatric:        Mood and Affect: Mood normal.        Behavior: Behavior normal.     Diabetic Foot Exam - Simple   No data filed      Lab Results  Component Value Date   WBC 6.8 04/05/2023   HGB 13.8 04/05/2023   HCT 42.7 04/05/2023   PLT 211 04/05/2023   GLUCOSE 97 04/05/2023   CHOL 143 04/05/2023   TRIG 141 04/05/2023   HDL 51 04/05/2023   LDLCALC  68 04/05/2023   ALT 20 04/05/2023   AST 27 04/05/2023   NA 137 04/05/2023   K 4.4 04/05/2023   CL 101 04/05/2023   CREATININE 1.14 (H) 04/05/2023   BUN 15 04/05/2023   CO2 22 04/05/2023   TSH 1.810 02/09/2021   INR 1.1 11/09/2021   HGBA1C 6.3 (H) 12/20/2022      Assessment & Plan:   Hypertensive heart disease with chronic diastolic congestive heart failure (HCC) Assessment & Plan: Long-standing hypertension managed with olmesartan and metoprolol. Metoprolol may exacerbate asthma, but necessary for heart rate control. Plan to give a trial of diltiazem for rate control and discontinue metoprolol assess impact on asthma. Discussed the potential for diltiazem to manage heart rate without exacerbating asthma symptoms. - Start diltiazem 120 mg daily - Monitor blood pressure and heart rate daily for 2-3 weeks - Discontinue metoprolol - Follow up in 2-3 weeks to assess response to diltiazem  Orders: -     CBC  with Differential/Platelet -     Comprehensive metabolic panel  OSA (obstructive sleep apnea) Assessment & Plan: Compliant with cpap.benefiting    Asthma, moderate persistent, poorly-controlled Assessment & Plan: Not at goal.  - Continue one inhalation of Stiolto daily if tolerated - Monitor for anxiety or other side effects - Use albuterol inhaler as needed - Follow up with pulmonologist at the end of May   Gastroesophageal reflux disease without esophagitis Assessment & Plan: Well controlled.  Continue omeprazole.   Mixed hyperlipidemia Assessment & Plan: Well controlled.  Continue Rosuvastatin 40 mg daily.  Continue lovaza 1 gm 2 capsules twice daily.  Continue to work on eating a healthy diet and exercise.    Orders: -     Lipid panel  Tachycardia -     dilTIAZem HCl ER Coated Beads; Take 1 capsule (120 mg total) by mouth daily.  Dispense: 30 capsule; Refill: 0  Other insomnia Assessment & Plan: Improved. Continue ambien 5 mg before bed.    Orders: -     Zolpidem Tartrate; Take 1 tablet (5 mg total) by mouth at bedtime as needed for sleep.  Dispense: 30 tablet; Refill: 5  Encounter for osteoporosis screening in asymptomatic postmenopausal patient -     DG Bone Density; Future  Visit for screening mammogram -     Digital Screening Mammogram, Left and Right; Future  Mild recurrent major depression (HCC) Assessment & Plan: Depression managed with citalopram.   Osteoporosis of vertebra Assessment & Plan: Osteoporosis confirmed by previous imaging. On Prolia every six months. Due for bone density scan. - Schedule bone density scan at the new medical center - Continue Prolia every six months      Meds ordered this encounter  Medications   diltiazem (CARDIZEM CD) 120 MG 24 hr capsule    Sig: Take 1 capsule (120 mg total) by mouth daily.    Dispense:  30 capsule    Refill:  0   zolpidem (AMBIEN) 5 MG tablet    Sig: Take 1 tablet (5 mg total) by mouth at bedtime as needed for sleep.    Dispense:  30 tablet    Refill:  5    Orders Placed This Encounter  Procedures   DG Bone Density   MM DIGITAL SCREENING BILATERAL   CBC with Differential/Platelet   Comprehensive metabolic panel   Lipid panel     Follow-up: Return in about 4 weeks (around 05/03/2023) for chronic follow up med change (with me or Dina/Brady).  Total time spent on today's visit was 40 minutes, including both face-to-face time and nonface-to-face time personally spent on review of chart (labs and imaging), discussing labs and goals, discussing further work-up, treatment options, referrals to specialist if needed, reviewing outside records of pertinent, answering patient's questions, and coordinating care.  I,Marla I Leal-Borjas,acting as a scribe for Blane Ohara, MD.,have documented all relevant documentation on the behalf of Blane Ohara, MD,as directed by  Blane Ohara, MD while in the presence of Blane Ohara, MD.   An After Visit Summary was  printed and given to the patient.  I attest that I have reviewed this visit and agree with the plan scribed by my staff.   Blane Ohara, MD [facility] [phone]

## 2023-04-05 ENCOUNTER — Ambulatory Visit (INDEPENDENT_AMBULATORY_CARE_PROVIDER_SITE_OTHER): Payer: Medicare Other | Admitting: Family Medicine

## 2023-04-05 ENCOUNTER — Other Ambulatory Visit: Payer: Self-pay | Admitting: Family Medicine

## 2023-04-05 VITALS — BP 110/62 | HR 69 | Temp 97.9°F | Ht 62.0 in | Wt 189.0 lb

## 2023-04-05 DIAGNOSIS — R000 Tachycardia, unspecified: Secondary | ICD-10-CM

## 2023-04-05 DIAGNOSIS — E1169 Type 2 diabetes mellitus with other specified complication: Secondary | ICD-10-CM | POA: Diagnosis not present

## 2023-04-05 DIAGNOSIS — Z780 Asymptomatic menopausal state: Secondary | ICD-10-CM

## 2023-04-05 DIAGNOSIS — G4733 Obstructive sleep apnea (adult) (pediatric): Secondary | ICD-10-CM | POA: Diagnosis not present

## 2023-04-05 DIAGNOSIS — K219 Gastro-esophageal reflux disease without esophagitis: Secondary | ICD-10-CM

## 2023-04-05 DIAGNOSIS — E1159 Type 2 diabetes mellitus with other circulatory complications: Secondary | ICD-10-CM

## 2023-04-05 DIAGNOSIS — J4540 Moderate persistent asthma, uncomplicated: Secondary | ICD-10-CM | POA: Diagnosis not present

## 2023-04-05 DIAGNOSIS — E782 Mixed hyperlipidemia: Secondary | ICD-10-CM

## 2023-04-05 DIAGNOSIS — F330 Major depressive disorder, recurrent, mild: Secondary | ICD-10-CM | POA: Diagnosis not present

## 2023-04-05 DIAGNOSIS — I110 Hypertensive heart disease with heart failure: Secondary | ICD-10-CM

## 2023-04-05 DIAGNOSIS — I5032 Chronic diastolic (congestive) heart failure: Secondary | ICD-10-CM | POA: Diagnosis not present

## 2023-04-05 DIAGNOSIS — Z1231 Encounter for screening mammogram for malignant neoplasm of breast: Secondary | ICD-10-CM

## 2023-04-05 DIAGNOSIS — M810 Age-related osteoporosis without current pathological fracture: Secondary | ICD-10-CM

## 2023-04-05 DIAGNOSIS — G4709 Other insomnia: Secondary | ICD-10-CM | POA: Diagnosis not present

## 2023-04-05 LAB — COMPREHENSIVE METABOLIC PANEL
ALT: 20 IU/L (ref 0–32)
AST: 27 IU/L (ref 0–40)
Albumin: 4.4 g/dL (ref 3.8–4.8)
Alkaline Phosphatase: 63 IU/L (ref 44–121)
BUN/Creatinine Ratio: 13 (ref 12–28)
BUN: 15 mg/dL (ref 8–27)
Bilirubin Total: 0.6 mg/dL (ref 0.0–1.2)
CO2: 22 mmol/L (ref 20–29)
Calcium: 9.3 mg/dL (ref 8.7–10.3)
Chloride: 101 mmol/L (ref 96–106)
Creatinine, Ser: 1.14 mg/dL — ABNORMAL HIGH (ref 0.57–1.00)
Globulin, Total: 2.6 g/dL (ref 1.5–4.5)
Glucose: 97 mg/dL (ref 70–99)
Potassium: 4.4 mmol/L (ref 3.5–5.2)
Sodium: 137 mmol/L (ref 134–144)
Total Protein: 7 g/dL (ref 6.0–8.5)
eGFR: 50 mL/min/{1.73_m2} — ABNORMAL LOW (ref >59)

## 2023-04-05 LAB — CBC WITH DIFFERENTIAL/PLATELET
Basophils Absolute: 0.1 10*3/uL (ref 0.0–0.2)
Basos: 1 %
EOS (ABSOLUTE): 0.4 10*3/uL (ref 0.0–0.4)
Eos: 6 %
Hematocrit: 42.7 % (ref 34.0–46.6)
Hemoglobin: 13.8 g/dL (ref 11.1–15.9)
Immature Grans (Abs): 0 10*3/uL (ref 0.0–0.1)
Immature Granulocytes: 0 %
Lymphocytes Absolute: 2.5 10*3/uL (ref 0.7–3.1)
Lymphs: 36 %
MCH: 29.2 pg (ref 26.6–33.0)
MCHC: 32.3 g/dL (ref 31.5–35.7)
MCV: 91 fL (ref 79–97)
Monocytes Absolute: 0.5 10*3/uL (ref 0.1–0.9)
Monocytes: 7 %
Neutrophils Absolute: 3.4 10*3/uL (ref 1.4–7.0)
Neutrophils: 50 %
Platelets: 211 10*3/uL (ref 150–450)
RBC: 4.72 x10E6/uL (ref 3.77–5.28)
RDW: 14 % (ref 11.7–15.4)
WBC: 6.8 10*3/uL (ref 3.4–10.8)

## 2023-04-05 LAB — LIPID PANEL
Chol/HDL Ratio: 2.8 ratio (ref 0.0–4.4)
Cholesterol, Total: 143 mg/dL (ref 100–199)
HDL: 51 mg/dL (ref >39)
LDL Chol Calc (NIH): 68 mg/dL (ref 0–99)
Triglycerides: 141 mg/dL (ref 0–149)
VLDL Cholesterol Cal: 24 mg/dL (ref 5–40)

## 2023-04-05 MED ORDER — ZOLPIDEM TARTRATE 5 MG PO TABS: 5.0000 mg | ORAL_TABLET | Freq: Every evening | ORAL | 5 refills | Status: DC | PRN

## 2023-04-05 MED ORDER — DILTIAZEM HCL ER COATED BEADS 120 MG PO CP24: 120.0000 mg | ORAL_CAPSULE | Freq: Every day | ORAL | 0 refills | Status: DC

## 2023-04-05 NOTE — Patient Instructions (Addendum)
 VISIT SUMMARY:  Diana Hendrix, a 78 year old female with asthma, visited today due to ongoing respiratory symptoms and medication side effects. She has been experiencing difficulty breathing, particularly in the mornings, and has tried various medications with mixed results. She also has a history of hypertension, depression, and osteoporosis. Recent imaging and tests have confirmed her asthma and the need for ongoing management of her other conditions.  YOUR PLAN:  -ASTHMA: Asthma is a condition where your airways narrow and swell, making it difficult to breathe. You are currently trying one inhalation of Stiolto daily to see if you can tolerate it without anxiety or other side effects. Continue using your albuterol inhaler as needed and follow up with your pulmonologist at the end of May. STOP metoprolol.   -HYPERTENSION: Hypertension, or high blood pressure, is being managed with olmesartan and metoprolol. Since metoprolol may worsen your asthma, we are switching you to diltiazem 120 mg daily. Please monitor your blood pressure and heart rate daily for the next 2-3 weeks and discontinue metoprolol. Follow up in 4 weeks to see how you are responding to diltiazem.  -DEPRESSION: Depression is being managed with citalopram. No changes to your current treatment plan are needed at this time.  -OSTEOPOROSIS: Osteoporosis is a condition where bones become weak and brittle. You are on Prolia every six months and are due for a bone density scan. Please schedule this scan at the new medical center.  -GENERAL HEALTH MAINTENANCE: You are due for a mammogram. Please schedule this at the new medical center.  INSTRUCTIONS:  Please follow up with your pulmonologist at the end of May for your asthma. Monitor your blood pressure and heart rate daily for the next 2-3 weeks and discontinue metoprolol. Follow up in 2-3 weeks to assess your response to diltiazem. Schedule a bone density scan and a mammogram at the new medical  center.

## 2023-04-06 ENCOUNTER — Encounter: Payer: Self-pay | Admitting: Family Medicine

## 2023-04-06 DIAGNOSIS — Z4889 Encounter for other specified surgical aftercare: Secondary | ICD-10-CM | POA: Diagnosis not present

## 2023-04-07 ENCOUNTER — Encounter: Payer: Self-pay | Admitting: Family Medicine

## 2023-04-08 ENCOUNTER — Encounter: Payer: Self-pay | Admitting: Family Medicine

## 2023-04-08 NOTE — Assessment & Plan Note (Deleted)
 Chronic asthma with recent exacerbation. Reports hoarseness with Trelegy and anxiety with Stiolto. Currently trying one inhalation of Stiolto daily to assess tolerance. Albuterol used sporadically, indicating some control of symptoms. Pulmonologist confirmed asthma diagnosis with CT scan showing air trapping. Reports improvement in wheezing and congestion, likely related to allergies. Discussed the balance between medication efficacy and side effects, emphasizing the importance of tolerability. - Continue one inhalation of Stiolto daily if tolerated - Monitor for anxiety or other side effects - Use albuterol inhaler as needed - Follow up with pulmonologist at the end of May

## 2023-04-08 NOTE — Assessment & Plan Note (Signed)
 Compliant with cpap.benefiting

## 2023-04-08 NOTE — Assessment & Plan Note (Signed)
 Osteoporosis confirmed by previous imaging. On Prolia every six months. Due for bone density scan. - Schedule bone density scan at the new medical center - Continue Prolia every six months

## 2023-04-08 NOTE — Assessment & Plan Note (Addendum)
Well-controlled.  Continue omeprazole. 

## 2023-04-08 NOTE — Assessment & Plan Note (Signed)
 Depression managed with citalopram.

## 2023-04-08 NOTE — Assessment & Plan Note (Addendum)
 Improved. Continue ambien 5 mg before bed.

## 2023-04-08 NOTE — Assessment & Plan Note (Signed)
 Well controlled.  Continue Rosuvastatin 40 mg daily.  Continue lovaza 1 gm 2 capsules twice daily.  Continue to work on eating a healthy diet and exercise.

## 2023-04-08 NOTE — Assessment & Plan Note (Addendum)
 Long-standing hypertension managed with olmesartan and metoprolol. Metoprolol may exacerbate asthma, but necessary for heart rate control. Plan to give a trial of diltiazem for rate control and discontinue metoprolol assess impact on asthma. Discussed the potential for diltiazem to manage heart rate without exacerbating asthma symptoms. - Start diltiazem 120 mg daily - Monitor blood pressure and heart rate daily for 2-3 weeks - Discontinue metoprolol - Follow up in 2-3 weeks to assess response to diltiazem

## 2023-04-08 NOTE — Assessment & Plan Note (Addendum)
 Not at goal.  - Continue one inhalation of Stiolto daily if tolerated - Monitor for anxiety or other side effects - Use albuterol inhaler as needed - Follow up with pulmonologist at the end of May

## 2023-04-14 ENCOUNTER — Other Ambulatory Visit: Payer: Self-pay | Admitting: Family Medicine

## 2023-04-14 DIAGNOSIS — F330 Major depressive disorder, recurrent, mild: Secondary | ICD-10-CM

## 2023-04-15 ENCOUNTER — Encounter: Payer: Self-pay | Admitting: Pulmonary Disease

## 2023-04-17 MED ORDER — STIOLTO RESPIMAT 2.5-2.5 MCG/ACT IN AERS: 2.0000 | INHALATION_SPRAY | Freq: Every day | RESPIRATORY_TRACT | 11 refills | Status: AC

## 2023-04-18 ENCOUNTER — Ambulatory Visit (HOSPITAL_BASED_OUTPATIENT_CLINIC_OR_DEPARTMENT_OTHER)
Admission: RE | Admit: 2023-04-18 | Discharge: 2023-04-18 | Disposition: A | Discharge status: Home / Self Care | Source: Ambulatory Visit | Attending: Family Medicine | Admitting: Family Medicine

## 2023-04-18 ENCOUNTER — Ambulatory Visit: Admitting: Family Medicine

## 2023-04-18 VITALS — BP 118/62 | HR 105 | Temp 97.6°F | Ht 62.0 in | Wt 189.0 lb

## 2023-04-18 DIAGNOSIS — J069 Acute upper respiratory infection, unspecified: Secondary | ICD-10-CM

## 2023-04-18 DIAGNOSIS — R918 Other nonspecific abnormal finding of lung field: Secondary | ICD-10-CM | POA: Diagnosis not present

## 2023-04-18 DIAGNOSIS — R0602 Shortness of breath: Secondary | ICD-10-CM | POA: Diagnosis not present

## 2023-04-18 DIAGNOSIS — R062 Wheezing: Secondary | ICD-10-CM | POA: Diagnosis not present

## 2023-04-18 DIAGNOSIS — J4521 Mild intermittent asthma with (acute) exacerbation: Secondary | ICD-10-CM

## 2023-04-18 MED ORDER — PREDNISONE 20 MG PO TABS: ORAL_TABLET | ORAL | 0 refills | Status: AC

## 2023-04-18 MED ORDER — AMOXICILLIN-POT CLAVULANATE 875-125 MG PO TABS: 1.0000 | ORAL_TABLET | Freq: Two times a day (BID) | ORAL | 0 refills | Status: AC

## 2023-04-18 NOTE — Assessment & Plan Note (Addendum)
 Asthma exacerbated, requiring frequent albuterol use. - Continue using albuterol inhaler as needed. - order chest xray stat

## 2023-04-18 NOTE — Progress Notes (Signed)
 Acute Office Visit  Subjective:    Patient ID: Diana Hendrix, female    DOB: 1945/08/28, 78 y.o.   MRN: [id number]  Chief Complaint  Patient presents with   Cough    Discussed the use of AI scribe software for clinical note transcription with the patient, who gave verbal consent to proceed.   HPI: Diana Hendrix "Diana Hendrix" is a 78 year old female with asthma who presents with a persistent cough and wheezing.  She has been experiencing a persistent cough that has worsened over the past couple of weeks, making it difficult to talk without coughing. The cough worsened after attending her grandson's tennis match on a windy day about a week ago. She reports a sensation of heat in her throat and chest, though she does not have a fever. No sinus pain, ear pain, or drainage. No positive tests for flu or COVID-19.  She has a history of asthma since last August, which has not been well-controlled despite multiple clinic visits. She uses an albuterol inhaler two to three times a day to manage symptoms, including wheezing that occurs regardless of her position. She has been using Stiolto, initially in the morning, but switched to the afternoon to reduce nervousness, now tolerating two puffs without issues.  She has been using various allergy medications, including Pataday, Benadryl, and Flonase, finding Benadryl most effective, taking it up to three times a day without experiencing drowsiness. Her previous doctor suggested that her symptoms might be due to allergies.  She has a history of back issues, including two fractures at T7 and T8, and underwent kyphoplasty seven weeks ago. She associates her lung issues with her back problems, as she experienced significant pain and was told her lungs were inflamed. A CT scan revealed two fractures, and she has had pneumonia twice since these issues began.  Past Medical History:  Diagnosis Date   Cancer (HCC)    Diabetes mellitus without complication  (HCC)    Diastolic heart failure (HCC)    Echo EF 60/65% Heart monitor was normal   GERD (gastroesophageal reflux disease)    History of colon polyps    Hyperlipidemia    Hypertension    Macular edema, cystoid    OSA (obstructive sleep apnea) 09/17/2012   Osteopenia    Plantar fasciitis 09/09/2019    Past Surgical History:  Procedure Laterality Date   CESAREAN SECTION     COLONOSCOPY  10/29/2013   Colonic polyp status post polypectomy. Mild sigmoid diverticulosis. Small internal hemorrhoids   FOOT NEUROMA SURGERY     s/p surgery in both feet   FOOT SURGERY Bilateral    Per patient   MENISCUS REPAIR Left 09/13/2016   miniscus repair right  Right 2019   SHOULDER SURGERY Left    arthroscopy.    TUBAL LIGATION      Family History  Problem Relation Age of Onset   Hyperlipidemia Mother    Heart disease Mother    Hypertension Mother    Atrial fibrillation Mother    CAD Mother    Congestive Heart Failure Mother    CVA Father    Hypertension Brother    Hypertension Son    Colon cancer Neg Hx    Esophageal cancer Neg Hx     Social History   Socioeconomic History   Marital status: Married    Spouse name: Latiesha Harada   Number of children: 2   Years of education: Not on file   Highest education level:  Associate degree: occupational, technical, or vocational program  Occupational History   Not on file  Tobacco Use   Smoking status: Never   Smokeless tobacco: Never  Vaping Use   Vaping status: Never Used  Substance and Sexual Activity   Alcohol use: No   Drug use: No   Sexual activity: Not Currently  Other Topics Concern   Not on file  Social History Narrative   Lives with husband   Right handed   Caffeine: 1 cup of tea and 1 pepsi in the AM   Social Drivers of Health   Financial Resource Strain: Low Risk  (04/18/2023)   Overall Financial Resource Strain (CARDIA)    Difficulty of Paying Living Expenses: Not hard at all  Food Insecurity: No Food Insecurity  (04/18/2023)   Hunger Vital Sign    Worried About Running Out of Food in the Last Year: Never true    Ran Out of Food in the Last Year: Never true  Transportation Needs: No Transportation Needs (04/18/2023)   PRAPARE - Administrator, Civil Service (Medical): No    Lack of Transportation (Non-Medical): No  Physical Activity: Sufficiently Active (04/18/2023)   Exercise Vital Sign    Days of Exercise per Week: 3 days    Minutes of Exercise per Session: 60 min  Stress: No Stress Concern Present (04/18/2023)   [employer] of Occupational Health - Occupational Stress Questionnaire    Feeling of Stress : Not at all  Social Connections: Unknown (04/18/2023)   Social Connection and Isolation Panel [NHANES]    Frequency of Communication with Friends and Family: More than three times a week    Frequency of Social Gatherings with Friends and Family: Patient declined    Attends Religious Services: Patient declined    Database administrator or Organizations: No    Attends Engineer, structural: Not on file    Marital Status: Married  Catering manager Violence: Not At Risk (11/10/2021)   Humiliation, Afraid, Rape, and Kick questionnaire    Fear of Current or Ex-Partner: No    Emotionally Abused: No    Physically Abused: No    Sexually Abused: No    Outpatient Medications Prior to Visit  Medication Sig Dispense Refill   albuterol (VENTOLIN HFA) 108 (90 Base) MCG/ACT inhaler Inhale 2 puffs into the lungs every 6 (six) hours as needed for wheezing or shortness of breath. 8 g 1   Cholecalciferol (VITAMIN D3) 50 MCG (2000 UT) TABS Take 1 tablet by mouth daily.     citalopram (CELEXA) 10 MG tablet TAKE 1 TABLET(10 MG) BY MOUTH DAILY 90 tablet 1   diltiazem (CARDIZEM CD) 120 MG 24 hr capsule Take 1 capsule (120 mg total) by mouth daily. 30 capsule 0   fluticasone (FLONASE) 50 MCG/ACT nasal spray Place 2 sprays into both nostrils daily. 16 g 6   furosemide (LASIX) 20 MG tablet Take 1  tablet (20 mg total) by mouth daily. 90 tablet 3   montelukast (SINGULAIR) 10 MG tablet TAKE 1 TABLET(10 MG) BY MOUTH AT BEDTIME 90 tablet 1   NON FORMULARY CPAP at bedtime     olmesartan (BENICAR) 20 MG tablet Take 1 tablet (20 mg total) by mouth daily. 90 tablet 3   omega-3 acid ethyl esters (LOVAZA) 1 g capsule TAKE 2 CAPSULES BY MOUTH TWICE DAILY 360 capsule 2   omeprazole (PRILOSEC) 40 MG capsule TAKE 1 CAPSULE(40 MG) BY MOUTH IN THE MORNING AND AT BEDTIME 180  capsule 0   Polyethyl Glycol-Propyl Glycol (SYSTANE OP) Place 1 drop into both eyes 4 (four) times daily.     polyethylene glycol (MIRALAX / GLYCOLAX) 17 g packet Take 17 g by mouth daily.     rosuvastatin (CRESTOR) 40 MG tablet TAKE 1 TABLET(40 MG) BY MOUTH DAILY 90 tablet 1   Tiotropium Bromide-Olodaterol (STIOLTO RESPIMAT) 2.5-2.5 MCG/ACT AERS Inhale into the lungs.     Tiotropium Bromide-Olodaterol (STIOLTO RESPIMAT) 2.5-2.5 MCG/ACT AERS Inhale 2 puffs into the lungs daily. 1 each 11   zolpidem (AMBIEN) 5 MG tablet Take 1 tablet (5 mg total) by mouth at bedtime as needed for sleep. 30 tablet 5   Facility-Administered Medications Prior to Visit  Medication Dose Route Frequency Provider Last Rate Last Admin   [START ON 05/10/2023] denosumab (PROLIA) injection 60 mg  60 mg Subcutaneous Q6 months Cox, Kirsten, MD        Allergies  Allergen Reactions   Cortisone Other (See Comments)    Passed out according to patient can take prednisone   Glucosamine Other (See Comments)   Zyrtec [Cetirizine]     Weird dreams.    Ibuprofen Other (See Comments)    Pt states Dr doesn't wont her to take it; not allergic    Review of Systems  Constitutional:  Negative for appetite change, fatigue and fever.  HENT:  Positive for rhinorrhea and sneezing. Negative for congestion, ear pain, sinus pressure and sore throat.   Eyes:  Positive for itching.  Respiratory:  Positive for cough and wheezing. Negative for chest tightness and shortness of  breath.   Cardiovascular:  Negative for chest pain and palpitations.  Gastrointestinal:  Negative for abdominal pain, constipation, diarrhea, nausea and vomiting.  Genitourinary:  Negative for dysuria and hematuria.  Musculoskeletal:  Positive for back pain. Negative for arthralgias, joint swelling and myalgias.  Skin:  Negative for rash.  Neurological:  Negative for dizziness, weakness and headaches.  Psychiatric/Behavioral:  Negative for dysphoric mood. The patient is not nervous/anxious.        Objective:        04/18/2023    2:47 PM 04/05/2023    9:35 AM 03/20/2023    4:10 PM  Vitals with BMI  Height 5\' 2"  5\' 2"  5\' 2"   Weight 189 lbs 189 lbs 189 lbs  BMI 34.56 34.56 34.56  Systolic 118 110 161  Diastolic 62 62 64  Pulse 105 69 74    Orthostatic VS for the past 72 hrs (Last 3 readings):  Patient Position BP Location  04/18/23 1447 Sitting Left Arm     Physical Exam Constitutional:      General: She is not in acute distress.    Appearance: Normal appearance. She is ill-appearing.  Cardiovascular:     Rate and Rhythm: Normal rate and regular rhythm.     Heart sounds: Normal heart sounds. No murmur heard. Pulmonary:     Effort: Pulmonary effort is normal.     Breath sounds: Examination of the left-lower field reveals decreased breath sounds and rales. Decreased breath sounds and rales present. No wheezing.  Abdominal:     General: Bowel sounds are normal.     Palpations: Abdomen is soft.     Tenderness: There is no abdominal tenderness.  Neurological:     Mental Status: She is alert. Mental status is at baseline.  Psychiatric:        Mood and Affect: Mood normal.        Behavior: Behavior normal.  Health Maintenance Due  Topic Date Due   Medicare Annual Wellness (AWV)  01/24/2023   Diabetic kidney evaluation - Urine ACR  02/02/2023   MAMMOGRAM  04/12/2023   OPHTHALMOLOGY EXAM  04/05/2023    There are no preventive care reminders to display for this  patient.   Lab Results  Component Value Date   TSH 1.810 02/09/2021   Lab Results  Component Value Date   WBC 6.8 04/05/2023   HGB 13.8 04/05/2023   HCT 42.7 04/05/2023   MCV 91 04/05/2023   PLT 211 04/05/2023   Lab Results  Component Value Date   NA 137 04/05/2023   K 4.4 04/05/2023   CO2 22 04/05/2023   GLUCOSE 97 04/05/2023   BUN 15 04/05/2023   CREATININE 1.14 (H) 04/05/2023   BILITOT 0.6 04/05/2023   ALKPHOS 63 04/05/2023   AST 27 04/05/2023   ALT 20 04/05/2023   PROT 7.0 04/05/2023   ALBUMIN 4.4 04/05/2023   CALCIUM 9.3 04/05/2023   ANIONGAP 8 11/13/2021   EGFR 50 (L) 04/05/2023   GFR 55.96 (L) 11/25/2019   Lab Results  Component Value Date   CHOL 143 04/05/2023   Lab Results  Component Value Date   HDL 51 04/05/2023   Lab Results  Component Value Date   LDLCALC 68 04/05/2023   Lab Results  Component Value Date   TRIG 141 04/05/2023   Lab Results  Component Value Date   CHOLHDL 2.8 04/05/2023   Lab Results  Component Value Date   HGBA1C 6.3 (H) 12/20/2022       Assessment & Plan:  Upper respiratory tract infection, unspecified type Assessment & Plan: Persistent cough with wheezing and crackles suggests possible pneumonia. History of asthma and frequent albuterol use. Chest x-ray needed for confirmation. - Order chest x-ray to evaluate for pneumonia. - Prescribe Augmentin and, if pneumonia is confirmed, add a Z-Pak.  Orders: -     Amoxicillin-Pot Clavulanate; Take 1 tablet by mouth 2 (two) times daily for 7 days.  Dispense: 14 tablet; Refill: 0  Asthma in adult, mild intermittent, with acute exacerbation Assessment & Plan: Asthma exacerbated, requiring frequent albuterol use. Stiolto administration adjusted to afternoon to avoid nervousness. - Continue using albuterol inhaler as needed. - Continue Stiolto in the afternoon.   Orders: -     predniSONE; Take 3 tablets (60 mg total) by mouth daily with breakfast for 3 days, THEN 2 tablets  (40 mg total) daily with breakfast for 3 days, THEN 1 tablet (20 mg total) daily with breakfast for 3 days.  Dispense: 18 tablet; Refill: 0  Wheezing Assessment & Plan: Asthma exacerbated, requiring frequent albuterol use. - Continue using albuterol inhaler as needed. - order chest xray stat  Orders: -     DG Chest 2 View; Future     Meds ordered this encounter  Medications   predniSONE (DELTASONE) 20 MG tablet    Sig: Take 3 tablets (60 mg total) by mouth daily with breakfast for 3 days, THEN 2 tablets (40 mg total) daily with breakfast for 3 days, THEN 1 tablet (20 mg total) daily with breakfast for 3 days.    Dispense:  18 tablet    Refill:  0   amoxicillin-clavulanate (AUGMENTIN) 875-125 MG tablet    Sig: Take 1 tablet by mouth 2 (two) times daily for 7 days.    Dispense:  14 tablet    Refill:  0    Orders Placed This Encounter  Procedures  DG Chest 2 View     Follow-up: Return if symptoms worsen or fail to improve.  An After Visit Summary was printed and given to the patient.  Total time spent on today's visit was 30 minutes, including both face-to-face time and nonface-to-face time personally spent on review of chart (labs and imaging), discussing labs and goals, discussing further work-up, treatment options, referrals to specialist if needed, reviewing outside records if pertinent, answering patient's questions, and coordinating care.    Lajuana Matte, FNP [facility] [phone]

## 2023-04-18 NOTE — Assessment & Plan Note (Signed)
 Asthma exacerbated, requiring frequent albuterol use. Stiolto administration adjusted to afternoon to avoid nervousness. - Continue using albuterol inhaler as needed. - Continue Stiolto in the afternoon.

## 2023-04-18 NOTE — Assessment & Plan Note (Signed)
 Persistent cough with wheezing and crackles suggests possible pneumonia. History of asthma and frequent albuterol use. Chest x-ray needed for confirmation. - Order chest x-ray to evaluate for pneumonia. - Prescribe Augmentin and, if pneumonia is confirmed, add a Z-Pak.

## 2023-04-19 ENCOUNTER — Other Ambulatory Visit: Payer: Self-pay | Admitting: Family Medicine

## 2023-04-19 DIAGNOSIS — J189 Pneumonia, unspecified organism: Secondary | ICD-10-CM

## 2023-04-19 MED ORDER — AZITHROMYCIN 250 MG PO TABS: ORAL_TABLET | ORAL | 0 refills | Status: DC

## 2023-04-21 ENCOUNTER — Other Ambulatory Visit: Payer: Self-pay | Admitting: Cardiology

## 2023-04-23 ENCOUNTER — Other Ambulatory Visit: Payer: Self-pay | Admitting: Cardiology

## 2023-04-23 ENCOUNTER — Other Ambulatory Visit: Payer: Self-pay | Admitting: Physician Assistant

## 2023-04-23 ENCOUNTER — Encounter (HOSPITAL_BASED_OUTPATIENT_CLINIC_OR_DEPARTMENT_OTHER): Payer: Self-pay | Admitting: Radiology

## 2023-04-23 ENCOUNTER — Ambulatory Visit (HOSPITAL_BASED_OUTPATIENT_CLINIC_OR_DEPARTMENT_OTHER)
Admission: RE | Admit: 2023-04-23 | Discharge: 2023-04-23 | Disposition: A | Discharge status: Home / Self Care | Source: Ambulatory Visit | Attending: Family Medicine | Admitting: Radiology

## 2023-04-23 ENCOUNTER — Ambulatory Visit: Payer: Self-pay | Admitting: Pulmonary Disease

## 2023-04-23 ENCOUNTER — Ambulatory Visit (INDEPENDENT_AMBULATORY_CARE_PROVIDER_SITE_OTHER): Admitting: Physician Assistant

## 2023-04-23 ENCOUNTER — Ambulatory Visit (HOSPITAL_BASED_OUTPATIENT_CLINIC_OR_DEPARTMENT_OTHER)
Admission: RE | Admit: 2023-04-23 | Discharge: 2023-04-23 | Disposition: A | Discharge status: Home / Self Care | Source: Ambulatory Visit | Attending: Family Medicine | Admitting: Family Medicine

## 2023-04-23 VITALS — BP 118/62 | HR 85 | Temp 97.8°F | Ht 62.0 in | Wt 191.0 lb

## 2023-04-23 DIAGNOSIS — Z1231 Encounter for screening mammogram for malignant neoplasm of breast: Secondary | ICD-10-CM

## 2023-04-23 DIAGNOSIS — M810 Age-related osteoporosis without current pathological fracture: Secondary | ICD-10-CM

## 2023-04-23 DIAGNOSIS — I152 Hypertension secondary to endocrine disorders: Secondary | ICD-10-CM

## 2023-04-23 DIAGNOSIS — R051 Acute cough: Secondary | ICD-10-CM

## 2023-04-23 DIAGNOSIS — Z780 Asymptomatic menopausal state: Secondary | ICD-10-CM | POA: Diagnosis not present

## 2023-04-23 DIAGNOSIS — J4531 Mild persistent asthma with (acute) exacerbation: Secondary | ICD-10-CM | POA: Diagnosis not present

## 2023-04-23 DIAGNOSIS — E1159 Type 2 diabetes mellitus with other circulatory complications: Secondary | ICD-10-CM

## 2023-04-23 DIAGNOSIS — Z13820 Encounter for screening for osteoporosis: Secondary | ICD-10-CM

## 2023-04-23 MED ORDER — DILTIAZEM HCL ER 180 MG PO TB24: 180.0000 mg | ORAL_TABLET | Freq: Every day | ORAL | 0 refills | Status: DC

## 2023-04-23 NOTE — Assessment & Plan Note (Signed)
 Confirmed pneumonia on chest x-ray. Persistent symptoms despite azithromycin and prednisone. Potential asthma exacerbation and trapped air. Discussed excessive antibiotic use with pulmonologist. - Prescribe second antibiotic with current Augmentin. - Order chest CT for persistent opacity and trapped air. - Consult pulmonologist Dr. Babs Bertin for management and antibiotic use.

## 2023-04-23 NOTE — Telephone Encounter (Signed)
 Prescription sent to pharmacy.

## 2023-04-23 NOTE — Assessment & Plan Note (Signed)
 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 On Prolia. Previous Fosamax ineffective. Discussed monitoring bone density. - Continue Prolia. - Monitor bone density, compare with previous DEXA. - Advise on fall prevention.

## 2023-04-23 NOTE — Progress Notes (Unsigned)
 Acute Office Visit  Subjective:    Patient ID: Diana Hendrix, female    DOB: 06/21/1945, 78 y.o.   MRN: [id number]  Chief Complaint  Patient presents with   Cough    HPI: Patient is in today for pneumonia states she is not feeling any better, still coughing ("croupy") finished zpak this morning, 2 days left of amoxicillin and 4 days of prednisone.  Discussed the use of AI scribe software for clinical note transcription with the patient, who gave verbal consent to proceed.  History of Present Illness   The patient, with a history of asthma and pneumonia, presents with a worsening cough. The cough has been persistent and has gradually worsened over the past month. The patient reports that the cough is congested and has been causing significant discomfort. The patient has been on Stiolto for the cough, but reports that it may have exacerbated her asthma, leading to severe wheezing. The patient was also on Augmentin and azithromycin for a suspected pneumonia, but the cough has not improved significantly. The patient also reports feeling congested from the chest to the throat, causing hoarseness.  In addition to the respiratory issues, the patient also has a history of osteoporosis. The patient has been on Prolia for the past six months. The patient reports that she has not been able to lay on her left side due to discomfort, which she describes as feeling like there is a "football" in there. The patient also reports that her heart rate has been higher than usual, which she attributes to the prednisone she has been taking.       Past Medical History:  Diagnosis Date   Cancer (HCC)    Diabetes mellitus without complication (HCC)    Diastolic heart failure (HCC)    Echo EF 60/65% Heart monitor was normal   GERD (gastroesophageal reflux disease)    History of colon polyps    Hyperlipidemia    Hypertension    Macular edema, cystoid    OSA (obstructive sleep apnea) 09/17/2012    Osteopenia    Plantar fasciitis 09/09/2019    Past Surgical History:  Procedure Laterality Date   CESAREAN SECTION     COLONOSCOPY  10/29/2013   Colonic polyp status post polypectomy. Mild sigmoid diverticulosis. Small internal hemorrhoids   FOOT NEUROMA SURGERY     s/p surgery in both feet   FOOT SURGERY Bilateral    Per patient   MENISCUS REPAIR Left 09/13/2016   miniscus repair right  Right 2019   SHOULDER SURGERY Left    arthroscopy.    TUBAL LIGATION      Family History  Problem Relation Age of Onset   Hyperlipidemia Mother    Heart disease Mother    Hypertension Mother    Atrial fibrillation Mother    CAD Mother    Congestive Heart Failure Mother    CVA Father    Hypertension Brother    Hypertension Son    Colon cancer Neg Hx    Esophageal cancer Neg Hx     Social History   Socioeconomic History   Marital status: Married    Spouse name: Engineer, production   Number of children: 2   Years of education: Not on file   Highest education level: Associate degree: occupational, Scientist, product/process development, or vocational program  Occupational History   Not on file  Tobacco Use   Smoking status: Never   Smokeless tobacco: Never  Vaping Use   Vaping status: Never Used  Substance  and Sexual Activity   Alcohol use: No   Drug use: No   Sexual activity: Not Currently  Other Topics Concern   Not on file  Social History Narrative   Lives with husband   Right handed   Caffeine: 1 cup of tea and 1 pepsi in the AM   Social Drivers of Health   Financial Resource Strain: Low Risk  (04/18/2023)   Overall Financial Resource Strain (CARDIA)    Difficulty of Paying Living Expenses: Not hard at all  Food Insecurity: No Food Insecurity (04/18/2023)   Hunger Vital Sign    Worried About Running Out of Food in the Last Year: Never true    Ran Out of Food in the Last Year: Never true  Transportation Needs: No Transportation Needs (04/18/2023)   PRAPARE - Administrator, Civil Service  (Medical): No    Lack of Transportation (Non-Medical): No  Physical Activity: Sufficiently Active (04/18/2023)   Exercise Vital Sign    Days of Exercise per Week: 3 days    Minutes of Exercise per Session: 60 min  Stress: No Stress Concern Present (04/18/2023)   [employer] of Occupational Health - Occupational Stress Questionnaire    Feeling of Stress : Not at all  Social Connections: Unknown (04/18/2023)   Social Connection and Isolation Panel [NHANES]    Frequency of Communication with Friends and Family: More than three times a week    Frequency of Social Gatherings with Friends and Family: Patient declined    Attends Religious Services: Patient declined    Database administrator or Organizations: No    Attends Engineer, structural: Not on file    Marital Status: Married  Catering manager Violence: Not At Risk (11/10/2021)   Humiliation, Afraid, Rape, and Kick questionnaire    Fear of Current or Ex-Partner: No    Emotionally Abused: No    Physically Abused: No    Sexually Abused: No    Outpatient Medications Prior to Visit  Medication Sig Dispense Refill   albuterol (VENTOLIN HFA) 108 (90 Base) MCG/ACT inhaler Inhale 2 puffs into the lungs every 6 (six) hours as needed for wheezing or shortness of breath. 8 g 1   amoxicillin-clavulanate (AUGMENTIN) 875-125 MG tablet Take 1 tablet by mouth 2 (two) times daily for 7 days. 14 tablet 0   azithromycin (ZITHROMAX) 250 MG tablet 2 DAILY FOR FIRST DAY, THEN DECREASE TO ONE DAILY FOR 4 MORE DAYS. 6 tablet 0   Cholecalciferol (VITAMIN D3) 50 MCG (2000 UT) TABS Take 1 tablet by mouth daily.     citalopram (CELEXA) 10 MG tablet TAKE 1 TABLET(10 MG) BY MOUTH DAILY 90 tablet 1   fluticasone (FLONASE) 50 MCG/ACT nasal spray Place 2 sprays into both nostrils daily. 16 g 6   furosemide (LASIX) 20 MG tablet Take 1 tablet (20 mg total) by mouth daily. Patient needs to make an appointment for further refills. 1st attempt. 30 tablet 0    montelukast (SINGULAIR) 10 MG tablet TAKE 1 TABLET(10 MG) BY MOUTH AT BEDTIME 90 tablet 1   NON FORMULARY CPAP at bedtime     olmesartan (BENICAR) 20 MG tablet Take 1 tablet (20 mg total) by mouth daily. 90 tablet 3   omega-3 acid ethyl esters (LOVAZA) 1 g capsule TAKE 2 CAPSULES BY MOUTH TWICE DAILY 360 capsule 2   omeprazole (PRILOSEC) 40 MG capsule TAKE 1 CAPSULE(40 MG) BY MOUTH IN THE MORNING AND AT BEDTIME 180 capsule 0  Polyethyl Glycol-Propyl Glycol (SYSTANE OP) Place 1 drop into both eyes 4 (four) times daily.     polyethylene glycol (MIRALAX / GLYCOLAX) 17 g packet Take 17 g by mouth daily.     predniSONE (DELTASONE) 20 MG tablet Take 3 tablets (60 mg total) by mouth daily with breakfast for 3 days, THEN 2 tablets (40 mg total) daily with breakfast for 3 days, THEN 1 tablet (20 mg total) daily with breakfast for 3 days. 18 tablet 0   rosuvastatin (CRESTOR) 40 MG tablet TAKE 1 TABLET(40 MG) BY MOUTH DAILY 90 tablet 1   Tiotropium Bromide-Olodaterol (STIOLTO RESPIMAT) 2.5-2.5 MCG/ACT AERS Inhale into the lungs.     Tiotropium Bromide-Olodaterol (STIOLTO RESPIMAT) 2.5-2.5 MCG/ACT AERS Inhale 2 puffs into the lungs daily. 1 each 11   zolpidem (AMBIEN) 5 MG tablet Take 1 tablet (5 mg total) by mouth at bedtime as needed for sleep. 30 tablet 5   diltiazem (CARDIZEM CD) 120 MG 24 hr capsule Take 1 capsule (120 mg total) by mouth daily. 30 capsule 0   Facility-Administered Medications Prior to Visit  Medication Dose Route Frequency Provider Last Rate Last Admin   [START ON 05/10/2023] denosumab (PROLIA) injection 60 mg  60 mg Subcutaneous Q6 months Cox, Kirsten, MD        Allergies  Allergen Reactions   Cortisone Other (See Comments)    Passed out according to patient can take prednisone   Glucosamine Other (See Comments)   Zyrtec [Cetirizine]     Weird dreams.    Ibuprofen Other (See Comments)    Pt states Dr doesn't wont her to take it; not allergic    Review of Systems   Constitutional:  Positive for fatigue. Negative for chills and fever.  HENT:  Positive for congestion. Negative for ear pain, postnasal drip, rhinorrhea, sinus pressure, sinus pain and sore throat.   Respiratory:  Positive for cough and shortness of breath.   Cardiovascular:  Negative for chest pain.  Gastrointestinal:  Negative for diarrhea and nausea.  Neurological:  Negative for dizziness and headaches.       Objective:        04/23/2023    1:08 PM 04/18/2023    2:47 PM 04/05/2023    9:35 AM  Vitals with BMI  Height 5\' 2"  5\' 2"  5\' 2"   Weight 191 lbs 189 lbs 189 lbs  BMI 34.93 34.56 34.56  Systolic 118 118 161  Diastolic 62 62 62  Pulse 85 105 69    No data found.   Physical Exam Vitals reviewed.  Constitutional:      Appearance: Normal appearance.  Neck:     Vascular: No carotid bruit.  Cardiovascular:     Rate and Rhythm: Normal rate and regular rhythm.     Heart sounds: Normal heart sounds.  Pulmonary:     Effort: Pulmonary effort is normal.     Breath sounds: Wheezing and rhonchi present.  Abdominal:     General: Bowel sounds are normal.     Palpations: Abdomen is soft.     Tenderness: There is no abdominal tenderness.  Neurological:     Mental Status: She is alert and oriented to person, place, and time.  Psychiatric:        Mood and Affect: Mood normal.        Behavior: Behavior normal.     Health Maintenance Due  Topic Date Due   Medicare Annual Wellness (AWV)  01/24/2023   Diabetic kidney evaluation - Urine ACR  02/02/2023   MAMMOGRAM  04/12/2023   OPHTHALMOLOGY EXAM  04/05/2023    There are no preventive care reminders to display for this patient.   Lab Results  Component Value Date   TSH 1.810 02/09/2021   Lab Results  Component Value Date   WBC 6.8 04/05/2023   HGB 13.8 04/05/2023   HCT 42.7 04/05/2023   MCV 91 04/05/2023   PLT 211 04/05/2023   Lab Results  Component Value Date   NA 137 04/05/2023   K 4.4 04/05/2023   CO2 22  04/05/2023   GLUCOSE 97 04/05/2023   BUN 15 04/05/2023   CREATININE 1.14 (H) 04/05/2023   BILITOT 0.6 04/05/2023   ALKPHOS 63 04/05/2023   AST 27 04/05/2023   ALT 20 04/05/2023   PROT 7.0 04/05/2023   ALBUMIN 4.4 04/05/2023   CALCIUM 9.3 04/05/2023   ANIONGAP 8 11/13/2021   EGFR 50 (L) 04/05/2023   GFR 55.96 (L) 11/25/2019   Lab Results  Component Value Date   CHOL 143 04/05/2023   Lab Results  Component Value Date   HDL 51 04/05/2023   Lab Results  Component Value Date   LDLCALC 68 04/05/2023   Lab Results  Component Value Date   TRIG 141 04/05/2023   Lab Results  Component Value Date   CHOLHDL 2.8 04/05/2023   Lab Results  Component Value Date   HGBA1C 6.3 (H) 12/20/2022       Assessment & Plan:  Hypertension associated with diabetes (HCC) Assessment & Plan: Elevated heart rate possibly from prednisone. Discontinued metoprolol. Current diltiazem to be increased. - Increase diltiazem to 180 mg. - Monitor blood pressure and heart rate thrice weekly post-prednisone. - Follow up in 10 days for heart rate and blood pressure.  Orders: -     dilTIAZem HCl ER; Take 1 tablet (180 mg total) by mouth daily.  Dispense: 30 tablet; Refill: 0  Osteoporosis of vertebra Assessment & Plan: Osteoporosis in left forearm, osteopenia in left hip. On Prolia. Previous Fosamax ineffective. Discussed monitoring bone density. - Continue Prolia. - Monitor bone density, compare with previous DEXA. - Advise on fall prevention.   Acute cough Assessment & Plan: Confirmed pneumonia on chest x-ray. Persistent symptoms despite azithromycin and prednisone. Potential asthma exacerbation and trapped air. Discussed excessive antibiotic use with pulmonologist. - Prescribe second antibiotic with current Augmentin. - Order chest CT for persistent opacity and trapped air. - Consult pulmonologist Dr. Babs Bertin for management and antibiotic use.   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 Current inhaler regimen partially effective. Discussed symptom overlap with pneumonia. - Continue inhaler regimen with adjusted timing. - Consider increasing inhaler frequency, monitor for side effects.       Meds ordered this encounter  Medications   diltiazem (CARDIZEM LA) 180 MG 24 hr tablet    Sig: Take 1 tablet (180 mg total) by mouth daily.    Dispense:  30 tablet    Refill:  0    No orders of the defined types were placed in this encounter.   General Health Maintenance Recent DEXA and mammogram performed. Awaiting mammogram results. - Review mammogram results when available.  Follow-up Planned to monitor lung condition, heart rate, and blood pressure post-medication adjustments. - Follow up in 10 days for lung and cardiovascular status. - Contact with pulmonologist's feedback on CT and antibiotic plan.       Follow-up: No follow-ups on file.  An After Visit  Summary was printed and given to the patient.  Langley Gauss, [facility] [phone]

## 2023-04-23 NOTE — Telephone Encounter (Signed)
 Copied from CRM [id number]. Topic: Clinical - Red Word Triage >> Apr 23, 2023  7:34 AM Gaetano Hawthorne wrote: [facility] that prompted transfer to Nurse Triage: Patient states that she's been experiencing shortness of breath and difficulty with breathing even with the use of her inhaler - patient has been diagnosis with pneumonia x 3 times and has had an X-ray - Patient initially called to see if they could make an appointment with Dr Judeth Horn.  TRIAGE SUMMARY NOTE: Pt reporting that she has had ongoing issues with pneumonia since August 2024, worsening wheezing since 3/1, recently diagnosed by primary care with 3rd bout of pneumonia, currently on last day of z-pak, has 2 amoxicillin left, and 4 more days of prednisone, but not felt real improvement. Pt reporting that she has still been "real crackly" even with albuterol 2-3x per day every 6 hours as prescribed and stiolto, reporting that "every time I breathe, it grinds." Pt confirms she is "not struggling, just kind of uncomfortable." Pt has productive cough but "not enough to get up," not spitting it up, unable to assess color. Pt reporting that the "SOB is not what's bothering me, it's the grinding in my chest that will not stop." Advised pt be examined again today, no availability with Dr. Judeth Horn today, pt declines appts with other pulm docs, advised pt see PCP again. Offered to schedule with pt's PCP, pt declines, she will schedule online. Advised taking albuterol inhaler every 4 hours as needed for the time being, and if really having a hard time to try rescue treatments with albuterol inhaler 2-3x 20 min apart. Advised call back if worsening, advised ED if struggling to breathe, pt verbalized understanding.   E2C2 Pulmonary Triage - Initial Assessment Questions "Chief Complaint (e.g., cough, sob, wheezing, fever, chills, sweat or additional symptoms) *Go to specific symptom protocol after initial questions. SOB even with use of inhaler Saw Dr. Judeth Horn a  month ago then was fine, new inhaler and meds On Saturday after that, woodfire started wheezing kept getting worse, saw primary care, could hear crackle in lung, chest x-ray said no pneumonia, said to monitor but continually gotten worse, saw primary care, barking like a dog so tight in chest, chest x-ray said pneumonia, amoxicillin, z-pak, and prednisone Still real crackly even with albuterol and stiolto, every time I breathe, it grinds Last day of z-pak 2 amoxicillin left 4 more days of prednisone Asthma's not been controlled since last August, pneumonia 3x, don't think ever got better No chest pain or dizziness, got to coughing so bad that got real lightheaded but got over it Coughing is wet but not spitting it out Can't tell if it helps Crackling grinding sound with breathing in and out Don't know if can call it severe or not because still breathing, worst I've had of the pneumonia Speaking in full sentences Not struggling just kind of uncomfortable Was using every 6 hours, sometimes less than 6 hours, been using more like 2-3x a day SOB not what's bothering me, it's the grinding in my chest that will not stop Feel like need to cough several times but not been because been controlling it  "How long have symptoms been present?" 3/1 first started wheezing with woodfire  MEDICINES:   "Have you used any OTC meds to help with symptoms?" Yes If yes, ask "What medications?" Benadryl not for past couple days though tylenol  "Have you used your inhalers/maintenance medication?" Yes If yes, "What medications?" Albuterol, stiolto  If inhaler, ask "How  many puffs and how often?" Note: Review instructions on medication in the chart. 1-3x per day  OXYGEN: "Do you wear supplemental oxygen?" No  "Do you monitor your oxygen levels?" No  Reason for Disposition  [1] Taking antibiotic > 48 hours (2 days) for pneumonia AND [2] breathing not improved  Answer Assessment - Initial Assessment  Questions 7. INHALED QUICK-RELIEF TREATMENTS FOR THIS ATTACK: "What treatments have you given yourself so far?" and "How many and how often?" If using an inhaler, ask, "How many puffs?" Note: Routine treatments are 2 puffs every 4 hours as needed. Rescue treatments are 4 puffs repeated every 20 minutes, up to three times as needed.      Been using every 6 hours as prescribed, advised routine q4h for now and rescue treatments  Answer Assessment - Initial Assessment Questions 1. SYMPTOM: "What's the main symptom you're concerned about?" (e.g., breathing difficulty, fever, weakness)     Grinding in lungs 10. HOSPITAL ADMISSION: "Were you hospitalized for this pneumonia?" If Yes, ask: "When were you discharged home from the hospital?"       no  Protocols used: Asthma Attack-A-AH, Pneumonia Follow-up Call-A-AH

## 2023-04-23 NOTE — Assessment & Plan Note (Signed)
 Possible exacerbation from wood fire exposure. Current inhaler regimen partially effective. Discussed symptom overlap with pneumonia. - Continue inhaler regimen with adjusted timing. - Consider increasing inhaler frequency, monitor for side effects.

## 2023-04-23 NOTE — Assessment & Plan Note (Signed)
 Elevated heart rate possibly from prednisone. Discontinued metoprolol. Current diltiazem to be increased. - Increase diltiazem to 180 mg. - Monitor blood pressure and heart rate thrice weekly post-prednisone. - Follow up in 10 days for heart rate and blood pressure.

## 2023-04-23 NOTE — Telephone Encounter (Signed)
 Called and spoke with [place]. She is going to seek care at PCP today. Pt scheduled for ov 5/28 with Dr. Judeth Horn. I offered pt appt openings for today but pt stated she would only like to see MH. Advised pt if symptoms seem to worsen to seek emergency help and pt advised she would call our office with any other concerns or questions. NFN

## 2023-04-24 ENCOUNTER — Encounter: Payer: Self-pay | Admitting: Physician Assistant

## 2023-04-25 ENCOUNTER — Encounter: Payer: Self-pay | Admitting: Family Medicine

## 2023-04-25 ENCOUNTER — Other Ambulatory Visit: Payer: Self-pay | Admitting: Physician Assistant

## 2023-04-25 DIAGNOSIS — R051 Acute cough: Secondary | ICD-10-CM

## 2023-04-25 MED ORDER — LEVOFLOXACIN 500 MG PO TABS: 500.0000 mg | ORAL_TABLET | Freq: Every day | ORAL | 0 refills | Status: AC

## 2023-05-03 ENCOUNTER — Ambulatory Visit (INDEPENDENT_AMBULATORY_CARE_PROVIDER_SITE_OTHER): Admitting: Physician Assistant

## 2023-05-03 ENCOUNTER — Encounter: Payer: Self-pay | Admitting: Physician Assistant

## 2023-05-03 VITALS — BP 102/68 | HR 73 | Temp 97.7°F | Ht 62.0 in | Wt 191.4 lb

## 2023-05-03 DIAGNOSIS — J4540 Moderate persistent asthma, uncomplicated: Secondary | ICD-10-CM

## 2023-05-03 DIAGNOSIS — I5032 Chronic diastolic (congestive) heart failure: Secondary | ICD-10-CM | POA: Diagnosis not present

## 2023-05-03 DIAGNOSIS — J4531 Mild persistent asthma with (acute) exacerbation: Secondary | ICD-10-CM

## 2023-05-03 DIAGNOSIS — R051 Acute cough: Secondary | ICD-10-CM | POA: Diagnosis not present

## 2023-05-03 DIAGNOSIS — I110 Hypertensive heart disease with heart failure: Secondary | ICD-10-CM

## 2023-05-03 MED ORDER — ALBUTEROL SULFATE HFA 108 (90 BASE) MCG/ACT IN AERS: 2.0000 | INHALATION_SPRAY | Freq: Four times a day (QID) | RESPIRATORY_TRACT | 3 refills | Status: AC | PRN

## 2023-05-03 MED ORDER — FUROSEMIDE 20 MG PO TABS
20.0000 mg | ORAL_TABLET | Freq: Every day | ORAL | 3 refills | Status: AC
Start: 2023-05-03 — End: ?

## 2023-05-03 MED ORDER — LORATADINE 10 MG PO TABS: 10.0000 mg | ORAL_TABLET | Freq: Every day | ORAL | 11 refills | Status: DC

## 2023-05-03 NOTE — Assessment & Plan Note (Signed)
 Refills needed for furosemide and albuterol. New prescription for Claritin required. - Prescribe furosemide 20 mg for 90 days with three refills. - Prescribe two albuterol inhalers with three refills. - Send prescription for Claritin to [facility] on [address].

## 2023-05-03 NOTE — Assessment & Plan Note (Signed)
 Blood pressure well-controlled. Heart rate in 70s, target 60-100 bpm. New prescription for 180 mg to start next week. - Continue current antihypertensive regimen. - Start new prescription for 180 mg next week.

## 2023-05-03 NOTE — Patient Instructions (Signed)
 VISIT SUMMARY:  You came in today for a follow-up visit regarding your cough and sinus congestion. You reported that your cough has significantly improved, although you still experience some coughing spells. You also mentioned having a head cold with nasal discharge and a feeling of your ear being stocked up. We discussed your current medications and made some adjustments to better manage your symptoms.  YOUR PLAN:  -COUGH AND SINUS CONGESTION: Your symptoms have improved, likely due to allergies. We believe the recent course of Levaquin helped address any bacterial component. We are switching you to Claritin as it may be more effective for you than Singulair. Continue using Flonase and doing daily sinus rinses. Consider adding Activia yogurt and continuing probiotics to support your gut health after antibiotics.  -HYPERTENSION: Your blood pressure is well-controlled, and your heart rate is within the target range. We will continue your current antihypertensive regimen and start you on a new prescription for 180 mg next week.  -MEDICATION MANAGEMENT: We have provided refills for your furosemide and albuterol inhaler. A new prescription for Claritin has been sent to [facility] on [address].  -GENERAL HEALTH MAINTENANCE: You are due for blood work, including an A1c test, and a Prolia shot after April 24th. These are routine tests to monitor your overall health.  INSTRUCTIONS:  Please schedule your blood work, including the A1c test, before your next appointment. Also, schedule your Prolia shot after April 24th. Make sure to align your follow-up appointments and blood work with your specialist visits. Your next follow-up appointment with Doctor Cox should be scheduled for July, and your blood work should be done within a week of that appointment.

## 2023-05-03 NOTE — Progress Notes (Signed)
 Subjective:  Patient ID: Diana Hendrix, female    DOB: Jul 24, 1945  Age: 78 y.o. MRN: [id number]  Chief Complaint  Patient presents with   Nasal Congestion    Follow up   Discussed the use of AI scribe software for clinical note transcription with the patient, who gave verbal consent to proceed.  History of Present Illness   The patient, with a history of respiratory issues, presents for a follow-up visit regarding her cough. She reports significant improvement in her cough, describing it as "not as bad" and "not as deep." However, she still experiences coughing spells three to four times a day. Alongside the cough, the patient has been dealing with what she describes as a "head cold." She has been experiencing nasal discharge, specifically noting that she has been blowing yellow stuff out of one side of her nose. She also mentions a feeling of her ear being "stocked up." The patient has been using Flonase and Singulair for an extended period, and she also uses an albuterol inhaler and takes furosemide.            05/03/2023    8:51 AM 03/20/2023    4:15 PM 12/20/2022    8:36 AM 08/25/2022    8:39 AM 05/05/2022   10:03 AM  Depression screen PHQ 2/9  Decreased Interest 0 0 0 0 0  Down, Depressed, Hopeless 0 0 0 0 0  PHQ - 2 Score 0 0 0 0 0  Altered sleeping 0 0 0 3 0  Tired, decreased energy 0 0 0 0 0  Change in appetite 0 0 0 0 0  Feeling bad or failure about yourself  0 0 0 0 0  Trouble concentrating 0 0 0 0 0  Moving slowly or fidgety/restless 0 0 0 0 0  Suicidal thoughts 0 0 0 0 0  PHQ-9 Score 0 0 0 3 0  Difficult doing work/chores Not difficult at all Not difficult at all Not difficult at all Somewhat difficult Not difficult at all        05/03/2023    8:51 AM  Fall Risk   Falls in the past year? 0  Number falls in past yr: 0  Injury with Fall? 0  Risk for fall due to : No Fall Risks    Patient Care Team: Blane Ohara, MD as PCP - General (Family Medicine) Sinda Du, MD as Consulting Physician (Ophthalmology) Dulce Sellar Iline Oven, MD as Consulting Physician (Cardiology) Lynann Bologna, MD as Consulting Physician (Gastroenterology) Hunsucker, Lesia Sago, MD as Consulting Physician (Pulmonary Disease)   Review of Systems  Constitutional:  Negative for chills, fatigue and fever.  HENT:  Negative for congestion, ear pain and sore throat.   Respiratory:  Negative for cough and shortness of breath.   Cardiovascular:  Negative for chest pain.  Gastrointestinal:  Negative for abdominal pain, constipation, diarrhea, nausea and vomiting.  Genitourinary:  Negative for dysuria and frequency.  Musculoskeletal:  Negative for arthralgias and myalgias.  Neurological:  Negative for dizziness and headaches.  Psychiatric/Behavioral:  Negative for dysphoric mood. The patient is not nervous/anxious.     Current Outpatient Medications on File Prior to Visit  Medication Sig Dispense Refill   Cholecalciferol (VITAMIN D3) 50 MCG (2000 UT) TABS Take 1 tablet by mouth daily.     citalopram (CELEXA) 10 MG tablet TAKE 1 TABLET(10 MG) BY MOUTH DAILY 90 tablet 1   diltiazem (CARDIZEM LA) 180 MG 24 hr tablet Take 1 tablet (180 mg  total) by mouth daily. 30 tablet 0   fluticasone (FLONASE) 50 MCG/ACT nasal spray Place 2 sprays into both nostrils daily. 16 g 6   NON FORMULARY CPAP at bedtime     olmesartan (BENICAR) 20 MG tablet Take 1 tablet (20 mg total) by mouth daily. 90 tablet 3   omega-3 acid ethyl esters (LOVAZA) 1 g capsule TAKE 2 CAPSULES BY MOUTH TWICE DAILY 360 capsule 2   omeprazole (PRILOSEC) 40 MG capsule TAKE 1 CAPSULE(40 MG) BY MOUTH IN THE MORNING AND AT BEDTIME 180 capsule 0   Polyethyl Glycol-Propyl Glycol (SYSTANE OP) Place 1 drop into both eyes 4 (four) times daily.     polyethylene glycol (MIRALAX / GLYCOLAX) 17 g packet Take 17 g by mouth daily.     rosuvastatin (CRESTOR) 40 MG tablet TAKE 1 TABLET(40 MG) BY MOUTH DAILY 90 tablet 1   Tiotropium  Bromide-Olodaterol (STIOLTO RESPIMAT) 2.5-2.5 MCG/ACT AERS Inhale 2 puffs into the lungs daily. 1 each 11   zolpidem (AMBIEN) 5 MG tablet Take 1 tablet (5 mg total) by mouth at bedtime as needed for sleep. 30 tablet 5   Current Facility-Administered Medications on File Prior to Visit  Medication Dose Route Frequency Provider Last Rate Last Admin   [START ON 05/10/2023] denosumab (PROLIA) injection 60 mg  60 mg Subcutaneous Q6 months Cox, Kirsten, MD       Past Medical History:  Diagnosis Date   Cancer [facility])    Diabetes mellitus without complication (HCC)    Diastolic heart failure (HCC)    Echo EF 60/65% Heart monitor was normal   GERD (gastroesophageal reflux disease)    History of colon polyps    Hyperlipidemia    Hypertension    Macular edema, cystoid    OSA (obstructive sleep apnea) 09/17/2012   Osteopenia    Plantar fasciitis 09/09/2019   Past Surgical History:  Procedure Laterality Date   CESAREAN SECTION     COLONOSCOPY  10/29/2013   Colonic polyp status post polypectomy. Mild sigmoid diverticulosis. Small internal hemorrhoids   FOOT NEUROMA SURGERY     s/p surgery in both feet   FOOT SURGERY Bilateral    Per patient   MENISCUS REPAIR Left 09/13/2016   miniscus repair right  Right 2019   SHOULDER SURGERY Left    arthroscopy.    TUBAL LIGATION      Family History  Problem Relation Age of Onset   Hyperlipidemia Mother    Heart disease Mother    Hypertension Mother    Atrial fibrillation Mother    CAD Mother    Congestive Heart Failure Mother    CVA Father    Hypertension Brother    Hypertension Son    Colon cancer Neg Hx    Esophageal cancer Neg Hx    Social History   Socioeconomic History   Marital status: Married    Spouse name: Engineer, production   Number of children: 2   Years of education: Not on file   Highest education level: Associate degree: occupational, Scientist, product/process development, or vocational program  Occupational History   Not on file  Tobacco Use   Smoking  status: Never   Smokeless tobacco: Never  Vaping Use   Vaping status: Never Used  Substance and Sexual Activity   Alcohol use: No   Drug use: No   Sexual activity: Not Currently  Other Topics Concern   Not on file  Social History Narrative   Lives with husband   Right handed  Caffeine: 1 cup of tea and 1 pepsi in the AM   Social Drivers of Health   Financial Resource Strain: Low Risk  (04/18/2023)   Overall Financial Resource Strain (CARDIA)    Difficulty of Paying Living Expenses: Not hard at all  Food Insecurity: No Food Insecurity (04/18/2023)   Hunger Vital Sign    Worried About Running Out of Food in the Last Year: Never true    Ran Out of Food in the Last Year: Never true  Transportation Needs: No Transportation Needs (04/18/2023)   PRAPARE - Administrator, Civil Service (Medical): No    Lack of Transportation (Non-Medical): No  Physical Activity: Sufficiently Active (04/18/2023)   Exercise Vital Sign    Days of Exercise per Week: 3 days    Minutes of Exercise per Session: 60 min  Stress: No Stress Concern Present (04/18/2023)   [employer] of Occupational Health - Occupational Stress Questionnaire    Feeling of Stress : Not at all  Social Connections: Unknown (04/18/2023)   Social Connection and Isolation Panel [NHANES]    Frequency of Communication with Friends and Family: More than three times a week    Frequency of Social Gatherings with Friends and Family: Patient declined    Attends Religious Services: Patient declined    Database administrator or Organizations: No    Attends Engineer, structural: Not on file    Marital Status: Married    Objective:  BP 102/68   Pulse 73   Temp 97.7 F (36.5 C)   Ht 5\' 2"  (1.575 m)   Wt 191 lb 6.4 oz (86.8 kg)   SpO2 97%   BMI 35.01 kg/m      05/03/2023    8:47 AM 04/23/2023    1:08 PM 04/18/2023    2:47 PM  BP/Weight  Systolic BP 102 118 118  Diastolic BP 68 62 62  Wt. (Lbs) 191.4 191 189  BMI  35.01 kg/m2 34.93 kg/m2 34.57 kg/m2    Physical Exam Vitals reviewed.  Constitutional:      Appearance: Normal appearance.  HENT:     Right Ear: Hearing and ear canal normal. No decreased hearing noted. Swelling present. Tympanic membrane is bulging.     Left Ear: Hearing and ear canal normal. No decreased hearing noted. No swelling.  No middle ear effusion. Tympanic membrane is not bulging.  Cardiovascular:     Rate and Rhythm: Normal rate and regular rhythm.     Heart sounds: Normal heart sounds.  Pulmonary:     Effort: Pulmonary effort is normal.     Breath sounds: Normal breath sounds.  Abdominal:     General: Bowel sounds are normal.     Palpations: Abdomen is soft.     Tenderness: There is no abdominal tenderness.  Neurological:     Mental Status: She is alert and oriented to person, place, and time.  Psychiatric:        Mood and Affect: Mood normal.        Behavior: Behavior normal.     Diabetic Foot Exam - Simple   No data filed      Lab Results  Component Value Date   WBC 6.8 04/05/2023   HGB 13.8 04/05/2023   HCT 42.7 04/05/2023   PLT 211 04/05/2023   GLUCOSE 97 04/05/2023   CHOL 143 04/05/2023   TRIG 141 04/05/2023   HDL 51 04/05/2023   LDLCALC 68 04/05/2023   ALT 20 04/05/2023  AST 27 04/05/2023   NA 137 04/05/2023   K 4.4 04/05/2023   CL 101 04/05/2023   CREATININE 1.14 (H) 04/05/2023   BUN 15 04/05/2023   CO2 22 04/05/2023   TSH 1.810 02/09/2021   INR 1.1 11/09/2021   HGBA1C 6.3 (H) 12/20/2022      Assessment & Plan:   Mild persistent extrinsic asthma with acute exacerbation Assessment & Plan: Refills needed for furosemide and albuterol. New prescription for Claritin required. - Prescribe furosemide 20 mg for 90 days with three refills. - Prescribe two albuterol inhalers with three refills. - Send prescription for Claritin to [facility] on [address].  Orders: -     Loratadine; Take 1 tablet (10 mg total) by mouth daily.  Dispense: 30  tablet; Refill: 11  Hypertensive heart disease with chronic diastolic congestive heart failure (HCC) Assessment & Plan: Blood pressure well-controlled. Heart rate in 70s, target 60-100 bpm. New prescription for 180 mg to start next week. - Continue current antihypertensive regimen. - Start new prescription for 180 mg next week.  Orders: -     Furosemide; Take 1 tablet (20 mg total) by mouth daily. Patient needs to make an appointment for further refills. 1st attempt.  Dispense: 90 tablet; Refill: 3  Asthma, moderate persistent, poorly-controlled Assessment & Plan: Refills needed for furosemide and albuterol. New prescription for Claritin required. - Prescribe furosemide 20 mg for 90 days with three refills. - Prescribe two albuterol inhalers with three refills. - Send prescription for Claritin to [facility] on [address].  Orders: -     Albuterol Sulfate HFA; Inhale 2 puffs into the lungs every 6 (six) hours as needed for wheezing or shortness of breath.  Dispense: 16 g; Refill: 3  Acute cough Assessment & Plan: Symptoms improved, likely due to allergies. Recent Levaquin course addressed bacterial component. Switching to Claritin due to potential reduced effectiveness of Singulair. - Continue Flonase. - Switch to Claritin. - Continue daily sinus rinse. - Consider Activia yogurt post-antibiotics. - Continue probiotics.      Meds ordered this encounter  Medications   loratadine (CLARITIN) 10 MG tablet    Sig: Take 1 tablet (10 mg total) by mouth daily.    Dispense:  30 tablet    Refill:  11   furosemide (LASIX) 20 MG tablet    Sig: Take 1 tablet (20 mg total) by mouth daily. Patient needs to make an appointment for further refills. 1st attempt.    Dispense:  90 tablet    Refill:  3   albuterol (VENTOLIN HFA) 108 (90 Base) MCG/ACT inhaler    Sig: Inhale 2 puffs into the lungs every 6 (six) hours as needed for wheezing or shortness of breath.    Dispense:  16 g    Refill:  3     No orders of the defined types were placed in this encounter.   General Health Maintenance Due for blood work including A1c and Prolia shot after April 24th. - Schedule blood work including A1c before next appointment. - Schedule Prolia shot after April 24th.  Follow-up Align follow-up appointments and blood work with specialist visits. - Schedule follow-up appointment with Doctor Cox in July. - Schedule blood work within a week of the appointment with Doctor Cox.      Follow-up: Return in about 3 months (around 08/02/2023), or if symptoms worsen or fail to improve, for Chronic, Dr. Blima Bureau.   I,Diana Hendrix,acting as a Neurosurgeon for [employer], PA.,have documented all relevant documentation  on the behalf of Diana Bennett, PA,as directed by  Diana Bennett, PA while in the presence of Diana Hendrix, [place].   An After Visit Summary was printed and given to the patient.  Diana Hendrix, [place] [facility] [phone]

## 2023-05-03 NOTE — Assessment & Plan Note (Signed)
 Symptoms improved, likely due to allergies. Recent Levaquin course addressed bacterial component. Switching to Claritin due to potential reduced effectiveness of Singulair. - Continue Flonase. - Switch to Claritin. - Continue daily sinus rinse. - Consider Activia yogurt post-antibiotics. - Continue probiotics.

## 2023-05-05 ENCOUNTER — Other Ambulatory Visit: Payer: Self-pay | Admitting: Cardiology

## 2023-05-08 ENCOUNTER — Other Ambulatory Visit: Payer: Self-pay

## 2023-05-08 MED ORDER — OLMESARTAN MEDOXOMIL 20 MG PO TABS
20.0000 mg | ORAL_TABLET | Freq: Every day | ORAL | 3 refills | Status: DC
Start: 2023-05-08 — End: 2023-08-06

## 2023-05-10 ENCOUNTER — Ambulatory Visit (INDEPENDENT_AMBULATORY_CARE_PROVIDER_SITE_OTHER)

## 2023-05-10 DIAGNOSIS — M810 Age-related osteoporosis without current pathological fracture: Secondary | ICD-10-CM

## 2023-05-10 MED ORDER — DENOSUMAB 60 MG/ML ~~LOC~~ SOSY
60.0000 mg | PREFILLED_SYRINGE | Freq: Once | SUBCUTANEOUS | Status: AC
Start: 2023-11-09 — End: 2023-11-13
  Administered 2023-11-13: 60 mg via SUBCUTANEOUS

## 2023-05-10 NOTE — Progress Notes (Signed)
   Patient: Diana Hendrix  DOB: [birth year]  MRN: [id number]    Visit Date: 05/10/2023    Danna Duster Glomb presents today for her six month Prolia  injection.  1 x 60 mg Single-Dose Prefilled Syringe was given SQ in the left arm.  Patient tolerated the injection well and has no questions.  The next Prolia  injection will be due in six months.      Allana Ishikawa, CMA

## 2023-05-21 NOTE — Progress Notes (Unsigned)
 Acute Office Visit  Subjective:    Patient ID: Diana Hendrix, female    DOB: Apr 15, 1945, 78 y.o.   MRN: [id number]  No chief complaint on file.   HPI: Patient is in today for ***  Past Medical History:  Diagnosis Date   Cancer (HCC)    Diabetes mellitus without complication (HCC)    Diastolic heart failure (HCC)    Echo EF 60/65% Heart monitor was normal   GERD (gastroesophageal reflux disease)    History of colon polyps    Hyperlipidemia    Hypertension    Macular edema, cystoid    OSA (obstructive sleep apnea) 09/17/2012   Osteopenia    Plantar fasciitis 09/09/2019    Past Surgical History:  Procedure Laterality Date   CESAREAN SECTION     COLONOSCOPY  10/29/2013   Colonic polyp status post polypectomy. Mild sigmoid diverticulosis. Small internal hemorrhoids   FOOT NEUROMA SURGERY     s/p surgery in both feet   FOOT SURGERY Bilateral    Per patient   MENISCUS REPAIR Left 09/13/2016   miniscus repair right  Right 2019   SHOULDER SURGERY Left    arthroscopy.    TUBAL LIGATION      Family History  Problem Relation Age of Onset   Hyperlipidemia Mother    Heart disease Mother    Hypertension Mother    Atrial fibrillation Mother    CAD Mother    Congestive Heart Failure Mother    CVA Father    Hypertension Brother    Hypertension Son    Colon cancer Neg Hx    Esophageal cancer Neg Hx     Social History   Socioeconomic History   Marital status: Married    Spouse name: Engineer, production   Number of children: 2   Years of education: Not on file   Highest education level: Associate degree: occupational, Scientist, product/process development, or vocational program  Occupational History   Not on file  Tobacco Use   Smoking status: Never   Smokeless tobacco: Never  Vaping Use   Vaping status: Never Used  Substance and Sexual Activity   Alcohol use: No   Drug use: No   Sexual activity: Not Currently  Other Topics Concern   Not on file  Social History Narrative   Lives with  husband   Right handed   Caffeine: 1 cup of tea and 1 pepsi in the AM   Social Drivers of Health   Financial Resource Strain: Low Risk  (04/18/2023)   Overall Financial Resource Strain (CARDIA)    Difficulty of Paying Living Expenses: Not hard at all  Food Insecurity: No Food Insecurity (04/18/2023)   Hunger Vital Sign    Worried About Running Out of Food in the Last Year: Never true    Ran Out of Food in the Last Year: Never true  Transportation Needs: No Transportation Needs (04/18/2023)   PRAPARE - Administrator, Civil Service (Medical): No    Lack of Transportation (Non-Medical): No  Physical Activity: Sufficiently Active (04/18/2023)   Exercise Vital Sign    Days of Exercise per Week: 3 days    Minutes of Exercise per Session: 60 min  Stress: No Stress Concern Present (04/18/2023)   [employer] of Occupational Health - Occupational Stress Questionnaire    Feeling of Stress : Not at all  Social Connections: Unknown (04/18/2023)   Social Connection and Isolation Panel [NHANES]    Frequency of Communication with Friends and  Family: More than three times a week    Frequency of Social Gatherings with Friends and Family: Patient declined    Attends Religious Services: Patient declined    Active Member of Clubs or Organizations: No    Attends Engineer, structural: Not on file    Marital Status: Married  Catering manager Violence: Not At Risk (11/10/2021)   Humiliation, Afraid, Rape, and Kick questionnaire    Fear of Current or Ex-Partner: No    Emotionally Abused: No    Physically Abused: No    Sexually Abused: No    Outpatient Medications Prior to Visit  Medication Sig Dispense Refill   albuterol  (VENTOLIN  HFA) 108 (90 Base) MCG/ACT inhaler Inhale 2 puffs into the lungs every 6 (six) hours as needed for wheezing or shortness of breath. 16 g 3   Cholecalciferol (VITAMIN D3) 50 MCG (2000 UT) TABS Take 1 tablet by mouth daily.     citalopram  (CELEXA ) 10 MG  tablet TAKE 1 TABLET(10 MG) BY MOUTH DAILY 90 tablet 1   diltiazem  (CARDIZEM  LA) 180 MG 24 hr tablet Take 1 tablet (180 mg total) by mouth daily. 30 tablet 0   fluticasone  (FLONASE ) 50 MCG/ACT nasal spray Place 2 sprays into both nostrils daily. 16 g 6   furosemide  (LASIX ) 20 MG tablet Take 1 tablet (20 mg total) by mouth daily. Patient needs to make an appointment for further refills. 1st attempt. 90 tablet 3   loratadine  (CLARITIN ) 10 MG tablet Take 1 tablet (10 mg total) by mouth daily. 30 tablet 11   NON FORMULARY CPAP at bedtime     olmesartan  (BENICAR ) 20 MG tablet Take 1 tablet (20 mg total) by mouth daily. 90 tablet 3   omega-3 acid ethyl esters (LOVAZA ) 1 g capsule TAKE 2 CAPSULES BY MOUTH TWICE DAILY 360 capsule 2   omeprazole  (PRILOSEC) 40 MG capsule TAKE 1 CAPSULE(40 MG) BY MOUTH IN THE MORNING AND AT BEDTIME 180 capsule 0   Polyethyl Glycol-Propyl Glycol (SYSTANE OP) Place 1 drop into both eyes 4 (four) times daily.     polyethylene glycol (MIRALAX  / GLYCOLAX ) 17 g packet Take 17 g by mouth daily.     rosuvastatin  (CRESTOR ) 40 MG tablet TAKE 1 TABLET(40 MG) BY MOUTH DAILY 90 tablet 1   Tiotropium Bromide-Olodaterol (STIOLTO RESPIMAT ) 2.5-2.5 MCG/ACT AERS Inhale 2 puffs into the lungs daily. 1 each 11   zolpidem  (AMBIEN ) 5 MG tablet Take 1 tablet (5 mg total) by mouth at bedtime as needed for sleep. 30 tablet 5   Facility-Administered Medications Prior to Visit  Medication Dose Route Frequency Provider Last Rate Last Admin   [START ON 11/09/2023] denosumab  (PROLIA ) injection 60 mg  60 mg Subcutaneous Once Herberth Deharo, Burleigh Carp, MD        Allergies  Allergen Reactions   Cortisone Other (See Comments)    Passed out according to patient can take prednisone    Glucosamine Other (See Comments)   Zyrtec [Cetirizine]     Weird dreams.    Ibuprofen Other (See Comments)    Pt states Dr doesn't wont her to take it; not allergic    Review of Systems     Objective:        05/03/2023     8:47 AM 04/23/2023    1:08 PM 04/18/2023    2:47 PM  Vitals with BMI  Height 5\' 2"  5\' 2"  5\' 2"   Weight 191 lbs 6 oz 191 lbs 189 lbs  BMI 35 34.93 34.56  Systolic  102 118 118  Diastolic 68 62 62  Pulse 73 85 105    No data found.   Physical Exam  Health Maintenance Due  Topic Date Due   Medicare Annual Wellness (AWV)  01/24/2023   Diabetic kidney evaluation - Urine ACR  02/02/2023   OPHTHALMOLOGY EXAM  04/05/2023    There are no preventive care reminders to display for this patient.   Lab Results  Component Value Date   TSH 1.810 02/09/2021   Lab Results  Component Value Date   WBC 6.8 04/05/2023   HGB 13.8 04/05/2023   HCT 42.7 04/05/2023   MCV 91 04/05/2023   PLT 211 04/05/2023   Lab Results  Component Value Date   NA 137 04/05/2023   K 4.4 04/05/2023   CO2 22 04/05/2023   GLUCOSE 97 04/05/2023   BUN 15 04/05/2023   CREATININE 1.14 (H) 04/05/2023   BILITOT 0.6 04/05/2023   ALKPHOS 63 04/05/2023   AST 27 04/05/2023   ALT 20 04/05/2023   PROT 7.0 04/05/2023   ALBUMIN 4.4 04/05/2023   CALCIUM  9.3 04/05/2023   ANIONGAP 8 11/13/2021   EGFR 50 (L) 04/05/2023   GFR 55.96 (L) 11/25/2019   Lab Results  Component Value Date   CHOL 143 04/05/2023   Lab Results  Component Value Date   HDL 51 04/05/2023   Lab Results  Component Value Date   LDLCALC 68 04/05/2023   Lab Results  Component Value Date   TRIG 141 04/05/2023   Lab Results  Component Value Date   CHOLHDL 2.8 04/05/2023   Lab Results  Component Value Date   HGBA1C 6.3 (H) 12/20/2022       Assessment & Plan:  There are no diagnoses linked to this encounter.   No orders of the defined types were placed in this encounter.   No orders of the defined types were placed in this encounter.    Follow-up: No follow-ups on file.  An After Visit Summary was printed and given to the patient.  Mercy Stall, MD [facility] [phone]

## 2023-05-22 ENCOUNTER — Ambulatory Visit (INDEPENDENT_AMBULATORY_CARE_PROVIDER_SITE_OTHER): Admitting: Family Medicine

## 2023-05-22 ENCOUNTER — Encounter: Payer: Self-pay | Admitting: Family Medicine

## 2023-05-22 ENCOUNTER — Ambulatory Visit (HOSPITAL_BASED_OUTPATIENT_CLINIC_OR_DEPARTMENT_OTHER)
Admission: RE | Admit: 2023-05-22 | Discharge: 2023-05-22 | Disposition: A | Discharge status: Home / Self Care | Source: Ambulatory Visit | Attending: Family Medicine | Admitting: Family Medicine

## 2023-05-22 VITALS — BP 136/76 | HR 78 | Temp 98.1°F | Ht 62.0 in | Wt 194.0 lb

## 2023-05-22 DIAGNOSIS — R0600 Dyspnea, unspecified: Secondary | ICD-10-CM | POA: Diagnosis not present

## 2023-05-22 DIAGNOSIS — R053 Chronic cough: Secondary | ICD-10-CM

## 2023-05-22 DIAGNOSIS — J479 Bronchiectasis, uncomplicated: Secondary | ICD-10-CM | POA: Diagnosis not present

## 2023-05-22 DIAGNOSIS — R9389 Abnormal findings on diagnostic imaging of other specified body structures: Secondary | ICD-10-CM | POA: Diagnosis not present

## 2023-05-22 DIAGNOSIS — R0989 Other specified symptoms and signs involving the circulatory and respiratory systems: Secondary | ICD-10-CM | POA: Insufficient documentation

## 2023-05-22 NOTE — Assessment & Plan Note (Signed)
 Ordering stat ct scan of chest.

## 2023-05-22 NOTE — Assessment & Plan Note (Signed)
 Ordering stat ct scan of chest.  Determine further treatment based on results.

## 2023-05-23 ENCOUNTER — Encounter: Payer: Self-pay | Admitting: Family Medicine

## 2023-05-23 ENCOUNTER — Ambulatory Visit (INDEPENDENT_AMBULATORY_CARE_PROVIDER_SITE_OTHER): Admitting: Family Medicine

## 2023-05-23 VITALS — BP 120/72 | HR 82

## 2023-05-23 DIAGNOSIS — Z0000 Encounter for general adult medical examination without abnormal findings: Secondary | ICD-10-CM | POA: Diagnosis not present

## 2023-05-23 NOTE — Progress Notes (Signed)
 Subjective:   Lishia Clendennen Colella is a 78 y.o. female who presents for Medicare Annual (Subsequent) preventive examination.  Visit Complete: Virtual I connected with  Ayeshia Schicker Navarro on 05/23/23 by a audio enabled telemedicine application and verified that I am speaking with the correct person using two identifiers.  Patient Location: Home  Provider Location: Office/Clinic  I discussed the limitations of evaluation and management by telemedicine. The patient expressed understanding and agreed to proceed.  Vital Signs: Because this visit was a virtual/telehealth visit, some criteria may be missing or patient reported. Any vitals not documented were not able to be obtained and vitals that have been documented are patient reported.  Patient Medicare AWV questionnaire was completed by the patient on 05/23/2023; I have confirmed that all information answered by patient is correct and no changes since this date.  Cardiac Risk Factors include: advanced age (>52men, >26 women);diabetes mellitus;hypertension;obesity (BMI >30kg/m2)     Objective:    Today's Vitals   05/23/23 0856  BP: 120/72  Pulse: 82   There is no height or weight on file to calculate BMI.     02/21/2022    3:44 PM 11/10/2021    5:34 PM 11/09/2021    7:40 PM 01/08/2020    8:57 AM 05/14/2018    1:24 PM 04/17/2017   12:00 AM  Advanced Directives  Does Patient Have a Medical Advance Directive? Yes  No Yes Yes Yes  Type of Merchandiser, retail of [place];Living will Healthcare Power of Attorney  Does patient want to make changes to medical advance directive?      No - Patient declined  Copy of Healthcare Power of Attorney in Chart?     No - copy requested No - copy requested  Would patient like information on creating a medical advance directive?  No - Patient declined        Current Medications (verified) Outpatient Encounter Medications as of 05/23/2023  Medication Sig   albuterol  (VENTOLIN  HFA) 108  (90 Base) MCG/ACT inhaler Inhale 2 puffs into the lungs every 6 (six) hours as needed for wheezing or shortness of breath.   Cholecalciferol (VITAMIN D3) 50 MCG (2000 UT) TABS Take 1 tablet by mouth daily.   citalopram  (CELEXA ) 10 MG tablet TAKE 1 TABLET(10 MG) BY MOUTH DAILY   diltiazem  (CARDIZEM  LA) 180 MG 24 hr tablet Take 1 tablet (180 mg total) by mouth daily.   fluticasone  (FLONASE ) 50 MCG/ACT nasal spray Place 2 sprays into both nostrils daily.   furosemide  (LASIX ) 20 MG tablet Take 1 tablet (20 mg total) by mouth daily. Patient needs to make an appointment for further refills. 1st attempt.   loratadine  (CLARITIN ) 10 MG tablet Take 1 tablet (10 mg total) by mouth daily.   NON FORMULARY CPAP at bedtime   olmesartan  (BENICAR ) 20 MG tablet Take 1 tablet (20 mg total) by mouth daily.   omega-3 acid ethyl esters (LOVAZA ) 1 g capsule TAKE 2 CAPSULES BY MOUTH TWICE DAILY   omeprazole  (PRILOSEC) 40 MG capsule TAKE 1 CAPSULE(40 MG) BY MOUTH IN THE MORNING AND AT BEDTIME   Polyethyl Glycol-Propyl Glycol (SYSTANE OP) Place 1 drop into both eyes 4 (four) times daily.   polyethylene glycol (MIRALAX  / GLYCOLAX ) 17 g packet Take 17 g by mouth daily.   rosuvastatin  (CRESTOR ) 40 MG tablet TAKE 1 TABLET(40 MG) BY MOUTH DAILY   Tiotropium Bromide-Olodaterol (STIOLTO RESPIMAT ) 2.5-2.5 MCG/ACT AERS Inhale 2 puffs into the lungs daily.  zolpidem  (AMBIEN ) 5 MG tablet Take 1 tablet (5 mg total) by mouth at bedtime as needed for sleep.   Facility-Administered Encounter Medications as of 05/23/2023  Medication   [START ON 11/09/2023] denosumab  (PROLIA ) injection 60 mg    Allergies (verified) Cortisone, Glucosamine, Zyrtec [cetirizine], and Ibuprofen   History: Past Medical History:  Diagnosis Date   Cancer (HCC)    Diabetes mellitus without complication (HCC)    Diastolic heart failure (HCC)    Echo EF 60/65% Heart monitor was normal   GERD (gastroesophageal reflux disease)    History of colon polyps     Hyperlipidemia    Hypertension    Macular edema, cystoid    OSA (obstructive sleep apnea) 09/17/2012   Osteopenia    Plantar fasciitis 09/09/2019   Sleep apnea    Past Surgical History:  Procedure Laterality Date   CESAREAN SECTION     COLONOSCOPY  10/29/2013   Colonic polyp status post polypectomy. Mild sigmoid diverticulosis. Small internal hemorrhoids   FOOT NEUROMA SURGERY     s/p surgery in both feet   FOOT SURGERY Bilateral    Per patient   MENISCUS REPAIR Left 09/13/2016   miniscus repair right  Right 2019   SHOULDER SURGERY Left    arthroscopy.    TUBAL LIGATION     Family History  Problem Relation Age of Onset   Hyperlipidemia Mother    Heart disease Mother    Hypertension Mother    Atrial fibrillation Mother    CAD Mother    Congestive Heart Failure Mother    CVA Father    Hypertension Brother    Hypertension Son    Colon cancer Neg Hx    Esophageal cancer Neg Hx    Social History   Socioeconomic History   Marital status: Married    Spouse name: Engineer, production   Number of children: 2   Years of education: Not on file   Highest education level: Associate degree: occupational, Scientist, product/process development, or vocational program  Occupational History   Not on file  Tobacco Use   Smoking status: Never   Smokeless tobacco: Never  Vaping Use   Vaping status: Never Used  Substance and Sexual Activity   Alcohol use: No   Drug use: No   Sexual activity: Not Currently  Other Topics Concern   Not on file  Social History Narrative   Lives with husband   Right handed   Caffeine: 1 cup of tea and 1 pepsi in the AM   Social Drivers of Health   Financial Resource Strain: Low Risk  (04/18/2023)   Overall Financial Resource Strain (CARDIA)    Difficulty of Paying Living Expenses: Not hard at all  Food Insecurity: No Food Insecurity (04/18/2023)   Hunger Vital Sign    Worried About Running Out of Food in the Last Year: Never true    Ran Out of Food in the Last Year: Never true   Transportation Needs: No Transportation Needs (04/18/2023)   PRAPARE - Administrator, Civil Service (Medical): No    Lack of Transportation (Non-Medical): No  Physical Activity: Sufficiently Active (04/18/2023)   Exercise Vital Sign    Days of Exercise per Week: 3 days    Minutes of Exercise per Session: 60 min  Stress: No Stress Concern Present (04/18/2023)   [employer] of Occupational Health - Occupational Stress Questionnaire    Feeling of Stress : Not at all  Social Connections: Unknown (04/18/2023)   Social  Connection and Isolation Panel [NHANES]    Frequency of Communication with Friends and Family: More than three times a week    Frequency of Social Gatherings with Friends and Family: Patient declined    Attends Religious Services: Patient declined    Database administrator or Organizations: No    Attends Engineer, structural: Not on file    Marital Status: Married    Tobacco Counseling Counseling given: Not Answered   Clinical Intake:  Pre-visit preparation completed: Yes  Pain : No/denies pain     Diabetes: Yes CBG done?: No Did pt. bring in CBG monitor from home?: No  How often do you need to have someone help you when you read instructions, pamphlets, or other written materials from your doctor or pharmacy?: 1 - Never What is the last grade level you completed in school?: 12th and 1 yr college  Interpreter Needed?: No      Activities of Daily Living    05/23/2023    9:00 AM 05/19/2023   11:30 AM  In your present state of health, do you have any difficulty performing the following activities:  Hearing? 0 0  Vision? 1 1  Difficulty concentrating or making decisions? 0 0  Walking or climbing stairs? 0 0  Dressing or bathing? 0 0  Doing errands, shopping? 0 0  Preparing Food and eating ? N N  Using the Toilet? N N  In the past six months, have you accidently leaked urine? N N  Do you have problems with loss of bowel control? N N   Managing your Medications? N N  Managing your Finances? N N  Housekeeping or managing your Housekeeping? N N    Patient Care Team: Mercy Stall, MD as PCP - General (Family Medicine) Cindra Cree, MD as Consulting Physician (Ophthalmology) Hassan Links, MD as Consulting Physician (Cardiology) Lajuan Pila, MD as Consulting Physician (Gastroenterology) Hunsucker, Archer Kobs, MD as Consulting Physician (Pulmonary Disease)  Indicate any recent Medical Services you may have received from other than [facility] providers in the past year (date may be approximate).     Assessment:   This is a routine wellness examination for [place].  Hearing/Vision screen No results found.   Goals Addressed             This Visit's Progress    Learn and Do Breathing Exercises-Asthma       Follow Up Date 05/23/2023    - do breathing exercises every day    Why is this important?  Breath better Using breathing exercises may cut the use of your asthma relief medicine. You will be shown how to do the breathing exercises.  Practicing will help you remember how to do them when your symptoms flare up.    Notes:  Bronchiectasis diagnosis - wants to get over this        Depression Screen    05/23/2023    9:03 AM 05/03/2023    8:51 AM 03/20/2023    4:15 PM 12/20/2022    8:36 AM 08/25/2022    8:39 AM 05/05/2022   10:03 AM 05/05/2022    9:33 AM  PHQ 2/9 Scores  PHQ - 2 Score 0 0 0 0 0 0 0  PHQ- 9 Score  0 0 0 3 0     Fall Risk    05/19/2023   11:30 AM 05/03/2023    8:51 AM 04/05/2023    9:39 AM 08/25/2022    8:39 AM 05/05/2022  9:33 AM  Fall Risk   Falls in the past year? 0 0 0 1 1  Number falls in past yr:  0 0 0 1  Injury with Fall?  0 0 1 0  Risk for fall due to :  No Fall Risks No Fall Risks No Fall Risks History of fall(s)  Follow up   Falls evaluation completed Falls evaluation completed Falls evaluation completed  Comment    fell 10/2021     MEDICARE RISK AT HOME: Medicare Risk at  Home Any stairs in or around the home?: Yes If so, are there any without handrails?: No Home free of loose throw rugs in walkways, pet beds, electrical cords, etc?: Yes Adequate lighting in your home to reduce risk of falls?: Yes Life alert?: Yes Use of a cane, walker or w/c?: No Grab bars in the bathroom?: Yes Shower chair or bench in shower?: Yes Elevated toilet seat or a handicapped toilet?: Yes  TIMED UP AND GO:  Was the test performed?  No    Cognitive Function:        05/23/2023    9:04 AM 01/23/2022    2:23 PM 01/20/2021   11:57 AM 01/12/2020    9:17 AM  6CIT Screen  What Year? 0 points 0 points 0 points 0 points  What month? 0 points 0 points 0 points 0 points  What time? 0 points 0 points 0 points 0 points  Count back from 20 0 points 0 points 0 points 0 points  Months in reverse 0 points 0 points 0 points 0 points  Repeat phrase 0 points 0 points 0 points 0 points  Total Score 0 points 0 points 0 points 0 points    Immunizations Immunization History  Administered Date(s) Administered   Fluad Quad(high Dose 65+) 10/16/2018, 11/10/2019, 09/27/2020, 10/24/2021   Fluad Trivalent(High Dose 65+) 12/04/2022   Influenza, High Dose Seasonal PF 10/09/2016, 10/04/2017   Influenza,inj,Quad PF,6+ Mos 10/25/2012, 09/17/2013, 09/21/2015   Influenza-Unspecified 12/12/2014   Moderna Covid-19 Vaccine Bivalent Booster 38yrs & up 01/04/2021   Moderna SARS-COV2 Booster Vaccination 06/21/2020   Moderna Sars-Covid-2 Vaccination 02/28/2019, 03/28/2019, 11/14/2019   Pfizer(Comirnaty)Fall Seasonal Vaccine 12 years and older 11/08/2021, 12/20/2022   Pneumococcal Conjugate-13 03/18/2015   Pneumococcal Polysaccharide-23 02/09/2011, 09/17/2013   Tdap 03/18/2015   Zoster Recombinant(Shingrix) 02/19/2021   Zoster, Live 09/17/2011    TDAP status: Up to date  Flu Vaccine status: Up to date  Pneumococcal vaccine status: Up to date  Covid-19 vaccine status: Completed vaccines  Qualifies  for Shingles Vaccine? Yes   Zostavax completed No   Shingrix Completed?: No.    Education has been provided regarding the importance of this vaccine. Patient has been advised to call insurance company to determine out of pocket expense if they have not yet received this vaccine. Advised may also receive vaccine at local pharmacy or Health Dept. Verbalized acceptance and understanding.  Screening Tests Health Maintenance  Topic Date Due   Diabetic kidney evaluation - Urine ACR  02/02/2023   OPHTHALMOLOGY EXAM  04/05/2023   Zoster Vaccines- Shingrix (2 of 2) 08/02/2023 (Originally 04/16/2021)   HEMOGLOBIN A1C  06/20/2023   COVID-19 Vaccine (8 - 2024-25 season) 06/20/2023   INFLUENZA VACCINE  08/17/2023   FOOT EXAM  12/20/2023   Diabetic kidney evaluation - eGFR measurement  04/04/2024   MAMMOGRAM  04/22/2024   Medicare Annual Wellness (AWV)  05/22/2024   DTaP/Tdap/Td (2 - Td or Tdap) 03/17/2025   Pneumonia Vaccine 65+ Years  old  Completed   DEXA SCAN  Completed   Hepatitis C Screening  Completed   HPV VACCINES  Aged Out   Meningococcal B Vaccine  Aged Out   Colonoscopy  Discontinued    Health Maintenance  Health Maintenance Due  Topic Date Due   Diabetic kidney evaluation - Urine ACR  02/02/2023   OPHTHALMOLOGY EXAM  04/05/2023    Colorectal cancer screening: Type of screening: Colonoscopy. Completed 02/24/2019. Repeat every   years Doesn't need to have any more done.  Mammogram status: Completed 04/23/2023. Repeat every year  Bone Density status: Completed 04/23/2023. Results reflect: Bone density results: OSTEOPOROSIS. Repeat every 02 years.  Lung Cancer Screening: (Low Dose CT Chest recommended if Age 62-80 years, 20 pack-year currently smoking OR have quit w/in 15years.) does qualify.   Lung Cancer Screening Referral:   Additional Screening:  Hepatitis C Screening: does not qualify; Completed 12/28/2014  Vision Screening: Recommended annual ophthalmology exams for  early detection of glaucoma and other disorders of the eye. Is the patient up to date with their annual eye exam?  Yes  Who is the provider or what is the name of the office in which the patient attends annual eye exams? Dr Cindra Cree with [facility]. If pt is not established with a provider, would they like to be referred to a provider to establish care?  N/A .   Dental Screening: Recommended annual dental exams for proper oral hygiene  Diabetic Foot Exam: N/A today  Community Resource Referral / Chronic Care Management: CRR required this visit?  No   CCM required this visit?  No     Plan:     I have personally reviewed and noted the following in the patient's chart:   Medical and social history Use of alcohol, tobacco or illicit drugs  Current medications and supplements including opioid prescriptions. Patient is not currently taking opioid prescriptions. Functional ability and status Nutritional status Physical activity Advanced directives List of other physicians Hospitalizations, surgeries, and ER visits in previous 12 months Vitals Screenings to include cognitive, depression, and falls Referrals and appointments  In addition, I have reviewed and discussed with patient certain preventive protocols, quality metrics, and best practice recommendations. A written personalized care plan for preventive services as well as general preventive health recommendations were provided to patient.     Delford Felling, FNP [facility] [phone]    05/23/2023   After Visit Summary: (Declined) Due to this being a telephonic visit, with patients personalized plan was offered to patient but patient Declined AVS at this time

## 2023-05-23 NOTE — Assessment & Plan Note (Signed)

## 2023-05-26 ENCOUNTER — Other Ambulatory Visit: Payer: Self-pay | Admitting: Family Medicine

## 2023-05-26 ENCOUNTER — Other Ambulatory Visit: Payer: Self-pay | Admitting: Physician Assistant

## 2023-05-26 DIAGNOSIS — E1159 Type 2 diabetes mellitus with other circulatory complications: Secondary | ICD-10-CM

## 2023-06-06 ENCOUNTER — Ambulatory Visit (INDEPENDENT_AMBULATORY_CARE_PROVIDER_SITE_OTHER): Admitting: Pulmonary Disease

## 2023-06-06 ENCOUNTER — Encounter: Payer: Self-pay | Admitting: Pulmonary Disease

## 2023-06-06 VITALS — BP 110/70 | HR 77 | Ht 62.0 in | Wt 190.8 lb

## 2023-06-06 DIAGNOSIS — J4540 Moderate persistent asthma, uncomplicated: Secondary | ICD-10-CM

## 2023-06-06 DIAGNOSIS — R058 Other specified cough: Secondary | ICD-10-CM

## 2023-06-06 MED ORDER — LEVOFLOXACIN 500 MG PO TABS: 500.0000 mg | ORAL_TABLET | Freq: Every day | ORAL | 0 refills | Status: AC

## 2023-06-06 NOTE — Patient Instructions (Signed)
 Continue the inhalers, I would consider decreasing the albuterol  to twice a day and if cough is no worse use albuterol  as needed, continue Stiolto once a day  Take levofloxacin , antibiotic, as prescribed once daily for 10 days  We will place an order for a flutter valve do this 5-10 times a minute, blow into it.  Repeat this 5 times.  Morning and evening.  Paperwork today for Dupixent as I do think asthma is a major problem here with the eosinophil count in your blood  Return to clinic in 3 months or sooner as needed with Dr. Marygrace Snellen

## 2023-06-07 ENCOUNTER — Telehealth: Payer: Self-pay

## 2023-06-07 NOTE — Progress Notes (Signed)
 @Patient  ID: Diana Hendrix, female    DOB: [birth year], 78 y.o.   MRN: [id number]  Chief Complaint  Patient presents with   Follow-up    Referring provider: Mercy Stall, MD  HPI:   78 y.o. woman whom we are seeing for evaluation of congestion cough wheeze dyspnea.  Multiple PCP notes reviewed.  Returns for follow-up.  Cough no improvement.  Cough is biggest complaint.  In interim CT scan was obtained on my review/interpretation reveals clear lungs, there is the mildest of bronchiectatic changes in the bases.  We discussed at length that this could be contributing.  Although I think this is a minor contributor.  We discussed at length trigger of symptoms with pneumonia in the spring 2024.  Still worry about asthma or ongoing inflammation and yet to improve.  Causing cough bronchospasm etc.  We discussed escalating therapies.  We discussed her limitations given she cannot tolerate ICS therapies.  She denies significant nasal drip or rhinorrhea.  She denies heartburn or reflux symptoms.  HPI initial visit: Diagnosed with pneumonia 04/2022.  Based on chest x-ray.  I cannot review results.  Treated with antibiotics.  Since then has had worsening chest congestion, cough, chest tightness, dyspnea on exertion.  Waxes and wanes.  Comes and goes.  Severity is not always reliably reproducible.  Repeat chest x-ray 09/2022 reviewed, clear lungs.  CTA PE protocol 09/2022 for ongoing workup reviewed, no PE, diffuse mosaicism, no other infiltrate etc.  She had a lot of back pain and this did reveal back fractures.  This has been fixed, kyphoplasty in the interim.  Some residual pain but getting better.  Questionaires / Pulmonary Flowsheets:   ACT:  Asthma Control Test ACT Total Score  02/17/2022 11:01 AM 19    MMRC:     No data to display          Epworth:      No data to display          Tests:   FENO:  No results found for: "NITRICOXIDE"  PFT:    Latest Ref Rng & Units 01/29/2023     3:40 PM 04/25/2022   11:17 AM 08/07/2019    8:48 AM  PFT Results  FVC-Pre L 2.10  2.35  2.54   FVC-Predicted Pre % 82  91  93   FVC-Post L 2.32  2.24  2.60   FVC-Predicted Post % 91  87  96   Pre FEV1/FVC % % 85  82  84   Post FEV1/FCV % % 87  87  85   FEV1-Pre L 1.78  1.92  2.13   FEV1-Predicted Pre % 94  99  104   FEV1-Post L 2.02  1.95  2.20   DLCO uncorrected ml/min/mmHg 15.45  16.00  16.10   DLCO UNC% % 86  89  88   DLCO corrected ml/min/mmHg 15.04  16.00  16.10   DLCO COR %Predicted % 84  89  88   DLVA Predicted % 97  99  93   TLC L 4.04  4.40  4.34   TLC % Predicted % 84  92  89   RV % Predicted % 81  92  78   Personally reviewed and interpreted as 01/2023 significant bronchodilator response, otherwise normal spirometry, lung volumes DLCO within normal limits, largely unchanged over time with no findings of significant micro dilator response on review of prior PFTs  WALK:      No data to display  Imaging: Personally reviewed and as per EMR and discussion of this note CT Chest Wo Contrast Result Date: 05/22/2023 CLINICAL DATA:  Cough and dyspnea over 8 weeks. History of thoracic spine kyphoplasty approximately 3 weeks ago. EXAM: CT CHEST WITHOUT CONTRAST TECHNIQUE: Multidetector CT imaging of the chest was performed following the standard protocol without IV contrast. RADIATION DOSE REDUCTION: This exam was performed according to the departmental dose-optimization program which includes automated exposure control, adjustment of the mA and/or kV according to patient size and/or use of iterative reconstruction technique. COMPARISON:  CTA chest dated 10/12/2022. FINDINGS: Cardiovascular: Normal heart size. No pericardial effusion. Mitral annular calcifications and mild aortic valve calcifications. Foci of coronary artery calcifications. Thoracic aorta is normal in caliber with mild atherosclerotic calcification. Mediastinum/Nodes: No enlarged mediastinal or axillary lymph  nodes. Thyroid  gland and trachea demonstrate no significant findings. Patulous esophagus. Lungs/Pleura: No focal consolidation, pleural effusion, or pneumothorax. Mild bronchiectasis at the lung bases. Mild linear bibasilar atelectasis/scarring. No suspicious pulmonary nodule. Upper Abdomen: No acute abnormality. Musculoskeletal: Status post T7 vertebral augmentation. Partially evaluated severe compression deformity of the T12 vertebral body is again noted with similar bony retropulsion. IMPRESSION: 1. No acute intrathoracic findings. 2. Mild bronchiectasis at the lung bases.  No focal consolidation. 3. Status post T7 vertebral augmentation. 4. Partially evaluated severe compression deformity of the T12 vertebral body is again noted with similar bony retropulsion. Aortic Atherosclerosis (ICD10-I70.0). Electronically Signed   By: Mannie Seek M.D.   On: 05/22/2023 10:47    Lab Results: Personally reviewed CBC    Component Value Date/Time   WBC 6.8 04/05/2023 1031   WBC 11.0 (H) 11/11/2021 0101   RBC 4.72 04/05/2023 1031   RBC 3.92 11/11/2021 0101   HGB 13.8 04/05/2023 1031   HCT 42.7 04/05/2023 1031   PLT 211 04/05/2023 1031   MCV 91 04/05/2023 1031   MCH 29.2 04/05/2023 1031   MCH 30.1 11/11/2021 0101   MCHC 32.3 04/05/2023 1031   MCHC 34.1 11/11/2021 0101   RDW 14.0 04/05/2023 1031   LYMPHSABS 2.5 04/05/2023 1031   EOSABS 0.4 04/05/2023 1031   BASOSABS 0.1 04/05/2023 1031    BMET    Component Value Date/Time   NA 137 04/05/2023 1031   K 4.4 04/05/2023 1031   CL 101 04/05/2023 1031   CO2 22 04/05/2023 1031   GLUCOSE 97 04/05/2023 1031   GLUCOSE 121 (H) 11/13/2021 0043   BUN 15 04/05/2023 1031   CREATININE 1.14 (H) 04/05/2023 1031   CALCIUM  9.3 04/05/2023 1031   GFRNONAA 46 (L) 11/13/2021 0043   GFRAA 65 12/23/2019 1123    BNP    Component Value Date/Time   BNP 32.2 11/09/2021 1250    ProBNP    Component Value Date/Time   PROBNP 90 07/20/2020 1550   PROBNP 26.0  04/24/2019 0913    Specialty Problems       Pulmonary Problems   OSA (obstructive sleep apnea)          Allergic asthma   Mild intermittent asthma   Acute cough   Nasal congestion with rhinorrhea   Consolidation of left lower lobe of lung (HCC)   Asthma, moderate persistent, poorly-controlled   Asthma in adult, mild intermittent, with acute exacerbation   Upper respiratory tract infection   Wheezing   Chest congestion   Chronic cough    Allergies  Allergen Reactions   Cortisone Other (See Comments)    Passed out according to patient can take prednisone    Glucosamine  Other (See Comments)   Zyrtec [Cetirizine]     Weird dreams.    Ibuprofen Other (See Comments)    Pt states Dr doesn't wont her to take it; not allergic    Immunization History  Administered Date(s) Administered   Fluad Quad(high Dose 65+) 10/16/2018, 11/10/2019, 09/27/2020, 10/24/2021   Fluad Trivalent(High Dose 65+) 12/04/2022   Influenza, High Dose Seasonal PF 10/09/2016, 10/04/2017   Influenza,inj,Quad PF,6+ Mos 10/25/2012, 09/17/2013, 09/21/2015   Influenza-Unspecified 12/12/2014   Moderna Covid-19 Vaccine Bivalent Booster 47yrs & up 01/04/2021   Moderna SARS-COV2 Booster Vaccination 06/21/2020   Moderna Sars-Covid-2 Vaccination 02/28/2019, 03/28/2019, 11/14/2019   Pfizer(Comirnaty)Fall Seasonal Vaccine 12 years and older 11/08/2021, 12/20/2022   Pneumococcal Conjugate-13 03/18/2015   Pneumococcal Polysaccharide-23 02/09/2011, 09/17/2013   Tdap 03/18/2015   Zoster Recombinant(Shingrix) 02/19/2021   Zoster, Live 09/17/2011    Past Medical History:  Diagnosis Date   Cancer (HCC)    Diabetes mellitus without complication (HCC)    Diastolic heart failure (HCC)    Echo EF 60/65% Heart monitor was normal   GERD (gastroesophageal reflux disease)    History of colon polyps    Hyperlipidemia    Hypertension    Macular edema, cystoid    OSA (obstructive sleep apnea) 09/17/2012   Osteopenia     Plantar fasciitis 09/09/2019   Sleep apnea     Tobacco History: Social History   Tobacco Use  Smoking Status Never   Passive exposure: Past  Smokeless Tobacco Never   Counseling given: Not Answered   Continue to not smoke  Outpatient Encounter Medications as of 06/06/2023  Medication Sig   albuterol  (VENTOLIN  HFA) 108 (90 Base) MCG/ACT inhaler Inhale 2 puffs into the lungs every 6 (six) hours as needed for wheezing or shortness of breath.   Cholecalciferol (VITAMIN D3) 50 MCG (2000 UT) TABS Take 1 tablet by mouth daily.   citalopram  (CELEXA ) 10 MG tablet TAKE 1 TABLET(10 MG) BY MOUTH DAILY   diltiazem  (CARDIZEM  CD) 180 MG 24 hr capsule TAKE 1 TABLET(180 MG) BY MOUTH DAILY   fluticasone  (FLONASE ) 50 MCG/ACT nasal spray Place 2 sprays into both nostrils daily.   furosemide  (LASIX ) 20 MG tablet Take 1 tablet (20 mg total) by mouth daily. Patient needs to make an appointment for further refills. 1st attempt.   levofloxacin  (LEVAQUIN ) 500 MG tablet Take 1 tablet (500 mg total) by mouth daily for 10 days.   loratadine  (CLARITIN ) 10 MG tablet Take 1 tablet (10 mg total) by mouth daily.   NON FORMULARY CPAP at bedtime   olmesartan  (BENICAR ) 20 MG tablet Take 1 tablet (20 mg total) by mouth daily.   omega-3 acid ethyl esters (LOVAZA ) 1 g capsule TAKE 2 CAPSULES BY MOUTH TWICE DAILY   omeprazole  (PRILOSEC) 40 MG capsule TAKE 1 CAPSULE(40 MG) BY MOUTH IN THE MORNING AND AT BEDTIME   Polyethyl Glycol-Propyl Glycol (SYSTANE OP) Place 1 drop into both eyes 4 (four) times daily.   polyethylene glycol (MIRALAX  / GLYCOLAX ) 17 g packet Take 17 g by mouth daily.   rosuvastatin  (CRESTOR ) 40 MG tablet TAKE 1 TABLET(40 MG) BY MOUTH DAILY   Tiotropium Bromide-Olodaterol (STIOLTO RESPIMAT ) 2.5-2.5 MCG/ACT AERS Inhale 2 puffs into the lungs daily.   zolpidem  (AMBIEN ) 5 MG tablet Take 1 tablet (5 mg total) by mouth at bedtime as needed for sleep.   Facility-Administered Encounter Medications as of  06/06/2023  Medication   [START ON 11/09/2023] denosumab  (PROLIA ) injection 60 mg     Review of Systems  Review of Systems  N/a  Physical Exam  BP 110/70 (BP Location: Right Arm, Patient Position: Sitting, Cuff Size: Large)   Pulse 77   Ht 5\' 2"  (1.575 m)   Wt 190 lb 12.8 oz (86.5 kg)   SpO2 96%   BMI 34.90 kg/m   Wt Readings from Last 5 Encounters:  06/06/23 190 lb 12.8 oz (86.5 kg)  05/22/23 194 lb (88 kg)  05/03/23 191 lb 6.4 oz (86.8 kg)  04/23/23 191 lb (86.6 kg)  04/18/23 189 lb (85.7 kg)    BMI Readings from Last 5 Encounters:  06/06/23 34.90 kg/m  05/22/23 35.48 kg/m  05/03/23 35.01 kg/m  04/23/23 34.93 kg/m  04/18/23 34.57 kg/m     Physical Exam General: Sitting in chair, in no acute distress eyes: EOMI, no icterus Neck: Supple, no JVD Pulmonary: Clear, normal work of breathing Cardiovascular: Warm, no edema Abdomen: Nondistended, MSK: No synovitis no major effusion Neuro: Normal gait, no weakness Psych: Normal mood, full affect   Assessment & Plan:   Chest congestion, wheeze, intermittent dyspnea, chest tightness likely related to asthma: 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  Likely asthma.  Possibly triggered or worsened after pneumonia diagnosis 04/2022.  CT scan in the interim with mosaicism otherwise clear lungs.  Chest x-ray also clear.  Hoarseness of voice with Trelegy, Wixela, Symbicort .  No improvement in symptoms with Spiriva  monotherapy.  No improvement with Stiolto.  Discussed limitations in treatment with inability to tolerate ICS.  Try to avoid ICS in the future.  Eosinophil count 400/2025, new order for Dupixent today.  OSA: Reviewed compliance report, excellent compliance.  Well treated with AHI 0.3.  She is certainly benefiting clinically from CPAP usage.  Encouraged to continue CPAP.   Return in about 3 months (around 09/06/2023) for f/u Dr. Marygrace Snellen.   Guerry Leek, MD 06/07/2023   This  appointment required 42 minutes of patient care (this includes precharting, chart review, review of results, face-to-face care, etc.).

## 2023-06-07 NOTE — Telephone Encounter (Signed)
 Received new start paperwork for Dupixent  Submitted a Prior Authorization request to [employer] for DUPIXENT via CoverMyMeds. Will update once we receive a response.  Key: Lem Pyle, PharmD, MPH, BCPS, CPP Clinical Pharmacist (Rheumatology and Pulmonology)

## 2023-06-08 ENCOUNTER — Other Ambulatory Visit (HOSPITAL_COMMUNITY): Payer: Self-pay

## 2023-06-08 NOTE — Telephone Encounter (Signed)
 Received notification from [employer] regarding a prior authorization for DUPIXENT. Authorization has been APPROVED from 05/08/23 to 12/04/2023.  Per test claim, copay for 28 days supply is $1233.69  Authorization # [id number]  Will need to submit [employer] PAP

## 2023-06-13 ENCOUNTER — Ambulatory Visit: Payer: Medicare Other | Admitting: Pulmonary Disease

## 2023-06-18 ENCOUNTER — Encounter: Payer: Self-pay | Admitting: Pulmonary Disease

## 2023-06-18 ENCOUNTER — Other Ambulatory Visit (HOSPITAL_COMMUNITY): Payer: Self-pay

## 2023-06-18 NOTE — Telephone Encounter (Signed)
Submitted Patient Assistance Application to [employer] for DUPIXENT along with provider portion, patient portion, PA, medication list, insurance card copy and income documents. Will update patient when we receive a response.  Phone #: [phone] Fax #: [phone]

## 2023-06-19 NOTE — Telephone Encounter (Signed)
**Note De-identified  [place] Obfuscation** Please advise 

## 2023-06-19 NOTE — Telephone Encounter (Signed)
 Patient provided with [employer] phone number to express financial hardship (via MyChart)

## 2023-06-27 NOTE — Telephone Encounter (Signed)
 Called patient- scheduled for DPX new start on 06/28/23

## 2023-06-27 NOTE — Telephone Encounter (Signed)
 Received a fax from  [employer] regarding an approval for DUPIXENT patient assistance from 06/26/2023 for a period up to 12 months. Approval letter sent to scan center.  Phone #: [phone] Fax #: [phone]

## 2023-06-28 ENCOUNTER — Ambulatory Visit: Admitting: Pharmacist

## 2023-06-28 DIAGNOSIS — Z7189 Other specified counseling: Secondary | ICD-10-CM

## 2023-06-28 DIAGNOSIS — J4550 Severe persistent asthma, uncomplicated: Secondary | ICD-10-CM | POA: Diagnosis not present

## 2023-06-28 MED ORDER — DUPIXENT 300 MG/2ML ~~LOC~~ SOAJ: 300.0000 mg | SUBCUTANEOUS | 1 refills | Status: DC

## 2023-06-28 NOTE — Patient Instructions (Signed)
 Your next DUPIXENT dose is due on 07/12/23, 07/26/2023, and every 14 days thereafter  CONTINUE Stiolto 2 puffs once daily  Your prescription will be shipped from [facility]. Their phone number is [phone] Please call to schedule shipment and confirm address. They will mail your medication to your home.  You will need to be seen by your provider in 3 to 4 months to assess how DUPIXENT is working for you. Please ensure you have a follow-up appointment scheduled in [place] OR OCTOBER 2025. Call our clinic if you need to make this appointment.  Stay up to date on all routine vaccines: influenza, pneumonia, COVID19, Shingles  How to manage an injection site reaction: Remember the 5 C's: COUNTER - leave on the counter at least 30 minutes but up to overnight to bring medication to room temperature. This may help prevent stinging COLD - place something cold (like an ice gel pack or cold water bottle) on the injection site just before cleansing with alcohol. This may help reduce pain CLARITIN  - use Claritin  (generic name is loratadine ) for the first two weeks of treatment or the day of, the day before, and the day after injecting. This will help to minimize injection site reactions CORTISONE CREAM - apply if injection site is irritated and itching CALL ME - if injection site reaction is bigger than the size of your fist, looks infected, blisters, or if you develop hives

## 2023-06-28 NOTE — Progress Notes (Addendum)
 HPI Patient presents today to [facility] to see pharmacy team for Dupixent new start.  Past medical history includes HTN, OSA,  T2DM, osteoporosis (on Prolia )  Last seen by Dr. Marygrace Snellen on 06/06/2023. Persistent cough and congestion since pneumonia last year. Uses albuterol  for cough which has helped. Reports that she has no longer needed to use albuterol  3-4 times daily  Respiratory Medications Current regimen: Stiolto Respimat  2.5-2.61mcg (2 puffs once daily) Patient reports no known adherence challenges  OBJECTIVE Allergies  Allergen Reactions   Cortisone Other (See Comments)    Passed out according to patient can take prednisone    Glucosamine Other (See Comments)   Zyrtec [Cetirizine]     Weird dreams.    Ibuprofen Other (See Comments)    Pt states Dr doesn't wont her to take it; not allergic    Outpatient Encounter Medications as of 06/28/2023  Medication Sig   albuterol  (VENTOLIN  HFA) 108 (90 Base) MCG/ACT inhaler Inhale 2 puffs into the lungs every 6 (six) hours as needed for wheezing or shortness of breath.   Cholecalciferol (VITAMIN D3) 50 MCG (2000 UT) TABS Take 1 tablet by mouth daily.   citalopram  (CELEXA ) 10 MG tablet TAKE 1 TABLET(10 MG) BY MOUTH DAILY   diltiazem  (CARDIZEM  CD) 180 MG 24 hr capsule TAKE 1 TABLET(180 MG) BY MOUTH DAILY   fluticasone  (FLONASE ) 50 MCG/ACT nasal spray Place 2 sprays into both nostrils daily.   furosemide  (LASIX ) 20 MG tablet Take 1 tablet (20 mg total) by mouth daily. Patient needs to make an appointment for further refills. 1st attempt.   loratadine  (CLARITIN ) 10 MG tablet Take 1 tablet (10 mg total) by mouth daily.   NON FORMULARY CPAP at bedtime   olmesartan  (BENICAR ) 20 MG tablet Take 1 tablet (20 mg total) by mouth daily.   omega-3 acid ethyl esters (LOVAZA ) 1 g capsule TAKE 2 CAPSULES BY MOUTH TWICE DAILY   omeprazole  (PRILOSEC) 40 MG capsule TAKE 1 CAPSULE(40 MG) BY MOUTH IN THE MORNING AND AT BEDTIME   Polyethyl  Glycol-Propyl Glycol (SYSTANE OP) Place 1 drop into both eyes 4 (four) times daily.   polyethylene glycol (MIRALAX  / GLYCOLAX ) 17 g packet Take 17 g by mouth daily.   rosuvastatin  (CRESTOR ) 40 MG tablet TAKE 1 TABLET(40 MG) BY MOUTH DAILY   Tiotropium Bromide-Olodaterol (STIOLTO RESPIMAT ) 2.5-2.5 MCG/ACT AERS Inhale 2 puffs into the lungs daily.   zolpidem  (AMBIEN ) 5 MG tablet Take 1 tablet (5 mg total) by mouth at bedtime as needed for sleep.   Facility-Administered Encounter Medications as of 06/28/2023  Medication   [START ON 11/09/2023] denosumab  (PROLIA ) injection 60 mg     Immunization History  Administered Date(s) Administered   Fluad Quad(high Dose 65+) 10/16/2018, 11/10/2019, 09/27/2020, 10/24/2021   Fluad Trivalent(High Dose 65+) 12/04/2022   Influenza, High Dose Seasonal PF 10/09/2016, 10/04/2017   Influenza,inj,Quad PF,6+ Mos 10/25/2012, 09/17/2013, 09/21/2015   Influenza-Unspecified 12/12/2014   Moderna Covid-19 Vaccine Bivalent Booster 25yrs & up 01/04/2021   Moderna SARS-COV2 Booster Vaccination 06/21/2020   Moderna Sars-Covid-2 Vaccination 02/28/2019, 03/28/2019, 11/14/2019   Pfizer(Comirnaty)Fall Seasonal Vaccine 12 years and older 11/08/2021, 12/20/2022   Pneumococcal Conjugate-13 03/18/2015   Pneumococcal Polysaccharide-23 02/09/2011, 09/17/2013   Tdap 03/18/2015   Zoster Recombinant(Shingrix) 02/19/2021   Zoster, Live 09/17/2011     PFTs    Latest Ref Rng & Units 01/29/2023    3:40 PM 04/25/2022   11:17 AM 08/07/2019    8:48 AM  PFT Results  FVC-Pre L 2.10  2.35  2.54   FVC-Predicted Pre % 82  91  93   FVC-Post L 2.32  2.24  2.60   FVC-Predicted Post % 91  87  96   Pre FEV1/FVC % % 85  82  84   Post FEV1/FCV % % 87  87  85   FEV1-Pre L 1.78  1.92  2.13   FEV1-Predicted Pre % 94  99  104   FEV1-Post L 2.02  1.95  2.20   DLCO uncorrected ml/min/mmHg 15.45  16.00  16.10   DLCO UNC% % 86  89  88   DLCO corrected ml/min/mmHg 15.04  16.00  16.10   DLCO COR  %Predicted % 84  89  88   DLVA Predicted % 97  99  93   TLC L 4.04  4.40  4.34   TLC % Predicted % 84  92  89   RV % Predicted % 81  92  78     Eosinophils Most recent blood eosinophil count was 400 cells/microL taken on 04/05/2023.   Assessment   Biologics training for dupilumab (Dupixent)  Goals of therapy: Mechanism: human monoclonal IgG4 antibody that inhibits interleukin-4 and interleukin-13 cytokine-induced responses, including release of proinflammatory cytokines, chemokines, and IgE Reviewed that Dupixent is add-on medication and patient must continue maintenance inhaler regimen. Response to therapy: may take 4 months to determine efficacy. Discussed that patients generally feel improvement sooner than 4 months.  Side effects: injection site reaction (6-18%), antibody development (5-16%), ophthalmic conjunctivitis (2-16%), transient blood eosinophilia (1-2%)  Dose: 600mg  at Week 0 (administered today in clinic) followed by 300mg  every 14 days thereafter  Administration/Storage:  Reviewed administration sites of thigh or abdomen (at least 2-3 inches away from abdomen). Reviewed the upper arm is only appropriate if caregiver is administering injection  Do not shake pen/syringe as this could lead to product foaming or precipitation. Do not use if solution is discolored or contains particulate matter or if window on prefilled pen is yellow (indicates pen has been used).  Reviewed storage of medication in refrigerator. Reviewed that Dupixent can be stored at room temperature in unopened carton for up to 14 days.  Access: Approval of Dupixent through: patient assistance  Patient self-administered Dupixent 300mg /18ml x 2 (total dose 600mg ) in right lower abdomen and left lower abdomen using sample Dupixent 300mg /84mL autoinjector pen NDC: [phone] Lot: [id number] Expiration: 01/15/2025  Patient monitored for 30 minutes for adverse reaction.  Patient tolerated well.  Injection site  noted. Patient denies itchiness and irritation at injection., No swelling noted. Slight redness at both sites but patient denies bothersome symptoms. and Reviewed injection site reaction management with patient verbally and printed information for review in AVS  Medication Reconciliation  A drug regimen assessment was performed, including review of allergies, interactions, disease-state management, dosing and immunization history. Medications were reviewed with the patient, including name, instructions, indication, goals of therapy, potential side effects, importance of adherence, and safe use.  Drug interaction(s): none noted  PLAN Continue Dupixent 300mg  every 14 days.  Next dose is due 07/12/23 and every 14 days thereafter. Rx sent to: [facility]: [phone].  Patient provided with pharmacy phone number and advised to call later this week to schedule shipment to home.  Continue maintenance inhaler regimen of: Stiolto Respimat  2.5-2.5mcg (2 puffs once daily)  All questions encouraged and answered.  Instructed patient to reach out with any further questions or concerns.  Thank you for allowing pharmacy to participate in this patient's care.  This  appointment required 45 minutes of patient care (this includes precharting, chart review, review of results, face-to-face care, etc.).   Geraldene Kleine, PharmD, MPH, BCPS, CPP Clinical Pharmacist (Rheumatology and Pulmonology)

## 2023-07-03 DIAGNOSIS — H18593 Other hereditary corneal dystrophies, bilateral: Secondary | ICD-10-CM | POA: Diagnosis not present

## 2023-07-03 DIAGNOSIS — E119 Type 2 diabetes mellitus without complications: Secondary | ICD-10-CM | POA: Diagnosis not present

## 2023-07-03 DIAGNOSIS — H5203 Hypermetropia, bilateral: Secondary | ICD-10-CM | POA: Diagnosis not present

## 2023-07-03 DIAGNOSIS — H04123 Dry eye syndrome of bilateral lacrimal glands: Secondary | ICD-10-CM | POA: Diagnosis not present

## 2023-07-03 LAB — HM DIABETES EYE EXAM

## 2023-07-05 ENCOUNTER — Encounter: Payer: Self-pay | Admitting: Family Medicine

## 2023-07-12 ENCOUNTER — Encounter: Payer: Self-pay | Admitting: Pulmonary Disease

## 2023-07-12 ENCOUNTER — Telehealth (HOSPITAL_BASED_OUTPATIENT_CLINIC_OR_DEPARTMENT_OTHER): Payer: Self-pay

## 2023-07-12 NOTE — Telephone Encounter (Signed)
 Copied from CRM [id number]. Topic: Clinical - Medical Advice >> Jul 12, 2023 12:38 PM Devaughn RAMAN wrote: Reason for CRM: Patient jerked her Dupilumab  (DUPIXENT ) 300 MG/2ML SOAJ shot out of her arm before she was able to inject herself and she is needing assistance, patient states she needs to give herself a shot today but she is unsure how to proceed. Please follow up with patient regarding this.  Contacted [facility] spoke with Thersia, advised patient someone will f/u with her, she was thankful and verbalized understanding.

## 2023-07-13 NOTE — Telephone Encounter (Signed)
 Returned call to patient . She actually has an extra dose of Dupixent  at home. She will use this today. Reviewed administration technique again. She verbalized understanding and appreciated call back  Sherry Pennant, PharmD, MPH, BCPS, CPP Clinical Pharmacist (Rheumatology and Pulmonology)

## 2023-08-01 ENCOUNTER — Other Ambulatory Visit: Payer: Self-pay

## 2023-08-01 DIAGNOSIS — E1159 Type 2 diabetes mellitus with other circulatory complications: Secondary | ICD-10-CM

## 2023-08-01 DIAGNOSIS — E782 Mixed hyperlipidemia: Secondary | ICD-10-CM

## 2023-08-02 ENCOUNTER — Other Ambulatory Visit

## 2023-08-02 DIAGNOSIS — E782 Mixed hyperlipidemia: Secondary | ICD-10-CM

## 2023-08-02 DIAGNOSIS — I152 Hypertension secondary to endocrine disorders: Secondary | ICD-10-CM | POA: Diagnosis not present

## 2023-08-02 DIAGNOSIS — E1159 Type 2 diabetes mellitus with other circulatory complications: Secondary | ICD-10-CM | POA: Diagnosis not present

## 2023-08-02 LAB — CBC WITH DIFFERENTIAL/PLATELET
Basophils Absolute: 0.1 x10E3/uL (ref 0.0–0.2)
Basos: 1 %
EOS (ABSOLUTE): 0.4 x10E3/uL (ref 0.0–0.4)
Eos: 7 %
Hematocrit: 42.2 % (ref 34.0–46.6)
Hemoglobin: 13.8 g/dL (ref 11.1–15.9)
Immature Grans (Abs): 0 x10E3/uL (ref 0.0–0.1)
Immature Granulocytes: 0 %
Lymphocytes Absolute: 2.1 x10E3/uL (ref 0.7–3.1)
Lymphs: 32 %
MCH: 30 pg (ref 26.6–33.0)
MCHC: 32.7 g/dL (ref 31.5–35.7)
MCV: 92 fL (ref 79–97)
Monocytes Absolute: 0.5 x10E3/uL (ref 0.1–0.9)
Monocytes: 8 %
Neutrophils Absolute: 3.4 x10E3/uL (ref 1.4–7.0)
Neutrophils: 52 %
Platelets: 226 x10E3/uL (ref 150–450)
RBC: 4.6 x10E6/uL (ref 3.77–5.28)
RDW: 13.1 % (ref 11.7–15.4)
WBC: 6.5 x10E3/uL (ref 3.4–10.8)

## 2023-08-02 LAB — COMPREHENSIVE METABOLIC PANEL WITH GFR
ALT: 18 IU/L (ref 0–32)
AST: 25 IU/L (ref 0–40)
Albumin: 4.5 g/dL (ref 3.8–4.8)
Alkaline Phosphatase: 62 IU/L (ref 44–121)
BUN/Creatinine Ratio: 12 (ref 12–28)
BUN: 16 mg/dL (ref 8–27)
Bilirubin Total: 0.5 mg/dL (ref 0.0–1.2)
CO2: 21 mmol/L (ref 20–29)
Calcium: 9.3 mg/dL (ref 8.7–10.3)
Chloride: 101 mmol/L (ref 96–106)
Creatinine, Ser: 1.32 mg/dL — ABNORMAL HIGH (ref 0.57–1.00)
Globulin, Total: 2.6 g/dL (ref 1.5–4.5)
Glucose: 102 mg/dL — ABNORMAL HIGH (ref 70–99)
Potassium: 4.5 mmol/L (ref 3.5–5.2)
Sodium: 138 mmol/L (ref 134–144)
Total Protein: 7.1 g/dL (ref 6.0–8.5)
eGFR: 42 mL/min/1.73 — ABNORMAL LOW (ref >59)

## 2023-08-02 LAB — LIPID PANEL
Chol/HDL Ratio: 3 ratio (ref 0.0–4.4)
Cholesterol, Total: 136 mg/dL (ref 100–199)
HDL: 45 mg/dL (ref >39)
LDL Chol Calc (NIH): 67 mg/dL (ref 0–99)
Triglycerides: 137 mg/dL (ref 0–149)
VLDL Cholesterol Cal: 24 mg/dL (ref 5–40)

## 2023-08-02 LAB — HEMOGLOBIN A1C
Est. average glucose Bld gHb Est-mCnc: 126 mg/dL
Hgb A1c MFr Bld: 6 % — ABNORMAL HIGH (ref 4.8–5.6)

## 2023-08-03 ENCOUNTER — Ambulatory Visit: Payer: Self-pay | Admitting: Family Medicine

## 2023-08-03 ENCOUNTER — Other Ambulatory Visit (HOSPITAL_BASED_OUTPATIENT_CLINIC_OR_DEPARTMENT_OTHER): Payer: Self-pay

## 2023-08-03 ENCOUNTER — Ambulatory Visit (HOSPITAL_BASED_OUTPATIENT_CLINIC_OR_DEPARTMENT_OTHER)
Admission: EM | Admit: 2023-08-03 | Discharge: 2023-08-03 | Disposition: A | Discharge status: Home / Self Care | Attending: Family Medicine | Admitting: Family Medicine

## 2023-08-03 ENCOUNTER — Encounter (HOSPITAL_BASED_OUTPATIENT_CLINIC_OR_DEPARTMENT_OTHER): Payer: Self-pay

## 2023-08-03 DIAGNOSIS — K219 Gastro-esophageal reflux disease without esophagitis: Secondary | ICD-10-CM

## 2023-08-03 MED ORDER — ALUM & MAG HYDROXIDE-SIMETH 200-200-20 MG/5ML PO SUSP
30.0000 mL | Freq: Once | ORAL | Status: AC
  Administered 2023-08-03: 30 mL via ORAL

## 2023-08-03 MED ORDER — LIDOCAINE VISCOUS HCL 2 % MT SOLN
15.0000 mL | Freq: Once | OROMUCOSAL | Status: AC
  Administered 2023-08-03: 15 mL via OROMUCOSAL

## 2023-08-03 MED ORDER — PANTOPRAZOLE SODIUM 40 MG PO TBEC
40.0000 mg | DELAYED_RELEASE_TABLET | Freq: Every day | ORAL | 0 refills | Status: DC
  Filled 2023-08-03: qty 30, 30d supply, fill #0

## 2023-08-03 NOTE — ED Provider Notes (Signed)
 [facility]    CSN: [id number] Arrival date & time: 08/03/23  1632      History   Chief Complaint Chief Complaint  Patient presents with   Lump in throat    HPI Diana Hendrix is a 78 y.o. female.   Patient is a 78 year old female who presents today with acid reflux, lump in throat.  She has been having heartburn symptoms for over a year. States this episode started yesterday and worsened this morning. Reports burping a lot and feels as if food is stuck in lower throat. States severe pain with her belching today as if the burp is too big for the space. States chronic cough with these symptoms.  She took 3 omeprazole  today.  She has been taking omeprazole  regularly for over the last couple of weeks.  She has been trying to eat small meals and avoid irritating foods.  Denies any chest pain or shortness of breath. No abdominal pain      Past Medical History:  Diagnosis Date   Cancer (HCC)    Diabetes mellitus without complication (HCC)    Diastolic heart failure (HCC)    Echo EF 60/65% Heart monitor was normal   GERD (gastroesophageal reflux disease)    History of colon polyps    Hyperlipidemia    Hypertension    Macular edema, cystoid    OSA (obstructive sleep apnea) 09/17/2012   Osteopenia    Plantar fasciitis 09/09/2019   Sleep apnea     Patient Active Problem List   Diagnosis Date Noted   Severe persistent asthma without complication 06/28/2023   Encounter for Medicare annual wellness exam 05/23/2023   Chronic cough 05/22/2023   Chest congestion 05/22/2023   Abnormal chest x-ray 05/22/2023   Wheezing 04/18/2023   Asthma in adult, mild intermittent, with acute exacerbation 04/18/2023   Upper respiratory tract infection 04/18/2023   Essential hypertension, benign 02/18/2023   Compression fracture of thoracic vertebra with delayed healing 02/18/2023   Asthma, moderate persistent, poorly-controlled 12/23/2022   Chronic right flank pain 12/04/2022    Bursitis of right hip 12/04/2022   Trochanteric bursitis of right hip 11/24/2022   Pain in thoracic spine 11/21/2022   Osteoporosis of vertebra 10/19/2022   Chest pain on breathing 10/12/2022   Consolidation of left lower lobe of lung (HCC) 09/28/2022   Nasal congestion with rhinorrhea 09/20/2022   Acute cough 09/08/2022   Closed fracture of right side of maxilla, initial encounter (HCC) 09/08/2022   Class 1 obesity due to excess calories with serious comorbidity and body mass index (BMI) of 34.0 to 34.9 in adult 08/27/2022   Shoulder pain, right 08/25/2022   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   Acute left-sided low back pain without sciatica 05/06/2022   Mild intermittent asthma 02/19/2022   Mild vitamin D  deficiency 02/19/2022   Closed fracture of twelfth thoracic vertebra (HCC) 12/06/2021   CKD (chronic kidney disease) stage 3, GFR 30-59 ml/min (HCC) 11/09/2021   Leukocytosis 11/09/2021   Allergic asthma 11/09/2021   T12 compression fracture (HCC) 11/09/2021   Nasal bone fractures 11/09/2021   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   Bulge of cervical disc without myelopathy 08/19/2020   Macular edema, cystoid    History of colon polyps    Biceps tendonitis on right 01/23/2020   Osteopenia 01/12/2020   Positive ANA (antinuclear antibody) 12/10/2019   Heel cord tightness, right 09/09/2019   Plantar fasciitis 09/09/2019   Hypertensive heart disease with chronic  diastolic congestive heart failure (HCC) 08/14/2019   Migraine with visual aura 10/16/2018   Chronic idiopathic constipation 10/16/2018   Encounter for other orthopedic aftercare 08/23/2017   Syncope, vasovagal 04/16/2017   Hypertension associated with diabetes (HCC) 10/11/2016   S/P right knee arthroscopy 09/21/2016   Chronic pain of both knees 05/17/2016   Mixed hyperlipidemia 09/17/2013   OSA (obstructive sleep apnea) 09/17/2012   GERD (gastroesophageal reflux disease) 09/17/2012    Other insomnia 09/17/2012    Past Surgical History:  Procedure Laterality Date   CESAREAN SECTION     COLONOSCOPY  10/29/2013   Colonic polyp status post polypectomy. Mild sigmoid diverticulosis. Small internal hemorrhoids   FOOT NEUROMA SURGERY     s/p surgery in both feet   FOOT SURGERY Bilateral    Per patient   MENISCUS REPAIR Left 09/13/2016   miniscus repair right  Right 2019   SHOULDER SURGERY Left    arthroscopy.    TUBAL LIGATION      OB History   No obstetric history on file.      Home Medications    Prior to Admission medications   Medication Sig Start Date End Date Taking? Authorizing Provider  pantoprazole  (PROTONIX ) 40 MG tablet Take 1 tablet (40 mg total) by mouth daily. 08/03/23 09/02/23 Yes Madina Galati A, FNP  albuterol  (VENTOLIN  HFA) 108 (90 Base) MCG/ACT inhaler Inhale 2 puffs into the lungs every 6 (six) hours as needed for wheezing or shortness of breath. 05/03/23   Milon Cleaves, PA  Cholecalciferol (VITAMIN D3) 50 MCG (2000 UT) TABS Take 1 tablet by mouth daily.    [provider]  citalopram  (CELEXA ) 10 MG tablet TAKE 1 TABLET(10 MG) BY MOUTH DAILY 04/15/23   Cox, Kirsten, MD  diltiazem  (CARDIZEM  CD) 180 MG 24 hr capsule TAKE 1 TABLET(180 MG) BY MOUTH DAILY 05/27/23   Cox, Kirsten, MD  Dupilumab  (DUPIXENT ) 300 MG/2ML SOAJ Inject 300 mg into the skin every 14 (fourteen) days. 06/28/23   Hunsucker, Donnice SAUNDERS, MD  fluticasone  (FLONASE ) 50 MCG/ACT nasal spray Place 2 sprays into both nostrils daily. 01/08/23   CoxAbigail, MD  furosemide  (LASIX ) 20 MG tablet Take 1 tablet (20 mg total) by mouth daily. Patient needs to make an appointment for further refills. 1st attempt. 05/03/23   Milon Cleaves, PA  loratadine  (CLARITIN ) 10 MG tablet Take 1 tablet (10 mg total) by mouth daily. 05/03/23   Milon Cleaves, PA  NON FORMULARY CPAP at bedtime    [provider]  olmesartan  (BENICAR ) 20 MG tablet Take 1 tablet (20 mg total) by mouth daily. 05/08/23   Cox,  Abigail, MD  omega-3 acid ethyl esters (LOVAZA ) 1 g capsule TAKE 2 CAPSULES BY MOUTH TWICE DAILY 03/08/23   Cox, Kirsten, MD  Polyethyl Glycol-Propyl Glycol (SYSTANE OP) Place 1 drop into both eyes 4 (four) times daily.    [provider]  polyethylene glycol (MIRALAX  / GLYCOLAX ) 17 g packet Take 17 g by mouth daily.    [provider]  rosuvastatin  (CRESTOR ) 40 MG tablet TAKE 1 TABLET(40 MG) BY MOUTH DAILY 02/28/23   Cox, Kirsten, MD  Tiotropium Bromide-Olodaterol (STIOLTO RESPIMAT ) 2.5-2.5 MCG/ACT AERS Inhale 2 puffs into the lungs daily. 04/17/23   Hunsucker, Donnice SAUNDERS, MD  zolpidem  (AMBIEN ) 5 MG tablet Take 1 tablet (5 mg total) by mouth at bedtime as needed for sleep. 04/05/23   Sherre Abigail, MD    Family History Family History  Problem Relation Age of Onset   Hyperlipidemia  Mother    Heart disease Mother    Hypertension Mother    Atrial fibrillation Mother    CAD Mother    Congestive Heart Failure Mother    CVA Father    Hypertension Brother    Hypertension Son    Colon cancer Neg Hx    Esophageal cancer Neg Hx     Social History Social History   Tobacco Use   Smoking status: Never    Passive exposure: Past   Smokeless tobacco: Never  Vaping Use   Vaping status: Never Used  Substance Use Topics   Alcohol use: No   Drug use: No     Allergies   Cortisone, Glucosamine, Zyrtec [cetirizine], and Ibuprofen   Review of Systems Review of Systems See HPI  Physical Exam Triage Vital Signs ED Triage Vitals  Encounter Vitals Group     BP 08/03/23 1641 105/71     Girls Systolic BP Percentile --      Girls Diastolic BP Percentile --      Boys Systolic BP Percentile --      Boys Diastolic BP Percentile --      Pulse Rate 08/03/23 1641 80     Resp 08/03/23 1641 20     Temp 08/03/23 1641 98.3 F (36.8 C)     Temp Source 08/03/23 1641 Oral     SpO2 08/03/23 1641 95 %     Weight --      Height --      Head Circumference --      Peak Flow --      Pain  Score 08/03/23 1643 10     Pain Loc --      Pain Education --      Exclude from Growth Chart --    No data found.  Updated Vital Signs BP 105/71 (BP Location: Right Arm)   Pulse 80   Temp 98.3 F (36.8 C) (Oral)   Resp 20   SpO2 95%   Visual Acuity Right Eye Distance:   Left Eye Distance:   Bilateral Distance:    Right Eye Near:   Left Eye Near:    Bilateral Near:     Physical Exam Constitutional:      General: She is not in acute distress.    Appearance: Normal appearance. She is not ill-appearing, toxic-appearing or diaphoretic.  HENT:     Head: Normocephalic and atraumatic.     Mouth/Throat:     Pharynx: Oropharynx is clear.  Eyes:     Conjunctiva/sclera: Conjunctivae normal.  Cardiovascular:     Rate and Rhythm: Normal rate and regular rhythm.     Pulses: Normal pulses.     Heart sounds: Normal heart sounds.  Pulmonary:     Effort: Pulmonary effort is normal.     Breath sounds: Normal breath sounds.  Skin:    General: Skin is warm and dry.  Neurological:     Mental Status: She is alert.  Psychiatric:        Mood and Affect: Mood normal.      UC Treatments / Results  Labs (all labs ordered are listed, but only abnormal results are displayed) Labs Reviewed - No data to display  EKG   Radiology No results found.  Procedures Procedures (including critical care time)  Medications Ordered in UC Medications  alum & mag hydroxide-simeth (MAALOX/MYLANTA) 200-200-20 MG/5ML suspension 30 mL (30 mLs Oral Given 08/03/23 1701)  lidocaine  (XYLOCAINE ) 2 % viscous mouth solution 15 mL (  15 mLs Mouth/Throat Given 08/03/23 1701)    Initial Impression / Assessment and Plan / UC Course  I have reviewed the triage vital signs and the nursing notes.  Pertinent labs & imaging results that were available during my care of the patient were reviewed by me and considered in my medical decision making (see chart for details).     GERD-patient having severe GERD  symptoms.  Given a GI cocktail today here in clinic which did relieve her symptoms.  I am concerned that the Prolia  that she started a year ago has been the cause of this reflux due to this being a potential side effect.  The problem worsened after her last injection back in April.  Recommend she speak with her doctor about this.  For now we will stop the omeprazole  and start Protonix  daily.  She can take Tums in between if needed for flareups.  Recommend eating small meals and not laying down after eating.  Stay away from food triggers. Final Clinical Impressions(s) / UC Diagnoses   Final diagnoses:  Gastroesophageal reflux disease, unspecified whether esophagitis present     Discharge Instructions      Complaint that your symptoms are related to GERD.  I am switching you to Protonix  to see if this will help more.  Stop the omeprazole . Make sure you are following a GERD diet and eating small meals.  Do not lay down after eating meals You can take tums as needed  As we spoke about Prolia  could be a cause of this problem Follow-up with your doctor on Monday as planned for further management    ED Prescriptions     Medication Sig Dispense Auth. Provider   pantoprazole  (PROTONIX ) 40 MG tablet Take 1 tablet (40 mg total) by mouth daily. 30 tablet Adah Wilbert LABOR, FNP      PDMP not reviewed this encounter.   Adah Wilbert LABOR, FNP 08/04/23 [phone]

## 2023-08-03 NOTE — ED Triage Notes (Signed)
 States hx of bronchiectasis. Has been having heartburn symptoms for over a year. States this episode started yesterday and worsened this morning. Reports burping a lot and feels as if food is stuck in lower throat. States severe pain with her belching today as if the burp is too big for the space. States chronic cough with these symptoms.

## 2023-08-03 NOTE — ED Notes (Signed)
 Patient reporting improvement of her symptoms following GI cocktail.

## 2023-08-03 NOTE — Discharge Instructions (Addendum)
 Complaint that your symptoms are related to GERD.  I am switching you to Protonix  to see if this will help more.  Stop the omeprazole . Make sure you are following a GERD diet and eating small meals.  Do not lay down after eating meals You can take tums as needed  As we spoke about Prolia  could be a cause of this problem Follow-up with your doctor on Monday as planned for further management

## 2023-08-06 ENCOUNTER — Ambulatory Visit (INDEPENDENT_AMBULATORY_CARE_PROVIDER_SITE_OTHER): Admitting: Family Medicine

## 2023-08-06 ENCOUNTER — Encounter: Payer: Self-pay | Admitting: Family Medicine

## 2023-08-06 VITALS — BP 108/58 | HR 81 | Temp 98.0°F | Ht 62.0 in | Wt 189.0 lb

## 2023-08-06 DIAGNOSIS — I152 Hypertension secondary to endocrine disorders: Secondary | ICD-10-CM | POA: Diagnosis not present

## 2023-08-06 DIAGNOSIS — J4550 Severe persistent asthma, uncomplicated: Secondary | ICD-10-CM | POA: Diagnosis not present

## 2023-08-06 DIAGNOSIS — K219 Gastro-esophageal reflux disease without esophagitis: Secondary | ICD-10-CM | POA: Diagnosis not present

## 2023-08-06 DIAGNOSIS — E1159 Type 2 diabetes mellitus with other circulatory complications: Secondary | ICD-10-CM

## 2023-08-06 DIAGNOSIS — Z6834 Body mass index (BMI) 34.0-34.9, adult: Secondary | ICD-10-CM

## 2023-08-06 DIAGNOSIS — R5383 Other fatigue: Secondary | ICD-10-CM

## 2023-08-06 DIAGNOSIS — N1832 Chronic kidney disease, stage 3b: Secondary | ICD-10-CM | POA: Diagnosis not present

## 2023-08-06 DIAGNOSIS — G4733 Obstructive sleep apnea (adult) (pediatric): Secondary | ICD-10-CM | POA: Diagnosis not present

## 2023-08-06 DIAGNOSIS — E6609 Other obesity due to excess calories: Secondary | ICD-10-CM | POA: Diagnosis not present

## 2023-08-06 DIAGNOSIS — E66811 Obesity, class 1: Secondary | ICD-10-CM

## 2023-08-06 DIAGNOSIS — E782 Mixed hyperlipidemia: Secondary | ICD-10-CM | POA: Diagnosis not present

## 2023-08-06 DIAGNOSIS — J4540 Moderate persistent asthma, uncomplicated: Secondary | ICD-10-CM

## 2023-08-06 DIAGNOSIS — R8290 Unspecified abnormal findings in urine: Secondary | ICD-10-CM | POA: Diagnosis not present

## 2023-08-06 LAB — POCT URINALYSIS DIP (CLINITEK)
Bilirubin, UA: NEGATIVE
Blood, UA: NEGATIVE
Glucose, UA: NEGATIVE mg/dL
Ketones, POC UA: NEGATIVE mg/dL
Leukocytes, UA: NEGATIVE
Nitrite, UA: NEGATIVE
POC PROTEIN,UA: NEGATIVE
Spec Grav, UA: 1.01 (ref 1.010–1.025)
Urobilinogen, UA: 0.2 U/dL
pH, UA: 6 (ref 5.0–8.0)

## 2023-08-06 MED ORDER — DILTIAZEM HCL ER COATED BEADS 180 MG PO CP24
180.0000 mg | ORAL_CAPSULE | Freq: Every day | ORAL | 0 refills | Status: DC
Start: 2023-08-06 — End: 2023-11-13

## 2023-08-06 MED ORDER — OLMESARTAN MEDOXOMIL 5 MG PO TABS: 5.0000 mg | ORAL_TABLET | Freq: Every day | ORAL | 0 refills | Status: DC

## 2023-08-06 NOTE — Progress Notes (Unsigned)
 Subjective:  Patient ID: Diana Hendrix, female    DOB: 18-Dec-1945  Age: 78 y.o. MRN: [id number]  Chief Complaint  Patient presents with   Medical Management of Chronic Issues   Discussed the use of AI scribe software for clinical note transcription with the patient, who gave verbal consent to proceed.  History of Present Illness   Diana Hendrix is a 78 year old female who presents with persistent throat discomfort and fatigue.  Oropharyngeal discomfort and globus sensation - Persistent sensation of a 'lump in the throat' intermittently since pneumonia in April 2025 - Significant worsening of throat discomfort last Thursday and Friday, leading to urgent care visit - Medication changed from omeprazole  to Protonix  with some improvement in symptoms - Continued burping and throat discomfort - No dysphagia, nausea, or vomiting - Sensation of wanting to vomit without emesis  Respiratory symptoms - Asthma with ongoing coughing and congestion - Currently receiving Dupixent  injections every two weeks; initial significant relief, but diminished effect over time - Uses Stiolto and occasionally albuterol ; no albuterol  use in the past week - Continues Flonase  nasal spray and saline rinses  Fatigue and exercise tolerance - Fatigue and lack of energy - Attends water exercise classes at the [facility] three times a week - Feels extremely tired after exercise. Unable to take naps. - Improved sleep recently with Zolpidem  - No depression  Urinary discoloration - Green tint to urine since starting diltiazem   Diabetes: Complications: hypertension, hyperlipidemia. Glucose checking: She does not check her blood sugar. Hypoglycemia: no Most recent A1C:6.0 Current medications: No medications. Last Eye Exam: UTD Foot checks: daily.   Hypertension: furosemide  20 mg once daily. Olmesartan  20 mg daily   Hyperlipidemia: Takes Rosuvastatin  40 mg daily, lovaza  2 gm oral twice daily.      OSA: Patient on CPAP. Compliant with nightly use.    Depression: Celexa  10 mg daily.      08/06/2023    2:37 PM 05/23/2023    9:03 AM 05/03/2023    8:51 AM 03/20/2023    4:15 PM 12/20/2022    8:36 AM  Depression screen PHQ 2/9  Decreased Interest 1 0 0 0 0  Down, Depressed, Hopeless 0 0 0 0 0  PHQ - 2 Score 1 0 0 0 0  Altered sleeping 1  0 0 0  Tired, decreased energy 0  0 0 0  Change in appetite 0  0 0 0  Feeling bad or failure about yourself  0  0 0 0  Trouble concentrating 0  0 0 0  Moving slowly or fidgety/restless 0  0 0 0  Suicidal thoughts 0  0 0 0  PHQ-9 Score 2  0 0 0  Difficult doing work/chores Not difficult at all Not difficult at all Not difficult at all Not difficult at all Not difficult at all        06/06/2023    2:11 PM  Fall Risk   Falls in the past year? 0  Number falls in past yr: 0  Injury with Fall? 0    Patient Care Team: Sherre Clapper, MD as PCP - General (Family Medicine) Waylan Cain, MD as Consulting Physician (Ophthalmology) Monetta Redell PARAS, MD as Consulting Physician (Cardiology) Charlanne Groom, MD as Consulting Physician (Gastroenterology) Hunsucker, Donnice SAUNDERS, MD as Consulting Physician (Pulmonary Disease)   Review of Systems  Constitutional:  Negative for chills, fatigue and fever.  HENT:  Negative for congestion, ear pain, rhinorrhea and sore throat.  Respiratory:  Negative for cough and shortness of breath.   Cardiovascular:  Negative for chest pain.  Gastrointestinal:  Negative for abdominal pain, constipation, diarrhea, nausea and vomiting.       Burping, Indigestion  Genitourinary:  Negative for dysuria and urgency.  Musculoskeletal:  Negative for back pain and myalgias.  Neurological:  Negative for dizziness, weakness, light-headedness and headaches.  Psychiatric/Behavioral:  Negative for dysphoric mood. The patient is not nervous/anxious.     Current Outpatient Medications on File Prior to Visit  Medication Sig Dispense Refill    albuterol  (VENTOLIN  HFA) 108 (90 Base) MCG/ACT inhaler Inhale 2 puffs into the lungs every 6 (six) hours as needed for wheezing or shortness of breath. 16 g 3   Cholecalciferol (VITAMIN D3) 50 MCG (2000 UT) TABS Take 1 tablet by mouth daily.     citalopram  (CELEXA ) 10 MG tablet TAKE 1 TABLET(10 MG) BY MOUTH DAILY 90 tablet 1   Dupilumab  (DUPIXENT ) 300 MG/2ML SOAJ Inject 300 mg into the skin every 14 (fourteen) days. 12 mL 1   fluticasone  (FLONASE ) 50 MCG/ACT nasal spray Place 2 sprays into both nostrils daily. 16 g 6   furosemide  (LASIX ) 20 MG tablet Take 1 tablet (20 mg total) by mouth daily. Patient needs to make an appointment for further refills. 1st attempt. 90 tablet 3   loratadine  (CLARITIN ) 10 MG tablet Take 1 tablet (10 mg total) by mouth daily. 30 tablet 11   NON FORMULARY CPAP at bedtime     omega-3 acid ethyl esters (LOVAZA ) 1 g capsule TAKE 2 CAPSULES BY MOUTH TWICE DAILY 360 capsule 2   pantoprazole  (PROTONIX ) 40 MG tablet Take 1 tablet (40 mg total) by mouth daily. 30 tablet 0   Polyethyl Glycol-Propyl Glycol (SYSTANE OP) Place 1 drop into both eyes 4 (four) times daily.     polyethylene glycol (MIRALAX  / GLYCOLAX ) 17 g packet Take 17 g by mouth daily.     rosuvastatin  (CRESTOR ) 40 MG tablet TAKE 1 TABLET(40 MG) BY MOUTH DAILY 90 tablet 1   Tiotropium Bromide-Olodaterol (STIOLTO RESPIMAT ) 2.5-2.5 MCG/ACT AERS Inhale 2 puffs into the lungs daily. 1 each 11   zolpidem  (AMBIEN ) 5 MG tablet Take 1 tablet (5 mg total) by mouth at bedtime as needed for sleep. 30 tablet 5   Current Facility-Administered Medications on File Prior to Visit  Medication Dose Route Frequency Provider Last Rate Last Admin   [START ON 11/09/2023] denosumab  (PROLIA ) injection 60 mg  60 mg Subcutaneous Once Sherre Clapper, MD       Past Medical History:  Diagnosis Date   Cancer [facility])    Chronic right flank pain 12/04/2022   Diabetes mellitus without complication (HCC)    Diastolic heart failure (HCC)     Echo EF 60/65% Heart monitor was normal   GERD (gastroesophageal reflux disease)    History of colon polyps    Hyperlipidemia    Hypertension    Macular edema, cystoid    OSA (obstructive sleep apnea) 09/17/2012   Osteopenia    Plantar fasciitis 09/09/2019   Sleep apnea    Syncope, vasovagal 04/16/2017   Past Surgical History:  Procedure Laterality Date   CESAREAN SECTION     COLONOSCOPY  10/29/2013   Colonic polyp status post polypectomy. Mild sigmoid diverticulosis. Small internal hemorrhoids   FOOT NEUROMA SURGERY     s/p surgery in both feet   FOOT SURGERY Bilateral    Per patient   MENISCUS REPAIR Left 09/13/2016   miniscus repair  right  Right 2019   SHOULDER SURGERY Left    arthroscopy.    TUBAL LIGATION      Family History  Problem Relation Age of Onset   Hyperlipidemia Mother    Heart disease Mother    Hypertension Mother    Atrial fibrillation Mother    CAD Mother    Congestive Heart Failure Mother    CVA Father    Hypertension Brother    Hypertension Son    Colon cancer Neg Hx    Esophageal cancer Neg Hx    Social History   Socioeconomic History   Marital status: Married    Spouse name: Engineer, production   Number of children: 2   Years of education: Not on file   Highest education level: Associate degree: occupational, Scientist, product/process development, or vocational program  Occupational History   Not on file  Tobacco Use   Smoking status: Never    Passive exposure: Past   Smokeless tobacco: Never  Vaping Use   Vaping status: Never Used  Substance and Sexual Activity   Alcohol use: No   Drug use: No   Sexual activity: Not Currently  Other Topics Concern   Not on file  Social History Narrative   Lives with husband   Right handed   Caffeine: 1 cup of tea and 1 pepsi in the AM   Social Drivers of Health   Financial Resource Strain: Low Risk  (04/18/2023)   Overall Financial Resource Strain (CARDIA)    Difficulty of Paying Living Expenses: Not hard at all  Food  Insecurity: No Food Insecurity (04/18/2023)   Hunger Vital Sign    Worried About Running Out of Food in the Last Year: Never true    Ran Out of Food in the Last Year: Never true  Transportation Needs: No Transportation Needs (04/18/2023)   PRAPARE - Administrator, Civil Service (Medical): No    Lack of Transportation (Non-Medical): No  Physical Activity: Sufficiently Active (04/18/2023)   Exercise Vital Sign    Days of Exercise per Week: 3 days    Minutes of Exercise per Session: 60 min  Stress: No Stress Concern Present (04/18/2023)   [employer] of Occupational Health - Occupational Stress Questionnaire    Feeling of Stress : Not at all  Social Connections: Unknown (04/18/2023)   Social Connection and Isolation Panel    Frequency of Communication with Friends and Family: More than three times a week    Frequency of Social Gatherings with Friends and Family: Patient declined    Attends Religious Services: Patient declined    Database administrator or Organizations: No    Attends Engineer, structural: Not on file    Marital Status: Married    Objective:  BP (!) 108/58   Pulse 81   Temp 98 F (36.7 C)   Ht 5' 2 (1.575 m)   Wt 189 lb (85.7 kg)   SpO2 98%   BMI 34.57 kg/m      08/06/2023    2:32 PM 08/03/2023    4:41 PM 06/06/2023    2:12 PM  BP/Weight  Systolic BP 108 105 110  Diastolic BP 58 71 70  Wt. (Lbs) 189  190.8  BMI 34.57 kg/m2  34.9 kg/m2    Physical Exam Vitals reviewed.  Constitutional:      Appearance: Normal appearance. She is obese.  Neck:     Vascular: No carotid bruit.  Cardiovascular:     Rate and  Rhythm: Normal rate and regular rhythm.     Pulses: Normal pulses.     Heart sounds: Normal heart sounds.  Pulmonary:     Effort: Pulmonary effort is normal. No respiratory distress.     Breath sounds: Normal breath sounds.  Abdominal:     General: Abdomen is flat. Bowel sounds are normal.     Palpations: Abdomen is soft.      Tenderness: There is no abdominal tenderness.  Neurological:     Mental Status: She is alert and oriented to person, place, and time.  Psychiatric:        Mood and Affect: Mood normal.        Behavior: Behavior normal.      Diabetic foot exam was performed with the following findings:   No deformities, ulcerations, or other skin breakdown Normal sensation of 10g monofilament Intact posterior tibialis and dorsalis pedis pulses      Lab Results  Component Value Date   WBC 6.5 08/02/2023   HGB 13.8 08/02/2023   HCT 42.2 08/02/2023   PLT 226 08/02/2023   GLUCOSE 102 (H) 08/02/2023   CHOL 136 08/02/2023   TRIG 137 08/02/2023   HDL 45 08/02/2023   LDLCALC 67 08/02/2023   ALT 18 08/02/2023   AST 25 08/02/2023   NA 138 08/02/2023   K 4.5 08/02/2023   CL 101 08/02/2023   CREATININE 1.32 (H) 08/02/2023   BUN 16 08/02/2023   CO2 21 08/02/2023   TSH 1.810 02/09/2021   INR 1.1 11/09/2021   HGBA1C 6.0 (H) 08/02/2023      Assessment & Plan:  Hypertension associated with diabetes (HCC) Assessment & Plan: Improved HbA1c to 6.0% with dietary changes and reduced intake.  Hypertension: bp may be running low causing fatigue.  I am concerned you may feel poorly due to low normal blood pressure.  Recommend decrease olmesartan  10 mg daily.  Check blood pressure and pulse daily. Continue furosemide  20 mg daily and diltiazem  180 mg daily.  . Patient concerned green urine possibly caused by diltiazem . - UA normal  Orders: -     Olmesartan  Medoxomil; Take 1 tablet (5 mg total) by mouth daily.  Dispense: 30 tablet; Refill: 0 -     dilTIAZem  HCl ER Coated Beads; Take 1 capsule (180 mg total) by mouth daily.  Dispense: 90 capsule; Refill: 0  Other fatigue Assessment & Plan: Chronic fatigue with no clear etiology. No depression or sleep disturbances. Fatigue post-exercise. Decrease olmesartan  to 10 mg daily.    Gastroesophageal reflux disease without esophagitis Assessment &  Plan: Chronic GERD with globus sensation and eructation. Improved with pantoprazole  and dietary changes. No dysphagia. - Continue pantoprazole  as prescribed. - Avoid heavy meals and consume smaller portions.   Abnormal urine -     POCT URINALYSIS DIP (CLINITEK)  Severe persistent asthma without complication Assessment & Plan: Chronic asthma with congestion and cough. - Follow up with Dr. Annella on September 4th. - Continue Dupixent  injections every two weeks, Stiolto and occasionally albuterol ; no albuterol  use in the past week - Continues Flonase  nasal spray and saline rinses   Stage 3b chronic kidney disease (HCC) Assessment & Plan: stable   Class 1 obesity due to excess calories with serious comorbidity and body mass index (BMI) of 34.0 to 34.9 in adult Assessment & Plan: Recommend continue to work on eating healthy diet and exercise.    OSA (obstructive sleep apnea) Assessment & Plan: Compliant with cpap. Benefiting.    Mixed hyperlipidemia  Assessment & Plan: Well controlled.  Continue Rosuvastatin  40 mg daily.  Continue lovaza  1 gm 2 capsules twice daily.  Continue to work on eating a healthy diet and exercise.        Meds ordered this encounter  Medications   olmesartan  (BENICAR ) 5 MG tablet    Sig: Take 1 tablet (5 mg total) by mouth daily.    Dispense:  30 tablet    Refill:  0   diltiazem  (CARDIZEM  CD) 180 MG 24 hr capsule    Sig: Take 1 capsule (180 mg total) by mouth daily.    Dispense:  90 capsule    Refill:  0    Orders Placed This Encounter  Procedures   POCT URINALYSIS DIP (CLINITEK)     Follow-up: Return in about 2 weeks (around 08/20/2023) for brady htn follow up. SABRA LILLETTE Kato I Leal-Borjas,acting as a scribe for Abigail Free, MD.,have documented all relevant documentation on the behalf of Abigail Free, MD,as directed by  Abigail Free, MD while in the presence of Abigail Free, MD.    An After Visit Summary was printed and given to the  patient.  I attest that I have reviewed this visit and agree with the plan scribed by my staff.   Abigail Free, MD [facility] [phone]

## 2023-08-06 NOTE — Patient Instructions (Addendum)
 I am concerned you may feel poorly due to low normal blood pressure.  Recommend decrease olmesartan  5 mg daily.  Check blood pressure and pulse daily.

## 2023-08-08 ENCOUNTER — Encounter: Payer: Self-pay | Admitting: Family Medicine

## 2023-08-08 DIAGNOSIS — R5383 Other fatigue: Secondary | ICD-10-CM | POA: Insufficient documentation

## 2023-08-08 NOTE — Assessment & Plan Note (Signed)
 Improved HbA1c to 6.0% with dietary changes and reduced intake.  Hypertension: bp may be running low causing fatigue.  I am concerned you may feel poorly due to low normal blood pressure.  Recommend decrease olmesartan  10 mg daily.  Check blood pressure and pulse daily. Continue furosemide  20 mg daily and diltiazem  180 mg daily.  . Patient concerned green urine possibly caused by diltiazem . - UA normal

## 2023-08-08 NOTE — Assessment & Plan Note (Signed)
 Chronic asthma with congestion and cough. - Follow up with Dr. Annella on September 4th. - Continue Dupixent  injections every two weeks, Stiolto and occasionally albuterol ; no albuterol  use in the past week - Continues Flonase  nasal spray and saline rinses

## 2023-08-08 NOTE — Assessment & Plan Note (Signed)
 Chronic fatigue with no clear etiology. No depression or sleep disturbances. Fatigue post-exercise. Decrease olmesartan  to 10 mg daily.

## 2023-08-08 NOTE — Addendum Note (Signed)
 Addended by: DAYNE SHERRY RAMAN on: 08/08/2023 10:07 AM   Modules accepted: Level of Service

## 2023-08-08 NOTE — Assessment & Plan Note (Signed)
 Chronic GERD with globus sensation and eructation. Improved with pantoprazole  and dietary changes. No dysphagia. - Continue pantoprazole  as prescribed. - Avoid heavy meals and consume smaller portions.

## 2023-08-09 ENCOUNTER — Encounter: Payer: Self-pay | Admitting: Family Medicine

## 2023-08-09 NOTE — Assessment & Plan Note (Signed)
Compliant with cpap. Benefiting.

## 2023-08-09 NOTE — Assessment & Plan Note (Signed)
 Well controlled.  Continue Rosuvastatin 40 mg daily.  Continue lovaza 1 gm 2 capsules twice daily.  Continue to work on eating a healthy diet and exercise.

## 2023-08-09 NOTE — Assessment & Plan Note (Signed)
 Recommend continue to work on eating healthy diet and exercise.

## 2023-08-09 NOTE — Assessment & Plan Note (Signed)
 Chronic asthma with congestion and cough. - Follow up with Dr. Annella on September 4th. - Continue Dupixent  injections every two weeks, Stiolto and occasionally albuterol ; no albuterol  use in the past week - Continues Flonase  nasal spray and saline rinses

## 2023-08-09 NOTE — Assessment & Plan Note (Signed)
 stable

## 2023-08-22 ENCOUNTER — Ambulatory Visit: Admitting: Physician Assistant

## 2023-08-22 ENCOUNTER — Encounter: Payer: Self-pay | Admitting: Physician Assistant

## 2023-08-22 VITALS — BP 138/88 | HR 70 | Temp 97.8°F | Ht 62.0 in | Wt 188.0 lb

## 2023-08-22 DIAGNOSIS — R09A2 Foreign body sensation, throat: Secondary | ICD-10-CM | POA: Diagnosis not present

## 2023-08-22 DIAGNOSIS — I10 Essential (primary) hypertension: Secondary | ICD-10-CM

## 2023-08-22 DIAGNOSIS — K219 Gastro-esophageal reflux disease without esophagitis: Secondary | ICD-10-CM | POA: Diagnosis not present

## 2023-08-22 MED ORDER — PANTOPRAZOLE SODIUM 40 MG PO TBEC: 40.0000 mg | DELAYED_RELEASE_TABLET | Freq: Every day | ORAL | 3 refills | Status: DC

## 2023-08-22 MED ORDER — MONTELUKAST SODIUM 10 MG PO TABS: 10.0000 mg | ORAL_TABLET | Freq: Every day | ORAL | 3 refills | Status: AC

## 2023-08-22 MED ORDER — FAMOTIDINE 40 MG PO TABS: 40.0000 mg | ORAL_TABLET | Freq: Every day | ORAL | 1 refills | Status: DC

## 2023-08-22 NOTE — Patient Instructions (Signed)
 VISIT SUMMARY:  Today, we discussed your ongoing issues with blood pressure and acid reflux symptoms. You mentioned a persistent sensation in your throat that hasn't improved with Protonix , and we reviewed your home blood pressure readings and medication adjustments.  YOUR PLAN:  -GLOBUS SENSATION: Globus sensation is the feeling of having something stuck in your throat. Despite taking Protonix , your symptoms persist, so we will refer you to gastroenterology for further evaluation and a possible endoscopy. Additionally, we will add famotidine  40 mg in the evening to your current morning Protonix  regimen.  -HYPERTENSION: Hypertension is high blood pressure. Your home readings are in the 120s/80s range, and you have been adjusting your Losartan  dose. We will trial a 5 mg dose to see if it controls your blood pressure. If it is not sufficient, you can take two 5 mg tablets to make a 10 mg dose. You can also use a pill cutter to split your 20 mg tablets if needed. Please notify us  if you require a new 20 mg prescription.  INSTRUCTIONS:  Please follow up with gastroenterology for an evaluation and potential endoscopy. Continue monitoring your blood pressure at home and adjust your Losartan  dose as discussed. Notify us  if you need a new prescription for 20 mg Losartan .

## 2023-08-22 NOTE — Progress Notes (Signed)
 Subjective:  Patient ID: Diana Hendrix, female    DOB: November 25, 1945  Age: 78 y.o. MRN: [id number]  Chief Complaint  Patient presents with   Medical Management of Chronic Issues   Discussed the use of AI scribe software for clinical note transcription with the patient, who gave verbal consent to proceed.  History of Present Illness   Diana Hendrix DONNY is a 78 year old female who presents with persistent throat discomfort and fatigue.  Oropharyngeal discomfort and globus sensation - Persistent sensation of a 'lump in the throat' intermittently since pneumonia in April 2025 - Significant worsening of throat discomfort last Thursday and Friday, leading to urgent care visit - Medication changed from omeprazole  to Protonix  with some improvement in symptoms - Continued burping and throat discomfort - No dysphagia, nausea, or vomiting - Sensation of wanting to vomit without emesis  Also decreased olmesartan  from 20 mg to 10 mg.      08/06/2023    2:37 PM 05/23/2023    9:03 AM 05/03/2023    8:51 AM 03/20/2023    4:15 PM 12/20/2022    8:36 AM  Depression screen PHQ 2/9  Decreased Interest 1 0 0 0 0  Down, Depressed, Hopeless 0 0 0 0 0  PHQ - 2 Score 1 0 0 0 0  Altered sleeping 1  0 0 0  Tired, decreased energy 0  0 0 0  Change in appetite 0  0 0 0  Feeling bad or failure about yourself  0  0 0 0  Trouble concentrating 0  0 0 0  Moving slowly or fidgety/restless 0  0 0 0  Suicidal thoughts 0  0 0 0  PHQ-9 Score 2  0 0 0  Difficult doing work/chores Not difficult at all Not difficult at all Not difficult at all Not difficult at all Not difficult at all        06/06/2023    2:11 PM  Fall Risk   Falls in the past year? 0  Number falls in past yr: 0  Injury with Fall? 0    Patient Care Team: Sherre Clapper, MD as PCP - General (Family Medicine) Waylan Cain, MD as Consulting Physician (Ophthalmology) Monetta Redell PARAS, MD as Consulting Physician (Cardiology) Charlanne Groom,  MD as Consulting Physician (Gastroenterology) Hunsucker, Donnice SAUNDERS, MD as Consulting Physician (Pulmonary Disease)   Review of Systems  Constitutional:  Negative for chills, fatigue and fever.  HENT:  Negative for congestion, ear pain, rhinorrhea and sore throat.        Lump in throat  Respiratory:  Negative for cough and shortness of breath.   Cardiovascular:  Negative for chest pain.  Gastrointestinal:  Negative for abdominal pain, constipation, diarrhea, nausea and vomiting.       Burping, Indigestion  Genitourinary:  Negative for dysuria and urgency.  Musculoskeletal:  Negative for back pain and myalgias.  Neurological:  Negative for dizziness, weakness, light-headedness and headaches.  Psychiatric/Behavioral:  Negative for dysphoric mood. The patient is not nervous/anxious.     Current Outpatient Medications on File Prior to Visit  Medication Sig Dispense Refill   albuterol  (VENTOLIN  HFA) 108 (90 Base) MCG/ACT inhaler Inhale 2 puffs into the lungs every 6 (six) hours as needed for wheezing or shortness of breath. 16 g 3   Cholecalciferol (VITAMIN D3) 50 MCG (2000 UT) TABS Take 1 tablet by mouth daily.     citalopram  (CELEXA ) 10 MG tablet TAKE 1 TABLET(10 MG) BY MOUTH DAILY 90  tablet 1   diltiazem  (CARDIZEM  CD) 180 MG 24 hr capsule Take 1 capsule (180 mg total) by mouth daily. 90 capsule 0   Dupilumab  (DUPIXENT ) 300 MG/2ML SOAJ Inject 300 mg into the skin every 14 (fourteen) days. 12 mL 1   fluticasone  (FLONASE ) 50 MCG/ACT nasal spray Place 2 sprays into both nostrils daily. 16 g 6   furosemide  (LASIX ) 20 MG tablet Take 1 tablet (20 mg total) by mouth daily. Patient needs to make an appointment for further refills. 1st attempt. 90 tablet 3   NON FORMULARY CPAP at bedtime     olmesartan  (BENICAR ) 20 MG tablet Take 10 mg by mouth daily.     omega-3 acid ethyl esters (LOVAZA ) 1 g capsule TAKE 2 CAPSULES BY MOUTH TWICE DAILY 360 capsule 2   Polyethyl Glycol-Propyl Glycol (SYSTANE OP)  Place 1 drop into both eyes 4 (four) times daily.     polyethylene glycol (MIRALAX  / GLYCOLAX ) 17 g packet Take 17 g by mouth daily.     rosuvastatin  (CRESTOR ) 40 MG tablet TAKE 1 TABLET(40 MG) BY MOUTH DAILY 90 tablet 1   Tiotropium Bromide-Olodaterol (STIOLTO RESPIMAT ) 2.5-2.5 MCG/ACT AERS Inhale 2 puffs into the lungs daily. 1 each 11   zolpidem  (AMBIEN ) 5 MG tablet Take 1 tablet (5 mg total) by mouth at bedtime as needed for sleep. 30 tablet 5   Current Facility-Administered Medications on File Prior to Visit  Medication Dose Route Frequency Provider Last Rate Last Admin   [START ON 11/09/2023] denosumab  (PROLIA ) injection 60 mg  60 mg Subcutaneous Once Sherre Clapper, MD       Past Medical History:  Diagnosis Date   Cancer [facility])    Chronic right flank pain 12/04/2022   Diabetes mellitus without complication (HCC)    Diastolic heart failure (HCC)    Echo EF 60/65% Heart monitor was normal   GERD (gastroesophageal reflux disease)    History of colon polyps    Hyperlipidemia    Hypertension    Macular edema, cystoid    OSA (obstructive sleep apnea) 09/17/2012   Osteopenia    Plantar fasciitis 09/09/2019   Sleep apnea    Syncope, vasovagal 04/16/2017   Past Surgical History:  Procedure Laterality Date   CESAREAN SECTION     COLONOSCOPY  10/29/2013   Colonic polyp status post polypectomy. Mild sigmoid diverticulosis. Small internal hemorrhoids   FOOT NEUROMA SURGERY     s/p surgery in both feet   FOOT SURGERY Bilateral    Per patient   MENISCUS REPAIR Left 09/13/2016   miniscus repair right  Right 2019   SHOULDER SURGERY Left    arthroscopy.    TUBAL LIGATION      Family History  Problem Relation Age of Onset   Hyperlipidemia Mother    Heart disease Mother    Hypertension Mother    Atrial fibrillation Mother    CAD Mother    Congestive Heart Failure Mother    CVA Father    Hypertension Brother    Hypertension Son    Colon cancer Neg Hx    Esophageal cancer Neg Hx     Social History   Socioeconomic History   Marital status: Married    Spouse name: Diana Hendrix   Number of children: 2   Years of education: Not on file   Highest education level: Associate degree: occupational, Scientist, product/process development, or vocational program  Occupational History   Not on file  Tobacco Use   Smoking status: Never  Passive exposure: Past   Smokeless tobacco: Never  Vaping Use   Vaping status: Never Used  Substance and Sexual Activity   Alcohol use: No   Drug use: No   Sexual activity: Not Currently  Other Topics Concern   Not on file  Social History Narrative   Lives with husband   Right handed   Caffeine: 1 cup of tea and 1 pepsi in the AM   Social Drivers of Health   Financial Resource Strain: Low Risk  (04/18/2023)   Overall Financial Resource Strain (CARDIA)    Difficulty of Paying Living Expenses: Not hard at all  Food Insecurity: No Food Insecurity (04/18/2023)   Hunger Vital Sign    Worried About Running Out of Food in the Last Year: Never true    Ran Out of Food in the Last Year: Never true  Transportation Needs: No Transportation Needs (04/18/2023)   PRAPARE - Administrator, Civil Service (Medical): No    Lack of Transportation (Non-Medical): No  Physical Activity: Sufficiently Active (04/18/2023)   Exercise Vital Sign    Days of Exercise per Week: 3 days    Minutes of Exercise per Session: 60 min  Stress: No Stress Concern Present (04/18/2023)   [employer] of Occupational Health - Occupational Stress Questionnaire    Feeling of Stress : Not at all  Social Connections: Unknown (04/18/2023)   Social Connection and Isolation Panel    Frequency of Communication with Friends and Family: More than three times a week    Frequency of Social Gatherings with Friends and Family: Patient declined    Attends Religious Services: Patient declined    Database administrator or Organizations: No    Attends Engineer, structural: Not on file    Marital  Status: Married    Objective:  BP 138/88 (BP Location: Right Arm, Patient Position: Sitting)   Pulse 70   Temp 97.8 F (36.6 C) (Temporal)   Ht 5' 2 (1.575 m)   Wt 188 lb (85.3 kg)   SpO2 96%   BMI 34.39 kg/m      08/22/2023   10:16 AM 08/06/2023    2:32 PM 08/03/2023    4:41 PM  BP/Weight  Systolic BP 138 108 105  Diastolic BP 88 58 71  Wt. (Lbs) 188 189   BMI 34.39 kg/m2 34.57 kg/m2     Physical Exam Vitals reviewed.  Constitutional:      Appearance: Normal appearance. She is obese.  Neck:     Vascular: No carotid bruit.  Cardiovascular:     Rate and Rhythm: Normal rate and regular rhythm.     Pulses: Normal pulses.     Heart sounds: Normal heart sounds.  Pulmonary:     Effort: Pulmonary effort is normal. No respiratory distress.     Breath sounds: Normal breath sounds.  Abdominal:     General: Abdomen is flat. Bowel sounds are normal.     Palpations: Abdomen is soft.     Tenderness: There is no abdominal tenderness.  Neurological:     Mental Status: She is alert and oriented to person, place, and time.  Psychiatric:        Mood and Affect: Mood normal.        Behavior: Behavior normal.      Diabetic foot exam was performed with the following findings:   No data filed      Lab Results  Component Value Date   WBC 6.5  08/02/2023   HGB 13.8 08/02/2023   HCT 42.2 08/02/2023   PLT 226 08/02/2023   GLUCOSE 102 (H) 08/02/2023   CHOL 136 08/02/2023   TRIG 137 08/02/2023   HDL 45 08/02/2023   LDLCALC 67 08/02/2023   ALT 18 08/02/2023   AST 25 08/02/2023   NA 138 08/02/2023   K 4.5 08/02/2023   CL 101 08/02/2023   CREATININE 1.32 (H) 08/02/2023   BUN 16 08/02/2023   CO2 21 08/02/2023   TSH 1.810 02/09/2021   INR 1.1 11/09/2021   HGBA1C 6.0 (H) 08/02/2023      Assessment & Plan:  Gastroesophageal reflux disease without esophagitis Assessment & Plan: Controlled Continue to monitor symptoms Continue taking Protonix  40mg  Will adjust treatments  depending on symptoms  Orders: -     Pantoprazole  Sodium; Take 1 tablet (40 mg total) by mouth daily.  Dispense: 90 tablet; Refill: 3 -     Famotidine ; Take 1 tablet (40 mg total) by mouth daily.  Dispense: 90 tablet; Refill: 1 -     Ambulatory referral to Gastroenterology  Globus sensation Assessment & Plan: Persistent sensation in throat despite Protonix . Possible esophageal condition. Gastroenterology evaluation needed. - Refer to gastroenterology for evaluation and potential endoscopy. - Add famotidine  40 mg in the evening with morning Protonix .   Essential hypertension, benign Assessment & Plan: Home blood pressure 120s/80s. Adjusting losartan  between 10 mg and 20 mg. Evaluate adequacy of 5 mg dosage. - Trial losartan  5 mg to assess control. - If 5 mg insufficient, take two 5 mg tablets for 10 mg. - Use pill cutter to split 20 mg tablets if needed. - Notify provider if 20 mg prescription required.     BP Readings from Last 3 Encounters:  08/22/23 138/88  08/06/23 (!) 108/58  08/03/23 105/71      Other orders -     Montelukast  Sodium; Take 1 tablet (10 mg total) by mouth at bedtime.  Dispense: 90 tablet; Refill: 3       Meds ordered this encounter  Medications   montelukast  (SINGULAIR ) 10 MG tablet    Sig: Take 1 tablet (10 mg total) by mouth at bedtime.    Dispense:  90 tablet    Refill:  3   pantoprazole  (PROTONIX ) 40 MG tablet    Sig: Take 1 tablet (40 mg total) by mouth daily.    Dispense:  90 tablet    Refill:  3   famotidine  (PEPCID ) 40 MG tablet    Sig: Take 1 tablet (40 mg total) by mouth daily.    Dispense:  90 tablet    Refill:  1    Orders Placed This Encounter  Procedures   Ambulatory referral to Gastroenterology     Follow-up: No follow-ups on file.  I,Marla I Leal-Borjas,acting as a scribe for [employer], PA.,have documented all relevant documentation on the behalf of Nola Angles, PA,as directed by  Nola Angles, PA while in the presence  of Nola Angles, [place].    An After Visit Summary was printed and given to the patient.  I attest that I have reviewed this visit and agree with the plan scribed by my staff.   Nola Angles, [place] [facility] [phone]

## 2023-08-27 DIAGNOSIS — L820 Inflamed seborrheic keratosis: Secondary | ICD-10-CM | POA: Diagnosis not present

## 2023-08-28 ENCOUNTER — Encounter: Payer: Self-pay | Admitting: Gastroenterology

## 2023-08-28 ENCOUNTER — Encounter: Payer: Self-pay | Admitting: Physician Assistant

## 2023-08-29 DIAGNOSIS — R09A2 Foreign body sensation, throat: Secondary | ICD-10-CM | POA: Insufficient documentation

## 2023-08-29 NOTE — Assessment & Plan Note (Signed)
 Persistent sensation in throat despite Protonix . Possible esophageal condition. Gastroenterology evaluation needed. - Refer to gastroenterology for evaluation and potential endoscopy. - Add famotidine  40 mg in the evening with morning Protonix .

## 2023-08-29 NOTE — Assessment & Plan Note (Signed)
 Home blood pressure 120s/80s. Adjusting losartan  between 10 mg and 20 mg. Evaluate adequacy of 5 mg dosage. - Trial losartan  5 mg to assess control. - If 5 mg insufficient, take two 5 mg tablets for 10 mg. - Use pill cutter to split 20 mg tablets if needed. - Notify provider if 20 mg prescription required.     BP Readings from Last 3 Encounters:  08/22/23 138/88  08/06/23 (!) 108/58  08/03/23 105/71

## 2023-08-29 NOTE — Assessment & Plan Note (Signed)
 Controlled Continue to monitor symptoms Continue taking Protonix  40mg  Will adjust treatments depending on symptoms

## 2023-09-19 ENCOUNTER — Ambulatory Visit (INDEPENDENT_AMBULATORY_CARE_PROVIDER_SITE_OTHER): Admitting: Physician Assistant

## 2023-09-19 ENCOUNTER — Encounter: Payer: Self-pay | Admitting: Family Medicine

## 2023-09-19 VITALS — BP 124/70 | HR 84 | Temp 98.1°F | Ht 62.0 in | Wt 190.0 lb

## 2023-09-19 DIAGNOSIS — J4550 Severe persistent asthma, uncomplicated: Secondary | ICD-10-CM | POA: Diagnosis not present

## 2023-09-19 DIAGNOSIS — K219 Gastro-esophageal reflux disease without esophagitis: Secondary | ICD-10-CM

## 2023-09-19 DIAGNOSIS — B354 Tinea corporis: Secondary | ICD-10-CM

## 2023-09-19 MED ORDER — CLOTRIMAZOLE 1 % EX CREA: 1.0000 | TOPICAL_CREAM | Freq: Two times a day (BID) | CUTANEOUS | 0 refills | Status: DC

## 2023-09-19 NOTE — Assessment & Plan Note (Signed)
 Tinea corporis and rash of uncertain etiology involving trunk and axilla Rash on trunk, axilla, and waist likely tinea corporis. Lack of pruritus or burning suggests low probability of eczema or psoriasis. No recent changes in soaps, detergents, or medications. Rash may worsen with sweating and moisture retention. - Prescribed Lotrimin  cream for affected areas. - Advised keeping skin dry, using a blow dryer on cool if necessary after showering. - Instructed to report if rash does not improve or worsens.

## 2023-09-19 NOTE — Patient Instructions (Signed)
  VISIT SUMMARY: Today, you were seen for a widespread rash that began under your breast and has spread to your underarms and waist. You also mentioned a small red spot that appeared this morning. Additionally, we reviewed your history of acid reflux, back pain, and congestion. You have a follow-up appointment with a pulmonologist scheduled for tomorrow.  YOUR PLAN: -TINEA CORPORIS AND RASH OF UNCERTAIN ETIOLOGY: Tinea corporis is a fungal infection of the skin. You have a rash on your trunk, underarms, and waist that is likely due to this condition. You were prescribed an antifungal cream to apply to the affected areas. Please keep your skin dry, and you can use a blow dryer on a cool setting after showering. Report back if the rash does not improve or gets worse.  -GASTROESOPHAGEAL REFLUX DISEASE (GERD): GERD is a condition where stomach acid frequently flows back into the tube connecting your mouth and stomach. Your symptoms have improved with your current management, but you still occasionally feel a lump in your throat and burp. Continue monitoring your diet to manage these symptoms.  -CONGESTION, POSSIBLE RESPIRATORY INFECTION OR ALLERGIES: Your congestion may be related to allergies. You have a follow-up appointment with a pulmonologist scheduled for further assessment.  INSTRUCTIONS: Please follow up with the pulmonologist as scheduled tomorrow. Apply the antifungal cream as directed and keep the affected areas dry. Report back if the rash does not improve or worsens.                      Contains text generated by [employer]bridge.                                 Contains text generated by Abridge.

## 2023-09-19 NOTE — Assessment & Plan Note (Signed)
 Congestion, possible respiratory infection or allergies Congestion possibly related to prevalent allergies. - Scheduled follow-up with pulmonologist for further assessment. - Has follow up with pulmonology tomorrow

## 2023-09-19 NOTE — Assessment & Plan Note (Signed)
 GERD symptoms improved with current management, occasional globus sensation and eructation persist. She monitored dietary intake to manage symptoms. Continue taking Pepcid  40mg  and Protonix  40mg  as prescribed Has appointment with GI in October

## 2023-09-19 NOTE — Progress Notes (Signed)
 Acute Office Visit  Subjective:    Patient ID: Diana Hendrix, female    DOB: [birth year], 78 y.o.   MRN: [id number]  Chief Complaint  Patient presents with   Medical Management of Chronic Issues    HPI: Patient is in today for rash Discussed the use of AI scribe software for clinical note transcription with the patient, who gave verbal consent to proceed.  History of Present Illness Diana Hendrix DONNY is a 78 year old female who presents with a widespread rash.  The rash began approximately one week ago as a small lesion under her breast, which she initially attributed to irritation from her bra. It has since spread to her underarms and waist. The rash is neither itchy nor burning. She suspects it might be ringworm. There have been no recent changes in soaps, laundry detergents, or medications, except for a previous change to Pepcid .  This morning, she noticed a small red spot resembling a burst blood vessel and is unsure if it is related to the rash.  She has a history of acid reflux, which has improved. Occasionally, she experiences a sensation of a lump in her throat and burping, but these symptoms have decreased.  She has a history of back pain and spine issues. She also feels congested and has a follow-up appointment with a pulmonologist scheduled for tomorrow.  She notes a past reaction to a cortisone shot, which caused her to pass out, and she avoids NSAIDs like ibuprofen as advised. She does not recall specific reactions to glucosamine or Zyrtec.    Past Medical History:  Diagnosis Date   Cancer [facility])    Chronic right flank pain 12/04/2022   Diabetes mellitus without complication (HCC)    Diastolic heart failure (HCC)    Echo EF 60/65% Heart monitor was normal   GERD (gastroesophageal reflux disease)    History of colon polyps    Hyperlipidemia    Hypertension    Macular edema, cystoid    OSA (obstructive sleep apnea) 09/17/2012   Osteopenia    Plantar  fasciitis 09/09/2019   Sleep apnea    Syncope, vasovagal 04/16/2017    Past Surgical History:  Procedure Laterality Date   CESAREAN SECTION     COLONOSCOPY  10/29/2013   Colonic polyp status post polypectomy. Mild sigmoid diverticulosis. Small internal hemorrhoids   FOOT NEUROMA SURGERY     s/p surgery in both feet   FOOT SURGERY Bilateral    Per patient   MENISCUS REPAIR Left 09/13/2016   miniscus repair right  Right 2019   SHOULDER SURGERY Left    arthroscopy.    TUBAL LIGATION      Family History  Problem Relation Age of Onset   Hyperlipidemia Mother    Heart disease Mother    Hypertension Mother    Atrial fibrillation Mother    CAD Mother    Congestive Heart Failure Mother    CVA Father    Hypertension Brother    Hypertension Son    Colon cancer Neg Hx    Esophageal cancer Neg Hx     Social History   Socioeconomic History   Marital status: Married    Spouse name: Sofija Antwi   Number of children: 2   Years of education: Not on file   Highest education level: Associate degree: occupational, Scientist, product/process development, or vocational program  Occupational History   Not on file  Tobacco Use   Smoking status: Never    Passive exposure: Past  Smokeless tobacco: Never  Vaping Use   Vaping status: Never Used  Substance and Sexual Activity   Alcohol use: No   Drug use: No   Sexual activity: Not Currently  Other Topics Concern   Not on file  Social History Narrative   Lives with husband   Right handed   Caffeine: 1 cup of tea and 1 pepsi in the AM   Social Drivers of Health   Financial Resource Strain: Low Risk  (09/18/2023)   Overall Financial Resource Strain (CARDIA)    Difficulty of Paying Living Expenses: Not hard at all  Food Insecurity: No Food Insecurity (09/18/2023)   Hunger Vital Sign    Worried About Running Out of Food in the Last Year: Never true    Ran Out of Food in the Last Year: Never true  Transportation Needs: No Transportation Needs (09/18/2023)    PRAPARE - Administrator, Civil Service (Medical): No    Lack of Transportation (Non-Medical): No  Physical Activity: Sufficiently Active (09/18/2023)   Exercise Vital Sign    Days of Exercise per Week: 3 days    Minutes of Exercise per Session: 60 min  Stress: No Stress Concern Present (09/18/2023)   [employer] of Occupational Health - Occupational Stress Questionnaire    Feeling of Stress: Not at all  Social Connections: Moderately Integrated (09/18/2023)   Social Connection and Isolation Panel    Frequency of Communication with Friends and Family: More than three times a week    Frequency of Social Gatherings with Friends and Family: More than three times a week    Attends Religious Services: More than 4 times per year    Active Member of [employer] or Organizations: No    Attends Banker Meetings: Not on file    Marital Status: Married  Catering manager Violence: Not At Risk (05/23/2023)   Humiliation, Afraid, Rape, and Kick questionnaire    Fear of Current or Ex-Partner: No    Emotionally Abused: No    Physically Abused: No    Sexually Abused: No    Outpatient Medications Prior to Visit  Medication Sig Dispense Refill   albuterol  (VENTOLIN  HFA) 108 (90 Base) MCG/ACT inhaler Inhale 2 puffs into the lungs every 6 (six) hours as needed for wheezing or shortness of breath. 16 g 3   Cholecalciferol (VITAMIN D3) 50 MCG (2000 UT) TABS Take 1 tablet by mouth daily.     citalopram  (CELEXA ) 10 MG tablet TAKE 1 TABLET(10 MG) BY MOUTH DAILY 90 tablet 1   diltiazem  (CARDIZEM  CD) 180 MG 24 hr capsule Take 1 capsule (180 mg total) by mouth daily. 90 capsule 0   Dupilumab  (DUPIXENT ) 300 MG/2ML SOAJ Inject 300 mg into the skin every 14 (fourteen) days. 12 mL 1   famotidine  (PEPCID ) 40 MG tablet Take 1 tablet (40 mg total) by mouth daily. 90 tablet 1   fluticasone  (FLONASE ) 50 MCG/ACT nasal spray Place 2 sprays into both nostrils daily. 16 g 6   furosemide  (LASIX ) 20 MG tablet  Take 1 tablet (20 mg total) by mouth daily. Patient needs to make an appointment for further refills. 1st attempt. 90 tablet 3   montelukast  (SINGULAIR ) 10 MG tablet Take 1 tablet (10 mg total) by mouth at bedtime. 90 tablet 3   NON FORMULARY CPAP at bedtime     olmesartan  (BENICAR ) 20 MG tablet Take 10 mg by mouth daily.     omega-3 acid ethyl esters (LOVAZA ) 1 g capsule  TAKE 2 CAPSULES BY MOUTH TWICE DAILY 360 capsule 2   pantoprazole  (PROTONIX ) 40 MG tablet Take 1 tablet (40 mg total) by mouth daily. 90 tablet 3   Polyethyl Glycol-Propyl Glycol (SYSTANE OP) Place 1 drop into both eyes 4 (four) times daily.     polyethylene glycol (MIRALAX  / GLYCOLAX ) 17 g packet Take 17 g by mouth daily.     rosuvastatin  (CRESTOR ) 40 MG tablet TAKE 1 TABLET(40 MG) BY MOUTH DAILY 90 tablet 1   Tiotropium Bromide-Olodaterol (STIOLTO RESPIMAT ) 2.5-2.5 MCG/ACT AERS Inhale 2 puffs into the lungs daily. 1 each 11   zolpidem  (AMBIEN ) 5 MG tablet Take 1 tablet (5 mg total) by mouth at bedtime as needed for sleep. 30 tablet 5   Facility-Administered Medications Prior to Visit  Medication Dose Route Frequency Provider Last Rate Last Admin   [START ON 11/09/2023] denosumab  (PROLIA ) injection 60 mg  60 mg Subcutaneous Once Cox, Abigail, MD        Allergies  Allergen Reactions   Cortisone Other (See Comments)    Passed out according to patient can take prednisone    Glucosamine Other (See Comments)   Zyrtec [Cetirizine]     Weird dreams.    Ibuprofen Other (See Comments)    Pt states Dr doesn't wont her to take it; not allergic    Review of Systems  Constitutional:  Negative for chills, fatigue and fever.  HENT:  Negative for congestion, ear pain, rhinorrhea and sore throat.   Respiratory:  Negative for cough and shortness of breath.   Cardiovascular:  Negative for chest pain.  Gastrointestinal:  Negative for abdominal pain, constipation, diarrhea, nausea and vomiting.  Genitourinary:  Negative for dysuria and  urgency.  Musculoskeletal:  Positive for back pain. Negative for myalgias.  Neurological:  Negative for dizziness, weakness, light-headedness and headaches.  Psychiatric/Behavioral:  Negative for dysphoric mood. The patient is not nervous/anxious.        Objective:        09/19/2023    9:40 AM 08/22/2023   10:16 AM 08/06/2023    2:32 PM  Vitals with BMI  Height 5' 2 5' 2 5' 2  Weight 190 lbs 188 lbs 189 lbs  BMI 34.74 34.38 34.56  Systolic 124 138 891  Diastolic 70 88 58  Pulse 84 70 81    No data found.   Physical Exam Vitals reviewed.  Constitutional:      Appearance: Normal appearance.  Neck:     Vascular: No carotid bruit.  Cardiovascular:     Rate and Rhythm: Normal rate and regular rhythm.     Heart sounds: Normal heart sounds.  Pulmonary:     Effort: Pulmonary effort is normal.     Breath sounds: Normal breath sounds.  Abdominal:     General: Bowel sounds are normal.     Palpations: Abdomen is soft.     Tenderness: There is no abdominal tenderness.  Skin:    Findings: Rash present. Rash is scaling. Rash is not purpuric.      Neurological:     Mental Status: She is alert and oriented to person, place, and time.  Psychiatric:        Mood and Affect: Mood normal.        Behavior: Behavior normal.     Health Maintenance Due  Topic Date Due   Zoster Vaccines- Shingrix (2 of 2) 04/16/2021   Diabetic kidney evaluation - Urine ACR  02/02/2023   INFLUENZA VACCINE  08/17/2023  COVID-19 Vaccine (8 - 2024-25 season) 09/17/2023    There are no preventive care reminders to display for this patient.   Lab Results  Component Value Date   TSH 1.810 02/09/2021   Lab Results  Component Value Date   WBC 6.5 08/02/2023   HGB 13.8 08/02/2023   HCT 42.2 08/02/2023   MCV 92 08/02/2023   PLT 226 08/02/2023   Lab Results  Component Value Date   NA 138 08/02/2023   K 4.5 08/02/2023   CO2 21 08/02/2023   GLUCOSE 102 (H) 08/02/2023   BUN 16 08/02/2023    CREATININE 1.32 (H) 08/02/2023   BILITOT 0.5 08/02/2023   ALKPHOS 62 08/02/2023   AST 25 08/02/2023   ALT 18 08/02/2023   PROT 7.1 08/02/2023   ALBUMIN 4.5 08/02/2023   CALCIUM  9.3 08/02/2023   ANIONGAP 8 11/13/2021   EGFR 42 (L) 08/02/2023   GFR 55.96 (L) 11/25/2019   Lab Results  Component Value Date   CHOL 136 08/02/2023   Lab Results  Component Value Date   HDL 45 08/02/2023   Lab Results  Component Value Date   LDLCALC 67 08/02/2023   Lab Results  Component Value Date   TRIG 137 08/02/2023   Lab Results  Component Value Date   CHOLHDL 3.0 08/02/2023   Lab Results  Component Value Date   HGBA1C 6.0 (H) 08/02/2023       Assessment & Plan:  Tinea corporis Assessment & Plan: Tinea corporis and rash of uncertain etiology involving trunk and axilla Rash on trunk, axilla, and waist likely tinea corporis. Lack of pruritus or burning suggests low probability of eczema or psoriasis. No recent changes in soaps, detergents, or medications. Rash may worsen with sweating and moisture retention. - Prescribed Lotrimin  cream for affected areas. - Advised keeping skin dry, using a blow dryer on cool if necessary after showering. - Instructed to report if rash does not improve or worsens.  Orders: -     Clotrimazole ; Apply 1 Application topically 2 (two) times daily.  Dispense: 42 g; Refill: 0  Gastroesophageal reflux disease without esophagitis Assessment & Plan: GERD symptoms improved with current management, occasional globus sensation and eructation persist. She monitored dietary intake to manage symptoms. Continue taking Pepcid  40mg  and Protonix  40mg  as prescribed Has appointment with GI in October   Severe persistent asthma without complication Assessment & Plan: Congestion, possible respiratory infection or allergies Congestion possibly related to prevalent allergies. - Scheduled follow-up with pulmonologist for further assessment. - Has follow up with  pulmonology tomorrow       Meds ordered this encounter  Medications   clotrimazole  (LOTRIMIN ) 1 % cream    Sig: Apply 1 Application topically 2 (two) times daily.    Dispense:  42 g    Refill:  0    No orders of the defined types were placed in this encounter.    Follow-up: No follow-ups on file.  An After Visit Summary was printed and given to the patient.  Nola Angles, [place] [facility] [phone]

## 2023-09-20 ENCOUNTER — Ambulatory Visit: Admitting: Pulmonary Disease

## 2023-09-20 ENCOUNTER — Encounter: Payer: Self-pay | Admitting: Pulmonary Disease

## 2023-09-20 VITALS — BP 117/75 | HR 68 | Temp 97.8°F | Ht 62.0 in | Wt 189.6 lb

## 2023-09-20 DIAGNOSIS — R0989 Other specified symptoms and signs involving the circulatory and respiratory systems: Secondary | ICD-10-CM

## 2023-09-20 DIAGNOSIS — R0789 Other chest pain: Secondary | ICD-10-CM

## 2023-09-20 DIAGNOSIS — J181 Lobar pneumonia, unspecified organism: Secondary | ICD-10-CM

## 2023-09-20 DIAGNOSIS — J4550 Severe persistent asthma, uncomplicated: Secondary | ICD-10-CM

## 2023-09-20 DIAGNOSIS — J4531 Mild persistent asthma with (acute) exacerbation: Secondary | ICD-10-CM

## 2023-09-20 DIAGNOSIS — R0600 Dyspnea, unspecified: Secondary | ICD-10-CM | POA: Diagnosis not present

## 2023-09-20 DIAGNOSIS — K219 Gastro-esophageal reflux disease without esophagitis: Secondary | ICD-10-CM | POA: Diagnosis not present

## 2023-09-20 DIAGNOSIS — J4520 Mild intermittent asthma, uncomplicated: Secondary | ICD-10-CM

## 2023-09-20 DIAGNOSIS — J069 Acute upper respiratory infection, unspecified: Secondary | ICD-10-CM

## 2023-09-20 DIAGNOSIS — R062 Wheezing: Secondary | ICD-10-CM | POA: Diagnosis not present

## 2023-09-20 DIAGNOSIS — J4521 Mild intermittent asthma with (acute) exacerbation: Secondary | ICD-10-CM

## 2023-09-20 DIAGNOSIS — G4733 Obstructive sleep apnea (adult) (pediatric): Secondary | ICD-10-CM

## 2023-09-20 MED ORDER — AMOXICILLIN-POT CLAVULANATE 875-125 MG PO TABS: 1.0000 | ORAL_TABLET | Freq: Two times a day (BID) | ORAL | 0 refills | Status: AC

## 2023-09-20 MED ORDER — FLUCONAZOLE 100 MG PO TABS: ORAL_TABLET | ORAL | 0 refills | Status: AC

## 2023-09-20 NOTE — Progress Notes (Signed)
 @Patient  ID: Diana Hendrix, female    DOB: [birth year], 78 y.o.   MRN: [id number]  No chief complaint on file.   Referring provider: Sherre Clapper, MD  HPI:   78 y.o. woman whom we are seeing for evaluation of congestion cough wheeze dyspnea.  Multiple PCP notes reviewed.  Returns for follow-up.  Good adherence to [employer].  Dupixent  started 06/2023.  With improved symptoms.  Still with persistent cough and congestion but overall markedly improved.  Able to do more.  Do more on the house.  Again cough improved.  Still persistent congestion cough over the last 3 weeks or so.  Slightly worse than it had been with initiation of Dupixent .  CT scan interim reviewed which shows right-sided mild basilar bronchiectasis.  We discussed treatment of this.  She also has erythematous itchy rash underneath her breasts and under her arms as well as patchy scaly on her back.  Concerning for ringworm or tinea.  2 large area for topical.  Discussed fluconazole  treatment.  She agreed to take this.  HPI initial visit: Diagnosed with pneumonia 04/2022.  Based on chest x-ray.  I cannot review results.  Treated with antibiotics.  Since then has had worsening chest congestion, cough, chest tightness, dyspnea on exertion.  Waxes and wanes.  Comes and goes.  Severity is not always reliably reproducible.  Repeat chest x-ray 09/2022 reviewed, clear lungs.  CTA PE protocol 09/2022 for ongoing workup reviewed, no PE, diffuse mosaicism, no other infiltrate etc.  She had a lot of back pain and this did reveal back fractures.  This has been fixed, kyphoplasty in the interim.  Some residual pain but getting better.  Questionaires / Pulmonary Flowsheets:   ACT:  Asthma Control Test ACT Total Score  02/17/2022 11:01 AM 19    MMRC:     No data to display          Epworth:      No data to display          Tests:   FENO:  No results found for: NITRICOXIDE  PFT:    Latest Ref Rng & Units 01/29/2023    3:40 PM  04/25/2022   11:17 AM 08/07/2019    8:48 AM  PFT Results  FVC-Pre L 2.10  2.35  2.54   FVC-Predicted Pre % 82  91  93   FVC-Post L 2.32  2.24  2.60   FVC-Predicted Post % 91  87  96   Pre FEV1/FVC % % 85  82  84   Post FEV1/FCV % % 87  87  85   FEV1-Pre L 1.78  1.92  2.13   FEV1-Predicted Pre % 94  99  104   FEV1-Post L 2.02  1.95  2.20   DLCO uncorrected ml/min/mmHg 15.45  16.00  16.10   DLCO UNC% % 86  89  88   DLCO corrected ml/min/mmHg 15.04  16.00  16.10   DLCO COR %Predicted % 84  89  88   DLVA Predicted % 97  99  93   TLC L 4.04  4.40  4.34   TLC % Predicted % 84  92  89   RV % Predicted % 81  92  78   Personally reviewed and interpreted as 01/2023 significant bronchodilator response, otherwise normal spirometry, lung volumes DLCO within normal limits, largely unchanged over time with no findings of significant micro dilator response on review of prior PFTs  WALK:  No data to display          Imaging: Personally reviewed and as per EMR and discussion of this note No results found.   Lab Results: Personally reviewed CBC    Component Value Date/Time   WBC 6.5 08/02/2023 0816   WBC 11.0 (H) 11/11/2021 0101   RBC 4.60 08/02/2023 0816   RBC 3.92 11/11/2021 0101   HGB 13.8 08/02/2023 0816   HCT 42.2 08/02/2023 0816   PLT 226 08/02/2023 0816   MCV 92 08/02/2023 0816   MCH 30.0 08/02/2023 0816   MCH 30.1 11/11/2021 0101   MCHC 32.7 08/02/2023 0816   MCHC 34.1 11/11/2021 0101   RDW 13.1 08/02/2023 0816   LYMPHSABS 2.1 08/02/2023 0816   EOSABS 0.4 08/02/2023 0816   BASOSABS 0.1 08/02/2023 0816    BMET    Component Value Date/Time   NA 138 08/02/2023 0816   K 4.5 08/02/2023 0816   CL 101 08/02/2023 0816   CO2 21 08/02/2023 0816   GLUCOSE 102 (H) 08/02/2023 0816   GLUCOSE 121 (H) 11/13/2021 0043   BUN 16 08/02/2023 0816   CREATININE 1.32 (H) 08/02/2023 0816   CALCIUM  9.3 08/02/2023 0816   GFRNONAA 46 (L) 11/13/2021 0043   GFRAA 65 12/23/2019 1123     BNP    Component Value Date/Time   BNP 32.2 11/09/2021 1250    ProBNP    Component Value Date/Time   PROBNP 90 07/20/2020 1550   PROBNP 26.0 04/24/2019 0913    Specialty Problems       Pulmonary Problems   OSA (obstructive sleep apnea)          Allergic asthma   Mild intermittent asthma   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    Ibuprofen Other (See Comments)    Pt states Dr doesn't wont her to take it; not allergic    Immunization History  Administered Date(s) Administered   Fluad Quad(high Dose 65+) 10/16/2018, 11/10/2019, 09/27/2020, 10/24/2021   Fluad Trivalent(High Dose 65+) 12/04/2022   INFLUENZA, HIGH DOSE SEASONAL PF 10/09/2016, 10/04/2017   Influenza,inj,Quad PF,6+ Mos 10/25/2012, 09/17/2013, 09/21/2015   Influenza-Unspecified 12/12/2014   Moderna Covid-19 Vaccine Bivalent Booster 6yrs & up 01/04/2021   Moderna SARS-COV2 Booster Vaccination 06/21/2020   Moderna Sars-Covid-2 Vaccination 02/28/2019, 03/28/2019, 11/14/2019   Pfizer(Comirnaty)Fall Seasonal Vaccine 12 years and older 11/08/2021, 12/20/2022   Pneumococcal Conjugate-13 03/18/2015   Pneumococcal Polysaccharide-23 02/09/2011, 09/17/2013   Tdap 03/18/2015   Zoster Recombinant(Shingrix) 02/19/2021   Zoster, Live 09/17/2011    Past Medical History:  Diagnosis Date   Cancer (HCC)    Chronic right flank pain 12/04/2022   Diabetes mellitus without complication (HCC)    Diastolic heart failure (HCC)    Echo EF 60/65% Heart monitor was normal   GERD (gastroesophageal reflux disease)     History of colon polyps    Hyperlipidemia    Hypertension    Macular edema, cystoid    OSA (obstructive sleep apnea) 09/17/2012   Osteopenia    Plantar fasciitis 09/09/2019   Sleep  apnea    Syncope, vasovagal 04/16/2017    Tobacco History: Social History   Tobacco Use  Smoking Status Never   Passive exposure: Past  Smokeless Tobacco Never   Counseling given: Not Answered   Continue to not smoke  Outpatient Encounter Medications as of 09/20/2023  Medication Sig   albuterol  (VENTOLIN  HFA) 108 (90 Base) MCG/ACT inhaler Inhale 2 puffs into the lungs every 6 (six) hours as needed for wheezing or shortness of breath.   amoxicillin -clavulanate (AUGMENTIN ) 875-125 MG tablet Take 1 tablet by mouth 2 (two) times daily for 10 days.   Cholecalciferol (VITAMIN D3) 50 MCG (2000 UT) TABS Take 1 tablet by mouth daily.   citalopram  (CELEXA ) 10 MG tablet TAKE 1 TABLET(10 MG) BY MOUTH DAILY   clotrimazole  (LOTRIMIN ) 1 % cream Apply 1 Application topically 2 (two) times daily.   diltiazem  (CARDIZEM  CD) 180 MG 24 hr capsule Take 1 capsule (180 mg total) by mouth daily.   Dupilumab  (DUPIXENT ) 300 MG/2ML SOAJ Inject 300 mg into the skin every 14 (fourteen) days.   famotidine  (PEPCID ) 40 MG tablet Take 1 tablet (40 mg total) by mouth daily.   fluconazole  (DIFLUCAN ) 100 MG tablet Take 2 tablets (200 mg total) by mouth daily for 1 day, THEN 1 tablet (100 mg total) daily for 6 days.   fluticasone  (FLONASE ) 50 MCG/ACT nasal spray Place 2 sprays into both nostrils daily.   furosemide  (LASIX ) 20 MG tablet Take 1 tablet (20 mg total) by mouth daily. Patient needs to make an appointment for further refills. 1st attempt.   montelukast  (SINGULAIR ) 10 MG tablet Take 1 tablet (10 mg total) by mouth at bedtime.   NON FORMULARY CPAP at bedtime   olmesartan  (BENICAR ) 20 MG tablet Take 10 mg by mouth daily.   omega-3 acid ethyl esters (LOVAZA ) 1 g capsule TAKE 2 CAPSULES BY MOUTH TWICE DAILY   pantoprazole   (PROTONIX ) 40 MG tablet Take 1 tablet (40 mg total) by mouth daily.   Polyethyl Glycol-Propyl Glycol (SYSTANE OP) Place 1 drop into both eyes 4 (four) times daily.   polyethylene glycol (MIRALAX  / GLYCOLAX ) 17 g packet Take 17 g by mouth daily.   rosuvastatin  (CRESTOR ) 40 MG tablet TAKE 1 TABLET(40 MG) BY MOUTH DAILY   Tiotropium Bromide-Olodaterol (STIOLTO RESPIMAT ) 2.5-2.5 MCG/ACT AERS Inhale 2 puffs into the lungs daily.   zolpidem  (AMBIEN ) 5 MG tablet Take 1 tablet (5 mg total) by mouth at bedtime as needed for sleep.   Facility-Administered Encounter Medications as of 09/20/2023  Medication   [START ON 11/09/2023] denosumab  (PROLIA ) injection 60 mg     Review of Systems  Review of Systems  N/a  Physical Exam  BP 117/75   Pulse 68   Temp 97.8 F (36.6 C) (Oral)   Ht 5' 2 (1.575 m)   Wt 189 lb 9.6 oz (86 kg)   SpO2 95%   BMI 34.68 kg/m   Wt Readings from Last 5 Encounters:  09/20/23 189 lb 9.6 oz (86 kg)  09/19/23 190 lb (86.2 kg)  08/22/23 188 lb (85.3 kg)  08/06/23 189 lb (85.7 kg)  06/06/23 190 lb 12.8 oz (86.5 kg)    BMI Readings from Last 5 Encounters:  09/20/23 34.68 kg/m  09/19/23 34.75 kg/m  08/22/23 34.39 kg/m  08/06/23 34.57 kg/m  06/06/23 34.90 kg/m     Physical Exam General: Sitting in chair, in no acute distress eyes: EOMI, no icterus Neck: Supple, no JVD Pulmonary: Clear, normal work of breathing Cardiovascular: Warm,  no edema Abdomen: Nondistended, MSK: No synovitis no major effusion Neuro: Normal gait, no weakness Psych: Normal mood, full affect Skin: Patchy scaly erythematous lesions on the back without widespread confluence   Assessment & Plan:   Chest congestion, wheeze, intermittent dyspnea, chest tightness likely related to asthma: 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  Likely asthma.  Possibly triggered or worsened after pneumonia diagnosis 04/2022.  CT scan in the interim with mosaicism otherwise  clear lungs.  Chest x-ray also clear.  Hoarseness of voice with Trelegy, Wixela, Symbicort .  No improvement in symptoms with Spiriva  monotherapy.  No improvement with Stiolto.  Discussed limitations in treatment with inability to tolerate ICS.  Try to avoid ICS in the future.  Eosinophil count 400/2025.  Start Dupixent  06/2023 with improvement.  Continue Dupixent  and Stiolto for now.  GERD: Likely contributing somewhat to cough and edema in the throat.  Continue GI evaluation.  Presumed tinea corporis infection: Erythematous and itchy under the breasts underarms as well as patchy less confluent areas on the back.  Concerning for tinea.  Fluconazole  treatment for 1 week prescribed, if no better contact PCP or dermatologist.  Continue topical agents but given area of involvement I think topical agents will be difficult    Return in about 3 months (around 12/20/2023) for f/u Dr. Annella.   Donnice JONELLE Annella, MD 09/20/2023   This appointment required 41 minutes of patient care (this includes precharting, chart review, review of results, face-to-face care, etc.).

## 2023-09-20 NOTE — Patient Instructions (Signed)
 Nice to see you again  Continue inhalers and Dupixent   For the rash that looks like ringworm, take fluconazole  as prescribed -if not improving contact your primary care doctor or dermatologist for further evaluation  For the cough, this could be a buildup of mucus that we see sometimes with reflux and asthma, take Augmentin  1 tablet twice a day for 10 days  Return to clinic in 3 months or sooner as needed with Dr. Katrinka

## 2023-10-13 ENCOUNTER — Other Ambulatory Visit: Payer: Self-pay | Admitting: Family Medicine

## 2023-10-13 DIAGNOSIS — F330 Major depressive disorder, recurrent, mild: Secondary | ICD-10-CM

## 2023-10-18 ENCOUNTER — Other Ambulatory Visit: Payer: Self-pay | Admitting: Family Medicine

## 2023-10-18 DIAGNOSIS — G4709 Other insomnia: Secondary | ICD-10-CM

## 2023-10-19 ENCOUNTER — Ambulatory Visit: Admitting: Gastroenterology

## 2023-10-19 ENCOUNTER — Encounter: Payer: Self-pay | Admitting: Gastroenterology

## 2023-10-19 VITALS — BP 112/60 | HR 72 | Ht 61.0 in | Wt 191.2 lb

## 2023-10-19 DIAGNOSIS — Z860101 Personal history of adenomatous and serrated colon polyps: Secondary | ICD-10-CM | POA: Diagnosis not present

## 2023-10-19 DIAGNOSIS — R09A2 Foreign body sensation, throat: Secondary | ICD-10-CM | POA: Diagnosis not present

## 2023-10-19 DIAGNOSIS — R058 Other specified cough: Secondary | ICD-10-CM

## 2023-10-19 DIAGNOSIS — K219 Gastro-esophageal reflux disease without esophagitis: Secondary | ICD-10-CM

## 2023-10-19 DIAGNOSIS — K5909 Other constipation: Secondary | ICD-10-CM

## 2023-10-19 DIAGNOSIS — R490 Dysphonia: Secondary | ICD-10-CM | POA: Diagnosis not present

## 2023-10-19 DIAGNOSIS — K5904 Chronic idiopathic constipation: Secondary | ICD-10-CM

## 2023-10-19 MED ORDER — PANTOPRAZOLE SODIUM 40 MG PO TBEC: 40.0000 mg | DELAYED_RELEASE_TABLET | Freq: Two times a day (BID) | ORAL | 3 refills | Status: DC

## 2023-10-19 NOTE — Progress Notes (Signed)
 Chief Complaint:GERD Primary GI Doctor:Dr. Charlanne  HPI:  Patient is a  78  year old female patient with past medical history of DM, GERD, hypertension, who was referred to me by Sherre Clapper, MD on 08/22/23 for a evaluation of GERD.   Patient last seen in GI office by Dr. Charlanne on 05/13/19 for chronic constipation.  09/20/23 seen by pulmonary for follow-up. CT scan interim reviewed which shows right-sided mild basilar bronchiectasis. Started on dupixent . Per note: GERD likely contributing somewhat to cough and edema in the throat. Continue GI evaluation.   Interval History    Patient presents for evaluation of GERD and currently taking Pantoprazole  40mg  po daily and Pepcid  40mg  in the evening.    She reports her current issues all started  two years ago she had vaso vagal episode and passed out and fractured her T7-T8.  Patient reports she had two separate falls due to vaso vagal episodes. She has had issues with recurrent pneumonia since the event.  Patient reports she initially developed a dry cough and felt a large lump in the back of her throat.  Patient was evaluated by pulmonology as well as started on omeprazole .  Patient reports the symptoms did not get better therefore she was evaluated in urgent care where they switched her from omeprazole  to pantoprazole .  Patient was then evaluated by her PCP and having issues with dry cough and hoarseness.  Patient was started on Pepcid  in the evening.  Patient states she was also started on Dupixent  by her pulmonologist for her asthma.  Patient reports with all the recent changes she has felt somewhat better but continues with current issues.  When she last saw her pulmonologist about a month ago it was mentioned that she should come see Dr. Charlanne.   Patient also has history of chronic constipation and currently taking OTC miralax  po daily titrate to effect and stool softeners as needed.  She has tried Linzess  along with other prescription medications for  constipation in past, but states it caused severe nausea leading to vaso vagal episodes and falls. She enquires today if ok for her to take them daily. Denies abdominal pain or blood in stool.   Past GI procedures: Colonoscopy 02/2019: Mild diverticulosis.  Otherwise normal.  10/2013: ([facility]) Fair prep, 6 mm polyp SP polypectomy, mild sigmoid diverticulosis.  Biopsies tubular adenoma. No need to repeat unless new problems.   Wt Readings from Last 3 Encounters:  10/19/23 191 lb 4 oz (86.8 kg)  09/20/23 189 lb 9.6 oz (86 kg)  09/19/23 190 lb (86.2 kg)    Past Medical History:  Diagnosis Date   Asthma    Bronchiectasis (HCC)    Chronic right flank pain 12/04/2022   Diabetes mellitus without complication (HCC)    Diastolic heart failure (HCC)    Echo EF 60/65% Heart monitor was normal   GERD (gastroesophageal reflux disease)    History of colon polyps    Hyperlipidemia    Hypertension    Macular edema, cystoid    OSA (obstructive sleep apnea) 09/17/2012   CPAP   Osteopenia    Osteoporosis    Plantar fasciitis 09/09/2019   Pneumonia    Sleep apnea    Syncope, vasovagal 04/16/2017    Past Surgical History:  Procedure Laterality Date   CESAREAN SECTION     COLONOSCOPY  10/29/2013   Colonic polyp status post polypectomy. Mild sigmoid diverticulosis. Small internal hemorrhoids   FOOT NEUROMA SURGERY Bilateral    s/p  surgery in both feet   MENISCUS REPAIR Left 09/13/2016   MENISCUS REPAIR Right 2019   SHOULDER ARTHROSCOPY Left    arthroscopy.    TUBAL LIGATION      Current Outpatient Medications  Medication Sig Dispense Refill   albuterol  (VENTOLIN  HFA) 108 (90 Base) MCG/ACT inhaler Inhale 2 puffs into the lungs every 6 (six) hours as needed for wheezing or shortness of breath. 16 g 3   Cholecalciferol (VITAMIN D3) 50 MCG (2000 UT) TABS Take 1 tablet by mouth daily.     citalopram  (CELEXA ) 10 MG tablet TAKE 1 TABLET(10 MG) BY MOUTH DAILY 90 tablet 1   diltiazem  (CARDIZEM  CD)  180 MG 24 hr capsule Take 1 capsule (180 mg total) by mouth daily. 90 capsule 0   Dupilumab  (DUPIXENT ) 300 MG/2ML SOAJ Inject 300 mg into the skin every 14 (fourteen) days. 12 mL 1   famotidine  (PEPCID ) 40 MG tablet Take 1 tablet (40 mg total) by mouth daily. 90 tablet 1   fluticasone  (FLONASE ) 50 MCG/ACT nasal spray Place 2 sprays into both nostrils daily. 16 g 6   furosemide  (LASIX ) 20 MG tablet Take 1 tablet (20 mg total) by mouth daily. Patient needs to make an appointment for further refills. 1st attempt. 90 tablet 3   montelukast  (SINGULAIR ) 10 MG tablet Take 1 tablet (10 mg total) by mouth at bedtime. 90 tablet 3   NON FORMULARY CPAP at bedtime     olmesartan  (BENICAR ) 20 MG tablet Take 10 mg by mouth daily.     omega-3 acid ethyl esters (LOVAZA ) 1 g capsule TAKE 2 CAPSULES BY MOUTH TWICE DAILY 360 capsule 2   Polyethyl Glycol-Propyl Glycol (SYSTANE OP) Place 1 drop into both eyes 4 (four) times daily.     polyethylene glycol (MIRALAX  / GLYCOLAX ) 17 g packet Take 17 g by mouth daily.     rosuvastatin  (CRESTOR ) 40 MG tablet TAKE 1 TABLET(40 MG) BY MOUTH DAILY 90 tablet 1   Tiotropium Bromide-Olodaterol (STIOLTO RESPIMAT ) 2.5-2.5 MCG/ACT AERS Inhale 2 puffs into the lungs daily. 1 each 11   zolpidem  (AMBIEN ) 5 MG tablet TAKE 1 TABLET(5 MG) BY MOUTH AT BEDTIME AS NEEDED FOR SLEEP 30 tablet 0   pantoprazole  (PROTONIX ) 40 MG tablet Take 1 tablet (40 mg total) by mouth 2 (two) times daily. 90 tablet 3   Current Facility-Administered Medications  Medication Dose Route Frequency Provider Last Rate Last Admin   [START ON 11/09/2023] denosumab  (PROLIA ) injection 60 mg  60 mg Subcutaneous Once Sherre Clapper, MD        Allergies as of 10/19/2023 - Review Complete 10/19/2023  Allergen Reaction Noted   Cortisone Other (See Comments) 04/26/2017   Glucosamine Other (See Comments) 04/16/2017   Zyrtec [cetirizine]  01/04/2021   Ibuprofen Other (See Comments) 08/14/2019    Family History  Problem  Relation Age of Onset   Hyperlipidemia Mother    Heart disease Mother    Hypertension Mother    Atrial fibrillation Mother    CAD Mother    Congestive Heart Failure Mother    CVA Father    Hypertension Brother    Heart disease Maternal Grandmother    Heart disease Maternal Grandfather    Hypertension Son    Thyroid  disease Daughter    Colon cancer Neg Hx    Esophageal cancer Neg Hx     Review of Systems:    Constitutional: No weight loss, fever, chills, weakness or fatigue HEENT: Eyes: No change in vision  Ears, Nose, Throat:  No change in hearing or congestion Skin: No rash or itching Cardiovascular: No chest pain, chest pressure or palpitations   Respiratory: No SOB or cough Gastrointestinal: See HPI and otherwise negative Genitourinary: No dysuria or change in urinary frequency Neurological: No headache, dizziness or syncope Musculoskeletal: No new muscle or joint pain Hematologic: No bleeding or bruising Psychiatric: No history of depression or anxiety    Physical Exam:  Vital signs: BP 112/60 (BP Location: Left Arm, Patient Position: Sitting, Cuff Size: Normal)   Pulse 72   Ht 5' 1 (1.549 m) Comment: height measured without shoes  Wt 191 lb 4 oz (86.8 kg)   BMI 36.14 kg/m   Constitutional:   Pleasant female appears to be in NAD, Well developed, Well nourished, alert and cooperative Throat: Oral cavity and pharynx without inflammation, swelling or lesion.  Respiratory: Respirations even and unlabored. Lungs clear to auscultation bilaterally.   No wheezes, crackles, or rhonchi.  Cardiovascular: Normal S1, S2. Regular rate and rhythm. No peripheral edema, cyanosis or pallor.  Gastrointestinal:  Soft, nondistended, nontender. No rebound or guarding. Normal bowel sounds. No appreciable masses or hepatomegaly. Rectal:  Not performed.  Msk:  Symmetrical without gross deformities. Without edema, no deformity or joint abnormality.  Neurologic:  Alert and   oriented x4;  grossly normal neurologically.  Skin:   Dry and intact without significant lesions or rashes.  RELEVANT LABS AND IMAGING: CBC    Latest Ref Rng & Units 08/02/2023    8:16 AM 04/05/2023   10:31 AM 12/20/2022   10:21 AM  CBC  WBC 3.4 - 10.8 x10E3/uL 6.5  6.8  7.8   Hemoglobin 11.1 - 15.9 g/dL 86.1  86.1  85.6   Hematocrit 34.0 - 46.6 % 42.2  42.7  43.5   Platelets 150 - 450 x10E3/uL 226  211  243      CMP     Latest Ref Rng & Units 08/02/2023    8:16 AM 04/05/2023   10:31 AM 12/20/2022   10:21 AM  CMP  Glucose 70 - 99 mg/dL 897  97  894   BUN 8 - 27 mg/dL 16  15  13    Creatinine 0.57 - 1.00 mg/dL 8.67  8.85  8.96   Sodium 134 - 144 mmol/L 138  137  141   Potassium 3.5 - 5.2 mmol/L 4.5  4.4  4.6   Chloride 96 - 106 mmol/L 101  101  102   CO2 20 - 29 mmol/L 21  22  22    Calcium  8.7 - 10.3 mg/dL 9.3  9.3  9.3   Total Protein 6.0 - 8.5 g/dL 7.1  7.0  7.1   Total Bilirubin 0.0 - 1.2 mg/dL 0.5  0.6  0.5   Alkaline Phos 44 - 121 IU/L 62  63  92   AST 0 - 40 IU/L 25  27  30    ALT 0 - 32 IU/L 18  20  18       Lab Results  Component Value Date   TSH 1.810 02/09/2021   10/23 echo- Left ventricular ejection fraction, by estimation, is 60 to 65%.   Assessment: Encounter Diagnoses  Name Primary?   Globus sensation Yes   Gastroesophageal reflux disease without esophagitis    Dry cough    Hoarseness    Chronic idiopathic constipation      78 year old female patient that presents with persistent dry cough, hoarseness and globus sensation thought to be related to possible  GERD.  He has been evaluated and treated by a pulmonologist for asthma. They recommended she come see us  for evaluation of possible GERD.  Patient currently on pantoprazole  40 mg in the morning and Pepcid  40 mg in the evening with some improvement.  We discussed increasing her current medication regimen versus endoscopic procedures she would like to proceed with both.  Will go ahead and increase the  pantoprazole  to twice daily with Pepcid  at bedtime.  If the trial of medication does not help and the endoscopy is negative we will consider impedance study with esophageal manometry to evaluate level of acidification.     Patient also has history of chronic constipation that is well-managed with over-the-counter MiraLAX  and stool softeners.  Patient will titrate as needed.  Patient does not tolerate pro secretory agents.  Plan: - Continue OTC miralax  po daily, titrate as needed -Continue stool softeners prn -Increase pantoprazole  40 mg to twice daily -Continue Pepcid  40 mg, take at bedtime -Schedule EGD with possible dilatation in [facility] with Dr. Charlanne. The risks and benefits of EGD with possible biopsies and esophageal dilation were discussed with the patient who agrees to proceed. -if negative exam Obelia Bonello consider impedence study/ eso manometry  Thank you for the courtesy of this consult. Please call me with any questions or concerns.   Kerisha Goughnour, FNP-C [facility] 10/19/2023, 11:33 AM  Cc: Sherre Clapper, MD

## 2023-10-19 NOTE — Patient Instructions (Addendum)
 Constipation Continue OTC miralax  po daily Continue stool softeners    GERD Recommend GERD diet Increase pantoprazole  40 mg to twice daily Continue Pepcid  40 mg take at bedtime   You are a chronic throat clearer! You are not alone! The causes of chronic throat clearing include acid reflux (laryngopharyngeal reflux), allergies, environmental irritants such as tobacco smoke and air pollution, and asthma. If present for a long time throat clearing can become habit forming. When you clear your throat, you are transferring mucus from your throat up into your mouth and nose. We all secrete up to 2 liters (imagine a big Coke bottle) of mucus a day. This saliva is usually swallowed and ends up in the toilet eventually. By clearing the mucus back into your mouth and nose you are sending the saliva in the wrong direction. This is counterproductive. Unless you are walking around spitting all day (which most throat clearers do not do), the mucus will work its way back down to the throat and eventually be swallowed. Get the mucus going in the right direction. Swallow! Swallow! Swallow! No throat clearing.  Chronic throat clearing is damaging. The trauma from the throat clearing can cause redness and swelling of your vocal cords. If the clearing is very excessive small growths (granulomas) can form. These granulomas can get so large that they can eventually affect your breathing. Surgical removal may be necessary. The irritation and swelling produced by the clearing can cause saliva to sit in your throat. This causes more throat clearing. More throat clearing causes more stagnant mucus which causes more throat clearing, which causes more mucus, etc... A vicious cycle will ensue and the habit can be very difficult to break. Without your help and a conscious effort on your part to break the cycle, the throat clearing will never stop.  Your doctor may prescribe medication and behavioral modifications to treat acid  reflux disease. Nose and throat sprays may be prescribed to treat underlying allergies or asthma. Avoiding possible irritants will be recommended. Without changes to your behavior these treatments will not be successful. The following alterations are recommended:  Do not clear your throat. Swallow instead. This gets the mucus going in the right direction towards the toilet. Carry around some water to assist with swallowing and mucus clearance. When you feel the urge to clear your throat take a sip of the water. If you absolutely need to clear your throat perform a non-traumatic throat clear. To do this pant with your mouth open and say [place], [place], [place] with a powerful but very breathy voice. This will clear the secretions without causing damage. Increase your water intake. This will thin secretions and make it easier to swallow. Comply with the behavior recommendations for reflux disease. Chew baking soda ([employer]) gum. This can be found on the internet or in the tooth paste isle of your pharmacy. Gum chewing can help with swallowing, reflux, and throat clearing. Chew three pieces a day. If you develop jaw discomfort or headaches decrease the amount of gum chewing. Tell your friends and family to tell you to swallow when you clear your throat. Some people have been clearing so long that they don't even know when they are doing it. Be patient. The urge to clear your throat will not go away overnight. It may take 8 or 12 weeks for the medication and behavior modifications to work.  Metta Sours!   You have been scheduled for an endoscopy. Please follow written instructions given to you  at your visit today.  If you use inhalers (even only as needed), please bring them with you on the day of your procedure.  If you take any of the following medications, they will need to be adjusted prior to your procedure:   DO NOT TAKE 7 DAYS PRIOR TO TEST- Trulicity (dulaglutide) Ozempic, Wegovy  (semaglutide) Mounjaro (tirzepatide) Bydureon Bcise (exanatide extended release)  DO NOT TAKE 1 DAY PRIOR TO YOUR TEST Rybelsus (semaglutide) Adlyxin (lixisenatide) Victoza (liraglutide) Byetta (exanatide) ___________________________________________________________________________  Due to recent changes in healthcare laws, you may see the results of your imaging and laboratory studies on MyChart before your provider has had a chance to review them.  We understand that in some cases there may be results that are confusing or concerning to you. Not all laboratory results come back in the same time frame and the provider may be waiting for multiple results in order to interpret others.  Please give us  48 hours in order for your provider to thoroughly review all the results before contacting the office for clarification of your results.   _______________________________________________________  If your blood pressure at your visit was 140/90 or greater, please contact your primary care physician to follow up on this.  _______________________________________________________  If you are age 77 or older, your body mass index should be between 23-30. Your Body mass index is 36.14 kg/m. If this is out of the aforementioned range listed, please consider follow up with your Primary Care Provider.  If you are age 77 or younger, your body mass index should be between 19-25. Your Body mass index is 36.14 kg/m. If this is out of the aformentioned range listed, please consider follow up with your Primary Care Provider.   ________________________________________________________  The [facility] providers would like to encourage you to use MYCHART to communicate with providers for non-urgent requests or questions.  Due to long hold times on the telephone, sending your provider a message by [facility] may be a faster and more efficient way to get a response.  Please allow 48 business hours for a response.   Please remember that this is for non-urgent requests.  _______________________________________________________  [facility] is using a team-based approach to care.  Your team is made up of your doctor and two to three APPS. Our APPS (Nurse Practitioners and Physician Assistants) work with your physician to ensure care continuity for you. They are fully qualified to address your health concerns and develop a treatment plan. They communicate directly with your gastroenterologist to care for you. Seeing the Advanced Practice Practitioners on your physician's team can help you by facilitating care more promptly, often allowing for earlier appointments, access to diagnostic testing, procedures, and other specialty referrals.  Thank you for trusting me with your gastrointestinal care. Deanna May, FNP-C

## 2023-10-26 ENCOUNTER — Encounter: Payer: Self-pay | Admitting: Physician Assistant

## 2023-10-26 ENCOUNTER — Ambulatory Visit (INDEPENDENT_AMBULATORY_CARE_PROVIDER_SITE_OTHER): Admitting: Physician Assistant

## 2023-10-26 VITALS — BP 152/72 | HR 77 | Temp 97.0°F | Ht 61.0 in | Wt 189.0 lb

## 2023-10-26 DIAGNOSIS — J0100 Acute maxillary sinusitis, unspecified: Secondary | ICD-10-CM

## 2023-10-26 DIAGNOSIS — J4520 Mild intermittent asthma, uncomplicated: Secondary | ICD-10-CM

## 2023-10-26 MED ORDER — BENZONATATE 200 MG PO CAPS: 200.0000 mg | ORAL_CAPSULE | Freq: Three times a day (TID) | ORAL | 3 refills | Status: DC | PRN

## 2023-10-26 MED ORDER — AMOXICILLIN-POT CLAVULANATE 875-125 MG PO TABS: 1.0000 | ORAL_TABLET | Freq: Two times a day (BID) | ORAL | 0 refills | Status: DC

## 2023-10-26 NOTE — Assessment & Plan Note (Signed)
 Asthma Asthma exacerbation with wheezing and increased inhaler use. Symptoms improved with inhalers and Dupixent  injections. - Continue current asthma management with inhalers. - Continue Dupixent  injections. - Monitor symptoms and report worsening.

## 2023-10-26 NOTE — Progress Notes (Signed)
 Acute Office Visit  Subjective:    Patient ID: Diana Hendrix, female    DOB: [birth year], 78 y.o.   MRN: [id number]  Chief Complaint  Patient presents with   Sinusitis    HPI: Patient is in today for sinusitis  Discussed the use of AI scribe software for clinical note transcription with the patient, who gave verbal consent to proceed.  History of Present Illness Diana Hendrix is a 78 year old female who presents with a worsening cough and sinus congestion.  She has been experiencing a worsening cough over the past two weeks, describing it as a 'deep barking cough' that started yesterday. The cough is persistent, but there is no vomiting or fever associated with it. It is accompanied by wheezing, for which she used her inhaler yesterday and this morning, finding some relief.  The sinus congestion began two weeks ago, starting with nasal obstruction on one side and spreading across her sinuses. She reports a headache yesterday, located across her forehead, but no ear pain. She experienced a sore throat at the onset, which she attributes to drainage, as it improved throughout the day with eating and drinking.  She has a history of coughing and congestion in April, for which she was treated with azithromycin  initially, followed by Augmentin , which provided better relief. She is currently using inhalers to manage her symptoms and has previously found Dupixent  shots helpful in improving her condition.  She also reports ongoing issues with acid reflux and has an endoscopy scheduled for November 20th. She postponed this procedure.   Past Medical History:  Diagnosis Date   Asthma    Bronchiectasis (HCC)    Chronic right flank pain 12/04/2022   Diabetes mellitus without complication (HCC)    Diastolic heart failure (HCC)    Echo EF 60/65% Heart monitor was normal   GERD (gastroesophageal reflux disease)    History of colon polyps    Hyperlipidemia    Hypertension     Macular edema, cystoid    OSA (obstructive sleep apnea) 09/17/2012   CPAP   Osteopenia    Osteoporosis    Plantar fasciitis 09/09/2019   Pneumonia    Sleep apnea    Syncope, vasovagal 04/16/2017    Past Surgical History:  Procedure Laterality Date   CESAREAN SECTION     COLONOSCOPY  10/29/2013   Colonic polyp status post polypectomy. Mild sigmoid diverticulosis. Small internal hemorrhoids   FOOT NEUROMA SURGERY Bilateral    s/p surgery in both feet   MENISCUS REPAIR Left 09/13/2016   MENISCUS REPAIR Right 2019   SHOULDER ARTHROSCOPY Left    arthroscopy.    TUBAL LIGATION      Family History  Problem Relation Age of Onset   Hyperlipidemia Mother    Heart disease Mother    Hypertension Mother    Atrial fibrillation Mother    CAD Mother    Congestive Heart Failure Mother    CVA Father    Hypertension Brother    Heart disease Maternal Grandmother    Heart disease Maternal Grandfather    Hypertension Son    Thyroid  disease Daughter    Colon cancer Neg Hx    Esophageal cancer Neg Hx     Social History   Socioeconomic History   Marital status: Married    Spouse name: Zeola Brys   Number of children: 2   Years of education: Not on file   Highest education level: Associate degree: occupational, Scientist, product/process development, or vocational program  Occupational History   Occupation: retired  Tobacco Use   Smoking status: Never    Passive exposure: Past   Smokeless tobacco: Never  Vaping Use   Vaping status: Never Used  Substance and Sexual Activity   Alcohol use: No   Drug use: No   Sexual activity: Not Currently  Other Topics Concern   Not on file  Social History Narrative   Lives with husband   Right handed   Caffeine: 1 cup of tea and 1 pepsi in the AM   Social Drivers of Health   Financial Resource Strain: Low Risk  (09/18/2023)   Overall Financial Resource Strain (CARDIA)    Difficulty of Paying Living Expenses: Not hard at all  Food Insecurity: No Food Insecurity  (09/18/2023)   Hunger Vital Sign    Worried About Running Out of Food in the Last Year: Never true    Ran Out of Food in the Last Year: Never true  Transportation Needs: No Transportation Needs (09/18/2023)   PRAPARE - Administrator, Civil Service (Medical): No    Lack of Transportation (Non-Medical): No  Physical Activity: Sufficiently Active (09/18/2023)   Exercise Vital Sign    Days of Exercise per Week: 3 days    Minutes of Exercise per Session: 60 min  Stress: No Stress Concern Present (09/18/2023)   [employer] of Occupational Health - Occupational Stress Questionnaire    Feeling of Stress: Not at all  Social Connections: Moderately Integrated (09/18/2023)   Social Connection and Isolation Panel    Frequency of Communication with Friends and Family: More than three times a week    Frequency of Social Gatherings with Friends and Family: More than three times a week    Attends Religious Services: More than 4 times per year    Active Member of [employer] or Organizations: No    Attends Banker Meetings: Not on file    Marital Status: Married  Catering manager Violence: Not At Risk (05/23/2023)   Humiliation, Afraid, Rape, and Kick questionnaire    Fear of Current or Ex-Partner: No    Emotionally Abused: No    Physically Abused: No    Sexually Abused: No    Outpatient Medications Prior to Visit  Medication Sig Dispense Refill   albuterol  (VENTOLIN  HFA) 108 (90 Base) MCG/ACT inhaler Inhale 2 puffs into the lungs every 6 (six) hours as needed for wheezing or shortness of breath. 16 g 3   Cholecalciferol (VITAMIN D3) 50 MCG (2000 UT) TABS Take 1 tablet by mouth daily.     citalopram  (CELEXA ) 10 MG tablet TAKE 1 TABLET(10 MG) BY MOUTH DAILY 90 tablet 1   diltiazem  (CARDIZEM  CD) 180 MG 24 hr capsule Take 1 capsule (180 mg total) by mouth daily. 90 capsule 0   Dupilumab  (DUPIXENT ) 300 MG/2ML SOAJ Inject 300 mg into the skin every 14 (fourteen) days. 12 mL 1    famotidine  (PEPCID ) 40 MG tablet Take 1 tablet (40 mg total) by mouth daily. 90 tablet 1   fluticasone  (FLONASE ) 50 MCG/ACT nasal spray Place 2 sprays into both nostrils daily. 16 g 6   furosemide  (LASIX ) 20 MG tablet Take 1 tablet (20 mg total) by mouth daily. Patient needs to make an appointment for further refills. 1st attempt. 90 tablet 3   montelukast  (SINGULAIR ) 10 MG tablet Take 1 tablet (10 mg total) by mouth at bedtime. 90 tablet 3   NON FORMULARY CPAP at bedtime     olmesartan  (  BENICAR ) 20 MG tablet Take 10 mg by mouth daily.     omega-3 acid ethyl esters (LOVAZA ) 1 g capsule TAKE 2 CAPSULES BY MOUTH TWICE DAILY 360 capsule 2   pantoprazole  (PROTONIX ) 40 MG tablet Take 1 tablet (40 mg total) by mouth 2 (two) times daily. 90 tablet 3   Polyethyl Glycol-Propyl Glycol (SYSTANE OP) Place 1 drop into both eyes 4 (four) times daily.     polyethylene glycol (MIRALAX  / GLYCOLAX ) 17 g packet Take 17 g by mouth daily.     rosuvastatin  (CRESTOR ) 40 MG tablet TAKE 1 TABLET(40 MG) BY MOUTH DAILY 90 tablet 1   senna (SENOKOT) 8.6 MG tablet Take 1 tablet by mouth daily.     Tiotropium Bromide-Olodaterol (STIOLTO RESPIMAT ) 2.5-2.5 MCG/ACT AERS Inhale 2 puffs into the lungs daily. 1 each 11   zolpidem  (AMBIEN ) 5 MG tablet TAKE 1 TABLET(5 MG) BY MOUTH AT BEDTIME AS NEEDED FOR SLEEP 30 tablet 0   Facility-Administered Medications Prior to Visit  Medication Dose Route Frequency Provider Last Rate Last Admin   [START ON 11/09/2023] denosumab  (PROLIA ) injection 60 mg  60 mg Subcutaneous Once Cox, Abigail, MD        Allergies  Allergen Reactions   Cortisone Other (See Comments)    Passed out according to patient can take prednisone    Glucosamine Other (See Comments)   Zyrtec [Cetirizine]     Weird dreams.    Ibuprofen Other (See Comments)    Pt states Dr doesn't wont her to take it; not allergic, no NSAIDS    Review of Systems  Constitutional:  Negative for appetite change, fatigue and fever.   HENT:  Positive for congestion and sinus pressure. Negative for ear pain and sore throat.   Respiratory:  Positive for cough and wheezing. Negative for chest tightness and shortness of breath.   Cardiovascular:  Negative for chest pain and palpitations.  Gastrointestinal:  Negative for abdominal pain, constipation, diarrhea, nausea and vomiting.  Genitourinary:  Negative for dysuria and hematuria.  Musculoskeletal:  Negative for arthralgias, back pain, joint swelling and myalgias.  Skin:  Negative for rash.  Neurological:  Negative for dizziness, weakness and headaches.  Psychiatric/Behavioral:  Negative for dysphoric mood. The patient is not nervous/anxious.        Objective:        10/26/2023   10:12 AM 10/19/2023   10:18 AM 09/20/2023   11:21 AM  Vitals with BMI  Height 5' 1 5' 1 5' 2  Weight 189 lbs 191 lbs 4 oz 189 lbs 10 oz  BMI 35.73 36.15 34.67  Systolic 152 112 882  Diastolic 72 60 75  Pulse 77 72 68    Orthostatic VS for the past 72 hrs (Last 3 readings):  Patient Position BP Location  10/26/23 1012 Sitting Left Arm     Physical Exam Vitals reviewed.  Constitutional:      Appearance: Normal appearance.  HENT:     Right Ear: Hearing normal. Swelling present. No middle ear effusion. Tympanic membrane is bulging. Tympanic membrane is not erythematous.     Left Ear: Hearing normal. Swelling present. A middle ear effusion is present. Tympanic membrane is bulging. Tympanic membrane is not erythematous.     Nose:     Right Sinus: Maxillary sinus tenderness and frontal sinus tenderness present.     Left Sinus: Maxillary sinus tenderness and frontal sinus tenderness present.  Neck:     Vascular: No carotid bruit.  Cardiovascular:  Rate and Rhythm: Normal rate and regular rhythm.     Heart sounds: Normal heart sounds.  Pulmonary:     Effort: Pulmonary effort is normal.     Breath sounds: Wheezing present.  Abdominal:     General: Bowel sounds are normal.      Palpations: Abdomen is soft.     Tenderness: There is no abdominal tenderness.  Neurological:     Mental Status: She is alert and oriented to person, place, and time.  Psychiatric:        Mood and Affect: Mood normal.        Behavior: Behavior normal.     Health Maintenance Due  Topic Date Due   Zoster Vaccines- Shingrix (2 of 2) 04/16/2021   Diabetic kidney evaluation - Urine ACR  02/02/2023   Influenza Vaccine  08/17/2023   COVID-19 Vaccine (8 - 2025-26 season) 09/17/2023    There are no preventive care reminders to display for this patient.   Lab Results  Component Value Date   TSH 1.810 02/09/2021   Lab Results  Component Value Date   WBC 6.5 08/02/2023   HGB 13.8 08/02/2023   HCT 42.2 08/02/2023   MCV 92 08/02/2023   PLT 226 08/02/2023   Lab Results  Component Value Date   NA 138 08/02/2023   K 4.5 08/02/2023   CO2 21 08/02/2023   GLUCOSE 102 (H) 08/02/2023   BUN 16 08/02/2023   CREATININE 1.32 (H) 08/02/2023   BILITOT 0.5 08/02/2023   ALKPHOS 62 08/02/2023   AST 25 08/02/2023   ALT 18 08/02/2023   PROT 7.1 08/02/2023   ALBUMIN 4.5 08/02/2023   CALCIUM  9.3 08/02/2023   ANIONGAP 8 11/13/2021   EGFR 42 (L) 08/02/2023   GFR 55.96 (L) 11/25/2019   Lab Results  Component Value Date   CHOL 136 08/02/2023   Lab Results  Component Value Date   HDL 45 08/02/2023   Lab Results  Component Value Date   LDLCALC 67 08/02/2023   Lab Results  Component Value Date   TRIG 137 08/02/2023   Lab Results  Component Value Date   CHOLHDL 3.0 08/02/2023   Lab Results  Component Value Date   HGBA1C 6.0 (H) 08/02/2023        Results for orders placed or performed in visit on 08/06/23  POCT URINALYSIS DIP (CLINITEK)   Collection Time: 08/06/23  3:59 PM  Result Value Ref Range   Color, UA yellow yellow   Clarity, UA clear clear   Glucose, UA negative negative mg/dL   Bilirubin, UA negative negative   Ketones, POC UA negative negative mg/dL   Spec Grav,  UA 8.989 1.010 - 1.025   Blood, UA negative negative   pH, UA 6.0 5.0 - 8.0   POC PROTEIN,UA negative negative, trace   Urobilinogen, UA 0.2 0.2 or 1.0 E.U./dL   Nitrite, UA Negative Negative   Leukocytes, UA Negative Negative     Assessment & Plan:   Assessment & Plan Acute non-recurrent maxillary sinusitis Acute sinusitis with cough Acute sinusitis with deep barking cough, nasal congestion, and headache. Cough likely from sinus drainage and inflammation. Better response to Augmentin . - Prescribe Augmentin  for sinusitis and cough. - Advise continued inhaler use for cough. - Instruct to report if cough persists after sinusitis clears. - Consider chest X-ray if deep cough persists post-sinusitis. - Prescribe cough suppressant with refills. Orders:   amoxicillin -clavulanate (AUGMENTIN ) 875-125 MG tablet; Take 1 tablet by mouth 2 (two) times  daily.   benzonatate  (TESSALON ) 200 MG capsule; Take 1 capsule (200 mg total) by mouth 3 (three) times daily as needed for cough.  Mild intermittent asthma without complication Asthma Asthma exacerbation with wheezing and increased inhaler use. Symptoms improved with inhalers and Dupixent  injections. - Continue current asthma management with inhalers. - Continue Dupixent  injections. - Monitor symptoms and report worsening.       Body mass index is 35.71 kg/m.SABRA    No orders of the defined types were placed in this encounter.   No orders of the defined types were placed in this encounter.    Follow-up: No follow-ups on file.  An After Visit Summary was printed and given to the patient.    I,Lauren M Auman,acting as a Neurosurgeon for [employer], [employer].,have documented all relevant documentation on the behalf of Nola Angles, PA,as directed by  Nola Angles, PA while in the presence of Nola Angles, [place].    Nola Angles, [place] [facility] [phone]

## 2023-10-31 ENCOUNTER — Ambulatory Visit

## 2023-11-02 ENCOUNTER — Encounter: Payer: Self-pay | Admitting: Physician Assistant

## 2023-11-05 ENCOUNTER — Ambulatory Visit (HOSPITAL_BASED_OUTPATIENT_CLINIC_OR_DEPARTMENT_OTHER)
Admission: RE | Admit: 2023-11-05 | Discharge: 2023-11-05 | Disposition: A | Source: Ambulatory Visit | Attending: Physician Assistant | Admitting: Radiology

## 2023-11-05 ENCOUNTER — Encounter: Payer: Self-pay | Admitting: Physician Assistant

## 2023-11-05 ENCOUNTER — Ambulatory Visit (INDEPENDENT_AMBULATORY_CARE_PROVIDER_SITE_OTHER): Admitting: Physician Assistant

## 2023-11-05 VITALS — BP 152/72 | HR 65 | Temp 97.5°F | Ht 61.0 in | Wt 191.0 lb

## 2023-11-05 DIAGNOSIS — J0101 Acute recurrent maxillary sinusitis: Secondary | ICD-10-CM

## 2023-11-05 DIAGNOSIS — J4520 Mild intermittent asthma, uncomplicated: Secondary | ICD-10-CM | POA: Diagnosis not present

## 2023-11-05 DIAGNOSIS — R059 Cough, unspecified: Secondary | ICD-10-CM | POA: Diagnosis not present

## 2023-11-05 DIAGNOSIS — R053 Chronic cough: Secondary | ICD-10-CM | POA: Diagnosis not present

## 2023-11-05 DIAGNOSIS — R0602 Shortness of breath: Secondary | ICD-10-CM

## 2023-11-05 DIAGNOSIS — J019 Acute sinusitis, unspecified: Secondary | ICD-10-CM | POA: Insufficient documentation

## 2023-11-05 DIAGNOSIS — I517 Cardiomegaly: Secondary | ICD-10-CM | POA: Diagnosis not present

## 2023-11-05 MED ORDER — LEVOFLOXACIN 500 MG PO TABS: 500.0000 mg | ORAL_TABLET | Freq: Every day | ORAL | 0 refills | Status: AC

## 2023-11-05 NOTE — Progress Notes (Signed)
 Acute Office Visit  Subjective:    Patient ID: Diana Hendrix, female    DOB: 03/15/1945, 78 y.o.   MRN: [id number]  Chief Complaint  Patient presents with   Still coughing/wheezing    HPI: Patient is in today for Worsening productive cough.   Discussed the use of AI scribe software for clinical note transcription with the patient, who gave verbal consent to proceed.  History of Present Illness Diana Hendrix is a 78 year old female with bronchiectasis who presents with worsening cough and congestion.  She has experienced worsening symptoms despite completing a course of antibiotics. Her symptoms include significant congestion, coughing, and a sensation of fullness in her sinuses, primarily on one side, with clear, sticky, thick nasal discharge. She occasionally coughs up phlegm, which she usually swallows, and suspects it is similar to the nasal discharge.  She has been using her inhaler, but her symptoms, particularly the cough, worsened over the weekend. No fevers have been experienced, and she has been using Tylenol  without a rise in temperature. Her husband recently had a cold, but his symptoms were different and he has since recovered.  She uses a Flutter valve twice daily and has been consistent with this since her symptoms began. Prior to the current exacerbation, she felt much better, although she still had some residual cough and congestion. She recalls a similar episode last spring, which was treated with Augmentin . She is currently taking Dupixent  and had a recent injection on Friday. She is becoming more comfortable with administering the injections herself.  She has been using Flonase  for her nasal congestion and reports a history of a deviated septum from a past nasal fracture. The right side of her nose remains more congested than the left. She has been using albuterol  once daily around lunchtime, taking two puffs, and has been waking up with difficulty  breathing, prompting her to use the inhaler in the morning as well.  She is scheduled to see her pulmonologist on December 4th and has an upcoming appointment with Dr. Charlanne for an endoscopy. She reports that the bone density test has not yet been scheduled and that the flu shot was missed at her last visit. She is scheduled to see Dr. Sherre next week for a three-month check-up.       Past Medical History:  Diagnosis Date   Asthma    Bronchiectasis (HCC)    Chronic right flank pain 12/04/2022   Diabetes mellitus without complication (HCC)    Diastolic heart failure (HCC)    Echo EF 60/65% Heart monitor was normal   GERD (gastroesophageal reflux disease)    History of colon polyps    Hyperlipidemia    Hypertension    Macular edema, cystoid    OSA (obstructive sleep apnea) 09/17/2012   CPAP   Osteopenia    Osteoporosis    Plantar fasciitis 09/09/2019   Pneumonia    Sleep apnea    Syncope, vasovagal 04/16/2017    Past Surgical History:  Procedure Laterality Date   CESAREAN SECTION     COLONOSCOPY  10/29/2013   Colonic polyp status post polypectomy. Mild sigmoid diverticulosis. Small internal hemorrhoids   FOOT NEUROMA SURGERY Bilateral    s/p surgery in both feet   MENISCUS REPAIR Left 09/13/2016   MENISCUS REPAIR Right 2019   SHOULDER ARTHROSCOPY Left    arthroscopy.    TUBAL LIGATION      Family History  Problem Relation Age of Onset   Hyperlipidemia  Mother    Heart disease Mother    Hypertension Mother    Atrial fibrillation Mother    CAD Mother    Congestive Heart Failure Mother    CVA Father    Hypertension Brother    Heart disease Maternal Grandmother    Heart disease Maternal Grandfather    Hypertension Son    Thyroid  disease Daughter    Colon cancer Neg Hx    Esophageal cancer Neg Hx     Social History   Socioeconomic History   Marital status: Married    Spouse name: Engineer, production   Number of children: 2   Years of education: Not on file    Highest education level: Associate degree: occupational, Scientist, product/process development, or vocational program  Occupational History   Occupation: retired  Tobacco Use   Smoking status: Never    Passive exposure: Past   Smokeless tobacco: Never  Vaping Use   Vaping status: Never Used  Substance and Sexual Activity   Alcohol use: No   Drug use: No   Sexual activity: Not Currently  Other Topics Concern   Not on file  Social History Narrative   Lives with husband   Right handed   Caffeine: 1 cup of tea and 1 pepsi in the AM   Social Drivers of Health   Financial Resource Strain: Low Risk  (09/18/2023)   Overall Financial Resource Strain (CARDIA)    Difficulty of Paying Living Expenses: Not hard at all  Food Insecurity: No Food Insecurity (09/18/2023)   Hunger Vital Sign    Worried About Running Out of Food in the Last Year: Never true    Ran Out of Food in the Last Year: Never true  Transportation Needs: No Transportation Needs (09/18/2023)   PRAPARE - Administrator, Civil Service (Medical): No    Lack of Transportation (Non-Medical): No  Physical Activity: Sufficiently Active (09/18/2023)   Exercise Vital Sign    Days of Exercise per Week: 3 days    Minutes of Exercise per Session: 60 min  Stress: No Stress Concern Present (09/18/2023)   [employer] of Occupational Health - Occupational Stress Questionnaire    Feeling of Stress: Not at all  Social Connections: Moderately Integrated (09/18/2023)   Social Connection and Isolation Panel    Frequency of Communication with Friends and Family: More than three times a week    Frequency of Social Gatherings with Friends and Family: More than three times a week    Attends Religious Services: More than 4 times per year    Active Member of [employer] or Organizations: No    Attends Banker Meetings: Not on file    Marital Status: Married  Catering manager Violence: Not At Risk (05/23/2023)   Humiliation, Afraid, Rape, and Kick  questionnaire    Fear of Current or Ex-Partner: No    Emotionally Abused: No    Physically Abused: No    Sexually Abused: No    Outpatient Medications Prior to Visit  Medication Sig Dispense Refill   albuterol  (VENTOLIN  HFA) 108 (90 Base) MCG/ACT inhaler Inhale 2 puffs into the lungs every 6 (six) hours as needed for wheezing or shortness of breath. 16 g 3   benzonatate  (TESSALON ) 200 MG capsule Take 1 capsule (200 mg total) by mouth 3 (three) times daily as needed for cough. 30 capsule 3   Cholecalciferol (VITAMIN D3) 50 MCG (2000 UT) TABS Take 1 tablet by mouth daily.     citalopram  (CELEXA )  10 MG tablet TAKE 1 TABLET(10 MG) BY MOUTH DAILY 90 tablet 1   diltiazem  (CARDIZEM  CD) 180 MG 24 hr capsule Take 1 capsule (180 mg total) by mouth daily. 90 capsule 0   Dupilumab  (DUPIXENT ) 300 MG/2ML SOAJ Inject 300 mg into the skin every 14 (fourteen) days. 12 mL 1   famotidine  (PEPCID ) 40 MG tablet Take 1 tablet (40 mg total) by mouth daily. 90 tablet 1   fluticasone  (FLONASE ) 50 MCG/ACT nasal spray Place 2 sprays into both nostrils daily. 16 g 6   furosemide  (LASIX ) 20 MG tablet Take 1 tablet (20 mg total) by mouth daily. Patient needs to make an appointment for further refills. 1st attempt. 90 tablet 3   montelukast  (SINGULAIR ) 10 MG tablet Take 1 tablet (10 mg total) by mouth at bedtime. 90 tablet 3   NON FORMULARY CPAP at bedtime     olmesartan  (BENICAR ) 20 MG tablet Take 10 mg by mouth daily.     omega-3 acid ethyl esters (LOVAZA ) 1 g capsule TAKE 2 CAPSULES BY MOUTH TWICE DAILY 360 capsule 2   pantoprazole  (PROTONIX ) 40 MG tablet Take 1 tablet (40 mg total) by mouth 2 (two) times daily. 90 tablet 3   Polyethyl Glycol-Propyl Glycol (SYSTANE OP) Place 1 drop into both eyes 4 (four) times daily.     polyethylene glycol (MIRALAX  / GLYCOLAX ) 17 g packet Take 17 g by mouth daily.     rosuvastatin  (CRESTOR ) 40 MG tablet TAKE 1 TABLET(40 MG) BY MOUTH DAILY 90 tablet 1   senna (SENOKOT) 8.6 MG tablet  Take 1 tablet by mouth daily.     Tiotropium Bromide-Olodaterol (STIOLTO RESPIMAT ) 2.5-2.5 MCG/ACT AERS Inhale 2 puffs into the lungs daily. 1 each 11   zolpidem  (AMBIEN ) 5 MG tablet TAKE 1 TABLET(5 MG) BY MOUTH AT BEDTIME AS NEEDED FOR SLEEP 30 tablet 0   amoxicillin -clavulanate (AUGMENTIN ) 875-125 MG tablet Take 1 tablet by mouth 2 (two) times daily. 14 tablet 0   Facility-Administered Medications Prior to Visit  Medication Dose Route Frequency Provider Last Rate Last Admin   [START ON 11/09/2023] denosumab  (PROLIA ) injection 60 mg  60 mg Subcutaneous Once Cox, Abigail, MD        Allergies  Allergen Reactions   Cortisone Other (See Comments)    Passed out according to patient can take prednisone    Glucosamine Other (See Comments)   Zyrtec [Cetirizine]     Weird dreams.    Ibuprofen Other (See Comments)    Pt states Dr doesn't wont her to take it; not allergic, no NSAIDS    Review of Systems  Constitutional:  Negative for appetite change, fatigue and fever.  HENT:  Negative for congestion, ear pain, sinus pressure and sore throat.   Respiratory:  Positive for cough and wheezing. Negative for chest tightness and shortness of breath.   Cardiovascular:  Negative for chest pain and palpitations.  Gastrointestinal:  Negative for abdominal pain, constipation, diarrhea, nausea and vomiting.  Genitourinary:  Negative for dysuria and hematuria.  Musculoskeletal:  Negative for arthralgias, back pain, joint swelling and myalgias.  Skin:  Negative for rash.  Neurological:  Negative for dizziness, weakness and headaches.  Psychiatric/Behavioral:  Negative for dysphoric mood. The patient is not nervous/anxious.        Objective:        11/05/2023   10:17 AM 10/26/2023   10:12 AM 10/19/2023   10:18 AM  Vitals with BMI  Height 5' 1 5' 1 5' 1  Weight 191  lbs 189 lbs 191 lbs 4 oz  BMI 36.11 35.73 36.15  Systolic 152 152 887  Diastolic 72 72 60  Pulse 65 77 72    Orthostatic VS  for the past 72 hrs (Last 3 readings):  Patient Position BP Location  11/05/23 1017 Sitting Left Arm     Physical Exam Vitals reviewed.  Constitutional:      Appearance: Normal appearance.  Neck:     Vascular: No carotid bruit.  Cardiovascular:     Rate and Rhythm: Normal rate and regular rhythm.     Heart sounds: Normal heart sounds.  Pulmonary:     Effort: Pulmonary effort is normal.     Breath sounds: Rhonchi present. No wheezing.  Abdominal:     General: Bowel sounds are normal.     Palpations: Abdomen is soft.     Tenderness: There is no abdominal tenderness.  Neurological:     Mental Status: She is alert and oriented to person, place, and time.  Psychiatric:        Mood and Affect: Mood normal.        Behavior: Behavior normal.     Health Maintenance Due  Topic Date Due   Zoster Vaccines- Shingrix (2 of 2) 04/16/2021   Diabetic kidney evaluation - Urine ACR  02/02/2023   Influenza Vaccine  08/17/2023   COVID-19 Vaccine (8 - 2025-26 season) 09/17/2023    There are no preventive care reminders to display for this patient.   Lab Results  Component Value Date   TSH 1.810 02/09/2021   Lab Results  Component Value Date   WBC 6.5 08/02/2023   HGB 13.8 08/02/2023   HCT 42.2 08/02/2023   MCV 92 08/02/2023   PLT 226 08/02/2023   Lab Results  Component Value Date   NA 138 08/02/2023   K 4.5 08/02/2023   CO2 21 08/02/2023   GLUCOSE 102 (H) 08/02/2023   BUN 16 08/02/2023   CREATININE 1.32 (H) 08/02/2023   BILITOT 0.5 08/02/2023   ALKPHOS 62 08/02/2023   AST 25 08/02/2023   ALT 18 08/02/2023   PROT 7.1 08/02/2023   ALBUMIN 4.5 08/02/2023   CALCIUM  9.3 08/02/2023   ANIONGAP 8 11/13/2021   EGFR 42 (L) 08/02/2023   GFR 55.96 (L) 11/25/2019   Lab Results  Component Value Date   CHOL 136 08/02/2023   Lab Results  Component Value Date   HDL 45 08/02/2023   Lab Results  Component Value Date   LDLCALC 67 08/02/2023   Lab Results  Component Value  Date   TRIG 137 08/02/2023   Lab Results  Component Value Date   CHOLHDL 3.0 08/02/2023   Lab Results  Component Value Date   HGBA1C 6.0 (H) 08/02/2023        Results for orders placed or performed in visit on 08/06/23  POCT URINALYSIS DIP (CLINITEK)   Collection Time: 08/06/23  3:59 PM  Result Value Ref Range   Color, UA yellow yellow   Clarity, UA clear clear   Glucose, UA negative negative mg/dL   Bilirubin, UA negative negative   Ketones, POC UA negative negative mg/dL   Spec Grav, UA 8.989 8.989 - 1.025   Blood, UA negative negative   pH, UA 6.0 5.0 - 8.0   POC PROTEIN,UA negative negative, trace   Urobilinogen, UA 0.2 0.2 or 1.0 E.U./dL   Nitrite, UA Negative Negative   Leukocytes, UA Negative Negative   Total time spent on today's visit was  20 minutes, including both face-to-face time and nonface-to-face time personally spent on review of chart (labs and imaging), discussing labs and goals, discussing further work-up, treatment options, referrals to specialist if needed, reviewing outside records of pertinent, answering patient's questions, and coordinating care.   Assessment & Plan:   Assessment & Plan Mild intermittent asthma without complication Acute exacerbation of bronchiectasis with asthma symptoms, limited relief from inhalers, concern for pneumonia. - Order chest x-ray to assess for pneumonia or lung changes. - Prescribe levofloxacin  for exacerbation. - Instruct to use albuterol  inhaler morning and evening. - Continue using Flutter valve twice daily. - Continue Dupixent  as prescribed. Orders:   DG Chest 2 View; Future   levofloxacin  (LEVAQUIN ) 500 MG tablet; Take 1 tablet (500 mg total) by mouth daily for 10 days.  Acute recurrent maxillary sinusitis Acute right-sided sinusitis Symptoms consistent with acute right-sided sinusitis, levofloxacin  expected to address condition. - Prescribe levofloxacin  for sinusitis. - Continue using Flonase  for nasal  congestion.      Body mass index is 36.09 kg/m..   Meds ordered this encounter  Medications   levofloxacin  (LEVAQUIN ) 500 MG tablet    Sig: Take 1 tablet (500 mg total) by mouth daily for 10 days.    Dispense:  10 tablet    Refill:  0    Orders Placed This Encounter  Procedures   DG Chest 2 View     Follow-up: No follow-ups on file.  An After Visit Summary was printed and given to the patient.    I,Lauren M Auman,acting as a Neurosurgeon for [employer], [employer].,have documented all relevant documentation on the behalf of Diana Angles, PA,as directed by  Diana Angles, PA while in the presence of Diana Hendrix, [place].   Diana Hendrix, [place] [facility] [phone]

## 2023-11-05 NOTE — Assessment & Plan Note (Signed)
 Acute right-sided sinusitis Symptoms consistent with acute right-sided sinusitis, levofloxacin  expected to address condition. - Prescribe levofloxacin  for sinusitis. - Continue using Flonase  for nasal congestion.

## 2023-11-05 NOTE — Assessment & Plan Note (Signed)
 Acute exacerbation of bronchiectasis with asthma symptoms, limited relief from inhalers, concern for pneumonia. - Order chest x-ray to assess for pneumonia or lung changes. - Prescribe levofloxacin  for exacerbation. - Instruct to use albuterol  inhaler morning and evening. - Continue using Flutter valve twice daily. - Continue Dupixent  as prescribed. Orders:   DG Chest 2 View; Future   levofloxacin  (LEVAQUIN ) 500 MG tablet; Take 1 tablet (500 mg total) by mouth daily for 10 days.

## 2023-11-09 ENCOUNTER — Ambulatory Visit: Payer: Self-pay | Admitting: Physician Assistant

## 2023-11-09 ENCOUNTER — Other Ambulatory Visit

## 2023-11-09 DIAGNOSIS — E782 Mixed hyperlipidemia: Secondary | ICD-10-CM

## 2023-11-09 DIAGNOSIS — N1832 Chronic kidney disease, stage 3b: Secondary | ICD-10-CM

## 2023-11-09 DIAGNOSIS — E1159 Type 2 diabetes mellitus with other circulatory complications: Secondary | ICD-10-CM | POA: Diagnosis not present

## 2023-11-09 DIAGNOSIS — I152 Hypertension secondary to endocrine disorders: Secondary | ICD-10-CM | POA: Diagnosis not present

## 2023-11-09 LAB — COMPREHENSIVE METABOLIC PANEL WITH GFR
ALT: 18 IU/L (ref 0–32)
AST: 23 IU/L (ref 0–40)
Albumin: 4.4 g/dL (ref 3.8–4.8)
Alkaline Phosphatase: 63 IU/L (ref 49–135)
BUN/Creatinine Ratio: 11 — ABNORMAL LOW (ref 12–28)
BUN: 13 mg/dL (ref 8–27)
Bilirubin Total: 0.4 mg/dL (ref 0.0–1.2)
CO2: 20 mmol/L (ref 20–29)
Calcium: 9.4 mg/dL (ref 8.7–10.3)
Chloride: 101 mmol/L (ref 96–106)
Creatinine, Ser: 1.23 mg/dL — ABNORMAL HIGH (ref 0.57–1.00)
Globulin, Total: 2.7 g/dL (ref 1.5–4.5)
Glucose: 102 mg/dL — ABNORMAL HIGH (ref 70–99)
Potassium: 4.4 mmol/L (ref 3.5–5.2)
Sodium: 135 mmol/L (ref 134–144)
Total Protein: 7.1 g/dL (ref 6.0–8.5)
eGFR: 45 mL/min/1.73 — ABNORMAL LOW (ref >59)

## 2023-11-09 LAB — CBC WITH DIFFERENTIAL/PLATELET
Basophils Absolute: 0.1 x10E3/uL (ref 0.0–0.2)
Basos: 1 %
EOS (ABSOLUTE): 0.2 x10E3/uL (ref 0.0–0.4)
Eos: 3 %
Hematocrit: 42.9 % (ref 34.0–46.6)
Hemoglobin: 14.1 g/dL (ref 11.1–15.9)
Immature Grans (Abs): 0 x10E3/uL (ref 0.0–0.1)
Immature Granulocytes: 0 %
Lymphocytes Absolute: 2.2 x10E3/uL (ref 0.7–3.1)
Lymphs: 32 %
MCH: 29.8 pg (ref 26.6–33.0)
MCHC: 32.9 g/dL (ref 31.5–35.7)
MCV: 91 fL (ref 79–97)
Monocytes Absolute: 0.5 x10E3/uL (ref 0.1–0.9)
Monocytes: 8 %
Neutrophils Absolute: 3.8 x10E3/uL (ref 1.4–7.0)
Neutrophils: 56 %
Platelets: 253 x10E3/uL (ref 150–450)
RBC: 4.73 x10E6/uL (ref 3.77–5.28)
RDW: 13.4 % (ref 11.7–15.4)
WBC: 6.8 x10E3/uL (ref 3.4–10.8)

## 2023-11-09 LAB — LIPID PANEL
Chol/HDL Ratio: 2.6 ratio (ref 0.0–4.4)
Cholesterol, Total: 133 mg/dL (ref 100–199)
HDL: 52 mg/dL (ref >39)
LDL Chol Calc (NIH): 62 mg/dL (ref 0–99)
Triglycerides: 103 mg/dL (ref 0–149)
VLDL Cholesterol Cal: 19 mg/dL (ref 5–40)

## 2023-11-09 LAB — HEMOGLOBIN A1C
Est. average glucose Bld gHb Est-mCnc: 128 mg/dL
Hgb A1c MFr Bld: 6.1 % — ABNORMAL HIGH (ref 4.8–5.6)

## 2023-11-10 ENCOUNTER — Other Ambulatory Visit: Payer: Self-pay | Admitting: Family Medicine

## 2023-11-10 DIAGNOSIS — E782 Mixed hyperlipidemia: Secondary | ICD-10-CM

## 2023-11-11 ENCOUNTER — Ambulatory Visit: Payer: Self-pay | Admitting: Family Medicine

## 2023-11-12 NOTE — Progress Notes (Signed)
 "  Subjective:  Patient ID: Diana Hendrix, female    DOB: [birth year]  Age: 78 y.o. MRN: [id number]  Chief Complaint  Patient presents with   Medical Management of Chronic Issues    HPI: Discussed the use of AI scribe software for clinical note transcription with the patient, who gave verbal consent to proceed.  History of Present Illness Diana Hendrix is a 78 year old female with bronchiectasis who presents with persistent respiratory symptoms.  Respiratory symptoms - Persistent excessive mucus production for four weeks, described as clear, thick, and stretchy - Frequent nasal congestion and nose blowing with associated irritation - Chest tightness and significant chest pain during flare-ups - Symptoms worsen at night, causing difficulty breathing when lying down and necessitating sleeping in an upright position - Recurring symptoms for approximately one year with intermittent flare-ups - No recent use of prednisone , but has used it in the past - Sensation of a lump in the throat, prompting upcoming endoscopy  Bronchiectasis management - Uses albuterol  inhaler twice daily - Utilizes a flutter valve for airway clearance - Receives Dupixent  as part of her regimen - Performs daily sinus rinse, sometimes twice daily - Uses Flonase  nasal spray, with uncertain effectiveness - Completed two courses of antibiotics, including Levaquin , with persistent symptoms but some improvement  Cardiovascular history - History of enlarged heart - Previous fainting episodes, prompting coronary CTA and echocardiogram - Echocardiogram revealed mild enlargement and impaired relaxation - No recent fainting episodes  Antihypertensive therapy - Takes Benicar  (olmesartan ) 10 mg, cut from a 20 mg tablet, due to prior low blood pressure readings       11/13/2023    1:42 PM 08/06/2023    2:37 PM 05/23/2023    9:03 AM 05/03/2023    8:51 AM 03/20/2023    4:15 PM  Depression screen PHQ 2/9   Decreased Interest 0 1 0 0 0  Down, Depressed, Hopeless 0 0 0 0 0  PHQ - 2 Score 0 1 0 0 0  Altered sleeping 0 1  0 0  Tired, decreased energy 0 0  0 0  Change in appetite 0 0  0 0  Feeling bad or failure about yourself  0 0  0 0  Trouble concentrating 0 0  0 0  Moving slowly or fidgety/restless 0 0  0 0  Suicidal thoughts 0 0  0 0  PHQ-9 Score 0 2  0 0  Difficult doing work/chores Not difficult at all Not difficult at all Not difficult at all Not difficult at all Not difficult at all        09/19/2023    9:42 AM  Fall Risk   Falls in the past year? 0  Number falls in past yr: 0  Injury with Fall? 0  Risk for fall due to : No Fall Risks  Follow up Falls evaluation completed    Patient Care Team: Sherre Clapper, MD as PCP - General (Family Medicine) Waylan Cain, MD as Consulting Physician (Ophthalmology) Monetta Redell PARAS, MD as Consulting Physician (Cardiology) Charlanne Groom, MD as Consulting Physician (Gastroenterology) Hunsucker, Donnice SAUNDERS, MD as Consulting Physician (Pulmonary Disease)   Review of Systems  Constitutional:  Negative for chills and fever.  HENT:  Positive for congestion and voice change. Negative for sinus pressure, sinus pain and sore throat.   Respiratory:  Positive for cough, chest tightness and shortness of breath.   Cardiovascular:  Negative for chest pain.  Gastrointestinal:  Negative for abdominal pain,  constipation, diarrhea, nausea and vomiting.  Endocrine: Negative for polydipsia, polyphagia and polyuria.  Genitourinary:  Negative for dysuria.  Musculoskeletal:  Negative for arthralgias, back pain and myalgias.  Neurological:  Negative for headaches.  Psychiatric/Behavioral:  Negative for dysphoric mood.     Current Outpatient Medications on File Prior to Visit  Medication Sig Dispense Refill   albuterol  (VENTOLIN  HFA) 108 (90 Base) MCG/ACT inhaler Inhale 2 puffs into the lungs every 6 (six) hours as needed for wheezing or shortness of breath.  16 g 3   benzonatate  (TESSALON ) 200 MG capsule Take 1 capsule (200 mg total) by mouth 3 (three) times daily as needed for cough. 30 capsule 3   Cholecalciferol (VITAMIN D3) 50 MCG (2000 UT) TABS Take 1 tablet by mouth daily.     citalopram  (CELEXA ) 10 MG tablet TAKE 1 TABLET(10 MG) BY MOUTH DAILY 90 tablet 1   Dupilumab  (DUPIXENT ) 300 MG/2ML SOAJ Inject 300 mg into the skin every 14 (fourteen) days. 12 mL 1   famotidine  (PEPCID ) 40 MG tablet Take 1 tablet (40 mg total) by mouth daily. 90 tablet 1   fluticasone  (FLONASE ) 50 MCG/ACT nasal spray Place 2 sprays into both nostrils daily. 16 g 6   furosemide  (LASIX ) 20 MG tablet Take 1 tablet (20 mg total) by mouth daily. Patient needs to make an appointment for further refills. 1st attempt. 90 tablet 3   levofloxacin  (LEVAQUIN ) 500 MG tablet Take 1 tablet (500 mg total) by mouth daily for 10 days. 10 tablet 0   montelukast  (SINGULAIR ) 10 MG tablet Take 1 tablet (10 mg total) by mouth at bedtime. 90 tablet 3   NON FORMULARY CPAP at bedtime     olmesartan  (BENICAR ) 20 MG tablet Take 10 mg by mouth daily.     omega-3 acid ethyl esters (LOVAZA ) 1 g capsule TAKE 2 CAPSULES BY MOUTH TWICE DAILY 360 capsule 2   pantoprazole  (PROTONIX ) 40 MG tablet Take 1 tablet (40 mg total) by mouth 2 (two) times daily. 90 tablet 3   Polyethyl Glycol-Propyl Glycol (SYSTANE OP) Place 1 drop into both eyes 4 (four) times daily.     polyethylene glycol (MIRALAX  / GLYCOLAX ) 17 g packet Take 17 g by mouth daily.     rosuvastatin  (CRESTOR ) 40 MG tablet TAKE 1 TABLET(40 MG) BY MOUTH DAILY 90 tablet 1   senna (SENOKOT) 8.6 MG tablet Take 1 tablet by mouth daily.     Tiotropium Bromide-Olodaterol (STIOLTO RESPIMAT ) 2.5-2.5 MCG/ACT AERS Inhale 2 puffs into the lungs daily. 1 each 11   No current facility-administered medications on file prior to visit.   Past Medical History:  Diagnosis Date   Asthma    Bronchiectasis (HCC)    Chronic right flank pain 12/04/2022   Diabetes  mellitus without complication (HCC)    Diastolic heart failure (HCC)    Echo EF 60/65% Heart monitor was normal   GERD (gastroesophageal reflux disease)    History of colon polyps    Hyperlipidemia    Hypertension    Macular edema, cystoid    OSA (obstructive sleep apnea) 09/17/2012   CPAP   Osteopenia    Osteoporosis    Plantar fasciitis 09/09/2019   Pneumonia    Sleep apnea    Syncope, vasovagal 04/16/2017   Past Surgical History:  Procedure Laterality Date   CESAREAN SECTION     COLONOSCOPY  10/29/2013   Colonic polyp status post polypectomy. Mild sigmoid diverticulosis. Small internal hemorrhoids   FOOT NEUROMA SURGERY Bilateral  s/p surgery in both feet   MENISCUS REPAIR Left 09/13/2016   MENISCUS REPAIR Right 2019   SHOULDER ARTHROSCOPY Left    arthroscopy.    TUBAL LIGATION      Family History  Problem Relation Age of Onset   Hyperlipidemia Mother    Heart disease Mother    Hypertension Mother    Atrial fibrillation Mother    CAD Mother    Congestive Heart Failure Mother    CVA Father    Hypertension Brother    Heart disease Maternal Grandmother    Heart disease Maternal Grandfather    Hypertension Son    Thyroid  disease Daughter    Colon cancer Neg Hx    Esophageal cancer Neg Hx    Social History   Socioeconomic History   Marital status: Married    Spouse name: Engineer, Production   Number of children: 2   Years of education: Not on file   Highest education level: Associate degree: academic program  Occupational History   Occupation: retired  Tobacco Use   Smoking status: Never    Passive exposure: Past   Smokeless tobacco: Never  Vaping Use   Vaping status: Never Used  Substance and Sexual Activity   Alcohol use: No   Drug use: No   Sexual activity: Not Currently  Other Topics Concern   Not on file  Social History Narrative   Lives with husband   Right handed   Caffeine: 1 cup of tea and 1 pepsi in the AM   Social Drivers of Health    Financial Resource Strain: Low Risk  (11/11/2023)   Overall Financial Resource Strain (CARDIA)    Difficulty of Paying Living Expenses: Not hard at all  Food Insecurity: No Food Insecurity (11/11/2023)   Hunger Vital Sign    Worried About Running Out of Food in the Last Year: Never true    Ran Out of Food in the Last Year: Never true  Transportation Needs: No Transportation Needs (11/11/2023)   PRAPARE - Administrator, Civil Service (Medical): No    Lack of Transportation (Non-Medical): No  Physical Activity: Sufficiently Active (11/11/2023)   Exercise Vital Sign    Days of Exercise per Week: 3 days    Minutes of Exercise per Session: 60 min  Stress: Stress Concern Present (11/11/2023)   [employer] of Occupational Health - Occupational Stress Questionnaire    Feeling of Stress: Rather much  Social Connections: Socially Integrated (11/11/2023)   Social Connection and Isolation Panel    Frequency of Communication with Friends and Family: Three times a week    Frequency of Social Gatherings with Friends and Family: Once a week    Attends Religious Services: 1 to 4 times per year    Active Member of [employer] or Organizations: Yes    Attends Engineer, Structural: More than 4 times per year    Marital Status: Married    Objective:  BP 118/64   Pulse 80   Temp 98.3 F (36.8 C)   Ht 5' 1 (1.549 m)   Wt 188 lb (85.3 kg)   SpO2 96%   BMI 35.52 kg/m      11/13/2023    1:39 PM 11/05/2023   10:17 AM 10/26/2023   10:12 AM  BP/Weight  Systolic BP 118 152 152  Diastolic BP 64 72 72  Wt. (Lbs) 188 191 189  BMI 35.52 kg/m2 36.09 kg/m2 35.71 kg/m2    Physical Exam Vitals reviewed.  Constitutional:      Appearance: Normal appearance.  HENT:     Right Ear: Tympanic membrane, ear canal and external ear normal.     Left Ear: Tympanic membrane, ear canal and external ear normal.     Nose: Congestion and rhinorrhea present.     Right Turbinates:  Enlarged, swollen and pale.     Left Turbinates: Enlarged, swollen and pale.     Mouth/Throat:     Pharynx: Oropharynx is clear.  Neck:     Vascular: No carotid bruit.  Cardiovascular:     Rate and Rhythm: Normal rate and regular rhythm.     Pulses: Normal pulses.     Heart sounds: Normal heart sounds. No murmur heard. Pulmonary:     Effort: Pulmonary effort is normal. No respiratory distress.     Breath sounds: Normal breath sounds.  Lymphadenopathy:     Cervical: No cervical adenopathy.  Neurological:     Mental Status: She is alert and oriented to person, place, and time.  Psychiatric:        Mood and Affect: Mood normal.        Behavior: Behavior normal.      Diabetic foot exam was performed with the following findings:   No deformities, ulcerations, or other skin breakdown Normal sensation of 10g monofilament Intact posterior tibialis and dorsalis pedis pulses      Lab Results  Component Value Date   WBC 6.8 11/09/2023   HGB 14.1 11/09/2023   HCT 42.9 11/09/2023   PLT 253 11/09/2023   GLUCOSE 102 (H) 11/09/2023   CHOL 133 11/09/2023   TRIG 103 11/09/2023   HDL 52 11/09/2023   LDLCALC 62 11/09/2023   ALT 18 11/09/2023   AST 23 11/09/2023   NA 135 11/09/2023   K 4.4 11/09/2023   CL 101 11/09/2023   CREATININE 1.23 (H) 11/09/2023   BUN 13 11/09/2023   CO2 20 11/09/2023   TSH 1.810 02/09/2021   INR 1.1 11/09/2021   HGBA1C 6.1 (H) 11/09/2023    Results for orders placed or performed in visit on 11/13/23  Microalbumin/Creatinine Ratio, Urine   Collection Time: 11/13/23 12:00 PM  Result Value Ref Range   Creatinine, Urine 26.3 Not Estab. mg/dL   Microalbumin, Urine 33.3 Not Estab. ug/mL   Microalb/Creat Ratio 253 (H) 0 - 29 mg/g creat  .  Assessment & Plan:   Assessment & Plan Hypertension associated with diabetes (HCC) Blood pressure well-controlled with olmesartan  and diltiazem . - Continue olmesartan  10 mg daily. - Continue diltiazem  180 mg  daily.  Hemoglobin A1c is 6.1. No hyperglycemia symptoms. Not checking blood sugars. - Encourage hydration. - Check feet daily for sores. Orders:   diltiazem  (CARDIZEM  CD) 180 MG 24 hr capsule; Take 1 capsule (180 mg total) by mouth daily.   Microalbumin/Creatinine Ratio, Urine  Osteoporosis of vertebra Osteoporosis confirmed by previous imaging.  On Prolia  every six months. Due for bone density scan. - Schedule bone density scan at the new medical center - Continue Prolia  every six months Orders:   denosumab  (PROLIA ) injection 60 mg   Non-seasonal allergic rhinitis due to other allergic trigger Persistent nasal congestion and drainage. Current treatment includes Flonase . - Prescribe azelastine  nasal spray. - Continue Flonase  nasal spray twice daily. Orders:   azelastine  (ASTELIN ) 0.1 % nasal spray; Place 1 spray into both nostrils 2 (two) times daily. Use in each nostril as directed   predniSONE  (DELTASONE ) 10 MG tablet; Take 6 tablets (60 mg total) by  mouth daily with breakfast for 1 day, THEN 5 tablets (50 mg total) daily with breakfast for 1 day, THEN 4 tablets (40 mg total) daily with breakfast for 1 day, THEN 3 tablets (30 mg total) daily with breakfast for 1 day, THEN 2 tablets (20 mg total) daily with breakfast for 1 day, THEN 1 tablet (10 mg total) daily with breakfast for 1 day.   triamcinolone  acetonide (KENALOG -40) injection 80 mg  Severe persistent asthma without complication (HCC) Asthma exacerbation with chest tightness and shortness of breath. Symptoms ongoing for a year with flare-ups. Current treatment includes albuterol  and Stiolto. - Administer Kenalog  shot. - Prescribe prednisone  course. - Continue albuterol  inhaler twice daily. - Continue Stiolto. - Follow up with lung specialist on November 18. Orders:   triamcinolone  acetonide (KENALOG -40) injection 80 mg  Bronchiectasis with acute exacerbation (HCC) Chronic cough and congestion with mucus production and  breathing difficulty. Recent chest x-ray clear. Previous treatment with Levaquin  and albuterol  improved symptoms. - Administer Kenalog  shot. - Prescribe prednisone  course. - Continue albuterol  inhaler twice daily. - Use flutter valve. - Continue Dupixent . - Consider azelastine  nasal spray in addition to Flonase .    Stage 3b chronic kidney disease (HCC) Kidney dysfunction well-managed. - Encourage hydration.    Mixed hyperlipidemia Cholesterol levels well-controlled with rosuvastatin . - Continue rosuvastatin  40 mg daily.    Gastroesophageal reflux disease without esophagitis Current treatment includes famotidine  and pantoprazole . - Continue famotidine  40 mg daily. - Continue pantoprazole  twice daily.    Mild recurrent major depression Current treatment includes citalopram . Concerns about sleep medication efficacy. Discussed risks of stronger sleep aids due to respiratory suppression. - Continue citalopram  10 mg daily. - Consider alternative sleep aid if needed. Rx: belsomra  sent.     Obesity, morbid (HCC) Bmi 35, associated comorbidity: diabetes.  Recommend continue to work on eating healthy diet and exercise.'    Body mass index is 35.52 kg/m.    Meds ordered this encounter  Medications   diltiazem  (CARDIZEM  CD) 180 MG 24 hr capsule    Sig: Take 1 capsule (180 mg total) by mouth daily.    Dispense:  90 capsule    Refill:  0   denosumab  (PROLIA ) injection 60 mg    Patient is enrolled in [facility] program for this medication and I have provided a copy of the Prolia  Medication Guide and Patient Brochure.:   No    I have reviewed with the patient the information in the Prolia  Medication Guide and Patient Counseling Chart including the serious risks of Prolia  and symptoms of each risk.:   Yes    I have advised the patient to seek medical attention if they have signs or symptoms of any of the serious risks.:   Yes   azelastine  (ASTELIN ) 0.1 % nasal spray    Sig: Place 1 spray  into both nostrils 2 (two) times daily. Use in each nostril as directed    Dispense:  30 mL    Refill:  2   predniSONE  (DELTASONE ) 10 MG tablet    Sig: Take 6 tablets (60 mg total) by mouth daily with breakfast for 1 day, THEN 5 tablets (50 mg total) daily with breakfast for 1 day, THEN 4 tablets (40 mg total) daily with breakfast for 1 day, THEN 3 tablets (30 mg total) daily with breakfast for 1 day, THEN 2 tablets (20 mg total) daily with breakfast for 1 day, THEN 1 tablet (10 mg total) daily with breakfast for 1 day.  Dispense:  21 tablet    Refill:  0   triamcinolone  acetonide (KENALOG -40) injection 80 mg   Suvorexant  (BELSOMRA ) 10 MG TABS    Sig: Take 1 tablet (10 mg total) by mouth at bedtime as needed.    Dispense:  30 tablet    Refill:  3    Orders Placed This Encounter  Procedures   Microalbumin/Creatinine Ratio, Urine     I,Marla I Leal-Borjas,acting as a scribe for Abigail Free, MD.,have documented all relevant documentation on the behalf of Abigail Free, MD,as directed by  Abigail Free, MD while in the presence of Abigail Free, MD.   Follow-up: Return in about 3 months (around 02/13/2024) for chronic follow up.  An After Visit Summary was printed and given to the patient.  Abigail Free, MD [facility] [phone] "

## 2023-11-13 ENCOUNTER — Encounter: Payer: Self-pay | Admitting: Family Medicine

## 2023-11-13 ENCOUNTER — Ambulatory Visit: Admitting: Family Medicine

## 2023-11-13 VITALS — BP 118/64 | HR 80 | Temp 98.3°F | Ht 61.0 in | Wt 188.0 lb

## 2023-11-13 DIAGNOSIS — M810 Age-related osteoporosis without current pathological fracture: Secondary | ICD-10-CM | POA: Diagnosis not present

## 2023-11-13 DIAGNOSIS — I152 Hypertension secondary to endocrine disorders: Secondary | ICD-10-CM | POA: Diagnosis not present

## 2023-11-13 DIAGNOSIS — J471 Bronchiectasis with (acute) exacerbation: Secondary | ICD-10-CM

## 2023-11-13 DIAGNOSIS — F330 Major depressive disorder, recurrent, mild: Secondary | ICD-10-CM | POA: Diagnosis not present

## 2023-11-13 DIAGNOSIS — N1832 Chronic kidney disease, stage 3b: Secondary | ICD-10-CM

## 2023-11-13 DIAGNOSIS — E1159 Type 2 diabetes mellitus with other circulatory complications: Secondary | ICD-10-CM

## 2023-11-13 DIAGNOSIS — J4550 Severe persistent asthma, uncomplicated: Secondary | ICD-10-CM

## 2023-11-13 DIAGNOSIS — J3089 Other allergic rhinitis: Secondary | ICD-10-CM | POA: Diagnosis not present

## 2023-11-13 DIAGNOSIS — E782 Mixed hyperlipidemia: Secondary | ICD-10-CM | POA: Diagnosis not present

## 2023-11-13 DIAGNOSIS — K219 Gastro-esophageal reflux disease without esophagitis: Secondary | ICD-10-CM

## 2023-11-13 MED ORDER — DILTIAZEM HCL ER COATED BEADS 180 MG PO CP24: 180.0000 mg | ORAL_CAPSULE | Freq: Every day | ORAL | 0 refills | Status: DC

## 2023-11-13 MED ORDER — DENOSUMAB 60 MG/ML ~~LOC~~ SOSY: 60.0000 mg | PREFILLED_SYRINGE | Freq: Once | SUBCUTANEOUS | Status: AC

## 2023-11-13 MED ORDER — BELSOMRA 10 MG PO TABS: 10.0000 mg | ORAL_TABLET | Freq: Every evening | ORAL | 3 refills | Status: DC | PRN

## 2023-11-13 MED ORDER — PREDNISONE 10 MG PO TABS: ORAL_TABLET | ORAL | 0 refills | Status: AC

## 2023-11-13 MED ORDER — AZELASTINE HCL 0.1 % NA SOLN: 1.0000 | Freq: Two times a day (BID) | NASAL | 2 refills | Status: DC

## 2023-11-13 MED ORDER — TRIAMCINOLONE ACETONIDE 40 MG/ML IJ SUSP
80.0000 mg | Freq: Once | INTRAMUSCULAR | Status: AC
  Administered 2023-11-13: 80 mg via INTRAMUSCULAR

## 2023-11-13 NOTE — Assessment & Plan Note (Signed)
 Orders:    denosumab (PROLIA) injection 60 mg

## 2023-11-13 NOTE — Patient Instructions (Signed)
  VISIT SUMMARY: During your visit, we discussed your persistent respiratory symptoms related to bronchiectasis and asthma, as well as your ongoing management for other health conditions including diabetes, kidney disease, hypertension, hyperlipidemia, GERD, and mental health.  YOUR PLAN: BRONCHIECTASIS WITH CHRONIC PRODUCTIVE COUGH AND CHEST CONGESTION: You have been experiencing chronic cough and chest congestion with mucus production and breathing difficulty. -You received a Kenalog  shot today. -You will start a course of prednisone . -Continue using your albuterol  inhaler twice daily. -Continue using your flutter valve for airway clearance. -Continue your Dupixent  treatment. -Add azelastine  nasal spray 1 spray each nostril twice daily and take Flonase  1 spray each nostril twice daily. - Follow up with your lung specialist on November 18.  OTHER ALLERGIC RHINITIS: You have persistent nasal congestion and drainage. -You will start using azelastine  nasal spray. -Continue using Flonase  nasal spray twice daily.  TYPE 2 DIABETES MELLITUS: Your blood sugar levels are well-controlled with a hemoglobin A1c of 6.1. -Check your feet daily for sores.  CHRONIC KIDNEY DISEASE: Your kidney function is well-managed. -Stay hydrated.  HYPERTENSION: Your blood pressure is well-controlled with your current medications. -Continue taking olmesartan  10 mg daily. -Continue taking diltiazem  180 mg daily.  HYPERLIPIDEMIA: Your cholesterol levels are well-controlled with your current medication. -Continue taking rosuvastatin  40 mg daily.  GASTROESOPHAGEAL REFLUX DISEASE: You are managing your GERD with your current medications. -Continue taking famotidine  40 mg daily. -Continue taking pantoprazole  twice daily.  DEPRESSION AND ANXIETY: You are currently managing your depression and anxiety with citalopram . -Continue taking citalopram  10 mg daily. -Start on belsomra 10 mg before bed.                        Contains text generated by [employer].                                 Contains text generated by [employer]bridge.

## 2023-11-13 NOTE — Assessment & Plan Note (Signed)
Orders:    triamcinolone acetonide (KENALOG-40) injection 80 mg

## 2023-11-13 NOTE — Assessment & Plan Note (Signed)
  Orders:   diltiazem  (CARDIZEM  CD) 180 MG 24 hr capsule; Take 1 capsule (180 mg total) by mouth daily.

## 2023-11-14 ENCOUNTER — Ambulatory Visit: Payer: Self-pay | Admitting: Family Medicine

## 2023-11-14 LAB — MICROALBUMIN / CREATININE URINE RATIO
Creatinine, Urine: 26.3 mg/dL
Microalb/Creat Ratio: 253 mg/g{creat} — ABNORMAL HIGH (ref 0–29)
Microalbumin, Urine: 66.6 ug/mL

## 2023-11-14 MED ORDER — KERENDIA 10 MG PO TABS: 10.0000 mg | ORAL_TABLET | Freq: Every day | ORAL | 0 refills | Status: DC

## 2023-11-15 ENCOUNTER — Ambulatory Visit: Payer: Self-pay

## 2023-11-15 DIAGNOSIS — J471 Bronchiectasis with (acute) exacerbation: Secondary | ICD-10-CM | POA: Insufficient documentation

## 2023-11-15 NOTE — Assessment & Plan Note (Signed)
 Cholesterol levels well-controlled with rosuvastatin . - Continue rosuvastatin  40 mg daily.

## 2023-11-15 NOTE — Telephone Encounter (Signed)
 Message sent via [employer]

## 2023-11-15 NOTE — Assessment & Plan Note (Signed)
 Chronic cough and congestion with mucus production and breathing difficulty. Recent chest x-ray clear. Previous treatment with Levaquin  and albuterol  improved symptoms. - Administer Kenalog  shot. - Prescribe prednisone  course. - Continue albuterol  inhaler twice daily. - Use flutter valve. - Continue Dupixent . - Consider azelastine  nasal spray in addition to Flonase .

## 2023-11-15 NOTE — Telephone Encounter (Signed)
 FYI Only or Action Required?: Action required by provider: Request for an alternative medication.  Patient was last seen in primary care on 11/13/2023 by Sherre Clapper, MD.  Called Nurse Triage reporting Medication Problem.  Symptoms began yesterday.  Interventions attempted: Nothing.  Symptoms are: gradually worsening.  Triage Disposition: Call PCP Now  Patient/caregiver understands and will follow disposition?: Yes    Copied from CRM #[id number]. Topic: Clinical - Red Word Triage >> Nov 15, 2023  8:08 AM Amy B wrote: Red Word that prompted transfer to Nurse Triage: hallucinations with new medications Reason for Disposition  [1] Caller has URGENT medicine question about med that primary care doctor (or NP/PA) or specialist prescribed AND [2] triager unable to answer question  Answer Assessment - Initial Assessment Questions Patient requesting a replacement medications due to costs and side effects. States she will stop med until she hears from her PCP. Preferred pharmacy below and requesting a call back regarding her request.   [facility] [phone] - [place], [place] - [address] AT [facility] [address] [facility] [address]  [address], [place] [address]    1. NAME of MEDICINE: What medicine(s) are you calling about?     Kerendia 10 mg, Suvorexant (BELSOMRA) 10 MG TABS 2. QUESTION: What is your question? (e.g., double dose of medicine, side effect)     Side effect  3. PRESCRIBER: Who prescribed the medicine? Reason: if prescribed by specialist, call should be referred to that group.     PCP 4. SYMPTOMS: Do you have any symptoms? If Yes, ask: What symptoms are you having?  How bad are the symptoms (e.g., mild, moderate, severe)     Hallucinations with a pins and needled sensation. She states hallucinations are in her dreams that are scary in nature.  Protocols used: Medication Question Call-A-AH

## 2023-11-15 NOTE — Assessment & Plan Note (Addendum)
 Persistent nasal congestion and drainage. Current treatment includes Flonase . - Prescribe azelastine  nasal spray. - Continue Flonase  nasal spray twice daily. Orders:   azelastine  (ASTELIN ) 0.1 % nasal spray; Place 1 spray into both nostrils 2 (two) times daily. Use in each nostril as directed   predniSONE  (DELTASONE ) 10 MG tablet; Take 6 tablets (60 mg total) by mouth daily with breakfast for 1 day, THEN 5 tablets (50 mg total) daily with breakfast for 1 day, THEN 4 tablets (40 mg total) daily with breakfast for 1 day, THEN 3 tablets (30 mg total) daily with breakfast for 1 day, THEN 2 tablets (20 mg total) daily with breakfast for 1 day, THEN 1 tablet (10 mg total) daily with breakfast for 1 day.   triamcinolone  acetonide (KENALOG -40) injection 80 mg

## 2023-11-15 NOTE — Assessment & Plan Note (Signed)
 Kidney dysfunction well-managed. - Encourage hydration.

## 2023-11-15 NOTE — Assessment & Plan Note (Signed)
 Current treatment includes famotidine  and pantoprazole . - Continue famotidine  40 mg daily. - Continue pantoprazole  twice daily.

## 2023-11-15 NOTE — Assessment & Plan Note (Signed)
 Current treatment includes citalopram . Concerns about sleep medication efficacy. Discussed risks of stronger sleep aids due to respiratory suppression. - Continue citalopram  10 mg daily. - Consider alternative sleep aid if needed.

## 2023-11-16 NOTE — Assessment & Plan Note (Signed)
 Bmi 35, associated comorbidity: diabetes.  Recommend continue to work on eating healthy diet and exercise.'

## 2023-11-17 ENCOUNTER — Other Ambulatory Visit: Payer: Self-pay | Admitting: Family Medicine

## 2023-11-17 DIAGNOSIS — J4540 Moderate persistent asthma, uncomplicated: Secondary | ICD-10-CM

## 2023-12-04 ENCOUNTER — Ambulatory Visit (INDEPENDENT_AMBULATORY_CARE_PROVIDER_SITE_OTHER): Admitting: Pulmonary Disease

## 2023-12-04 ENCOUNTER — Encounter: Payer: Self-pay | Admitting: Pulmonary Disease

## 2023-12-04 VITALS — BP 110/68 | HR 63 | Temp 97.6°F | Ht 62.0 in | Wt 187.4 lb

## 2023-12-04 DIAGNOSIS — Z23 Encounter for immunization: Secondary | ICD-10-CM | POA: Diagnosis not present

## 2023-12-04 DIAGNOSIS — J4551 Severe persistent asthma with (acute) exacerbation: Secondary | ICD-10-CM | POA: Diagnosis not present

## 2023-12-04 MED ORDER — PREDNISONE 10 MG PO TABS: ORAL_TABLET | ORAL | 0 refills | Status: AC

## 2023-12-04 NOTE — Progress Notes (Signed)
 @Patient  ID: Diana Hendrix, female    DOB: 21-Aug-1945, 78 y.o.   MRN: [id number]  Chief Complaint  Patient presents with   Asthma   Obstructive Sleep Apnea    Follow up    Referring provider: Sherre Clapper, MD  HPI:   78 y.o. woman whom we are seeing for evaluation of congestion cough wheeze dyspnea felt to be related to asthma.  Multiple PCP notes reviewed.  Returns for follow-up.  Adherence to Stiolto and Dupixent .  Overall Dupixent  has improved symptoms.  Unfortunately she inhaled some cleaning chemicals including bleach a couple months ago.  Cough and sinus congestion much worse.  Some chest congestion as well.  Improved with a couple rounds of antibiotics and steroids.  Overall much better but still with some persistent congestion and cough.  Worse than prior baseline but again have improved with Stiolto and Dupixent .  We discussed additional course of steroids.  She is clear on exam.  HPI initial visit: Diagnosed with pneumonia 04/2022.  Based on chest x-ray.  I cannot review results.  Treated with antibiotics.  Since then has had worsening chest congestion, cough, chest tightness, dyspnea on exertion.  Waxes and wanes.  Comes and goes.  Severity is not always reliably reproducible.  Repeat chest x-ray 09/2022 reviewed, clear lungs.  CTA PE protocol 09/2022 for ongoing workup reviewed, no PE, diffuse mosaicism, no other infiltrate etc.  She had a lot of back pain and this did reveal back fractures.  This has been fixed, kyphoplasty in the interim.  Some residual pain but getting better.  Questionaires / Pulmonary Flowsheets:   ACT:  Asthma Control Test ACT Total Score  02/17/2022 11:01 AM 19    MMRC:     No data to display          Epworth:      No data to display          Tests:   FENO:  No results found for: NITRICOXIDE  PFT:    Latest Ref Rng & Units 01/29/2023    3:40 PM 04/25/2022   11:17 AM 08/07/2019    8:48 AM  PFT Results  FVC-Pre L 2.10  2.35   2.54   FVC-Predicted Pre % 82  91  93   FVC-Post L 2.32  2.24  2.60   FVC-Predicted Post % 91  87  96   Pre FEV1/FVC % % 85  82  84   Post FEV1/FCV % % 87  87  85   FEV1-Pre L 1.78  1.92  2.13   FEV1-Predicted Pre % 94  99  104   FEV1-Post L 2.02  1.95  2.20   DLCO uncorrected ml/min/mmHg 15.45  16.00  16.10   DLCO UNC% % 86  89  88   DLCO corrected ml/min/mmHg 15.04  16.00  16.10   DLCO COR %Predicted % 84  89  88   DLVA Predicted % 97  99  93   TLC L 4.04  4.40  4.34   TLC % Predicted % 84  92  89   RV % Predicted % 81  92  78   Personally reviewed and interpreted as 01/2023 significant bronchodilator response, otherwise normal spirometry, lung volumes DLCO within normal limits, largely unchanged over time with no findings of significant micro dilator response on review of prior PFTs  WALK:      No data to display          Imaging: Personally  reviewed and as per EMR and discussion of this note DG Chest 2 View Result Date: 11/06/2023 CLINICAL DATA:  Chronic cough for 2-3 weeks, productive with shortness of breath EXAM: CHEST - 2 VIEW COMPARISON:  04/18/2023 FINDINGS: Mild cardiomegaly. Both lungs are clear. Vertebral cement augmentation of a midthoracic vertebra. IMPRESSION: Mild cardiomegaly without acute abnormality of the lungs. Electronically Signed   By: Marolyn JONETTA Jaksch M.D.   On: 11/06/2023 18:46     Lab Results: Personally reviewed CBC    Component Value Date/Time   WBC 6.8 11/09/2023 0911   WBC 11.0 (H) 11/11/2021 0101   RBC 4.73 11/09/2023 0911   RBC 3.92 11/11/2021 0101   HGB 14.1 11/09/2023 0911   HCT 42.9 11/09/2023 0911   PLT 253 11/09/2023 0911   MCV 91 11/09/2023 0911   MCH 29.8 11/09/2023 0911   MCH 30.1 11/11/2021 0101   MCHC 32.9 11/09/2023 0911   MCHC 34.1 11/11/2021 0101   RDW 13.4 11/09/2023 0911   LYMPHSABS 2.2 11/09/2023 0911   EOSABS 0.2 11/09/2023 0911   BASOSABS 0.1 11/09/2023 0911    BMET    Component Value Date/Time   NA 135  11/09/2023 0911   K 4.4 11/09/2023 0911   CL 101 11/09/2023 0911   CO2 20 11/09/2023 0911   GLUCOSE 102 (H) 11/09/2023 0911   GLUCOSE 121 (H) 11/13/2021 0043   BUN 13 11/09/2023 0911   CREATININE 1.23 (H) 11/09/2023 0911   CALCIUM  9.4 11/09/2023 0911   GFRNONAA 46 (L) 11/13/2021 0043   GFRAA 65 12/23/2019 1123    BNP    Component Value Date/Time   BNP 32.2 11/09/2021 1250    ProBNP    Component Value Date/Time   PROBNP 90 07/20/2020 1550   PROBNP 26.0 04/24/2019 0913    Specialty Problems       Pulmonary Problems   OSA (obstructive sleep apnea)          Allergic rhinitis due to allergen   Allergic asthma   Mild intermittent asthma   Consolidation of left lower lobe of lung   Asthma in adult, mild intermittent, with acute exacerbation   Upper respiratory tract infection   Chest congestion   Chronic cough   Severe persistent asthma without complication (HCC)   Globus sensation   Acute sinusitis   Bronchiectasis with acute exacerbation (HCC)    Allergies  Allergen Reactions   Glucosamine Other (See Comments)   Zyrtec [Cetirizine]     Weird dreams.    Ibuprofen Other (See Comments)    Pt states Dr doesn't wont her to take it; not allergic, no NSAIDS    Immunization History  Administered Date(s) Administered   Fluad Quad(high Dose 65+) 10/16/2018, 11/10/2019, 09/27/2020, 10/24/2021   Fluad Trivalent(High Dose 65+) 12/04/2022   INFLUENZA, HIGH DOSE SEASONAL PF 10/09/2016, 10/04/2017, 12/04/2023   Influenza,inj,Quad PF,6+ Mos 10/25/2012, 09/17/2013, 09/21/2015   Influenza-Unspecified 12/12/2014   Moderna Covid-19 Vaccine Bivalent Booster 62yrs & up 01/04/2021   Moderna SARS-COV2 Booster Vaccination 06/21/2020   Moderna Sars-Covid-2 Vaccination 02/28/2019, 03/28/2019, 11/14/2019   Pfizer(Comirnaty)Fall Seasonal Vaccine 12 years and older 11/08/2021, 12/20/2022   Pneumococcal Conjugate-13 03/18/2015, 11/18/2021   Pneumococcal Polysaccharide-23 02/09/2011,  09/17/2013   Tdap 03/18/2015   Zoster Recombinant(Shingrix) 02/19/2021   Zoster, Live 09/17/2011    Past Medical History:  Diagnosis Date   Asthma    Bronchiectasis (HCC)    Chronic right flank pain 12/04/2022   Diabetes mellitus without complication (HCC)    Diastolic heart failure (  HCC)    Echo EF 60/65% Heart monitor was normal   GERD (gastroesophageal reflux disease)    History of colon polyps    Hyperlipidemia    Hypertension    Macular edema, cystoid    OSA (obstructive sleep apnea) 09/17/2012   CPAP   Osteopenia    Osteoporosis    Plantar fasciitis 09/09/2019   Pneumonia    Sleep apnea    Syncope, vasovagal 04/16/2017    Tobacco History: Social History   Tobacco Use  Smoking Status Never   Passive exposure: Past  Smokeless Tobacco Never   Counseling given: Not Answered   Continue to not smoke  Outpatient Encounter Medications as of 12/04/2023  Medication Sig   albuterol  (VENTOLIN  HFA) 108 (90 Base) MCG/ACT inhaler Inhale 2 puffs into the lungs every 6 (six) hours as needed for wheezing or shortness of breath.   Cholecalciferol (VITAMIN D3) 50 MCG (2000 UT) TABS Take 1 tablet by mouth daily.   citalopram  (CELEXA ) 10 MG tablet TAKE 1 TABLET(10 MG) BY MOUTH DAILY   diltiazem  (CARDIZEM  CD) 180 MG 24 hr capsule Take 1 capsule (180 mg total) by mouth daily.   Dupilumab  (DUPIXENT ) 300 MG/2ML SOAJ Inject 300 mg into the skin every 14 (fourteen) days.   famotidine  (PEPCID ) 40 MG tablet Take 1 tablet (40 mg total) by mouth daily.   Finerenone (KERENDIA) 10 MG TABS Take 1 tablet (10 mg total) by mouth daily.   fluticasone  (FLONASE ) 50 MCG/ACT nasal spray SHAKE LIQUID AND USE 2 SPRAYS IN EACH NOSTRIL DAILY   furosemide  (LASIX ) 20 MG tablet Take 1 tablet (20 mg total) by mouth daily. Patient needs to make an appointment for further refills. 1st attempt.   montelukast  (SINGULAIR ) 10 MG tablet Take 1 tablet (10 mg total) by mouth at bedtime.   NON FORMULARY CPAP at  bedtime   olmesartan  (BENICAR ) 20 MG tablet Take 10 mg by mouth daily.   omega-3 acid ethyl esters (LOVAZA ) 1 g capsule TAKE 2 CAPSULES BY MOUTH TWICE DAILY   pantoprazole  (PROTONIX ) 40 MG tablet Take 1 tablet (40 mg total) by mouth 2 (two) times daily.   Polyethyl Glycol-Propyl Glycol (SYSTANE OP) Place 1 drop into both eyes 4 (four) times daily.   polyethylene glycol (MIRALAX  / GLYCOLAX ) 17 g packet Take 17 g by mouth daily.   predniSONE  (DELTASONE ) 10 MG tablet Take 2 tablets (20 mg total) by mouth daily with breakfast for 5 days, THEN 1 tablet (10 mg total) daily with breakfast for 5 days.   rosuvastatin  (CRESTOR ) 40 MG tablet TAKE 1 TABLET(40 MG) BY MOUTH DAILY   senna (SENOKOT) 8.6 MG tablet Take 1 tablet by mouth daily.   Tiotropium Bromide-Olodaterol (STIOLTO RESPIMAT ) 2.5-2.5 MCG/ACT AERS Inhale 2 puffs into the lungs daily.   [DISCONTINUED] azelastine  (ASTELIN ) 0.1 % nasal spray Place 1 spray into both nostrils 2 (two) times daily. Use in each nostril as directed   [DISCONTINUED] benzonatate  (TESSALON ) 200 MG capsule Take 1 capsule (200 mg total) by mouth 3 (three) times daily as needed for cough.   [DISCONTINUED] Suvorexant (BELSOMRA) 10 MG TABS Take 1 tablet (10 mg total) by mouth at bedtime as needed.   Facility-Administered Encounter Medications as of 12/04/2023  Medication   [START ON 05/11/2024] denosumab  (PROLIA ) injection 60 mg     Review of Systems  Review of Systems  N/a  Physical Exam  BP 110/68   Pulse 63   Temp 97.6 F (36.4 C) (Oral)   Ht 5'  2 (1.575 m)   Wt 187 lb 6.4 oz (85 kg)   SpO2 98%   BMI 34.28 kg/m   Wt Readings from Last 5 Encounters:  12/04/23 187 lb 6.4 oz (85 kg)  11/13/23 188 lb (85.3 kg)  11/05/23 191 lb (86.6 kg)  10/26/23 189 lb (85.7 kg)  10/19/23 191 lb 4 oz (86.8 kg)    BMI Readings from Last 5 Encounters:  12/04/23 34.28 kg/m  11/13/23 35.52 kg/m  11/05/23 36.09 kg/m  10/26/23 35.71 kg/m  10/19/23 36.14 kg/m      Physical Exam General: Sitting up, no distress eyes: No icterus Neck: No JVD Pulmonary: Clear, normal work of breathing good air excursion Cardiovascular: Warm, no edema   Assessment & Plan:   Severe persistent asthma/chronic bronchitis with subacute exacerbation: Symptoms of chest tightness, wheeze, productive cough.  Improved with Stiolto and Dupixent .  On going symptoms exacerbation after inhaling cleaning solution some weeks ago.  Markedly improved.  Additional steroids, lower dose to ease symptoms and aid in return to baseline.  GERD: Likely contributing somewhat to cough and edema in the throat.  Continue GI evaluation.,  Upcoming endoscopy.  Healthcare maintenance: High-dose flu shot administered today.   Return in about 4 weeks (around 01/01/2024) for f/u Dr. Annella.   Donnice JONELLE Annella, MD 12/04/2023

## 2023-12-04 NOTE — Patient Instructions (Signed)
 It is nice to see you again  I am sorry you have been feeling unwell for the last couple months  I am glad you are doing a bit better  Additional prednisone  today  Flu shot today  Return to clinic in 1 month or sooner as needed with Dr. Annella

## 2023-12-06 ENCOUNTER — Ambulatory Visit: Admitting: Gastroenterology

## 2023-12-06 ENCOUNTER — Other Ambulatory Visit: Payer: Self-pay | Admitting: *Deleted

## 2023-12-06 ENCOUNTER — Encounter: Payer: Self-pay | Admitting: Gastroenterology

## 2023-12-06 VITALS — BP 146/75 | HR 76 | Temp 97.3°F | Resp 15 | Ht 61.0 in | Wt 191.0 lb

## 2023-12-06 DIAGNOSIS — K219 Gastro-esophageal reflux disease without esophagitis: Secondary | ICD-10-CM | POA: Diagnosis not present

## 2023-12-06 DIAGNOSIS — Q399 Congenital malformation of esophagus, unspecified: Secondary | ICD-10-CM

## 2023-12-06 DIAGNOSIS — K317 Polyp of stomach and duodenum: Secondary | ICD-10-CM

## 2023-12-06 DIAGNOSIS — K449 Diaphragmatic hernia without obstruction or gangrene: Secondary | ICD-10-CM | POA: Diagnosis not present

## 2023-12-06 DIAGNOSIS — E785 Hyperlipidemia, unspecified: Secondary | ICD-10-CM | POA: Diagnosis not present

## 2023-12-06 DIAGNOSIS — K2950 Unspecified chronic gastritis without bleeding: Secondary | ICD-10-CM

## 2023-12-06 DIAGNOSIS — I10 Essential (primary) hypertension: Secondary | ICD-10-CM | POA: Diagnosis not present

## 2023-12-06 DIAGNOSIS — G4733 Obstructive sleep apnea (adult) (pediatric): Secondary | ICD-10-CM | POA: Diagnosis not present

## 2023-12-06 MED ORDER — PANTOPRAZOLE SODIUM 40 MG PO TBEC: 40.0000 mg | DELAYED_RELEASE_TABLET | Freq: Every day | ORAL | Status: AC

## 2023-12-06 MED ORDER — SODIUM CHLORIDE 0.9 % IV SOLN: 500.0000 mL | Freq: Once | INTRAVENOUS | Status: DC

## 2023-12-06 NOTE — Progress Notes (Signed)
 1005  Pt experienced laryngeal spasm with jaw thrust  performed. Nasopharyngeal airway size 7.0  placed without trauma.

## 2023-12-06 NOTE — Progress Notes (Signed)
 Report given to PACU, vss

## 2023-12-06 NOTE — Patient Instructions (Addendum)

## 2023-12-06 NOTE — Progress Notes (Signed)
0959 Robinul 0.1 mg IV given due large amount of secretions upon assessment.  MD made aware, vss 

## 2023-12-06 NOTE — Progress Notes (Signed)
 Chief Complaint:GERD Primary GI Doctor:Dr. Charlanne   HPI:  Patient is a  78  year old female patient with past medical history of DM, GERD, hypertension, who was referred to me by Sherre Clapper, MD on 08/22/23 for a evaluation of GERD.   Patient last seen in GI office by Dr. Charlanne on 05/13/19 for chronic constipation.   09/20/23 seen by pulmonary for follow-up. CT scan interim reviewed which shows right-sided mild basilar bronchiectasis. Started on dupixent . Per note: GERD likely contributing somewhat to cough and edema in the throat. Continue GI evaluation.    Interval History    Patient presents for evaluation of GERD and currently taking Pantoprazole  40mg  po daily and Pepcid  40mg  in the evening.    She reports her current issues all started  two years ago she had vaso vagal episode and passed out and fractured her T7-T8.  Patient reports she had two separate falls due to vaso vagal episodes. She has had issues with recurrent pneumonia since the event.  Patient reports she initially developed a dry cough and felt a large lump in the back of her throat.  Patient was evaluated by pulmonology as well as started on omeprazole .  Patient reports the symptoms did not get better therefore she was evaluated in urgent care where they switched her from omeprazole  to pantoprazole .  Patient was then evaluated by her PCP and having issues with dry cough and hoarseness.  Patient was started on Pepcid  in the evening.  Patient states she was also started on Dupixent  by her pulmonologist for her asthma.  Patient reports with all the recent changes she has felt somewhat better but continues with current issues.  When she last saw her pulmonologist about a month ago it was mentioned that she should come see Dr. Charlanne.   Patient also has history of chronic constipation and currently taking OTC miralax  po daily titrate to effect and stool softeners as needed.  She has tried Linzess  along with other prescription medications  for constipation in past, but states it caused severe nausea leading to vaso vagal episodes and falls. She enquires today if ok for her to take them daily. Denies abdominal pain or blood in stool.     Past GI procedures: Colonoscopy 02/2019: Mild diverticulosis.  Otherwise normal.  10/2013: ([facility]) Fair prep, 6 mm polyp SP polypectomy, mild sigmoid diverticulosis.  Biopsies tubular adenoma. No need to repeat unless new problems.       Wt Readings from Last 3 Encounters:  10/19/23 191 lb 4 oz (86.8 kg)  09/20/23 189 lb 9.6 oz (86 kg)  09/19/23 190 lb (86.2 kg)        Past Medical History:  Diagnosis Date   Asthma     Bronchiectasis (HCC)     Chronic right flank pain 12/04/2022   Diabetes mellitus without complication (HCC)     Diastolic heart failure (HCC)      Echo EF 60/65% Heart monitor was normal   GERD (gastroesophageal reflux disease)     History of colon polyps     Hyperlipidemia     Hypertension     Macular edema, cystoid     OSA (obstructive sleep apnea) 09/17/2012    CPAP   Osteopenia     Osteoporosis     Plantar fasciitis 09/09/2019   Pneumonia     Sleep apnea     Syncope, vasovagal 04/16/2017               Past  Surgical History:  Procedure Laterality Date   CESAREAN SECTION       COLONOSCOPY   10/29/2013    Colonic polyp status post polypectomy. Mild sigmoid diverticulosis. Small internal hemorrhoids   FOOT NEUROMA SURGERY Bilateral      s/p surgery in both feet   MENISCUS REPAIR Left 09/13/2016   MENISCUS REPAIR Right 2019   SHOULDER ARTHROSCOPY Left      arthroscopy.    TUBAL LIGATION                    Current Outpatient Medications  Medication Sig Dispense Refill   albuterol  (VENTOLIN  HFA) 108 (90 Base) MCG/ACT inhaler Inhale 2 puffs into the lungs every 6 (six) hours as needed for wheezing or shortness of breath. 16 g 3   Cholecalciferol (VITAMIN D3) 50 MCG (2000 UT) TABS Take 1 tablet by mouth daily.       citalopram  (CELEXA ) 10 MG tablet TAKE  1 TABLET(10 MG) BY MOUTH DAILY 90 tablet 1   diltiazem  (CARDIZEM  CD) 180 MG 24 hr capsule Take 1 capsule (180 mg total) by mouth daily. 90 capsule 0   Dupilumab  (DUPIXENT ) 300 MG/2ML SOAJ Inject 300 mg into the skin every 14 (fourteen) days. 12 mL 1   famotidine  (PEPCID ) 40 MG tablet Take 1 tablet (40 mg total) by mouth daily. 90 tablet 1   fluticasone  (FLONASE ) 50 MCG/ACT nasal spray Place 2 sprays into both nostrils daily. 16 g 6   furosemide  (LASIX ) 20 MG tablet Take 1 tablet (20 mg total) by mouth daily. Patient needs to make an appointment for further refills. 1st attempt. 90 tablet 3   montelukast  (SINGULAIR ) 10 MG tablet Take 1 tablet (10 mg total) by mouth at bedtime. 90 tablet 3   NON FORMULARY CPAP at bedtime       olmesartan  (BENICAR ) 20 MG tablet Take 10 mg by mouth daily.       omega-3 acid ethyl esters (LOVAZA ) 1 g capsule TAKE 2 CAPSULES BY MOUTH TWICE DAILY 360 capsule 2   Polyethyl Glycol-Propyl Glycol (SYSTANE OP) Place 1 drop into both eyes 4 (four) times daily.       polyethylene glycol (MIRALAX  / GLYCOLAX ) 17 g packet Take 17 g by mouth daily.       rosuvastatin  (CRESTOR ) 40 MG tablet TAKE 1 TABLET(40 MG) BY MOUTH DAILY 90 tablet 1   Tiotropium Bromide-Olodaterol (STIOLTO RESPIMAT ) 2.5-2.5 MCG/ACT AERS Inhale 2 puffs into the lungs daily. 1 each 11   zolpidem  (AMBIEN ) 5 MG tablet TAKE 1 TABLET(5 MG) BY MOUTH AT BEDTIME AS NEEDED FOR SLEEP 30 tablet 0   pantoprazole  (PROTONIX ) 40 MG tablet Take 1 tablet (40 mg total) by mouth 2 (two) times daily. 90 tablet 3               Current Facility-Administered Medications  Medication Dose Route Frequency Provider Last Rate Last Admin   [START ON 11/09/2023] denosumab  (PROLIA ) injection 60 mg  60 mg Subcutaneous Once Sherre Clapper, MD                 Allergies as of 10/19/2023 - Review Complete 10/19/2023  Allergen Reaction Noted   Cortisone Other (See Comments) 04/26/2017   Glucosamine Other (See Comments) 04/16/2017   Zyrtec  [cetirizine]   01/04/2021   Ibuprofen Other (See Comments) 08/14/2019           Family History  Problem Relation Age of Onset   Hyperlipidemia Mother  Heart disease Mother     Hypertension Mother     Atrial fibrillation Mother     CAD Mother     Congestive Heart Failure Mother     CVA Father     Hypertension Brother     Heart disease Maternal Grandmother     Heart disease Maternal Grandfather     Hypertension Son     Thyroid  disease Daughter     Colon cancer Neg Hx     Esophageal cancer Neg Hx            Review of Systems:    Constitutional: No weight loss, fever, chills, weakness or fatigue HEENT: Eyes: No change in vision               Ears, Nose, Throat:  No change in hearing or congestion Skin: No rash or itching Cardiovascular: No chest pain, chest pressure or palpitations   Respiratory: No SOB or cough Gastrointestinal: See HPI and otherwise negative Genitourinary: No dysuria or change in urinary frequency Neurological: No headache, dizziness or syncope Musculoskeletal: No new muscle or joint pain Hematologic: No bleeding or bruising Psychiatric: No history of depression or anxiety      Physical Exam:  Vital signs: BP 112/60 (BP Location: Left Arm, Patient Position: Sitting, Cuff Size: Normal)   Pulse 72   Ht 5' 1 (1.549 m) Comment: height measured without shoes  Wt 191 lb 4 oz (86.8 kg)   BMI 36.14 kg/m    Constitutional:   Pleasant female appears to be in NAD, Well developed, Well nourished, alert and cooperative Throat: Oral cavity and pharynx without inflammation, swelling or lesion.  Respiratory: Respirations even and unlabored. Lungs clear to auscultation bilaterally.   No wheezes, crackles, or rhonchi.  Cardiovascular: Normal S1, S2. Regular rate and rhythm. No peripheral edema, cyanosis or pallor.  Gastrointestinal:  Soft, nondistended, nontender. No rebound or guarding. Normal bowel sounds. No appreciable masses or hepatomegaly. Rectal:  Not  performed.  Msk:  Symmetrical without gross deformities. Without edema, no deformity or joint abnormality.  Neurologic:  Alert and  oriented x4;  grossly normal neurologically.  Skin:   Dry and intact without significant lesions or rashes.   RELEVANT LABS AND IMAGING: CBC     Latest Ref Rng & Units 08/02/2023    8:16 AM 04/05/2023   10:31 AM 12/20/2022   10:21 AM  CBC  WBC 3.4 - 10.8 x10E3/uL 6.5  6.8  7.8   Hemoglobin 11.1 - 15.9 g/dL 86.1  86.1  85.6   Hematocrit 34.0 - 46.6 % 42.2  42.7  43.5   Platelets 150 - 450 x10E3/uL 226  211  243       CMP         Latest Ref Rng & Units 08/02/2023    8:16 AM 04/05/2023   10:31 AM 12/20/2022   10:21 AM  CMP  Glucose 70 - 99 mg/dL 897  97  894   BUN 8 - 27 mg/dL 16  15  13    Creatinine 0.57 - 1.00 mg/dL 8.67  8.85  8.96   Sodium 134 - 144 mmol/L 138  137  141   Potassium 3.5 - 5.2 mmol/L 4.5  4.4  4.6   Chloride 96 - 106 mmol/L 101  101  102   CO2 20 - 29 mmol/L 21  22  22    Calcium  8.7 - 10.3 mg/dL 9.3  9.3  9.3   Total Protein 6.0 - 8.5 g/dL 7.1  7.0  7.1   Total Bilirubin 0.0 - 1.2 mg/dL 0.5  0.6  0.5   Alkaline Phos 44 - 121 IU/L 62  63  92   AST 0 - 40 IU/L 25  27  30    ALT 0 - 32 IU/L 18  20  18        Recent Labs       Lab Results  Component Value Date    TSH 1.810 02/09/2021     10/23 echo- Left ventricular ejection fraction, by estimation, is 60 to 65%.    Assessment:     Encounter Diagnoses  Name Primary?   Globus sensation Yes   Gastroesophageal reflux disease without esophagitis     Dry cough     Hoarseness     Chronic idiopathic constipation       78 year old female patient that presents with persistent dry cough, hoarseness and globus sensation thought to be related to possible GERD.  He has been evaluated and treated by a pulmonologist for asthma. They recommended she come see us  for evaluation of possible GERD.  Patient currently on pantoprazole  40 mg in the morning and Pepcid  40 mg in the evening with some  improvement.  We discussed increasing her current medication regimen versus endoscopic procedures she would like to proceed with both.  Will go ahead and increase the pantoprazole  to twice daily with Pepcid  at bedtime.  If the trial of medication does not help and the endoscopy is negative we will consider impedance study with esophageal manometry to evaluate level of acidification.     Patient also has history of chronic constipation that is well-managed with over-the-counter MiraLAX  and stool softeners.  Patient will titrate as needed.  Patient does not tolerate pro secretory agents.   Plan: - Continue OTC miralax  po daily, titrate as needed -Continue stool softeners prn -Increase pantoprazole  40 mg to twice daily -Continue Pepcid  40 mg, take at bedtime -Schedule EGD with possible dilatation in [facility] with Dr. Charlanne. The risks and benefits of EGD with possible biopsies and esophageal dilation were discussed with the patient who agrees to proceed. -if negative exam may consider impedence study/ eso manometry   Thank you for the courtesy of this consult. Please call me with any questions or concerns.    Deanna May, FNP-C [facility] 10/19/2023, 11:33 AM    Attending physician's note   I have taken history, reviewed the chart and examined the patient. I performed a substantive portion of this encounter, including complete performance of at least one of the key components, in conjunction with the APP. I agree with the Advanced Practitioner's note, impression and recommendations.   For EGD today   Anselm Charlanne, MD [facility] [phone]

## 2023-12-06 NOTE — Op Note (Signed)
 [facility] Patient Name: Diana Hendrix Procedure Date: 12/06/2023 9:51 AM MRN: [id number] Endoscopist: Lynnie Bring , MD, [id number] Age: 78 Referring MD:  Date of Birth: [birth year] Gender: Female Account #: [id number] Procedure:                Upper GI endoscopy Indications:              GERD with globus sensation Medicines:                Monitored Anesthesia Care Procedure:                Pre-Anesthesia Assessment:                           - Prior to the procedure, a History and Physical                            was performed, and patient medications and                            allergies were reviewed. The patient's tolerance of                            previous anesthesia was also reviewed. The risks                            and benefits of the procedure and the sedation                            options and risks were discussed with the patient.                            All questions were answered, and informed consent                            was obtained. Prior Anticoagulants: The patient has                            taken no anticoagulant or antiplatelet agents. ASA                            Grade Assessment: II - A patient with mild systemic                            disease. After reviewing the risks and benefits,                            the patient was deemed in satisfactory condition to                            undergo the procedure.                           After obtaining informed consent, the endoscope was  passed under direct vision. Throughout the                            procedure, the patient's blood pressure, pulse, and                            oxygen  saturations were monitored continuously. The                            Olympus scope [id number] was introduced through the                            mouth, and advanced to the second part of duodenum.                            The upper GI endoscopy  was accomplished without                            difficulty. The patient tolerated the procedure                            well. Scope In: Scope Out: Findings:                 The examined esophagus was moderately tortuous.                            Biopsies were obtained from the proximal and distal                            esophagus with cold forceps for histology to r/o                            eosinophilic esophagitis.                           The Z-line was regular and was found 35 cm from the                            incisors.                           A small hiatal hernia was present.                           Localized mild inflammation characterized by                            erythema was found in the gastric antrum. Biopsies                            were taken with a cold forceps for histology.                           Multiple (20-25) 4 to 10 mm sessile polyps with no  bleeding and no stigmata of recent bleeding were                            found in the gastric fundus and in the gastric                            body. Three polyps were removed with a cold snare.                            Resection and retrieval were complete.                           The examined duodenum was normal. Complications:            No immediate complications. Estimated Blood Loss:     Estimated blood loss: none. Impression:               - Small hiatal hernia.                           - Gastritis. Biopsied.                           - Multiple gastric polyps. Resected and retrieved x                            3. Recommendation:           - Patient has a contact number available for                            emergencies. The signs and symptoms of potential                            delayed complications were discussed with the                            patient. Return to normal activities tomorrow.                            Written discharge  instructions were provided to the                            patient.                           - Resume previous diet.                           - Continue present medications for now. Once                            better, can try Protonix  every other day.                           - If still with globus sensation, would recommend  ENT consultation.                           - Await pathology results.                           - The findings and recommendations were discussed                            with the patient's family. Lynnie Bring, MD 12/06/2023 10:22:24 AM This report has been signed electronically.

## 2023-12-06 NOTE — Progress Notes (Signed)
 I have reviewed the patient's medical history in detail and updated the computerized patient record.

## 2023-12-06 NOTE — Progress Notes (Signed)
 0915 Simethicone  133 mg per 2 cc given with 3 cc of H20 per Dr Charlanne request.

## 2023-12-07 ENCOUNTER — Telehealth: Payer: Self-pay

## 2023-12-07 NOTE — Telephone Encounter (Signed)
  Follow up Call-     12/06/2023    9:08 AM  Call back number  Post procedure Call Back phone  # [phone]  Permission to leave phone message Yes     Patient questions:  Do you have a fever, pain , or abdominal swelling? No. Pain Score  0 *  Have you tolerated food without any problems? Yes.    Have you been able to return to your normal activities? Yes.    Do you have any questions about your discharge instructions: Diet   No. Medications  No. Follow up visit  No.  Do you have questions or concerns about your Care? No.  Actions: * If pain score is 4 or above: No action needed, pain <4.

## 2023-12-10 LAB — SURGICAL PATHOLOGY

## 2023-12-12 ENCOUNTER — Ambulatory Visit: Payer: Self-pay | Admitting: Gastroenterology

## 2023-12-17 ENCOUNTER — Other Ambulatory Visit

## 2023-12-20 ENCOUNTER — Ambulatory Visit: Admitting: Pulmonary Disease

## 2023-12-28 ENCOUNTER — Other Ambulatory Visit: Payer: Self-pay | Admitting: Family Medicine

## 2024-01-01 ENCOUNTER — Encounter: Payer: Self-pay | Admitting: Pulmonary Disease

## 2024-01-01 ENCOUNTER — Ambulatory Visit: Admitting: Pulmonary Disease

## 2024-01-01 VITALS — BP 105/73 | HR 72 | Temp 97.6°F | Ht 62.0 in | Wt 188.4 lb

## 2024-01-01 DIAGNOSIS — J42 Unspecified chronic bronchitis: Secondary | ICD-10-CM | POA: Diagnosis not present

## 2024-01-01 DIAGNOSIS — B379 Candidiasis, unspecified: Secondary | ICD-10-CM | POA: Diagnosis not present

## 2024-01-01 DIAGNOSIS — R0789 Other chest pain: Secondary | ICD-10-CM

## 2024-01-01 DIAGNOSIS — K219 Gastro-esophageal reflux disease without esophagitis: Secondary | ICD-10-CM | POA: Diagnosis not present

## 2024-01-01 DIAGNOSIS — J4550 Severe persistent asthma, uncomplicated: Secondary | ICD-10-CM | POA: Diagnosis not present

## 2024-01-01 MED ORDER — FLUCONAZOLE 100 MG PO TABS: ORAL_TABLET | ORAL | 0 refills | Status: AC

## 2024-01-01 NOTE — Patient Instructions (Addendum)
 Take fluconazole  for thrush  No changes in medications otherwise  Referral to heart doctor with chest discomfort  Return to clinic in 6 months or sooner as needed

## 2024-01-01 NOTE — Progress Notes (Signed)
 @Patient  ID: Diana Hendrix, female    DOB: August 24, 1945, 78 y.o.   MRN: [id number]  Chief Complaint  Patient presents with   Asthma    Pt states she is doing better since Nov OV    Referring provider: Sherre Clapper, MD  HPI:   78 y.o. woman whom we are seeing for evaluation of congestion cough wheeze dyspnea felt to be related to asthma.  Multiple PCP notes reviewed.  Returns for follow-up.  Improved from last visit.  Took a couple rounds of steroid and antibiotics.  But things are better.  Still with persistent cough but back to baseline.  EGD revealed esophagitis and gastritis.  Small hiatal hernia.  Ongoing GERD symptoms likely driving cough.  We discussed in detail.  HPI initial visit: Diagnosed with pneumonia 04/2022.  Based on chest x-ray.  I cannot review results.  Treated with antibiotics.  Since then has had worsening chest congestion, cough, chest tightness, dyspnea on exertion.  Waxes and wanes.  Comes and goes.  Severity is not always reliably reproducible.  Repeat chest x-ray 09/2022 reviewed, clear lungs.  CTA PE protocol 09/2022 for ongoing workup reviewed, no PE, diffuse mosaicism, no other infiltrate etc.  She had a lot of back pain and this did reveal back fractures.  This has been fixed, kyphoplasty in the interim.  Some residual pain but getting better.  Questionaires / Pulmonary Flowsheets:   ACT:  Asthma Control Test ACT Total Score  01/01/2024  1:15 PM 12  02/17/2022 11:01 AM 19    MMRC:     No data to display          Epworth:      No data to display          Tests:   FENO:  No results found for: NITRICOXIDE  PFT:    Latest Ref Rng & Units 01/29/2023    3:40 PM 04/25/2022   11:17 AM 08/07/2019    8:48 AM  PFT Results  FVC-Pre L 2.10  2.35  2.54   FVC-Predicted Pre % 82  91  93   FVC-Post L 2.32  2.24  2.60   FVC-Predicted Post % 91  87  96   Pre FEV1/FVC % % 85  82  84   Post FEV1/FCV % % 87  87  85   FEV1-Pre L 1.78  1.92  2.13    FEV1-Predicted Pre % 94  99  104   FEV1-Post L 2.02  1.95  2.20   DLCO uncorrected ml/min/mmHg 15.45  16.00  16.10   DLCO UNC% % 86  89  88   DLCO corrected ml/min/mmHg 15.04  16.00  16.10   DLCO COR %Predicted % 84  89  88   DLVA Predicted % 97  99  93   TLC L 4.04  4.40  4.34   TLC % Predicted % 84  92  89   RV % Predicted % 81  92  78   Personally reviewed and interpreted as 01/2023 significant bronchodilator response, otherwise normal spirometry, lung volumes DLCO within normal limits, largely unchanged over time with no findings of significant micro dilator response on review of prior PFTs  WALK:      No data to display          Imaging: Personally reviewed and as per EMR and discussion of this note No results found.    Lab Results: Personally reviewed CBC    Component Value Date/Time  WBC 6.8 11/09/2023 0911   WBC 11.0 (H) 11/11/2021 0101   RBC 4.73 11/09/2023 0911   RBC 3.92 11/11/2021 0101   HGB 14.1 11/09/2023 0911   HCT 42.9 11/09/2023 0911   PLT 253 11/09/2023 0911   MCV 91 11/09/2023 0911   MCH 29.8 11/09/2023 0911   MCH 30.1 11/11/2021 0101   MCHC 32.9 11/09/2023 0911   MCHC 34.1 11/11/2021 0101   RDW 13.4 11/09/2023 0911   LYMPHSABS 2.2 11/09/2023 0911   EOSABS 0.2 11/09/2023 0911   BASOSABS 0.1 11/09/2023 0911    BMET    Component Value Date/Time   NA 135 11/09/2023 0911   K 4.4 11/09/2023 0911   CL 101 11/09/2023 0911   CO2 20 11/09/2023 0911   GLUCOSE 102 (H) 11/09/2023 0911   GLUCOSE 121 (H) 11/13/2021 0043   BUN 13 11/09/2023 0911   CREATININE 1.23 (H) 11/09/2023 0911   CALCIUM  9.4 11/09/2023 0911   GFRNONAA 46 (L) 11/13/2021 0043   GFRAA 65 12/23/2019 1123    BNP    Component Value Date/Time   BNP 32.2 11/09/2021 1250    ProBNP    Component Value Date/Time   PROBNP 90 07/20/2020 1550   PROBNP 26.0 04/24/2019 0913    Specialty Problems       Pulmonary Problems   OSA (obstructive sleep apnea)          Allergic  rhinitis due to allergen   Allergic asthma   Mild intermittent asthma   Consolidation of left lower lobe of lung   Asthma in adult, mild intermittent, with acute exacerbation   Upper respiratory tract infection   Chest congestion   Chronic cough   Severe persistent asthma without complication (HCC)   Globus sensation   Acute sinusitis   Bronchiectasis with acute exacerbation (HCC)    Allergies  Allergen Reactions   Cymbalta [Duloxetine Hcl] Other (See Comments)    hallucinations   Glucosamine Other (See Comments)    Passed out   Zyrtec [Cetirizine] Other (See Comments)    Weird dreams.    Ibuprofen Other (See Comments)    Pt states Dr doesn't wont her to take it; not allergic, no NSAIDS    Immunization History  Administered Date(s) Administered   Fluad Quad(high Dose 65+) 10/16/2018, 11/10/2019, 09/27/2020, 10/24/2021   Fluad Trivalent(High Dose 65+) 12/04/2022   INFLUENZA, HIGH DOSE SEASONAL PF 10/09/2016, 10/04/2017, 12/04/2023   Influenza,inj,Quad PF,6+ Mos 10/25/2012, 09/17/2013, 09/21/2015   Influenza-Unspecified 12/12/2014   Moderna Covid-19 Vaccine Bivalent Booster 34yrs & up 01/04/2021   Moderna SARS-COV2 Booster Vaccination 06/21/2020   Moderna Sars-Covid-2 Vaccination 02/28/2019, 03/28/2019, 11/14/2019   Pfizer(Comirnaty)Fall Seasonal Vaccine 12 years and older 11/08/2021, 12/20/2022   Pneumococcal Conjugate-13 03/18/2015, 11/18/2021   Pneumococcal Polysaccharide-23 02/09/2011, 09/17/2013   Tdap 03/18/2015   Zoster Recombinant(Shingrix) 02/19/2021   Zoster, Live 09/17/2011    Past Medical History:  Diagnosis Date   Asthma    Bronchiectasis (HCC)    CHF (congestive heart failure) (HCC)    Chronic kidney disease    Chronic right flank pain 12/04/2022   Diabetes mellitus without complication (HCC)    Diastolic heart failure (HCC)    Echo EF 60/65% Heart monitor was normal   GERD (gastroesophageal reflux disease)    History of colon polyps     Hyperlipidemia    Hypertension    Macular edema, cystoid    OSA (obstructive sleep apnea) 09/17/2012   CPAP   Osteopenia    Osteoporosis  Plantar fasciitis 09/09/2019   Pneumonia    Sleep apnea    Syncope, vasovagal 04/16/2017    Tobacco History: Social History   Tobacco Use  Smoking Status Never   Passive exposure: Past  Smokeless Tobacco Never   Counseling given: Not Answered   Continue to not smoke  Outpatient Encounter Medications as of 01/01/2024  Medication Sig   albuterol  (VENTOLIN  HFA) 108 (90 Base) MCG/ACT inhaler Inhale 2 puffs into the lungs every 6 (six) hours as needed for wheezing or shortness of breath.   Cholecalciferol (VITAMIN D3) 50 MCG (2000 UT) TABS Take 1 tablet by mouth daily.   citalopram  (CELEXA ) 10 MG tablet TAKE 1 TABLET(10 MG) BY MOUTH DAILY   diltiazem  (CARDIZEM  CD) 180 MG 24 hr capsule Take 1 capsule (180 mg total) by mouth daily.   Dupilumab  (DUPIXENT ) 300 MG/2ML SOAJ Inject 300 mg into the skin every 14 (fourteen) days.   famotidine  (PEPCID ) 40 MG tablet Take 1 tablet (40 mg total) by mouth daily.   fluconazole  (DIFLUCAN ) 100 MG tablet Take 2 tablets (200 mg total) by mouth daily for 1 day, THEN 1 tablet (100 mg total) daily for 6 days.   fluticasone  (FLONASE ) 50 MCG/ACT nasal spray SHAKE LIQUID AND USE 2 SPRAYS IN EACH NOSTRIL DAILY   furosemide  (LASIX ) 20 MG tablet Take 1 tablet (20 mg total) by mouth daily. Patient needs to make an appointment for further refills. 1st attempt.   montelukast  (SINGULAIR ) 10 MG tablet Take 1 tablet (10 mg total) by mouth at bedtime.   NON FORMULARY CPAP at bedtime   olmesartan  (BENICAR ) 20 MG tablet Take 10 mg by mouth daily.   omega-3 acid ethyl esters (LOVAZA ) 1 g capsule TAKE 2 CAPSULES BY MOUTH TWICE DAILY   pantoprazole  (PROTONIX ) 40 MG tablet Take 1 tablet (40 mg total) by mouth daily.   Polyethyl Glycol-Propyl Glycol (SYSTANE OP) Place 1 drop into both eyes 4 (four) times daily.   polyethylene glycol  (MIRALAX  / GLYCOLAX ) 17 g packet Take 17 g by mouth daily.   rosuvastatin  (CRESTOR ) 40 MG tablet TAKE 1 TABLET(40 MG) BY MOUTH DAILY   senna (SENOKOT) 8.6 MG tablet Take 1 tablet by mouth daily.   Tiotropium Bromide-Olodaterol (STIOLTO RESPIMAT ) 2.5-2.5 MCG/ACT AERS Inhale 2 puffs into the lungs daily.   Finerenone  (KERENDIA ) 10 MG TABS Take 1 tablet (10 mg total) by mouth daily. (Patient not taking: Reported on 01/01/2024)   Facility-Administered Encounter Medications as of 01/01/2024  Medication   [START ON 05/11/2024] denosumab  (PROLIA ) injection 60 mg     Review of Systems  Review of Systems  N/a  Physical Exam  BP 105/73   Pulse 72   Temp 97.6 F (36.4 C)   Ht 5' 2 (1.575 m) Comment: Per pt  Wt 188 lb 6.4 oz (85.5 kg)   SpO2 96% Comment: RA  BMI 34.46 kg/m   Wt Readings from Last 5 Encounters:  01/01/24 188 lb 6.4 oz (85.5 kg)  12/06/23 191 lb (86.6 kg)  12/04/23 187 lb 6.4 oz (85 kg)  11/13/23 188 lb (85.3 kg)  11/05/23 191 lb (86.6 kg)    BMI Readings from Last 5 Encounters:  01/01/24 34.46 kg/m  12/06/23 36.09 kg/m  12/04/23 34.28 kg/m  11/13/23 35.52 kg/m  11/05/23 36.09 kg/m     Physical Exam General: In chair no distress eyes: EOMI Neck: No JVD Pulmonary: moving good air, clear, normal work of breathing Cardiovascular: Warm Mouth: Thin white film on tongue  Assessment & Plan:   Severe persistent asthma/chronic bronchitis: Prolonged bronchitis symptoms fall 2025.  Now improved.  Continue Stiolto.  Intolerant of other inhalers due to sore throat, dry mouth, increased cough.  Particular DPI.  Must stay with Stiolto.  Has failed multiple inhalers.  Continue Dupixent .  GERD: With esophagitis on EGD.  Gastritis.  Small hiatal hernia.  Continue therapy via GI.  Certainly a large contributor to her chronic cough.  Persistent thrush: Not improved despite nystatin course x 2.  Unclear cause, not on ICS.  Would not expect Stiolto or albuterol  to  cause thrush.  Fluconazole  course prescribed today.   Return in about 6 months (around 07/01/2024) for f/u Dr. Annella.   Donnice JONELLE Annella, MD 01/01/2024

## 2024-01-14 ENCOUNTER — Telehealth: Payer: Self-pay | Admitting: Cardiology

## 2024-01-14 ENCOUNTER — Other Ambulatory Visit: Payer: Self-pay | Admitting: Pulmonary Disease

## 2024-01-14 ENCOUNTER — Encounter: Payer: Self-pay | Admitting: Pulmonary Disease

## 2024-01-14 DIAGNOSIS — J4550 Severe persistent asthma, uncomplicated: Secondary | ICD-10-CM

## 2024-01-14 NOTE — Telephone Encounter (Unsigned)
 Copied from CRM #[id number]. Topic: Clinical - Medication Refill >> Jan 14, 2024  3:56 PM Whitney O wrote: Medication: Dupilumab  (DUPIXENT ) 300 MG/2ML SOAJ  Has the patient contacted their pharmacy? Yes but has no refills  (Agent: If no, request that the patient contact the pharmacy for the refill. If patient does not wish to contact the pharmacy document the reason why and proceed with request.) (Agent: If yes, when and what did the pharmacy advise?)  This is the patient's preferred pharmacy:  [facility] - [place], [place] - [address] [place] [address] [phone] Phone number [phone] Fax number [phone]   Is this the correct pharmacy for this prescription? Yes If no, delete pharmacy and type the correct one.   Has the prescription been filled recently? No  Is the patient out of the medication? Yes  Has the patient been seen for an appointment in the last year OR does the patient have an upcoming appointment? Yes 01/01/2024  Can we respond through MyChart? Yes  Agent: Please be advised that Rx refills may take up to 3 business days. We ask that you follow-up with your pharmacy.

## 2024-01-14 NOTE — Telephone Encounter (Signed)
 Patient wants a provider switch from Dr. Monetta to Dr. Kriste in [place].

## 2024-01-15 MED ORDER — DUPIXENT 300 MG/2ML ~~LOC~~ SOAJ: 300.0000 mg | SUBCUTANEOUS | 20 refills | Status: AC

## 2024-01-16 ENCOUNTER — Encounter: Payer: Self-pay | Admitting: Family Medicine

## 2024-01-22 ENCOUNTER — Telehealth: Payer: Self-pay | Admitting: Pharmacist

## 2024-01-22 ENCOUNTER — Other Ambulatory Visit (HOSPITAL_COMMUNITY): Payer: Self-pay

## 2024-01-22 NOTE — Telephone Encounter (Signed)
 Received a fax from  [employer] regarding an approval for DUPIXENT  patient assistance from 01/21/2024 to 01/15/2025. Approval letter sent to scan center.  Phone #: [phone] Fax #: [phone]  Sherry Pennant, PharmD, MPH, BCPS, CPP Clinical Pharmacist

## 2024-02-05 ENCOUNTER — Other Ambulatory Visit: Payer: Self-pay

## 2024-02-05 DIAGNOSIS — N1832 Chronic kidney disease, stage 3b: Secondary | ICD-10-CM

## 2024-02-05 DIAGNOSIS — E1159 Type 2 diabetes mellitus with other circulatory complications: Secondary | ICD-10-CM

## 2024-02-05 DIAGNOSIS — E782 Mixed hyperlipidemia: Secondary | ICD-10-CM

## 2024-02-06 ENCOUNTER — Other Ambulatory Visit

## 2024-02-06 DIAGNOSIS — I152 Hypertension secondary to endocrine disorders: Secondary | ICD-10-CM

## 2024-02-06 DIAGNOSIS — E782 Mixed hyperlipidemia: Secondary | ICD-10-CM

## 2024-02-06 DIAGNOSIS — N1832 Chronic kidney disease, stage 3b: Secondary | ICD-10-CM

## 2024-02-07 ENCOUNTER — Ambulatory Visit: Payer: Self-pay | Admitting: Family Medicine

## 2024-02-07 ENCOUNTER — Ambulatory Visit: Attending: Internal Medicine | Admitting: Internal Medicine

## 2024-02-07 ENCOUNTER — Encounter: Payer: Self-pay | Admitting: Internal Medicine

## 2024-02-07 VITALS — BP 106/70 | HR 63 | Ht 62.0 in | Wt 191.0 lb

## 2024-02-07 DIAGNOSIS — I2510 Atherosclerotic heart disease of native coronary artery without angina pectoris: Secondary | ICD-10-CM | POA: Insufficient documentation

## 2024-02-07 DIAGNOSIS — R931 Abnormal findings on diagnostic imaging of heart and coronary circulation: Secondary | ICD-10-CM | POA: Diagnosis present

## 2024-02-07 DIAGNOSIS — N183 Chronic kidney disease, stage 3 unspecified: Secondary | ICD-10-CM | POA: Diagnosis not present

## 2024-02-07 DIAGNOSIS — E782 Mixed hyperlipidemia: Secondary | ICD-10-CM | POA: Insufficient documentation

## 2024-02-07 DIAGNOSIS — R079 Chest pain, unspecified: Secondary | ICD-10-CM | POA: Insufficient documentation

## 2024-02-07 DIAGNOSIS — I110 Hypertensive heart disease with heart failure: Secondary | ICD-10-CM | POA: Diagnosis not present

## 2024-02-07 DIAGNOSIS — R0789 Other chest pain: Secondary | ICD-10-CM | POA: Diagnosis present

## 2024-02-07 DIAGNOSIS — G4733 Obstructive sleep apnea (adult) (pediatric): Secondary | ICD-10-CM | POA: Insufficient documentation

## 2024-02-07 DIAGNOSIS — I5032 Chronic diastolic (congestive) heart failure: Secondary | ICD-10-CM | POA: Diagnosis not present

## 2024-02-07 DIAGNOSIS — R55 Syncope and collapse: Secondary | ICD-10-CM | POA: Insufficient documentation

## 2024-02-07 LAB — CBC WITH DIFFERENTIAL/PLATELET
Basophils Absolute: 0.1 x10E3/uL (ref 0.0–0.2)
Basos: 1 %
EOS (ABSOLUTE): 0.3 x10E3/uL (ref 0.0–0.4)
Eos: 4 %
Hematocrit: 41 % (ref 34.0–46.6)
Hemoglobin: 13.7 g/dL (ref 11.1–15.9)
Immature Grans (Abs): 0 x10E3/uL (ref 0.0–0.1)
Immature Granulocytes: 0 %
Lymphocytes Absolute: 2.1 x10E3/uL (ref 0.7–3.1)
Lymphs: 29 %
MCH: 30.4 pg (ref 26.6–33.0)
MCHC: 33.4 g/dL (ref 31.5–35.7)
MCV: 91 fL (ref 79–97)
Monocytes Absolute: 0.6 x10E3/uL (ref 0.1–0.9)
Monocytes: 8 %
Neutrophils Absolute: 4.2 x10E3/uL (ref 1.4–7.0)
Neutrophils: 58 %
Platelets: 245 x10E3/uL (ref 150–450)
RBC: 4.5 x10E6/uL (ref 3.77–5.28)
RDW: 14.1 % (ref 11.7–15.4)
WBC: 7.3 x10E3/uL (ref 3.4–10.8)

## 2024-02-07 LAB — COMPREHENSIVE METABOLIC PANEL WITH GFR
ALT: 23 IU/L (ref 0–32)
AST: 23 IU/L (ref 0–40)
Albumin: 4.6 g/dL (ref 3.8–4.8)
Alkaline Phosphatase: 52 IU/L (ref 49–135)
BUN/Creatinine Ratio: 12 (ref 12–28)
BUN: 18 mg/dL (ref 8–27)
Bilirubin Total: 0.6 mg/dL (ref 0.0–1.2)
CO2: 22 mmol/L (ref 20–29)
Calcium: 9.2 mg/dL (ref 8.7–10.3)
Chloride: 101 mmol/L (ref 96–106)
Creatinine, Ser: 1.49 mg/dL — ABNORMAL HIGH (ref 0.57–1.00)
Globulin, Total: 2.6 g/dL (ref 1.5–4.5)
Glucose: 106 mg/dL — ABNORMAL HIGH (ref 70–99)
Potassium: 4.5 mmol/L (ref 3.5–5.2)
Sodium: 138 mmol/L (ref 134–144)
Total Protein: 7.2 g/dL (ref 6.0–8.5)
eGFR: 36 mL/min/1.73 — ABNORMAL LOW

## 2024-02-07 LAB — LIPID PANEL
Chol/HDL Ratio: 2.4 ratio (ref 0.0–4.4)
Cholesterol, Total: 149 mg/dL (ref 100–199)
HDL: 63 mg/dL
LDL Chol Calc (NIH): 68 mg/dL (ref 0–99)
Triglycerides: 98 mg/dL (ref 0–149)
VLDL Cholesterol Cal: 18 mg/dL (ref 5–40)

## 2024-02-07 LAB — MICROALBUMIN / CREATININE URINE RATIO
Creatinine, Urine: 21.2 mg/dL
Microalb/Creat Ratio: 195 mg/g{creat} — ABNORMAL HIGH (ref 0–29)
Microalbumin, Urine: 41.4 ug/mL

## 2024-02-07 NOTE — Patient Instructions (Addendum)
 Medication Instructions:   Your physician recommends that you continue on your current medications as directed. Please refer to the Current Medication list given to you today.   *If you need a refill on your cardiac medications before your next appointment, please call your pharmacy*   Lab Work:  NONE ORDERED  TODAY    If you have labs (blood work) drawn today and your tests are completely normal, you will receive your results only by: MyChart Message (if you have MyChart) OR A paper copy in the mail If you have any lab test that is abnormal or we need to change your treatment, we will call you to review the results.   Testing/Procedures: Your physician has requested that you have an echocardiogram. Echocardiography is a painless test that uses sound waves to create images of your heart. It provides your doctor with information about the size and shape of your heart and how well your hearts chambers and valves are working. This procedure takes approximately one hour. There are no restrictions for this procedure. Please do NOT wear cologne, perfume, aftershave, or lotions (deodorant is allowed). Please arrive 15 minutes prior to your appointment time.  Please note: We ask at that you not bring children with you during ultrasound (echo/ vascular) testing. Due to room size and safety concerns, children are not allowed in the ultrasound rooms during exams. Our front office staff cannot provide observation of children in our lobby area while testing is being conducted. An adult accompanying a patient to their appointment will only be allowed in the ultrasound room at the discretion of the ultrasound technician under special circumstances. We apologize for any inconvenience.   Non-Cardiac CT Angiography (CTA), is a special type of CT scan that uses a computer to produce multi-dimensional views of major blood vessels throughout the body. In CT angiography, a contrast material is injected through  an IV to help visualize the blood vessels    Follow-Up: At [facility], you and your health needs are our priority.  As part of our continuing mission to provide you with exceptional heart care, our providers are all part of one team.  This team includes your primary Cardiologist (physician) and Advanced Practice Providers or APPs (Physician Assistants and Nurse Practitioners) who all work together to provide you with the care you need, when you need it.  Your next appointment:      We recommend signing up for the patient portal called MyChart.  Sign up information is provided on this After Visit Summary.  MyChart is used to connect with patients for Virtual Visits (Telemedicine).  Patients are able to view lab/test results, encounter notes, upcoming appointments, etc.  Non-urgent messages can be sent to your provider as well.   To learn more about what you can do with MyChart, go to [email or web address].   Other Instructions   Your cardiac CT will be scheduled at one of the below locations:   [facility] [address] [place], [place] [address] [phone] (Severe contrast allergies only)  OR   [facility] [address] [place], [place] [address] [phone]  OR   [facility] [address] [place], [place] [address] [phone]  OR   [facility] [address]  [place], [place] [address]  OR   [facility] [address] [place], [place] [phone]  If scheduled at [facility], please arrive at the [facility] and Children's  Entrance (Entrance C2) of [facility] 30 minutes prior to test start time. You can use the FREE valet parking offered at entrance C (encouraged to control the heart rate for the test)  Proceed to the [facility] Radiology Department (first floor) to check-in and test prep.  All radiology patients and guests should use  entrance C2 at [facility], accessed from [facility], even though the hospital's physical address listed is [address]  If scheduled at the Heart and Vascular Tower at [address], please enter the parking lot using the [address] entrance and use the FREE valet service at the patient drop-off area. Enter the building and check-in with registration on the main floor.  If scheduled at [facility], please arrive to the Heart and Vascular Center 15 mins early for check-in and test prep.  There is spacious parking and easy access to the radiology department from the [facility] Heart and Vascular entrance. Please enter here and check-in with the desk attendant.   If scheduled at [facility], please arrive 30 minutes early for check-in and test prep.  Please follow these instructions carefully (unless otherwise directed):  An IV will be required for this test and Nitroglycerin  will be given.    On the Night Before the Test: Be sure to Drink plenty of water. Do not consume any caffeinated/decaffeinated beverages or chocolate 12 hours prior to your test. Do not take any antihistamines 12 hours prior to your test. Drink plenty of water until 1 hour prior to the test. Do not eat any food 1 hour prior to test. You may take your regular medications prior to the test.  If you take Furosemide /Hydrochlorothiazide/Spironolactone/Chlorthalidone, please HOLD on the morning of the test. Patients who wear a continuous glucose monitor MUST remove the device prior to scanning. FEMALES- please wear underwire-free bra if available, avoid dresses & tight clothing   After the Test: Drink plenty of water. After receiving IV contrast, you may experience a mild flushed feeling. This is normal. On occasion, you may experience a mild rash up to 24 hours after the test. This is not dangerous. If this occurs, you can take Benadryl 25 mg, Zyrtec,  Claritin , or Allegra and increase your fluid intake. (Patients taking Tikosyn should avoid Benadryl, and may take Zyrtec, Claritin , or Allegra) If you experience trouble breathing, this can be serious. If it is severe call 911 IMMEDIATELY. If it is mild, please call our office.  We will call to schedule your test 2-4 weeks out understanding that some insurance companies will need an authorization prior to the service being performed.   For more information and frequently asked questions, please visit our website : [email or web address]  For non-scheduling related questions, please contact the cardiac imaging nurse navigator should you have any questions/concerns: Cardiac Imaging Nurse Navigators Direct Office Dial: [phone]   For scheduling needs, including cancellations and rescheduling, please call Brittany, [phone].

## 2024-02-07 NOTE — Progress Notes (Signed)
 " Cardiology Office Note:  .   Date:  02/07/2024  ID:  Diana Hendrix, DOB Feb 07, 1945, MRN [id number] PCP: Sherre Clapper, MD  [facility] Providers Cardiologist:  None    History of Present Illness: .     Discussed the use of AI scribe software for clinical note transcription with the patient, who gave verbal consent to proceed.  History of Present Illness Diana Hendrix Diana Hendrix is a 79 year old female with severe persistent asthma and chronic bronchitis who presents with chest tightness. She was referred by Dr. Annella for evaluation of chest tightness.  Chest tightness - Chest tightness present for over one year, described as a pressure sensation sometimes radiating to the back - Symptoms are worse in the mornings upon waking and improve with movement - Albuterol  provides some relief, though effectiveness is uncertain - Chest tightness has improved over the past four to five days but persists - No pain during physical activities such as climbing stairs or exercising at the [facility]  Pulmonary symptoms and history - Severe persistent asthma and chronic bronchitis - History of recurrent pneumonia, with onset of chest symptoms associated with these episodes - Persistent cough and hoarseness, especially after talking for extended periods - Treated for asthma and bronchiectasis  Gastrointestinal symptoms - History of GERD with esophagitis and small hiatal hernia - Persistent cough and throat discomfort, especially after talking for extended periods - Regurgitation and throat discomfort led to resumption of acid reflux medications (Prilosec and Pepcid )  Cardiovascular and other medical history - History of vasovagal syncope with T12 compression fracture in 2023 - Hypertension with chronic diastolic heart failure - Chronic kidney disease stage 3 - Hyperlipidemia - Obstructive sleep apnea       Chest tightness  Minimally elevated coronary calcium  score in 2024 of 44 with  nonobstructive plaque in LAD  Mild CAD noted on CT scan Asthma Chronic bronchiectasis Esophagitis    ROS: Remaining review of systems negative  Studies Reviewed: SABRA   EKG Interpretation Date/Time:  Thursday February 07 2024 11:09:45 EST Ventricular Rate:  63 PR Interval:  190 QRS Duration:  68 QT Interval:  388 QTC Calculation: 397 R Axis:   5  Text Interpretation: Normal sinus rhythm Possible Inferior infarct , age undetermined Cannot rule out Anterior infarct , age undetermined When compared with ECG of 16-Apr-2017 19:27, Inferior infarct NOW PRESENT Confirmed by [name] [phone]) on 02/07/2024 11:12:12 AM    Results Labs Lipid panel (11/09/2023): Total cholesterol 133, HDL 52, triglycerides 103, LDL 62 Hemoglobin A1c (11/09/2023): 6.1  Radiology Coronary CTA (01/26/2022): Calcium  score 44 at 48th percentile with minimal calcified plaque in left anterior descending artery  Diagnostic Echocardiogram (11/10/2021): Ejection fraction 60-65%, grade 1 diastolic dysfunction, mildly elevated pulmonary pressures EGD (12/06/2023): Small hiatal hernia, gastritis, gastric polyp Stress test (Lexiscan ) (2021): Stomach cramps and presyncope during Lexiscan , reversed with aminophylline , incomplete study Risk Assessment/Calculations:             Physical Exam:   VS:  BP 106/70 (BP Location: Left Arm)   Pulse 63   Ht 5' 2 (1.575 m)   Wt 191 lb (86.6 kg)   SpO2 97%   BMI 34.93 kg/m    Wt Readings from Last 3 Encounters:  02/07/24 191 lb (86.6 kg)  01/01/24 188 lb 6.4 oz (85.5 kg)  12/06/23 191 lb (86.6 kg)    GEN: Well nourished, well developed in no acute distress NECK: No JVD; CARDIAC:  RRR, no murmurs, no  rubs, no gallops RESPIRATORY:  Clear to auscultation without rales, wheezing or rhonchi  ABDOMEN: Soft, non-tender, non-distended EXTREMITIES:  No edema; No deformity   ASSESSMENT AND PLAN: .    Assessment and Plan Assessment & Plan Evaluation of chronic chest  tightness Chronic chest tightness with radiation to the back, worse in the morning, does not occur with exertion and able to exercise at the [facility].  Had adverse reaction to Lexiscan  with faint feeling several years ago. Coronary CTA in 2024 showed minimal calcified plaque. Cardiac origin chest pain unlikely more likely GI (esophagitis) and/or pulmonary related (recurrent pneumonia and T12 compression fracture) however she does have an inferior infarct pattern on EKG which is new. - Ordered echocardiogram to assess cardiac function. - Ordered repeat coronary CTA to evaluate for coronary artery disease.  Can take home diltiazem  prior.  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 Blood pressure well-controlled.  Mild coronary artery disease Minimal calcified plaque on coronary CTA. No significant symptoms of acute coronary syndrome. Family history of heart disease. - Ordered repeat coronary CTA to evaluate for progression of coronary artery disease.  Stage 3 chronic kidney disease Chronic kidney disease stage 3.  Mixed hyperlipidemia Managed with rosuvastatin . Lipid levels well-controlled. - Continue rosuvastatin  40 mg daily.  History of vasovagal syncope              Follow up: Likely 1 year pending above workup  Signed, [name], DO  02/07/2024 11:32 AM    [facility] "

## 2024-02-13 ENCOUNTER — Encounter: Payer: Self-pay | Admitting: Family Medicine

## 2024-02-13 ENCOUNTER — Ambulatory Visit: Admitting: Family Medicine

## 2024-02-13 ENCOUNTER — Encounter (HOSPITAL_COMMUNITY): Payer: Self-pay

## 2024-02-13 VITALS — BP 114/62 | HR 76 | Temp 97.2°F | Resp 16 | Ht 62.0 in | Wt 188.0 lb

## 2024-02-13 DIAGNOSIS — Z6834 Body mass index (BMI) 34.0-34.9, adult: Secondary | ICD-10-CM

## 2024-02-13 DIAGNOSIS — N1832 Chronic kidney disease, stage 3b: Secondary | ICD-10-CM | POA: Diagnosis not present

## 2024-02-13 DIAGNOSIS — E6609 Other obesity due to excess calories: Secondary | ICD-10-CM

## 2024-02-13 DIAGNOSIS — E1159 Type 2 diabetes mellitus with other circulatory complications: Secondary | ICD-10-CM | POA: Diagnosis not present

## 2024-02-13 DIAGNOSIS — E66811 Obesity, class 1: Secondary | ICD-10-CM | POA: Diagnosis not present

## 2024-02-13 DIAGNOSIS — J4550 Severe persistent asthma, uncomplicated: Secondary | ICD-10-CM

## 2024-02-13 DIAGNOSIS — E782 Mixed hyperlipidemia: Secondary | ICD-10-CM

## 2024-02-13 DIAGNOSIS — F330 Major depressive disorder, recurrent, mild: Secondary | ICD-10-CM

## 2024-02-13 DIAGNOSIS — K219 Gastro-esophageal reflux disease without esophagitis: Secondary | ICD-10-CM | POA: Diagnosis not present

## 2024-02-13 DIAGNOSIS — I152 Hypertension secondary to endocrine disorders: Secondary | ICD-10-CM

## 2024-02-13 DIAGNOSIS — E113213 Type 2 diabetes mellitus with mild nonproliferative diabetic retinopathy with macular edema, bilateral: Secondary | ICD-10-CM | POA: Insufficient documentation

## 2024-02-13 MED ORDER — DILTIAZEM HCL ER COATED BEADS 180 MG PO CP24: 180.0000 mg | ORAL_CAPSULE | Freq: Every day | ORAL | 1 refills | Status: AC

## 2024-02-13 MED ORDER — OLMESARTAN MEDOXOMIL 5 MG PO TABS: 10.0000 mg | ORAL_TABLET | Freq: Every day | ORAL | 1 refills | Status: AC

## 2024-02-13 MED ORDER — FAMOTIDINE 40 MG PO TABS: 40.0000 mg | ORAL_TABLET | Freq: Every day | ORAL | 1 refills | Status: AC

## 2024-02-13 NOTE — Assessment & Plan Note (Deleted)
 Diana Hendrix

## 2024-02-13 NOTE — Assessment & Plan Note (Addendum)
 Asthma exacerbated by chlorine cleaner exposure, controlled with Singulair  and Stiolto. - Continue Singulair  and Stiolto.

## 2024-02-13 NOTE — Assessment & Plan Note (Addendum)
 Recommend continue to work on eating healthy diet and exercise.

## 2024-02-13 NOTE — Assessment & Plan Note (Addendum)
 Blood pressure well-controlled with olmesartan  and diltiazem . - Continue olmesartan  5 mg 2 daily. - Continue diltiazem  180 mg daily.  Hemoglobin A1c is 6.1. No hyperglycemia symptoms. Not checking blood sugars. - Check feet daily for sores.  Orders:   Comprehensive metabolic panel with GFR   TSH   Hemoglobin A1c   diltiazem  (CARDIZEM  CD) 180 MG 24 hr capsule; Take 1 capsule (180 mg total) by mouth daily.   olmesartan  (BENICAR ) 5 MG tablet; Take 2 tablets (10 mg total) by mouth daily.

## 2024-02-13 NOTE — Progress Notes (Signed)
 "  Subjective:  Patient ID: Diana Hendrix, female    DOB: 1945/12/07  Age: 79 y.o. MRN: [id number]  Chief Complaint  Patient presents with   Medical Management of Chronic Issues    HPI: Discussed the use of AI scribe software for clinical note transcription with the patient, who gave verbal consent to proceed.  History of Present Illness Diana Hendrix is a 79 year old female with asthma and gastroesophageal reflux disease who presents with chest tightness and gastrointestinal symptoms.  Respiratory symptoms and asthma - Chest tightness following exposure to chlorine cleaner, with symptoms now returned to baseline - Persistent cough, described as a 'creepy cough' on one side of the chest - No fever, chills, sweats, sore throat, or nasal congestion - Facial pressure present - Asthma managed with Singulair  and Stiolto  Gastrointestinal symptoms and gerd - Persistent cough attributed to acid reflux - Endoscopy revealed gastritis, esophagitis, and a small hiatal hernia - Non-cancerous polyps removed from stomach - Initial regimen of two pantoprazole  and one Pepcid  daily; reduction in dosage led to throat discomfort and burping - Resumed one pantoprazole  and one Pepcid  daily, resulting in improvement of symptoms, though burping and belching persist  Cardiac evaluation - EKG demonstrated changes - Heart CT and echocardiogram scheduled - History of adverse reaction to Lexiscan  stress test  Renal function abnormalities - History of kidney function abnormalities and proteinuria under monitoring - Bubbly and green-tinted urine and she read that this was associated with proteinuria  Hypertension and hyperlipidemia management - On olmesartan , Lasix , diltiazem , fish oil, and rosuvastatin  for blood pressure and lipid control  Mood and sleep disturbances - On citalopram  for mood - Low energy levels - Improved sleep with melatonin  Physical activity - Engages in regular  water aerobics and Silver Sneakers classes - Outdoor activities reduced due to cold weather       02/13/2024    3:05 PM 11/13/2023    1:42 PM 08/06/2023    2:37 PM 05/23/2023    9:03 AM 05/03/2023    8:51 AM  Depression screen PHQ 2/9  Decreased Interest 0 0 1 0 0  Down, Depressed, Hopeless 0 0 0 0 0  PHQ - 2 Score 0 0 1 0 0  Altered sleeping 0 0 1  0  Tired, decreased energy 0 0 0  0  Change in appetite 0 0 0  0  Feeling bad or failure about yourself  0 0 0  0  Trouble concentrating 0 0 0  0  Moving slowly or fidgety/restless 0 0 0  0  Suicidal thoughts 0 0 0  0  PHQ-9 Score 0 0  2   0   Difficult doing work/chores Not difficult at all Not difficult at all Not difficult at all Not difficult at all Not difficult at all     Data saved with a previous flowsheet row definition        09/19/2023    9:42 AM  Fall Risk   Falls in the past year? 0  Number falls in past yr: 0  Injury with Fall? 0   Risk for fall due to : No Fall Risks  Follow up Falls evaluation completed     Data saved with a previous flowsheet row definition    Patient Care Team: Sherre Clapper, MD as PCP - General (Family Medicine) Waylan Cain, MD as Consulting Physician (Ophthalmology) Monetta Redell PARAS, MD as Consulting Physician (Cardiology) Charlanne Groom, MD as Consulting Physician (Gastroenterology)  Hunsucker, Donnice SAUNDERS, MD as Consulting Physician (Pulmonary Disease)   Review of Systems  Constitutional:  Positive for fatigue. Negative for chills and fever.  HENT:  Negative for congestion, ear pain and sore throat.   Respiratory:  Positive for cough and chest tightness. Negative for shortness of breath.   Cardiovascular:  Negative for chest pain.  Gastrointestinal:  Negative for abdominal pain, constipation, diarrhea, nausea and vomiting.  Genitourinary:  Negative for dysuria and urgency.  Musculoskeletal:  Negative for arthralgias and myalgias.  Skin:  Negative for rash.  Neurological:  Negative for  dizziness and headaches.  Psychiatric/Behavioral:  Negative for dysphoric mood. The patient is not nervous/anxious.     Medications Ordered Prior to Encounter[1] Past Medical History:  Diagnosis Date   Asthma    Bronchiectasis (HCC)    CHF (congestive heart failure) (HCC)    Chronic kidney disease    Chronic right flank pain 12/04/2022   Diabetes mellitus without complication (HCC)    Diastolic heart failure (HCC)    Echo EF 60/65% Heart monitor was normal   GERD (gastroesophageal reflux disease)    History of colon polyps    Hyperlipidemia    Hypertension    Macular edema, cystoid    OSA (obstructive sleep apnea) 09/17/2012   CPAP   Osteopenia    Osteoporosis    Plantar fasciitis 09/09/2019   Pneumonia    Sleep apnea    Syncope, vasovagal 04/16/2017   Past Surgical History:  Procedure Laterality Date   CESAREAN SECTION     COLONOSCOPY  10/29/2013   Colonic polyp status post polypectomy. Mild sigmoid diverticulosis. Small internal hemorrhoids   FOOT NEUROMA SURGERY Bilateral    s/p surgery in both feet   MENISCUS REPAIR Left 09/13/2016   MENISCUS REPAIR Right 2019   SHOULDER ARTHROSCOPY Left    arthroscopy.    TUBAL LIGATION      Family History  Problem Relation Age of Onset   Hyperlipidemia Mother    Heart disease Mother    Hypertension Mother    Atrial fibrillation Mother    CAD Mother    Congestive Heart Failure Mother    CVA Father    Hypertension Brother    Heart disease Maternal Grandmother    Heart disease Maternal Grandfather    Hypertension Son    Thyroid  disease Daughter    Colon cancer Neg Hx    Esophageal cancer Neg Hx    Social History   Socioeconomic History   Marital status: Married    Spouse name: Engineer, Production   Number of children: 2   Years of education: Not on file   Highest education level: Associate degree: academic program  Occupational History   Occupation: retired  Tobacco Use   Smoking status: Never    Passive exposure: Past    Smokeless tobacco: Never  Vaping Use   Vaping status: Never Used  Substance and Sexual Activity   Alcohol use: No   Drug use: No   Sexual activity: Not Currently  Other Topics Concern   Not on file  Social History Narrative   Lives with husband   Right handed   Caffeine: 1 cup of tea and 1 pepsi in the AM   Social Drivers of Health   Tobacco Use: Low Risk (02/13/2024)   Patient History    Smoking Tobacco Use: Never    Smokeless Tobacco Use: Never    Passive Exposure: Past  Financial Resource Strain: Low Risk (11/11/2023)   Overall Financial  Resource Strain (CARDIA)    Difficulty of Paying Living Expenses: Not hard at all  Food Insecurity: No Food Insecurity (11/11/2023)   Epic    Worried About Programme Researcher, Broadcasting/film/video in the Last Year: Never true    Ran Out of Food in the Last Year: Never true  Transportation Needs: No Transportation Needs (11/11/2023)   Epic    Lack of Transportation (Medical): No    Lack of Transportation (Non-Medical): No  Physical Activity: Sufficiently Active (11/11/2023)   Exercise Vital Sign    Days of Exercise per Week: 3 days    Minutes of Exercise per Session: 60 min  Stress: Stress Concern Present (11/11/2023)   [employer] of Occupational Health - Occupational Stress Questionnaire    Feeling of Stress: Rather much  Social Connections: Socially Integrated (11/11/2023)   Social Connection and Isolation Panel    Frequency of Communication with Friends and Family: Three times a week    Frequency of Social Gatherings with Friends and Family: Once a week    Attends Religious Services: 1 to 4 times per year    Active Member of Clubs or Organizations: Yes    Attends Banker Meetings: More than 4 times per year    Marital Status: Married  Depression (PHQ2-9): Low Risk (02/13/2024)   Depression (PHQ2-9)    PHQ-2 Score: 0  Alcohol Screen: Low Risk (05/05/2022)   Alcohol Screen    Last Alcohol Screening Score (AUDIT): 0  Housing: Low  Risk (11/13/2023)   Epic    Unable to Pay for Housing in the Last Year: No    Number of Times Moved in the Last Year: 0    Homeless in the Last Year: No  Utilities: Not At Risk (05/23/2023)   AHC Utilities    Threatened with loss of utilities: No  Health Literacy: Adequate Health Literacy (08/25/2022)   B1300 Health Literacy    Frequency of need for help with medical instructions: Never    Objective:  BP 114/62   Pulse 76   Temp (!) 97.2 F (36.2 C)   Resp 16   Ht 5' 2 (1.575 m)   Wt 188 lb (85.3 kg)   SpO2 96%   BMI 34.39 kg/m      02/15/2024   10:35 AM 02/15/2024   10:05 AM 02/13/2024    3:00 PM  BP/Weight  Systolic BP 125 123 114  Diastolic BP 72 65 62  Wt. (Lbs)   188  BMI   34.39 kg/m2    Physical Exam Vitals reviewed.  Constitutional:      Appearance: Normal appearance.  HENT:     Right Ear: Tympanic membrane, ear canal and external ear normal.     Left Ear: Tympanic membrane, ear canal and external ear normal.     Nose: Nose normal.     Mouth/Throat:     Pharynx: Oropharynx is clear.  Cardiovascular:     Rate and Rhythm: Normal rate and regular rhythm.     Heart sounds: Normal heart sounds. No murmur heard. Pulmonary:     Effort: Pulmonary effort is normal. No respiratory distress.     Breath sounds: Normal breath sounds.  Lymphadenopathy:     Cervical: No cervical adenopathy.  Neurological:     Mental Status: She is alert and oriented to person, place, and time.  Psychiatric:        Mood and Affect: Mood normal.        Behavior: Behavior normal.  Lab Results  Component Value Date   WBC 7.3 02/06/2024   HGB 13.7 02/06/2024   HCT 41.0 02/06/2024   PLT 245 02/06/2024   GLUCOSE 99 02/13/2024   CHOL 149 02/06/2024   TRIG 98 02/06/2024   HDL 63 02/06/2024   LDLCALC 68 02/06/2024   ALT 21 02/13/2024   AST 25 02/13/2024   NA 137 02/13/2024   K 4.0 02/13/2024   CL 100 02/13/2024   CREATININE 1.61 (H) 02/13/2024   BUN 21 02/13/2024    CO2 20 02/13/2024   TSH 1.630 02/13/2024   INR 1.1 11/09/2021   HGBA1C 6.1 (H) 02/13/2024    Results for orders placed or performed in visit on 02/13/24  Comprehensive metabolic panel with GFR   Collection Time: 02/13/24  3:42 PM  Result Value Ref Range   Glucose 99 70 - 99 mg/dL   BUN 21 8 - 27 mg/dL   Creatinine, Ser 8.38 (H) 0.57 - 1.00 mg/dL   eGFR 33 (L) >40 fO/fpw/8.26   BUN/Creatinine Ratio 13 12 - 28   Sodium 137 134 - 144 mmol/L   Potassium 4.0 3.5 - 5.2 mmol/L   Chloride 100 96 - 106 mmol/L   CO2 20 20 - 29 mmol/L   Calcium  9.9 8.7 - 10.3 mg/dL   Total Protein 6.9 6.0 - 8.5 g/dL   Albumin 4.4 3.8 - 4.8 g/dL   Globulin, Total 2.5 1.5 - 4.5 g/dL   Bilirubin Total 0.4 0.0 - 1.2 mg/dL   Alkaline Phosphatase 51 49 - 135 IU/L   AST 25 0 - 40 IU/L   ALT 21 0 - 32 IU/L  TSH   Collection Time: 02/13/24  3:42 PM  Result Value Ref Range   TSH 1.630 0.450 - 4.500 uIU/mL  Hemoglobin A1c   Collection Time: 02/13/24  3:42 PM  Result Value Ref Range   Hgb A1c MFr Bld 6.1 (H) 4.8 - 5.6 %   Est. average glucose Bld gHb Est-mCnc 128 mg/dL  .  Assessment & Plan:   Assessment & Plan Severe persistent asthma without complication (HCC) Asthma exacerbated by chlorine cleaner exposure, controlled with Singulair  and Stiolto. - Continue Singulair  and Stiolto.  Class 1 obesity due to excess calories with serious comorbidity and body mass index (BMI) of 34.0 to 34.9 in adult Recommend continue to work on eating healthy diet and exercise.  Hypertension associated with diabetes (HCC) Blood pressure well-controlled with olmesartan  and diltiazem . - Continue olmesartan  5 mg 2 daily. - Continue diltiazem  180 mg daily.  Hemoglobin A1c is 6.1. No hyperglycemia symptoms. Not checking blood sugars. - Check feet daily for sores.  Orders:   Comprehensive metabolic panel with GFR   TSH   Hemoglobin A1c   diltiazem  (CARDIZEM  CD) 180 MG 24 hr capsule; Take 1 capsule (180 mg total) by mouth  daily.   olmesartan  (BENICAR ) 5 MG tablet; Take 2 tablets (10 mg total) by mouth daily.   Stage 3b chronic kidney disease (HCC) Recent labs show decreased proteinuria. Nephrologist advised against certain medications due to side effects. - Rechecked kidney function today. - Continue follow-up with nephrologist in June.    Mild recurrent major depression Reports low energy but denies anxiety or depression. Sleep improved with melatonin. - Continue citalopram  10 mg daily.    Mixed hyperlipidemia Managed with Lovaza  and rosuvastatin . Recent cholesterol levels are good. - Continue Lovaza  and rosuvastatin . Orders:   TSH  Gastroesophageal reflux disease without esophagitis GERD with esophagitis, gastritis, and hiatal hernia.  Symptoms improved with pantoprazole  and Pepcid . Concerns about calcium  absorption with pantoprazole . - Continue pantoprazole  and Pepcid  as tolerated. - Monitor symptoms and adjust medication as needed. Orders:   famotidine  (PEPCID ) 40 MG tablet; Take 1 tablet (40 mg total) by mouth daily.    Body mass index is 34.39 kg/m.    Meds ordered this encounter  Medications   famotidine  (PEPCID ) 40 MG tablet    Sig: Take 1 tablet (40 mg total) by mouth daily.    Dispense:  90 tablet    Refill:  1   diltiazem  (CARDIZEM  CD) 180 MG 24 hr capsule    Sig: Take 1 capsule (180 mg total) by mouth daily.    Dispense:  90 capsule    Refill:  1   olmesartan  (BENICAR ) 5 MG tablet    Sig: Take 2 tablets (10 mg total) by mouth daily.    Dispense:  180 tablet    Refill:  1    Orders Placed This Encounter  Procedures   Comprehensive metabolic panel with GFR   TSH   Hemoglobin A1c     Follow-up: Return in about 3 months (around 05/13/2024) for chronic follow up.  An After Visit Summary was printed and given to the patient.  Diana Free, MD [facility] [phone]     [1]  Current Outpatient Medications on File Prior to Visit  Medication Sig Dispense  Refill   albuterol  (VENTOLIN  HFA) 108 (90 Base) MCG/ACT inhaler Inhale 2 puffs into the lungs every 6 (six) hours as needed for wheezing or shortness of breath. 16 g 3   Cholecalciferol (VITAMIN D3) 50 MCG (2000 UT) TABS Take 1 tablet by mouth daily.     citalopram  (CELEXA ) 10 MG tablet TAKE 1 TABLET(10 MG) BY MOUTH DAILY 90 tablet 1   Dupilumab  (DUPIXENT ) 300 MG/2ML SOAJ Inject 300 mg into the skin every 14 (fourteen) days. 2 mL 20   fluticasone  (FLONASE ) 50 MCG/ACT nasal spray SHAKE LIQUID AND USE 2 SPRAYS IN EACH NOSTRIL DAILY 16 g 6   furosemide  (LASIX ) 20 MG tablet Take 1 tablet (20 mg total) by mouth daily. Patient needs to make an appointment for further refills. 1st attempt. 90 tablet 3   montelukast  (SINGULAIR ) 10 MG tablet Take 1 tablet (10 mg total) by mouth at bedtime. 90 tablet 3   NON FORMULARY CPAP at bedtime     omega-3 acid ethyl esters (LOVAZA ) 1 g capsule TAKE 2 CAPSULES BY MOUTH TWICE DAILY 360 capsule 2   pantoprazole  (PROTONIX ) 40 MG tablet Take 1 tablet (40 mg total) by mouth daily.     Polyethyl Glycol-Propyl Glycol (SYSTANE OP) Place 1 drop into both eyes 4 (four) times daily.     polyethylene glycol (MIRALAX  / GLYCOLAX ) 17 g packet Take 17 g by mouth daily.     rosuvastatin  (CRESTOR ) 40 MG tablet TAKE 1 TABLET(40 MG) BY MOUTH DAILY 90 tablet 1   senna (SENOKOT) 8.6 MG tablet Take 1 tablet by mouth daily.     Tiotropium Bromide-Olodaterol (STIOLTO RESPIMAT ) 2.5-2.5 MCG/ACT AERS Inhale 2 puffs into the lungs daily. 1 each 11   Current Facility-Administered Medications on File Prior to Visit  Medication Dose Route Frequency Provider Last Rate Last Admin   [START ON 05/11/2024] denosumab  (PROLIA ) injection 60 mg  60 mg Subcutaneous Once Hendrix Abigail, MD       "

## 2024-02-13 NOTE — Assessment & Plan Note (Addendum)
 Recent labs show decreased proteinuria. Nephrologist advised against certain medications due to side effects. - Rechecked kidney function today. - Continue follow-up with nephrologist in June.

## 2024-02-14 ENCOUNTER — Ambulatory Visit: Payer: Self-pay | Admitting: Family Medicine

## 2024-02-14 LAB — COMPREHENSIVE METABOLIC PANEL WITH GFR
ALT: 21 [IU]/L (ref 0–32)
AST: 25 [IU]/L (ref 0–40)
Albumin: 4.4 g/dL (ref 3.8–4.8)
Alkaline Phosphatase: 51 [IU]/L (ref 49–135)
BUN/Creatinine Ratio: 13 (ref 12–28)
BUN: 21 mg/dL (ref 8–27)
Bilirubin Total: 0.4 mg/dL (ref 0.0–1.2)
CO2: 20 mmol/L (ref 20–29)
Calcium: 9.9 mg/dL (ref 8.7–10.3)
Chloride: 100 mmol/L (ref 96–106)
Creatinine, Ser: 1.61 mg/dL — ABNORMAL HIGH (ref 0.57–1.00)
Globulin, Total: 2.5 g/dL (ref 1.5–4.5)
Glucose: 99 mg/dL (ref 70–99)
Potassium: 4 mmol/L (ref 3.5–5.2)
Sodium: 137 mmol/L (ref 134–144)
Total Protein: 6.9 g/dL (ref 6.0–8.5)
eGFR: 33 mL/min/{1.73_m2} — ABNORMAL LOW

## 2024-02-14 LAB — TSH: TSH: 1.63 u[IU]/mL (ref 0.450–4.500)

## 2024-02-14 LAB — HEMOGLOBIN A1C
Est. average glucose Bld gHb Est-mCnc: 128 mg/dL
Hgb A1c MFr Bld: 6.1 % — ABNORMAL HIGH (ref 4.8–5.6)

## 2024-02-15 ENCOUNTER — Ambulatory Visit (HOSPITAL_COMMUNITY)
Admission: RE | Admit: 2024-02-15 | Discharge: 2024-02-15 | Disposition: A | Source: Ambulatory Visit | Attending: Cardiovascular Disease | Admitting: Cardiovascular Disease

## 2024-02-15 ENCOUNTER — Other Ambulatory Visit: Payer: Self-pay | Admitting: Cardiovascular Disease

## 2024-02-15 ENCOUNTER — Ambulatory Visit (HOSPITAL_COMMUNITY)
Admission: RE | Admit: 2024-02-15 | Discharge: 2024-02-15 | Disposition: A | Source: Ambulatory Visit | Attending: Cardiovascular Disease

## 2024-02-15 DIAGNOSIS — I2510 Atherosclerotic heart disease of native coronary artery without angina pectoris: Secondary | ICD-10-CM | POA: Diagnosis present

## 2024-02-15 DIAGNOSIS — R931 Abnormal findings on diagnostic imaging of heart and coronary circulation: Secondary | ICD-10-CM | POA: Insufficient documentation

## 2024-02-15 MED ORDER — IOHEXOL 350 MG/ML SOLN
100.0000 mL | Freq: Once | INTRAVENOUS | Status: AC | PRN
  Administered 2024-02-15: 100 mL via INTRAVENOUS

## 2024-02-15 MED ORDER — NITROGLYCERIN 0.4 MG SL SUBL
0.8000 mg | SUBLINGUAL_TABLET | Freq: Once | SUBLINGUAL | Status: AC
  Administered 2024-02-15: 0.8 mg via SUBLINGUAL

## 2024-02-17 NOTE — Assessment & Plan Note (Signed)
 Reports low energy but denies anxiety or depression. Sleep improved with melatonin. - Continue citalopram  10 mg daily.

## 2024-02-17 NOTE — Assessment & Plan Note (Signed)
 GERD with esophagitis, gastritis, and hiatal hernia. Symptoms improved with pantoprazole  and Pepcid . Concerns about calcium  absorption with pantoprazole . - Continue pantoprazole  and Pepcid  as tolerated. - Monitor symptoms and adjust medication as needed. Orders:   famotidine  (PEPCID ) 40 MG tablet; Take 1 tablet (40 mg total) by mouth daily.

## 2024-02-17 NOTE — Assessment & Plan Note (Signed)
 Managed with Lovaza  and rosuvastatin . Recent cholesterol levels are good. - Continue Lovaza  and rosuvastatin . Orders:   TSH

## 2024-02-18 ENCOUNTER — Ambulatory Visit: Payer: Self-pay | Admitting: Internal Medicine

## 2024-02-18 NOTE — Progress Notes (Signed)
 IMPRESSION: 1. Coronary calcium  score of 61.5. This was 48th percentile for age-, sex, and race-matched controls. 2. Normal coronary origin with right dominance. 3. There is minimal (<25%) plaque in the LAD, D1, and OM1. 4.  Aortic atherosclerosis.  Chest pain is unlikely due to CAD. Would recommend continued risk factor modification and pursue alternate reasons of chest pain as previously discussed.

## 2024-02-21 ENCOUNTER — Encounter: Payer: Self-pay | Admitting: Pulmonary Disease

## 2024-02-21 NOTE — Telephone Encounter (Signed)
 Please advise is anything needed a prior auth or anything for stiolto,patient is stating no longer covered

## 2024-03-11 ENCOUNTER — Ambulatory Visit (HOSPITAL_COMMUNITY)

## 2024-03-14 ENCOUNTER — Ambulatory Visit (HOSPITAL_COMMUNITY)

## 2024-03-18 ENCOUNTER — Ambulatory Visit: Admitting: Family Medicine

## 2024-05-13 ENCOUNTER — Other Ambulatory Visit

## 2024-05-20 ENCOUNTER — Ambulatory Visit: Admitting: Family Medicine

## 2024-07-01 ENCOUNTER — Ambulatory Visit: Admitting: Pulmonary Disease
